# Patient Record
Sex: Female | Born: 1960 | Race: White | Hispanic: No | Marital: Single | Smoking: Never smoker
Health system: Southern US, Community
[De-identification: ages and names within clinical notes are randomized; demographics above are authoritative.]

## PROBLEM LIST (undated history)

## (undated) DIAGNOSIS — F32A Depression, unspecified: Secondary | ICD-10-CM

## (undated) DIAGNOSIS — E039 Hypothyroidism, unspecified: Secondary | ICD-10-CM

## (undated) DIAGNOSIS — K219 Gastro-esophageal reflux disease without esophagitis: Secondary | ICD-10-CM

## (undated) DIAGNOSIS — F329 Major depressive disorder, single episode, unspecified: Secondary | ICD-10-CM

## (undated) DIAGNOSIS — G43909 Migraine, unspecified, not intractable, without status migrainosus: Secondary | ICD-10-CM

## (undated) DIAGNOSIS — M1990 Unspecified osteoarthritis, unspecified site: Secondary | ICD-10-CM

## (undated) DIAGNOSIS — F419 Anxiety disorder, unspecified: Secondary | ICD-10-CM

## (undated) DIAGNOSIS — I10 Essential (primary) hypertension: Secondary | ICD-10-CM

## (undated) DIAGNOSIS — E785 Hyperlipidemia, unspecified: Secondary | ICD-10-CM

## (undated) DIAGNOSIS — E119 Type 2 diabetes mellitus without complications: Secondary | ICD-10-CM

## (undated) DIAGNOSIS — E079 Disorder of thyroid, unspecified: Secondary | ICD-10-CM

## (undated) HISTORY — DX: Depression, unspecified: F32.A

## (undated) HISTORY — DX: Anxiety disorder, unspecified: F41.9

## (undated) HISTORY — DX: Disorder of thyroid, unspecified: E07.9

## (undated) HISTORY — DX: Hyperlipidemia, unspecified: E78.5

## (undated) HISTORY — DX: Major depressive disorder, single episode, unspecified: F32.9

## (undated) HISTORY — DX: Unspecified osteoarthritis, unspecified site: M19.90

## (undated) HISTORY — PX: NO PAST SURGERIES: SHX2092

## (undated) HISTORY — DX: Gastro-esophageal reflux disease without esophagitis: K21.9

---
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
## 1983-10-22 HISTORY — PX: BREAST CYST EXCISION: SHX579

## 2005-12-19 ENCOUNTER — Inpatient Hospital Stay (HOSPITAL_COMMUNITY): Admission: AD | Admit: 2005-12-19 | Discharge: 2005-12-19 | Payer: Self-pay | Admitting: Obstetrics & Gynecology

## 2005-12-27 ENCOUNTER — Ambulatory Visit: Payer: Self-pay | Admitting: *Deleted

## 2006-04-04 ENCOUNTER — Ambulatory Visit: Payer: Self-pay | Admitting: Family Medicine

## 2007-08-31 ENCOUNTER — Encounter (INDEPENDENT_AMBULATORY_CARE_PROVIDER_SITE_OTHER): Payer: Self-pay | Admitting: Family Medicine

## 2007-08-31 ENCOUNTER — Ambulatory Visit: Payer: Self-pay | Admitting: Internal Medicine

## 2007-08-31 LAB — CONVERTED CEMR LAB
AST: 11 units/L (ref 0–37)
Albumin: 4.9 g/dL (ref 3.5–5.2)
Alkaline Phosphatase: 59 units/L (ref 39–117)
BUN: 15 mg/dL (ref 6–23)
Basophils Relative: 1 % (ref 0–1)
Eosinophils Absolute: 0.1 10*3/uL — ABNORMAL LOW (ref 0.2–0.7)
Eosinophils Relative: 1 % (ref 0–5)
Glucose, Bld: 102 mg/dL — ABNORMAL HIGH (ref 70–99)
HCT: 45 % (ref 36.0–46.0)
HDL: 35 mg/dL — ABNORMAL LOW (ref >39)
LDL Cholesterol: 179 mg/dL — ABNORMAL HIGH (ref 0–99)
MCHC: 33.6 g/dL (ref 30.0–36.0)
MCV: 95.1 fL (ref 78.0–100.0)
Neutrophils Relative %: 67 % (ref 43–77)
Platelets: 286 10*3/uL (ref 150–400)
Potassium: 4.4 meq/L (ref 3.5–5.3)
Sodium: 139 meq/L (ref 135–145)
TSH: 1.698 microintl units/mL (ref 0.350–5.50)
Total Bilirubin: 0.3 mg/dL (ref 0.3–1.2)
Total Protein: 8.6 g/dL — ABNORMAL HIGH (ref 6.0–8.3)
Triglycerides: 202 mg/dL — ABNORMAL HIGH (ref <150)
VLDL: 40 mg/dL (ref 0–40)

## 2007-09-03 ENCOUNTER — Ambulatory Visit (HOSPITAL_COMMUNITY): Admission: RE | Admit: 2007-09-03 | Discharge: 2007-09-03 | Payer: Self-pay | Admitting: Family Medicine

## 2007-09-08 ENCOUNTER — Ambulatory Visit (HOSPITAL_COMMUNITY): Admission: RE | Admit: 2007-09-08 | Discharge: 2007-09-08 | Payer: Self-pay | Admitting: Family Medicine

## 2007-09-25 ENCOUNTER — Ambulatory Visit: Payer: Self-pay | Admitting: *Deleted

## 2007-09-25 ENCOUNTER — Ambulatory Visit: Payer: Self-pay | Admitting: Family Medicine

## 2007-12-03 ENCOUNTER — Ambulatory Visit: Payer: Self-pay | Admitting: Family Medicine

## 2007-12-03 LAB — CONVERTED CEMR LAB
ALT: <8 units/L (ref 0–35)
AST: 9 units/L (ref 0–37)
Alkaline Phosphatase: 43 units/L (ref 39–117)
BUN: 11 mg/dL (ref 6–23)
Chloride: 103 meq/L (ref 96–112)
Creatinine, Ser: 0.68 mg/dL (ref 0.40–1.20)
HDL: 33 mg/dL — ABNORMAL LOW (ref >39)
LDL Cholesterol: 153 mg/dL — ABNORMAL HIGH (ref 0–99)
Total CHOL/HDL Ratio: 7.2
VLDL: 53 mg/dL — ABNORMAL HIGH (ref 0–40)

## 2008-06-23 ENCOUNTER — Emergency Department (HOSPITAL_COMMUNITY): Admission: EM | Admit: 2008-06-23 | Discharge: 2008-06-23 | Payer: Self-pay | Admitting: Emergency Medicine

## 2010-05-21 ENCOUNTER — Ambulatory Visit: Payer: Self-pay | Admitting: Internal Medicine

## 2010-09-03 ENCOUNTER — Ambulatory Visit: Payer: Self-pay | Admitting: Nurse Practitioner

## 2010-09-03 ENCOUNTER — Inpatient Hospital Stay (HOSPITAL_COMMUNITY): Admission: AD | Admit: 2010-09-03 | Discharge: 2010-09-03 | Payer: Self-pay | Admitting: Obstetrics & Gynecology

## 2010-09-27 ENCOUNTER — Ambulatory Visit: Payer: Self-pay | Admitting: Obstetrics and Gynecology

## 2010-09-27 ENCOUNTER — Other Ambulatory Visit
Admission: RE | Admit: 2010-09-27 | Discharge: 2010-09-27 | Payer: Self-pay | Source: Home / Self Care | Admitting: Obstetrics and Gynecology

## 2010-09-27 ENCOUNTER — Encounter (INDEPENDENT_AMBULATORY_CARE_PROVIDER_SITE_OTHER): Payer: Self-pay | Admitting: *Deleted

## 2010-09-27 LAB — CONVERTED CEMR LAB
FSH: 11.8 milliintl units/mL
Platelets: 294 10*3/uL (ref 150–400)
RBC: 4.05 M/uL (ref 3.87–5.11)
WBC: 4.8 10*3/uL (ref 4.0–10.5)

## 2010-10-18 ENCOUNTER — Ambulatory Visit: Payer: Self-pay | Admitting: Obstetrics & Gynecology

## 2011-01-01 LAB — CBC
HCT: 32.8 % — ABNORMAL LOW (ref 36.0–46.0)
MCH: 29.3 pg (ref 26.0–34.0)
MCV: 87.7 fL (ref 78.0–100.0)
RDW: 17.4 % — ABNORMAL HIGH (ref 11.5–15.5)
WBC: 4.3 10*3/uL (ref 4.0–10.5)

## 2011-01-01 LAB — URINALYSIS, ROUTINE W REFLEX MICROSCOPIC
Bilirubin Urine: NEGATIVE
Leukocytes, UA: NEGATIVE
Nitrite: NEGATIVE
Specific Gravity, Urine: >1.03 — ABNORMAL HIGH (ref 1.005–1.030)
Urobilinogen, UA: 1 mg/dL (ref 0.0–1.0)
pH: 6 (ref 5.0–8.0)

## 2011-01-01 LAB — WET PREP, GENITAL
Trich, Wet Prep: NONE SEEN
Yeast Wet Prep HPF POC: NONE SEEN

## 2011-01-01 LAB — URINE MICROSCOPIC-ADD ON

## 2011-02-06 ENCOUNTER — Emergency Department (HOSPITAL_COMMUNITY)
Admission: EM | Admit: 2011-02-06 | Discharge: 2011-02-06 | Disposition: A | Discharge status: Home / Self Care | Payer: Self-pay | Attending: Emergency Medicine | Admitting: Emergency Medicine

## 2011-02-06 DIAGNOSIS — T192XXA Foreign body in vulva and vagina, initial encounter: Secondary | ICD-10-CM | POA: Insufficient documentation

## 2011-02-06 DIAGNOSIS — Z79899 Other long term (current) drug therapy: Secondary | ICD-10-CM | POA: Insufficient documentation

## 2011-02-06 DIAGNOSIS — N898 Other specified noninflammatory disorders of vagina: Secondary | ICD-10-CM | POA: Insufficient documentation

## 2011-02-06 DIAGNOSIS — F3289 Other specified depressive episodes: Secondary | ICD-10-CM | POA: Insufficient documentation

## 2011-02-06 DIAGNOSIS — F329 Major depressive disorder, single episode, unspecified: Secondary | ICD-10-CM | POA: Insufficient documentation

## 2011-02-06 DIAGNOSIS — IMO0002 Reserved for concepts with insufficient information to code with codable children: Secondary | ICD-10-CM | POA: Insufficient documentation

## 2011-03-08 NOTE — Group Therapy Note (Signed)
[name], [name] NO.:  [id number]   MEDICAL RECORD NO.:  [id number]          PATIENT TYPE:  WOC   LOCATION:  [facility]                   FACILITY:  [facility]   PHYSICIAN:  Tinnie Gens, MD        DATE OF BIRTH:  1961-05-10   DATE OF SERVICE:  04/04/2006                                    CLINIC NOTE   CHIEF COMPLAINT:  Follow up vaginal bleeding.  Follow anemia.   SUBJECTIVE:  [name] is a 50 year old old patient seen in our clinic on  December 27, 2005, with a diagnosis of submucosal fibroid and vaginal bleeding  and severe iron-deficiency anemia.  At that point the patient was prescribed  Lo-Ovral 1 tablet p.o. daily for bleeding control.  The patient reports that  she has been having regular periods every month lasting 4 days with moderate  bleeding.  Her last menstrual period was March 24, 2006.  She did not report  any suprapubic or abdominal pain, no vaginal discharge.  Her level of energy  has improved.  No fatigue.  She has been also taking iron sulfate 1 p.o.  daily.   PHYSICAL EXAMINATION:  VITAL SIGNS:  Blood pressure 126/88, pulse 72,  temperature 97.4, respirations 20.  GENERAL:  Well-nourished female in no acute distress.  CARDIOVASCULAR:  S1, S2 normal, RRR, no murmurs, rubs or gallops.  CHEST:  Lungs clear to auscultation bilaterally.  ABDOMEN:  Soft, nontender, nondistended, positive bowel sounds.  No palpable  masses.  PELVIC:  Bimanual examination shows nontender cervical motion, no uterine  masses or adnexal masses palpable.   ASSESSMENT:  1.  Submucosal fibroid and menorrhagia, improved with birth control pills.      The patient will decide to continue same treatment.  2.  Iron-deficiency anemia.  The patient currently on iron supplement.  We      will check CBC today and adjust medication accordingly.     ______________________________  Tressia Danas, M.D.    ______________________________  Tinnie Gens, MD    /MEDQ  D:  04/04/2006   T:  04/04/2006  Job:  [id number]

## 2011-03-08 NOTE — Group Therapy Note (Signed)
[name], [name] NO.:  [id number]   MEDICAL RECORD NO.:  [id number]          PATIENT TYPE:  WOC   LOCATION:  [facility]                   FACILITY:  [facility]   PHYSICIAN:  Carolanne Grumbling, M.D.   DATE OF BIRTH:  10-Dec-1960   DATE OF SERVICE:                                    CLINIC NOTE   A 50 year old G2, P1-0-1-1 presents for MAU followup for heavy vaginal  bleeding.  The patient reports that she had Depo-Provera in November.  She  spotted in December and then on January 1 when she started having her period  it became very heavy, and she actually fainted per her report on January 11  and passed approximately about two 6-7 cm blood clots.  She presented then  to [facility] in March for her next Depo-Provera shot, and her  hemoglobin was 6.9 and she was sent to the MAU for transfusion.  While being  evaluated there, an ultrasound was done and it found a submucosal fibroid  measuring 2.1 x 1.7 x 2.  While she was there, the patient was also  diagnosed with bacterial vaginosis and treated with Flagyl.  As far as her  treatment for her bleeding, she was given a shot of Depo-Lupron and told  that her other option would be oral contraceptives.  Today, the patient  presents and actually thought that she was here for a surgical procedure to  remove the fibroid.   PAST MEDICAL HISTORY:  Negative for hypertension and heart disease.   PAST SURGICAL HISTORY:  She has had 2 cysts removed from her breasts, and a  dilation and curettage in 1996 for heavy vaginal bleeding.   MEDICATIONS:  Iron sulfate.   GYNECOLOGIC HISTORY:  She has been pregnant 2 times with 1 living child and  1 abortion.  Pap smear was in November and was normal.   SOCIAL HISTORY:  The patient lives with her boyfriend, her son and her  boyfriend's daughter.  She does not smoke, drink or use drugs.   PHYSICAL EXAMINATION:  VITAL SIGNS:  Weight 146.6, height 5 feet 9 inches,  blood pressure 130/80  and pulse 80.  GENERAL:  A well-developed, well-nourished female in no apparent distress.  CARDIOVASCULAR:  Regular rate and rhythm.  LUNGS:  Clear to auscultation bilaterally.  ABDOMEN:  Soft, nontender and positive bowel sounds.  EXTREMITIES:  No clubbing, cyanosis or edema.   IMPRESSION:  1.  Submucosal fibroid measuring 2.1 x 1.7 x 2 cm.  2.  Anemia.  Last hemoglobin was 6.5 with an MCV of 70.   PLAN:  The case was discussed with Dr. Mia [name], who recommended medical  treatment at this point.  She is being started on Lo/Ovral 1 tab p.o. daily.  If she has trouble with heavy bleeding, she is to return to the clinic.  In  the  meantime, she should stay on her ferrous sulfate.  Other options for  treatment include using an IUD or repeating her Depo-Provera and treating  her with oral estrogen.           ______________________________  Patricia Ana  Mayford [name], M.D.     TW/MEDQ  D:  12/27/2005  T:  12/28/2005  Job:  [id number]

## 2011-07-24 LAB — COMPREHENSIVE METABOLIC PANEL
AST: 15
Albumin: 3.8
Alkaline Phosphatase: 55
Chloride: 109
GFR calc Af Amer: >60
Potassium: 3.7
Total Bilirubin: 0.6

## 2011-07-24 LAB — URINALYSIS, ROUTINE W REFLEX MICROSCOPIC
Bilirubin Urine: NEGATIVE
Ketones, ur: NEGATIVE
Nitrite: NEGATIVE
Protein, ur: NEGATIVE
Urobilinogen, UA: 0.2

## 2011-07-24 LAB — DIFFERENTIAL
Basophils Absolute: 0
Basophils Relative: 1
Eosinophils Absolute: 0.1
Eosinophils Relative: 2
Monocytes Absolute: 0.4

## 2011-07-24 LAB — CBC
HCT: 35.5 — ABNORMAL LOW
Platelets: 288
RBC: 4.11
WBC: 5.1

## 2011-07-24 LAB — URINE MICROSCOPIC-ADD ON

## 2013-05-28 ENCOUNTER — Emergency Department (INDEPENDENT_AMBULATORY_CARE_PROVIDER_SITE_OTHER)
Admission: EM | Admit: 2013-05-28 | Discharge: 2013-05-28 | Disposition: A | Discharge status: Home / Self Care | Payer: Self-pay | Source: Home / Self Care | Attending: Family Medicine | Admitting: Family Medicine

## 2013-05-28 ENCOUNTER — Encounter (HOSPITAL_COMMUNITY): Payer: Self-pay

## 2013-05-28 DIAGNOSIS — R51 Headache: Secondary | ICD-10-CM

## 2013-05-28 MED ORDER — CYCLOBENZAPRINE HCL 5 MG PO TABS: 5.0000 mg | ORAL_TABLET | Freq: Three times a day (TID) | ORAL | Status: DC | PRN

## 2013-05-28 MED ORDER — KETOROLAC TROMETHAMINE 10 MG PO TABS: 10.0000 mg | ORAL_TABLET | Freq: Four times a day (QID) | ORAL | Status: DC | PRN

## 2013-05-28 NOTE — ED Provider Notes (Addendum)
  CSN: [id number]     Arrival date & time 05/28/13  4098 History     None    Chief Complaint  Patient presents with  . Headache   (Consider location/radiation/quality/duration/timing/severity/associated sxs/prior Treatment) Patient is a 52 y.o. female presenting with headaches. The history is provided by the patient.  Headache Pain location:  Occipital Quality:  Stabbing Radiates to:  Does not radiate Onset quality:  Gradual Duration:  2 days Progression:  Unchanged Chronicity:  Chronic Similar to prior headaches: yes   Relieved by:  Acetaminophen Ineffective treatments:  Acetaminophen Associated symptoms: no blurred vision, no dizziness, no pain, no fever, no nausea, no neck pain, no paresthesias, no tingling and no weakness     History reviewed. No pertinent past medical history. History reviewed. No pertinent past surgical history. History reviewed. No pertinent family history. History  Substance Use Topics  . Smoking status: Never Smoker   . Smokeless tobacco: Not on file  . Alcohol Use: No   OB History   Grav Para Term Preterm Abortions TAB SAB Ect Mult Living                 Review of Systems  Constitutional: Negative.  Negative for fever.  HENT: Negative for neck pain.   Eyes: Negative for blurred vision and pain.  Gastrointestinal: Negative for nausea.  Neurological: Positive for headaches. Negative for dizziness, syncope, speech difficulty, weakness and paresthesias.    Allergies  Review of patient's allergies indicates no known allergies.  Home Medications   Current Outpatient Rx  Name  Route  Sig  Dispense  Refill  . cyclobenzaprine (FLEXERIL) 5 MG tablet   Oral   Take 1 tablet (5 mg total) by mouth 3 (three) times daily as needed for muscle spasms.   30 tablet   0   . ketorolac (TORADOL) 10 MG tablet   Oral   Take 1 tablet (10 mg total) by mouth every 6 (six) hours as needed for pain.   20 tablet   0    BP 145/78  Pulse 96  Temp(Src) 98.3  F (36.8 C) (Oral)  Resp 16  SpO2 99%  LMP 04/23/2013 Physical Exam  Nursing note and vitals reviewed. Constitutional: She is oriented to person, place, and time. She appears well-developed and well-nourished.  HENT:  Head: Normocephalic.  Right Ear: External ear normal.  Left Ear: External ear normal.  Mouth/Throat: Oropharynx is clear and moist.  Eyes: Conjunctivae are normal. Pupils are equal, round, and reactive to light.  Neck: Normal range of motion. Neck supple.  Lymphadenopathy:    She has no cervical adenopathy.  Neurological: She is alert and oriented to person, place, and time. No cranial nerve deficit.  Skin: Skin is warm and dry.    ED Course   Procedures (including critical care time)  Labs Reviewed - No data to display No results found. 1. Headache disorder     MDM    Linna Hoff, MD 05/28/13 1191  Linna Hoff, MD 06/05/13 1003

## 2013-05-28 NOTE — ED Notes (Signed)
C/o she has had a migraine HA since 2012 , w/o relief from OTC medications; also wants testing for arthritis and menopause symptoms

## 2013-10-19 ENCOUNTER — Emergency Department (HOSPITAL_COMMUNITY)
Admission: EM | Admit: 2013-10-19 | Discharge: 2013-10-20 | Disposition: A | Discharge status: Home / Self Care | Payer: Self-pay | Attending: Emergency Medicine | Admitting: Emergency Medicine

## 2013-10-19 ENCOUNTER — Encounter (HOSPITAL_COMMUNITY): Payer: Self-pay | Admitting: Emergency Medicine

## 2013-10-19 ENCOUNTER — Emergency Department (HOSPITAL_COMMUNITY): Payer: Self-pay

## 2013-10-19 DIAGNOSIS — B9789 Other viral agents as the cause of diseases classified elsewhere: Secondary | ICD-10-CM

## 2013-10-19 DIAGNOSIS — Y939 Activity, unspecified: Secondary | ICD-10-CM | POA: Insufficient documentation

## 2013-10-19 DIAGNOSIS — J069 Acute upper respiratory infection, unspecified: Secondary | ICD-10-CM | POA: Insufficient documentation

## 2013-10-19 DIAGNOSIS — Z880 Allergy status to penicillin: Secondary | ICD-10-CM | POA: Insufficient documentation

## 2013-10-19 DIAGNOSIS — X503XXA Overexertion from repetitive movements, initial encounter: Secondary | ICD-10-CM | POA: Insufficient documentation

## 2013-10-19 DIAGNOSIS — I10 Essential (primary) hypertension: Secondary | ICD-10-CM | POA: Insufficient documentation

## 2013-10-19 DIAGNOSIS — Y929 Unspecified place or not applicable: Secondary | ICD-10-CM | POA: Insufficient documentation

## 2013-10-19 DIAGNOSIS — IMO0002 Reserved for concepts with insufficient information to code with codable children: Secondary | ICD-10-CM | POA: Insufficient documentation

## 2013-10-19 DIAGNOSIS — T148XXA Other injury of unspecified body region, initial encounter: Secondary | ICD-10-CM

## 2013-10-19 HISTORY — DX: Essential (primary) hypertension: I10

## 2013-10-19 NOTE — ED Notes (Signed)
Patient presents via EMS with a chief complaint of cough x 1 week with pain to her lungs when coughing. Patient denies fever, nausea/vomiting/diarrhea.

## 2013-10-20 ENCOUNTER — Emergency Department (HOSPITAL_COMMUNITY)
Admission: EM | Admit: 2013-10-20 | Discharge: 2013-10-20 | Disposition: A | Discharge status: Home / Self Care | Payer: Self-pay | Attending: Emergency Medicine | Admitting: Emergency Medicine

## 2013-10-20 ENCOUNTER — Encounter (HOSPITAL_COMMUNITY): Payer: Self-pay | Admitting: Emergency Medicine

## 2013-10-20 DIAGNOSIS — IMO0002 Reserved for concepts with insufficient information to code with codable children: Secondary | ICD-10-CM | POA: Insufficient documentation

## 2013-10-20 DIAGNOSIS — Z880 Allergy status to penicillin: Secondary | ICD-10-CM | POA: Insufficient documentation

## 2013-10-20 DIAGNOSIS — S298XXA Other specified injuries of thorax, initial encounter: Secondary | ICD-10-CM | POA: Insufficient documentation

## 2013-10-20 DIAGNOSIS — Y929 Unspecified place or not applicable: Secondary | ICD-10-CM | POA: Insufficient documentation

## 2013-10-20 DIAGNOSIS — R059 Cough, unspecified: Secondary | ICD-10-CM | POA: Insufficient documentation

## 2013-10-20 DIAGNOSIS — I10 Essential (primary) hypertension: Secondary | ICD-10-CM | POA: Insufficient documentation

## 2013-10-20 DIAGNOSIS — T148XXA Other injury of unspecified body region, initial encounter: Secondary | ICD-10-CM

## 2013-10-20 DIAGNOSIS — R05 Cough: Secondary | ICD-10-CM | POA: Insufficient documentation

## 2013-10-20 DIAGNOSIS — X58XXXA Exposure to other specified factors, initial encounter: Secondary | ICD-10-CM | POA: Insufficient documentation

## 2013-10-20 DIAGNOSIS — Y939 Activity, unspecified: Secondary | ICD-10-CM | POA: Insufficient documentation

## 2013-10-20 MED ORDER — BENZONATATE 100 MG PO CAPS
100.0000 mg | ORAL_CAPSULE | Freq: Once | ORAL | Status: AC
  Administered 2013-10-20: 100 mg via ORAL
  Filled 2013-10-20: qty 1

## 2013-10-20 MED ORDER — GUAIFENESIN ER 600 MG PO TB12: 1200.0000 mg | ORAL_TABLET | Freq: Two times a day (BID) | ORAL | Status: DC

## 2013-10-20 MED ORDER — HYDROCODONE-ACETAMINOPHEN 5-325 MG PO TABS
2.0000 | ORAL_TABLET | Freq: Once | ORAL | Status: AC
  Administered 2013-10-20: 2 via ORAL
  Filled 2013-10-20: qty 2

## 2013-10-20 MED ORDER — HYDROCODONE-ACETAMINOPHEN 5-325 MG PO TABS: 1.0000 | ORAL_TABLET | ORAL | Status: DC | PRN

## 2013-10-20 MED ORDER — CYCLOBENZAPRINE HCL 10 MG PO TABS
5.0000 mg | ORAL_TABLET | Freq: Once | ORAL | Status: AC
  Administered 2013-10-20: 5 mg via ORAL
  Filled 2013-10-20: qty 1

## 2013-10-20 MED ORDER — HYDROCOD POLST-CHLORPHEN POLST 10-8 MG/5ML PO LQCR
5.0000 mL | Freq: Once | ORAL | Status: AC
  Administered 2013-10-20: 5 mL via ORAL
  Filled 2013-10-20: qty 5

## 2013-10-20 NOTE — ED Provider Notes (Signed)
CSN: [id number]     Arrival date & time 10/19/13  2206 History   None    Chief Complaint  Patient presents with  . Cough   (Consider location/radiation/quality/duration/timing/severity/associated sxs/prior Treatment) HPI History provided by pt.   Pt has had cold symptoms, including rhinorrhea and cough, for the past 8 days.  Yesterday she developed severe pain in right mid-back when she coughed.  Pain has been constant since, but aggravated by movement and cough, and radiates around to right lower anterior chest.  Had subjective fever at onset of sx, but currently w/out fever as well as chills, headache, sore throat, SOB, body aches, generalized weakness.  Son w/ similar sx recently.  Has been taking flexeril for back pain w/out relief.   Past Medical History  Diagnosis Date  . Hypertension    History reviewed. No pertinent past surgical history. No family history on file. History  Substance Use Topics  . Smoking status: Never Smoker   . Smokeless tobacco: Not on file  . Alcohol Use: No   OB History   Grav Para Term Preterm Abortions TAB SAB Ect Mult Living                 Review of Systems  All other systems reviewed and are negative.    Allergies  Penicillins  Home Medications   Current Outpatient Rx  Name  Route  Sig  Dispense  Refill  . cyclobenzaprine (FLEXERIL) 5 MG tablet   Oral   Take 1 tablet (5 mg total) by mouth 3 (three) times daily as needed for muscle spasms.   30 tablet   0   . HYDROcodone-acetaminophen (NORCO/VICODIN) 5-325 MG per tablet   Oral   Take 1 tablet by mouth every 4 (four) hours as needed for moderate pain.   15 tablet   0    BP 171/92  Pulse 91  Temp(Src) 98.9 F (37.2 C) (Oral)  Resp 18  SpO2 97%  LMP 08/19/2013 Physical Exam  Nursing note and vitals reviewed. Constitutional: She is oriented to person, place, and time. She appears well-developed and well-nourished. No distress.  HENT:  Head: Normocephalic and atraumatic.   Posterior pharynx w/out erythema.  No tonsillar edema or exudate.  Uvula mid-line.  No trismus.    Eyes:  Normal appearance  Neck: Normal range of motion.  Cardiovascular: Normal rate and regular rhythm.   Pulmonary/Chest: Effort normal and breath sounds normal. No respiratory distress.  Pt does not appear uncomfortable w/ deep inspiration  Musculoskeletal: Normal range of motion.  Mild tenderness right thoracic soft tissues.  Pain in same location w/ passive ROM of RUE.    Lymphadenopathy:    She has no cervical adenopathy.  Neurological: She is alert and oriented to person, place, and time.  Skin: Skin is warm and dry. No rash noted.  Psychiatric: She has a normal mood and affect. Her behavior is normal.    ED Course  Procedures (including critical care time) Labs Review Labs Reviewed - No data to display Imaging Review Dg Chest 1 View  10/19/2013   CLINICAL DATA:  Persistent cough for 8 days.  EXAM: CHEST - 1 VIEW  COMPARISON:  None.  FINDINGS: The heart size and mediastinal contours are within normal limits. Both lungs are clear. The visualized skeletal structures are unremarkable.  IMPRESSION: No active disease.   Electronically Signed   By: Burman Nieves M.D.   On: 10/19/2013 23:01    EKG Interpretation   None  MDM   1. Muscle strain   2. Viral respiratory illness    52yo F presents w/ R thoracic back pain that started when she coughed yesterday.  Has had cough and rhinorrhea x 8 days.  CXR neg for pna.  Afebrile, no respiratory distress, nml breath sounds, back pain reproducible w/ palpation and ROM of RUE on exam.  Suspect muscle strain and viral URI.  Pt prescribed short course of vicodin for pain and cough suppression.  Recommended NSAID, heat/ice and rest as well.  Return precautions discussed. 12:42 AM     Otilio Miu, PA-C 10/20/13 [phone]

## 2013-10-20 NOTE — ED Provider Notes (Signed)
CSN: [id number]     Arrival date & time 10/20/13  2153 History   First MD Initiated Contact with Patient 10/20/13 2214     Chief Complaint  Patient presents with  . Back Pain  . Spasms   HPI  History provided by patient. Patient is a 52 year old female with history of hypertension who presents with worsened back and right chest wall pain. Patient has been having a cough for several days. She was seen yesterday in the emergency department with unremarkable chest x-ray. She was having some chest wall pains at that time related to coughing. Today she has had significant coughing fits and developed worsened pain and tightness along her right back and chest wall. She denies having any shortness of breath. Denies any fevers, chills or sweats. She was given prescriptions for pain medicine she has been unable to afford these at this time. She has not used any other treatment for symptoms. No other aggravating or alleviating factors. No other associated symptoms.    Past Medical History  Diagnosis Date  . Hypertension    History reviewed. No pertinent past surgical history. No family history on file. History  Substance Use Topics  . Smoking status: Never Smoker   . Smokeless tobacco: Not on file  . Alcohol Use: No   OB History   Grav Para Term Preterm Abortions TAB SAB Ect Mult Living                 Review of Systems  Constitutional: Negative for fever and chills.  Respiratory: Positive for cough. Negative for shortness of breath.   Cardiovascular: Positive for chest pain.  Musculoskeletal: Positive for back pain.  All other systems reviewed and are negative.    Allergies  Penicillins  Home Medications   Current Outpatient Rx  Name  Route  Sig  Dispense  Refill  . cyclobenzaprine (FLEXERIL) 5 MG tablet   Oral   Take 1 tablet (5 mg total) by mouth 3 (three) times daily as needed for muscle spasms.   30 tablet   0   . guaiFENesin (MUCINEX) 600 MG 12 hr tablet   Oral  Take 2 tablets (1,200 mg total) by mouth 2 (two) times daily.   40 tablet   0   . HYDROcodone-acetaminophen (NORCO/VICODIN) 5-325 MG per tablet   Oral   Take 1 tablet by mouth every 4 (four) hours as needed for moderate pain.   15 tablet   0    BP 158/86  Pulse 88  Temp(Src) 98.2 F (36.8 C) (Oral)  Resp 18  SpO2 99%  LMP 08/19/2013 Physical Exam  Nursing note and vitals reviewed. Constitutional: She is oriented to person, place, and time. She appears well-developed and well-nourished. No distress.  HENT:  Head: Normocephalic.  Cardiovascular: Normal rate and regular rhythm.   Pulmonary/Chest: Effort normal and breath sounds normal. No respiratory distress. She has no wheezes. She has no rales.      Tenderness over the right chest chest wall and back. No deformities. No swelling. Skin normal. No bruising. No crepitus.  Abdominal: Soft. There is no tenderness.  Neurological: She is alert and oriented to person, place, and time.  Skin: Skin is warm and dry. No rash noted.  Psychiatric: She has a normal mood and affect. Her behavior is normal.    ED Course  Procedures   Imaging Review Dg Chest 1 View  10/19/2013   CLINICAL DATA:  Persistent cough for 8 days.  EXAM: CHEST -  1 VIEW  COMPARISON:  None.  FINDINGS: The heart size and mediastinal contours are within normal limits. Both lungs are clear. The visualized skeletal structures are unremarkable.  IMPRESSION: No active disease.   Electronically Signed   By: Burman Nieves M.D.   On: 10/19/2013 23:01      MDM   1. Muscle strain   2. Cough        Angus Seller, PA-C 10/20/13 [id number]

## 2013-10-20 NOTE — ED Provider Notes (Signed)
Medical screening examination/treatment/procedure(s) were performed by non-physician practitioner and as supervising physician I was immediately available for consultation/collaboration.   Nilson Tabora, MD 10/20/13 0829 

## 2013-10-20 NOTE — ED Notes (Signed)
Pt was tx here last night for back pain and muscle spasms.  Pt has prescription that has not been filled b/c, per ems, they do not have money to buy the medicine.

## 2013-10-21 NOTE — ED Provider Notes (Signed)
Medical screening examination/treatment/procedure(s) were performed by non-physician practitioner and as supervising physician I was immediately available for consultation/collaboration.  EKG Interpretation   None         Shaquina Gillham M Annelisa Ryback, MD 10/21/13 0043 

## 2015-05-20 ENCOUNTER — Encounter (HOSPITAL_COMMUNITY): Payer: Self-pay | Admitting: Emergency Medicine

## 2015-05-20 ENCOUNTER — Emergency Department (HOSPITAL_COMMUNITY)
Admission: EM | Admit: 2015-05-20 | Discharge: 2015-05-20 | Disposition: A | Discharge status: Home / Self Care | Payer: Self-pay | Attending: Emergency Medicine | Admitting: Emergency Medicine

## 2015-05-20 ENCOUNTER — Emergency Department (HOSPITAL_COMMUNITY): Payer: Self-pay

## 2015-05-20 DIAGNOSIS — I10 Essential (primary) hypertension: Secondary | ICD-10-CM | POA: Insufficient documentation

## 2015-05-20 DIAGNOSIS — N939 Abnormal uterine and vaginal bleeding, unspecified: Secondary | ICD-10-CM | POA: Insufficient documentation

## 2015-05-20 DIAGNOSIS — Z880 Allergy status to penicillin: Secondary | ICD-10-CM | POA: Insufficient documentation

## 2015-05-20 LAB — URINALYSIS W MICROSCOPIC (NOT AT ARMC)
Bilirubin Urine: NEGATIVE
GLUCOSE, UA: NEGATIVE mg/dL
KETONES UR: NEGATIVE mg/dL
LEUKOCYTES UA: NEGATIVE
NITRITE: NEGATIVE
Protein, ur: NEGATIVE mg/dL
Specific Gravity, Urine: 1.011 (ref 1.005–1.030)
UROBILINOGEN UA: 0.2 mg/dL (ref 0.0–1.0)
pH: 7 (ref 5.0–8.0)

## 2015-05-20 LAB — WET PREP, GENITAL
TRICH WET PREP: NONE SEEN
WBC, Wet Prep HPF POC: NONE SEEN
Yeast Wet Prep HPF POC: NONE SEEN

## 2015-05-20 LAB — PREGNANCY, URINE: PREG TEST UR: NEGATIVE

## 2015-05-20 NOTE — ED Notes (Signed)
Pt. Stated I thought I was finished with my periods and then I started spotting and bleeding on July 14 and it gets worse and worse.

## 2015-05-20 NOTE — ED Provider Notes (Signed)
CSN: [id number]     Arrival date & time 05/20/15  1015 History   First MD Initiated Contact with Patient 05/20/15 1122     Chief Complaint  Patient presents with  . Vaginal Bleeding     (Consider location/radiation/quality/duration/timing/severity/associated sxs/prior Treatment) HPI [name] is a 54 y.o. female presenting to the emergency department today with complaint of vaginal bleeding.  States that her LMP was sometime in August 2015 and prior to that time had been having regular periods .  Beginning around July 14, pt states that she noticed some spotting.  Last weekend, patient reports that her bleeding became more heavy and states that she is going through approximately one pad per day.  Associated symptoms include some abdominal cramping that comes and goes that patient feels are similar to menstrual type cramping.  Patient denies passing any large clots with her bleeding.  Patient does not recall when her last pap smear was and does not currently follow with an Ob/Gyn.  She is not on any blood thinners, denies any overt bleeding from other sites.  She is afebrile, hemodynamically stable, not tachycardic.  Does report some fatigue lately but denies any chest pain, shortness of breath.  Past Medical History  Diagnosis Date  . Hypertension    History reviewed. No pertinent past surgical history. No family history on file. History  Substance Use Topics  . Smoking status: Never Smoker   . Smokeless tobacco: Not on file  . Alcohol Use: No   OB History    No data available     Review of Systems  Constitutional: Positive for fatigue. Negative for fever and chills.  Respiratory: Negative for cough and shortness of breath.   Cardiovascular: Negative for chest pain and leg swelling.  Gastrointestinal: Negative for nausea, vomiting and diarrhea.       Intermittment abdominal cramping  Genitourinary: Positive for vaginal bleeding. Negative for dysuria, flank pain and  difficulty urinating.  Neurological: Negative for dizziness, syncope and light-headedness.  All other systems reviewed and are negative.     Allergies  Penicillins  Home Medications   Prior to Admission medications   Medication Sig Start Date End Date Taking? Authorizing Provider  cyclobenzaprine (FLEXERIL) 5 MG tablet Take 1 tablet (5 mg total) by mouth 3 (three) times daily as needed for muscle spasms. 05/28/13   Billy Fischer, MD  guaiFENesin (MUCINEX) 600 MG 12 hr tablet Take 2 tablets (1,200 mg total) by mouth 2 (two) times daily. 10/20/13   Hazel Sams, PA-C  HYDROcodone-acetaminophen (NORCO/VICODIN) 5-325 MG per tablet Take 1 tablet by mouth every 4 (four) hours as needed for moderate pain. 10/20/13   Catherine Schinlever, PA-C   BP 143/78 mmHg  Pulse 84  Temp(Src) 98.7 F (37.1 C) (Oral)  Resp 18  Ht 5' 7.5" (1.715 m)  Wt 168 lb (76.204 kg)  BMI 25.91 kg/m2  SpO2 97% Physical Exam  Constitutional: She appears well-developed and well-nourished. No distress.  HENT:  Head: Normocephalic and atraumatic.  Eyes: EOM are normal.  Cardiovascular: Normal rate and regular rhythm.   Pulmonary/Chest: Effort normal and breath sounds normal.  Abdominal: Soft. There is no tenderness.  Genitourinary: Cervix exhibits no discharge. There is bleeding in the vagina. No vaginal discharge found.    ED Course  Procedures (including critical care time) Labs Review Labs Reviewed  WET PREP, GENITAL - Abnormal; Notable for the following:    Clue Cells Wet Prep HPF POC FEW (*)  All other components within normal limits  URINALYSIS W MICROSCOPIC - Abnormal; Notable for the following:    Hgb urine dipstick LARGE (*)    Squamous Epithelial / LPF FEW (*)    All other components within normal limits  PREGNANCY, URINE  RPR  HIV ANTIBODY (ROUTINE TESTING)  GC/CHLAMYDIA PROBE AMP ([place]) NOT AT [facility]    Imaging Review US Transvaginal Non-ob  05/20/2015   CLINICAL DATA:  Vaginal  bleeding for 2 weeks.  EXAM: TRANSABDOMINAL AND TRANSVAGINAL ULTRASOUND OF PELVIS  TECHNIQUE: Both transabdominal and transvaginal ultrasound examinations of the pelvis were performed. Transabdominal technique was performed for global imaging of the pelvis including uterus, ovaries, adnexal regions, and pelvic cul-de-sac. It was necessary to proceed with endovaginal exam following the transabdominal exam to visualize the uterus, endometrium, ovaries and adnexa .  COMPARISON:  09/03/2010  FINDINGS: Uterus  Measurements: 10.1 x 6.1 x 8.0 cm. Multiple uterine fibroids, the largest an exophytic fundal fibroid measuring up to 2.2 cm. There is an posterior submucosal 1.4 cm fibroid. Uterus is retroverted.  Endometrium  Thickness: 17 mm in thickness. Hypoechoic structure centrally within the endometrium measures 1.9 x 1.4 x 1.2 cm. This could reflect a pedunculated fibroid or polyp.  Right ovary  Measurements: 4.3 x 3.2 x 3.4 cm. Simple appearing 3.1 cm cyst. Complex cyst with internal echoes measures 2.3 cm, likely hemorrhagic follicle.  Left ovary  Measurements: 2.8 x 1.9 x 1.9 cm. Normal appearance/no adnexal mass.  Other findings  Trace free fluid in the pelvis  IMPRESSION: Multiple uterine fibroids. There is a 1.9 cm hypoechoic lesions centrally within the endometrium which could reflect a pedunculated fibroid or endometrial polyp.   Electronically Signed   By: Rolm Baptise M.D.   On: 05/20/2015 13:39   US Pelvis Complete  05/20/2015   CLINICAL DATA:  Vaginal bleeding for 2 weeks.  EXAM: TRANSABDOMINAL AND TRANSVAGINAL ULTRASOUND OF PELVIS  TECHNIQUE: Both transabdominal and transvaginal ultrasound examinations of the pelvis were performed. Transabdominal technique was performed for global imaging of the pelvis including uterus, ovaries, adnexal regions, and pelvic cul-de-sac. It was necessary to proceed with endovaginal exam following the transabdominal exam to visualize the uterus, endometrium, ovaries and adnexa  .  COMPARISON:  09/03/2010  FINDINGS: Uterus  Measurements: 10.1 x 6.1 x 8.0 cm. Multiple uterine fibroids, the largest an exophytic fundal fibroid measuring up to 2.2 cm. There is an posterior submucosal 1.4 cm fibroid. Uterus is retroverted.  Endometrium  Thickness: 17 mm in thickness. Hypoechoic structure centrally within the endometrium measures 1.9 x 1.4 x 1.2 cm. This could reflect a pedunculated fibroid or polyp.  Right ovary  Measurements: 4.3 x 3.2 x 3.4 cm. Simple appearing 3.1 cm cyst. Complex cyst with internal echoes measures 2.3 cm, likely hemorrhagic follicle.  Left ovary  Measurements: 2.8 x 1.9 x 1.9 cm. Normal appearance/no adnexal mass.  Other findings  Trace free fluid in the pelvis  IMPRESSION: Multiple uterine fibroids. There is a 1.9 cm hypoechoic lesions centrally within the endometrium which could reflect a pedunculated fibroid or endometrial polyp.   Electronically Signed   By: Rolm Baptise M.D.   On: 05/20/2015 13:39     EKG Interpretation None      MDM   Final diagnoses:  Vaginal bleeding   54 yo female with vaginal bleeding, LMP August 2015.  Pt reports spotting that started around July 14 with increased amount of bleeding up until her visit to the ED.  States going through  about one pad per day.  Denies any over blood loss from other sources.  Pt is hemodynamically stable and not tachycardic.  Pelvic exam performed with presence of blood in vaginal vault.  No other discharge noted.  Mild tenderness noted by patient on bimanual exam.  Will check urine pregnancy, RPR, GC/CT, wet prep, urinalysis, HIV, and pelvic ultrasound.  Urine pregnancy negative.  Urinalysis showing large amount blood, negative nitrites and leukocytes.  Pelvic ultrasound with multiple uterine fibroids.  Patient stable for discharge.  Will have patient follow up with Ob/gyn as an outpatient.     Jule Ser, DO 05/20/15 [place], MD 05/22/15 [id number]

## 2015-05-20 NOTE — ED Notes (Signed)
MD Linker at the bedside.  

## 2015-05-20 NOTE — Progress Notes (Signed)
CM spoke with pt who confirms self pay [facility] resident with no pcp.  CM discussed and provided written information for self pay pcps, discussed the importance of pcp vs EDP services for f/u care, www.needymeds.org, www.goodrx.com, discounted pharmacies and other [employer] such as [employer] [facility] [employer], affordable care act,  [place] med assist, financial assistance, self pay dental services, [place] med assist, DSS and  health department  Reviewed resources for [employer] self pay pcps like [facility], family medicine at [employer], [facility], [facility], local urgent care centers, [facility], [facility] family practice, general medical clinics, family services of the [place], [facility] urgent care plus others, medication resources, [facility] out patient pharmacies and housing Pt voiced understanding and appreciation of resources provided   Provided [facility] contact information Pt agreed to a referral Cm completed referral Pt to be contact by [facility] clinical liaison

## 2015-05-20 NOTE — Discharge Instructions (Signed)

## 2015-05-20 NOTE — ED Notes (Signed)
Patient getting dressed.

## 2015-05-20 NOTE — ED Notes (Signed)
Returned from U/S

## 2015-05-21 LAB — HIV ANTIBODY (ROUTINE TESTING W REFLEX): HIV Screen 4th Generation wRfx: NONREACTIVE

## 2015-05-21 LAB — RPR: RPR: NONREACTIVE

## 2015-05-22 LAB — GC/CHLAMYDIA PROBE AMP (~~LOC~~) NOT AT ARMC
CHLAMYDIA, DNA PROBE: NEGATIVE
NEISSERIA GONORRHEA: NEGATIVE

## 2015-08-18 ENCOUNTER — Ambulatory Visit: Payer: Self-pay | Attending: Internal Medicine

## 2015-09-06 ENCOUNTER — Ambulatory Visit: Payer: Self-pay

## 2015-09-18 ENCOUNTER — Ambulatory Visit: Payer: Self-pay | Admitting: Family Medicine

## 2015-09-28 ENCOUNTER — Ambulatory Visit (INDEPENDENT_AMBULATORY_CARE_PROVIDER_SITE_OTHER): Payer: Self-pay | Admitting: Family Medicine

## 2015-09-28 ENCOUNTER — Encounter: Payer: Self-pay | Admitting: Family Medicine

## 2015-09-28 VITALS — BP 158/91 | HR 99 | Temp 98.2°F | Ht 67.5 in | Wt 193.0 lb

## 2015-09-28 DIAGNOSIS — M5440 Lumbago with sciatica, unspecified side: Secondary | ICD-10-CM

## 2015-09-28 DIAGNOSIS — I10 Essential (primary) hypertension: Secondary | ICD-10-CM

## 2015-09-28 DIAGNOSIS — Z23 Encounter for immunization: Secondary | ICD-10-CM

## 2015-09-28 DIAGNOSIS — M545 Low back pain: Secondary | ICD-10-CM

## 2015-09-28 DIAGNOSIS — M5430 Sciatica, unspecified side: Secondary | ICD-10-CM

## 2015-09-28 LAB — CBC WITH DIFFERENTIAL/PLATELET
BASOS ABS: 0.1 10*3/uL (ref 0.0–0.1)
Basophils Relative: 1 % (ref 0–1)
EOS PCT: 1 % (ref 0–5)
Eosinophils Absolute: 0.1 10*3/uL (ref 0.0–0.7)
HCT: 43.5 % (ref 36.0–46.0)
Hemoglobin: 14.9 g/dL (ref 12.0–15.0)
LYMPHS ABS: 2.1 10*3/uL (ref 0.7–4.0)
Lymphocytes Relative: 32 % (ref 12–46)
MCH: 31.1 pg (ref 26.0–34.0)
MCHC: 34.3 g/dL (ref 30.0–36.0)
MCV: 90.8 fL (ref 78.0–100.0)
MPV: 9.6 fL (ref 8.6–12.4)
Monocytes Absolute: 0.4 10*3/uL (ref 0.1–1.0)
Monocytes Relative: 6 % (ref 3–12)
NEUTROS PCT: 60 % (ref 43–77)
Neutro Abs: 4 10*3/uL (ref 1.7–7.7)
PLATELETS: 275 10*3/uL (ref 150–400)
RBC: 4.79 MIL/uL (ref 3.87–5.11)
RDW: 14.2 % (ref 11.5–15.5)
WBC: 6.6 10*3/uL (ref 4.0–10.5)

## 2015-09-28 LAB — LIPID PANEL
CHOLESTEROL: 302 mg/dL — AB (ref 125–200)
HDL: 33 mg/dL — AB (ref >=46)
LDL CALC: 208 mg/dL — AB (ref <130)
TRIGLYCERIDES: 304 mg/dL — AB (ref <150)
Total CHOL/HDL Ratio: 9.2 Ratio — ABNORMAL HIGH (ref <=5.0)
VLDL: 61 mg/dL — AB (ref <30)

## 2015-09-28 LAB — COMPLETE METABOLIC PANEL WITH GFR
ALT: 26 U/L (ref 6–29)
AST: 19 U/L (ref 10–35)
Albumin: 4.1 g/dL (ref 3.6–5.1)
Alkaline Phosphatase: 75 U/L (ref 33–130)
BUN: 15 mg/dL (ref 7–25)
CALCIUM: 9.6 mg/dL (ref 8.6–10.4)
CO2: 26 mmol/L (ref 20–31)
CREATININE: 0.77 mg/dL (ref 0.50–1.05)
Chloride: 102 mmol/L (ref 98–110)
GFR, EST NON AFRICAN AMERICAN: 88 mL/min (ref >=60)
Glucose, Bld: 123 mg/dL — ABNORMAL HIGH (ref 65–99)
Potassium: 4.2 mmol/L (ref 3.5–5.3)
Sodium: 137 mmol/L (ref 135–146)
Total Bilirubin: 0.3 mg/dL (ref 0.2–1.2)
Total Protein: 8.4 g/dL — ABNORMAL HIGH (ref 6.1–8.1)

## 2015-09-28 LAB — TSH: TSH: 3.071 u[IU]/mL (ref 0.350–4.500)

## 2015-09-28 MED ORDER — HYDROCHLOROTHIAZIDE 25 MG PO TABS: 25.0000 mg | ORAL_TABLET | Freq: Every day | ORAL | Status: DC

## 2015-09-28 MED ORDER — MELOXICAM 15 MG PO TABS: 15.0000 mg | ORAL_TABLET | Freq: Every day | ORAL | Status: DC

## 2015-09-28 MED ORDER — NAPROXEN 500 MG PO TABS: 500.0000 mg | ORAL_TABLET | Freq: Two times a day (BID) | ORAL | Status: DC

## 2015-09-28 NOTE — Progress Notes (Signed)
Patient ID: [name], female   DOB: 1961-03-01, 54 y.o.   MRN: [id number]   [name], is a 54 y.o. female  [id number]  [id number]  DOB - [birth year]  CC:  Chief Complaint  Patient presents with  . New Evaluation  . Back Pain    wants disability forms filled out  . Headache    couple of months       HPI: [name] is a 55 y.o. female here to establish care. She has previously been a patient at the [facility]. She reports not being seen there for sometime. She has a history of hypertension but is not currently being treated. Her primary reason for being here today is to request a MRI of her spine to aid in her attempt to get disability. She also complains of almost daily headaches. She describes these as like a tight band across the forehead. She get little if any relief with Aleve. Her back pain is in the lumbar area with radiation into the legs. Walking or standing for prolonged periods are aggravating factors, lying down or sitting down provide some relief.  She complains of having some shortness of breath with exertion since gaining weight.   Allergies  Allergen Reactions  . Penicillins Nausea And Vomiting   Past Medical History  Diagnosis Date  . Hypertension    No current outpatient prescriptions on file prior to visit.   No current facility-administered medications on file prior to visit.   No family history on file. Social History   Social History  . Marital Status: Single    Spouse Name: N/A  . Number of Children: N/A  . Years of Education: N/A   Occupational History  . Not on file.   Social History Main Topics  . Smoking status: Never Smoker   . Smokeless tobacco: Never Used  . Alcohol Use: No  . Drug Use: No  . Sexual Activity:    Partners: Male   Other Topics Concern  . Not on file   Social History Narrative    Review of Systems: Constitutional: Negative for fever, chills, appetite change, weight loss. Positive  for Fatigue, hot flashes, sweating Skin: Negative for rashes or lesions of concern. HENT: Negative for ear pain, ear discharge.nose bleeds Eyes: Negative for pain, discharge, redness, itching and visual disturbance.Glasses for vision Neck: Negative for pain, stiffness Respiratory:Negative  for cough,. Positive for hortness of breath and wheezing with exertion. Cardiovascular: Negative for chest pain, palpitations and leg swelling. Gastrointestinal: Negative for abdominal pain, nausea, vomiting, diarrhea, constipations Genitourinary: Negative for dysuria, urgency, frequency, hematuria,  Musculoskeletal: Positive for low back pain with radicular pain into the legs. joint pain, joint  swelling, and gait problem.Negative for weakness. Neurological: Negative for dizziness, tremors, seizures, syncope,   light-headedness, numbness. Positive for  headaches.  Hematological: Negative for easy bruising or bleeding Psychiatric/Behavioral: Positive for depression, anxiety, related to physical symtpoms.   Objective:   Filed Vitals:   09/28/15 1355  BP: 158/91  Pulse: 99  Temp: 98.2 F (36.8 C)    Physical Exam: Constitutional: Patient appears well-developed and well-nourished. No distress. HENT: Normocephalic, atraumatic, External right and left ear normal. Oropharynx is clear and moist.  Eyes: Conjunctivae and EOM are normal. PERRLA, no scleral icterus. Neck: Normal ROM. Neck supple. No lymphadenopathy, No thyromegaly. CVS: RRR, S1/S2 +, no murmurs, no gallops, no rubs Pulmonary: Effort and breath sounds normal, no stridor, rhonchi, wheezes, rales.  Abdominal: Soft. Normoactive BS,, no distension, tenderness,  rebound or guarding.  Musculoskeletal: Normal range of motion. No edema and no tenderness.  Neuro: Alert.Normal muscle tone coordination. Non-focal Skin: Skin is warm and dry. No rash noted. Not diaphoretic. No erythema. No pallor. Psychiatric: Normal mood and affect. Behavior, judgment,  thought content normal.  Lab Results  Component Value Date   WBC 4.8 09/27/2010   HGB 11.1* 09/27/2010   HCT 34.4* 09/27/2010   MCV 84.9 09/27/2010   PLT 294 09/27/2010   Lab Results  Component Value Date   CREATININE 0.66 06/23/2008   BUN 8 06/23/2008   NA 138 06/23/2008   K 3.7 06/23/2008   CL 109 06/23/2008   CO2 26 06/23/2008    No results found for: HGBA1C Lipid Panel     Component Value Date/Time   CHOL 239* 12/03/2007 2048   TRIG 267* 12/03/2007 2048   HDL 33* 12/03/2007 2048   CHOLHDL 7.2 Ratio 12/03/2007 2048   VLDL 53* 12/03/2007 2048   LDLCALC 153* 12/03/2007 2048       Assessment and plan:   1. Essential hypertension  - COMPLETE METABOLIC PANEL WITH GFR - CBC w/Diff - TSH - Lipid panel - HIV antibody (with reflex) - Vitamin D 1,25 dihydroxy - 2. Need for vaccination   Flu Vaccine QUAD 36+ mos PF IM (Fluarix & Fluzone Quad PF)  3. Back Pain -naproxen 500 bid, #60, one po bid, with no RF  4. Concerns about disability -I have explained that she will need to be evaluated by a certified disability doctor. I have suggested that she talk with DSS about this. I have explained her orange card is not going to cover an MRI for this reason.  Return in about 1 month (around 10/29/2015).  The patient was given clear instructions to go to ER or return to medical center if symptoms don't improve, worsen or new problems develop. The patient verbalized understanding.    Micheline Chapman FNP  09/28/2015, 3:37 PM

## 2015-09-28 NOTE — Patient Instructions (Signed)
Take hydrochlorizide 25 mg once a day for hypertension Try excedrin for headaches.  Hopefully getting your BP down will help your headaches Check with DSS about disability doctor Take Meloxicam once a day for back pain.

## 2015-09-29 LAB — HIV ANTIBODY (ROUTINE TESTING W REFLEX): HIV 1&2 Ab, 4th Generation: NONREACTIVE

## 2015-10-01 LAB — VITAMIN D 1,25 DIHYDROXY
Vitamin D 1, 25 (OH)2 Total: 61 pg/mL (ref 18–72)
Vitamin D2 1, 25 (OH)2: <8 pg/mL
Vitamin D3 1, 25 (OH)2: 61 pg/mL

## 2015-10-02 ENCOUNTER — Ambulatory Visit: Payer: Self-pay

## 2015-10-02 ENCOUNTER — Other Ambulatory Visit (HOSPITAL_COMMUNITY): Payer: Self-pay | Admitting: Family Medicine

## 2015-10-02 MED ORDER — PRAVASTATIN SODIUM 40 MG PO TABS: 40.0000 mg | ORAL_TABLET | Freq: Every day | ORAL | Status: DC

## 2015-10-30 ENCOUNTER — Ambulatory Visit: Payer: Self-pay | Admitting: Family Medicine

## 2015-11-27 ENCOUNTER — Ambulatory Visit: Payer: Self-pay | Admitting: Family Medicine

## 2016-04-22 ENCOUNTER — Encounter (HOSPITAL_COMMUNITY): Payer: Self-pay

## 2016-04-22 ENCOUNTER — Emergency Department (HOSPITAL_COMMUNITY)
Admission: EM | Admit: 2016-04-22 | Discharge: 2016-04-22 | Disposition: A | Discharge status: Home / Self Care | Payer: Medicaid Other | Attending: Emergency Medicine | Admitting: Emergency Medicine

## 2016-04-22 ENCOUNTER — Emergency Department (HOSPITAL_COMMUNITY): Payer: Medicaid Other

## 2016-04-22 DIAGNOSIS — N950 Postmenopausal bleeding: Secondary | ICD-10-CM

## 2016-04-22 DIAGNOSIS — N858 Other specified noninflammatory disorders of uterus: Secondary | ICD-10-CM

## 2016-04-22 DIAGNOSIS — Z7982 Long term (current) use of aspirin: Secondary | ICD-10-CM | POA: Insufficient documentation

## 2016-04-22 DIAGNOSIS — N939 Abnormal uterine and vaginal bleeding, unspecified: Secondary | ICD-10-CM | POA: Insufficient documentation

## 2016-04-22 DIAGNOSIS — I10 Essential (primary) hypertension: Secondary | ICD-10-CM | POA: Insufficient documentation

## 2016-04-22 LAB — WET PREP, GENITAL
Clue Cells Wet Prep HPF POC: NONE SEEN
Sperm: NONE SEEN
Trich, Wet Prep: NONE SEEN
WBC, Wet Prep HPF POC: NONE SEEN
YEAST WET PREP: NONE SEEN

## 2016-04-22 LAB — CBC WITH DIFFERENTIAL/PLATELET
BASOS PCT: 0 %
Basophils Absolute: 0 10*3/uL (ref 0.0–0.1)
Eosinophils Absolute: 0.1 10*3/uL (ref 0.0–0.7)
Eosinophils Relative: 1 %
HEMATOCRIT: 44.2 % (ref 36.0–46.0)
Hemoglobin: 15.1 g/dL — ABNORMAL HIGH (ref 12.0–15.0)
Lymphocytes Relative: 25 %
Lymphs Abs: 2.1 10*3/uL (ref 0.7–4.0)
MCH: 32.1 pg (ref 26.0–34.0)
MCHC: 34.2 g/dL (ref 30.0–36.0)
MCV: 94 fL (ref 78.0–100.0)
MONO ABS: 0.5 10*3/uL (ref 0.1–1.0)
Monocytes Relative: 6 %
NEUTROS ABS: 5.6 10*3/uL (ref 1.7–7.7)
Neutrophils Relative %: 68 %
Platelets: 256 10*3/uL (ref 150–400)
RBC: 4.7 MIL/uL (ref 3.87–5.11)
RDW: 13.7 % (ref 11.5–15.5)
WBC: 8.3 10*3/uL (ref 4.0–10.5)

## 2016-04-22 LAB — BASIC METABOLIC PANEL
ANION GAP: 8 (ref 5–15)
BUN: 9 mg/dL (ref 6–20)
CALCIUM: 9.9 mg/dL (ref 8.9–10.3)
CO2: 24 mmol/L (ref 22–32)
Chloride: 107 mmol/L (ref 101–111)
Creatinine, Ser: 0.86 mg/dL (ref 0.44–1.00)
GFR calc non Af Amer: >60 mL/min (ref >60)
Glucose, Bld: 114 mg/dL — ABNORMAL HIGH (ref 65–99)
POTASSIUM: 4.2 mmol/L (ref 3.5–5.1)
SODIUM: 139 mmol/L (ref 135–145)

## 2016-04-22 LAB — I-STAT BETA HCG BLOOD, ED (MC, WL, AP ONLY): I-stat hCG, quantitative: <5 m[IU]/mL (ref <5)

## 2016-04-22 LAB — URINALYSIS, ROUTINE W REFLEX MICROSCOPIC
Bilirubin Urine: NEGATIVE
Glucose, UA: NEGATIVE mg/dL
Ketones, ur: NEGATIVE mg/dL
Nitrite: NEGATIVE
Protein, ur: NEGATIVE mg/dL
Specific Gravity, Urine: 1.012 (ref 1.005–1.030)
pH: 6 (ref 5.0–8.0)

## 2016-04-22 LAB — URINE MICROSCOPIC-ADD ON

## 2016-04-22 LAB — ABO/RH: ABO/RH(D): O POS

## 2016-04-22 LAB — TYPE AND SCREEN
ABO/RH(D): O POS
Antibody Screen: NEGATIVE

## 2016-04-22 MED ORDER — SODIUM CHLORIDE 0.9 % IV BOLUS (SEPSIS)
1000.0000 mL | Freq: Once | INTRAVENOUS | Status: AC
Start: 2016-04-22 — End: 2016-04-22
  Administered 2016-04-22: 1000 mL via INTRAVENOUS

## 2016-04-22 NOTE — Discharge Instructions (Signed)
Read the information below.  You may return to the Emergency Department at any time for worsening condition or any new symptoms that concern you.  If you develop high fevers, worsening abdominal pain, uncontrolled vomiting, or are unable to tolerate fluids by mouth, return to the ER for a recheck.   If you develop uncontrolled heavy bleeding or feel like you are going to pass out, go directly to [facility] for a recheck.   Postmenopausal Bleeding Postmenopausal bleeding is any bleeding a woman has after she has entered into menopause. Menopause is the end of a woman's fertile years. After menopause, a woman no longer ovulates or has menstrual periods.  Postmenopausal bleeding can be caused by various things. Any type of postmenopausal bleeding, even if it appears to be a typical menstrual period, is concerning. This should be evaluated by your health care provider. Any treatment will depend on the cause of the bleeding. HOME CARE INSTRUCTIONS Monitor your condition for any changes. The following actions may help to alleviate any discomfort you are experiencing:  Avoid the use of tampons and douches as directed by your health care provider.  Change your pads frequently.  Get regular pelvic exams and Pap tests.  Keep all follow-up appointments for diagnostic tests as directed by your health care provider. SEEK MEDICAL CARE IF:   Your bleeding lasts more than 1 week.  You have abdominal pain.  You have bleeding with sexual intercourse. SEEK IMMEDIATE MEDICAL CARE IF:   You have a fever, chills, headache, dizziness, muscle aches, and bleeding.  You have severe pain with bleeding.  You are passing blood clots.  You have bleeding and need more than 1 pad an hour.  You feel faint. MAKE SURE YOU:  Understand these instructions.  Will watch your condition.  Will get help right away if you are not doing well or get worse.   This information is not intended to replace advice  given to you by your health care provider. Make sure you discuss any questions you have with your health care provider.   Document Released: 01/15/2006 Document Revised: 07/28/2013 Document Reviewed: 05/06/2013 Elsevier Interactive Patient Education 2016 Elsevier Inc.   Abnormal Uterine Bleeding Abnormal uterine bleeding can affect women at various stages in life, including teenagers, women in their reproductive years, pregnant women, and women who have reached menopause. Several kinds of uterine bleeding are considered abnormal, including:  Bleeding or spotting between periods.   Bleeding after sexual intercourse.   Bleeding that is heavier or more than normal.   Periods that last longer than usual.  Bleeding after menopause.  Many cases of abnormal uterine bleeding are minor and simple to treat, while others are more serious. Any type of abnormal bleeding should be evaluated by your health care provider. Treatment will depend on the cause of the bleeding. HOME CARE INSTRUCTIONS Monitor your condition for any changes. The following actions may help to alleviate any discomfort you are experiencing:  Avoid the use of tampons and douches as directed by your health care provider.  Change your pads frequently. You should get regular pelvic exams and Pap tests. Keep all follow-up appointments for diagnostic tests as directed by your health care provider.  SEEK MEDICAL CARE IF:   Your bleeding lasts more than 1 week.   You feel dizzy at times.  SEEK IMMEDIATE MEDICAL CARE IF:   You pass out.   You are changing pads every 15 to 30 minutes.   You have abdominal pain.  You have a fever.   You become sweaty or weak.   You are passing large blood clots from the vagina.   You start to feel nauseous and vomit. MAKE SURE YOU:   Understand these instructions.  Will watch your condition.  Will get help right away if you are not doing well or get worse.   This  information is not intended to replace advice given to you by your health care provider. Make sure you discuss any questions you have with your health care provider.   Document Released: 10/07/2005 Document Revised: 10/12/2013 Document Reviewed: 05/06/2013 Elsevier Interactive Patient Education [employer].

## 2016-04-22 NOTE — ED Provider Notes (Signed)
CSN: [id number]     Arrival date & time 04/22/16  [id number] History   First MD Initiated Contact with Patient 04/22/16 1038     No chief complaint on file.    (Consider location/radiation/quality/duration/timing/severity/associated sxs/prior Treatment) The history is provided by the patient.    Pt with hx HTN, fibroids p/w abnormal vaginal bleeding x 6 days.  States she went through menopause two years ago.  The bleeding began 6 days ago as spotting and has gradually increased to heavier bleeding, this morning she had a gush of blood.  Is using one thick pad that she changes every 10 hours.  Is having lightheadedness and SOB with walking.  Has no PCP.  Has not seen a gynecologist or had a pap smear since 1989.  Was seen last year for similar symptoms and was found to have multiple fibroids.   Denies fevers, chest pain, weight loss (has gained weight, reports eating poorly), night sweats.  Denies urinary or bowel changes.     Past Medical History  Diagnosis Date  . Hypertension    History reviewed. No pertinent past surgical history. No family history on file. Social History  Substance Use Topics  . Smoking status: Never Smoker   . Smokeless tobacco: Never Used  . Alcohol Use: No   OB History    No data available     Review of Systems  All other systems reviewed and are negative.     Allergies  Penicillins  Home Medications   Prior to Admission medications   Medication Sig Start Date End Date Taking? Authorizing Provider  aspirin 325 MG tablet Take 325 mg by mouth daily.    Historical Provider, MD  hydrochlorothiazide (HYDRODIURIL) 25 MG tablet Take 1 tablet (25 mg total) by mouth daily. 09/28/15   Micheline Chapman, NP  meloxicam (MOBIC) 15 MG tablet Take 1 tablet (15 mg total) by mouth daily. 09/28/15   Micheline Chapman, NP  naproxen (NAPROSYN) 500 MG tablet Take 1 tablet (500 mg total) by mouth 2 (two) times daily with a meal. 09/28/15   Micheline Chapman, NP  pravastatin  (PRAVACHOL) 40 MG tablet Take 1 tablet (40 mg total) by mouth daily. 10/02/15   Micheline Chapman, NP   BP 163/108 mmHg  Pulse 106  Temp(Src) 99.1 F (37.3 C) (Oral)  Resp 18  Ht 5\' 7"  (1.702 m)  Wt 88.451 kg  BMI 30.53 kg/m2  SpO2 98% Physical Exam  Constitutional: She appears well-developed and well-nourished. No distress.  HENT:  Head: Normocephalic and atraumatic.  Neck: Neck supple.  Cardiovascular: Normal rate and regular rhythm.   Pulmonary/Chest: Effort normal and breath sounds normal. No respiratory distress. She has no wheezes. She has no rales.  Abdominal: Soft. She exhibits no distension. There is no rebound and no guarding.  Very mild discomfort with palpation of lower abdomen  Genitourinary: Uterus is not tender. Cervix exhibits no motion tenderness. Right adnexum displays no mass and no tenderness. Left adnexum displays no mass and no tenderness. There is bleeding in the vagina. No erythema in the vagina. No foreign body around the vagina.  Moderate amount of dark blood in vagina   Neurological: She is alert.  Skin: She is not diaphoretic.  Nursing note and vitals reviewed.   ED Course  Procedures (including critical care time) Labs Review Labs Reviewed  BASIC METABOLIC PANEL - Abnormal; Notable for the following:    Glucose, Bld 114 (*)    All other components within  normal limits  CBC WITH DIFFERENTIAL/PLATELET - Abnormal; Notable for the following:    Hemoglobin 15.1 (*)    All other components within normal limits  URINALYSIS, ROUTINE W REFLEX MICROSCOPIC (NOT AT [facility]) - Abnormal; Notable for the following:    Color, Urine RED (*)    APPearance CLOUDY (*)    Hgb urine dipstick LARGE (*)    Leukocytes, UA TRACE (*)    All other components within normal limits  URINE MICROSCOPIC-ADD ON - Abnormal; Notable for the following:    Squamous Epithelial / LPF 0-5 (*)    Bacteria, UA MANY (*)    All other components within normal limits  WET PREP, GENITAL  RPR   HIV ANTIBODY (ROUTINE TESTING)  I-STAT BETA HCG BLOOD, ED ([facility], [facility], [facility] ONLY)  TYPE AND SCREEN  ABO/RH  GC/CHLAMYDIA PROBE AMP ([place]) NOT AT [facility]    Imaging Review US Transvaginal Non-ob  04/22/2016  CLINICAL DATA:  Postmenopausal bleeding 6 days. EXAM: TRANSABDOMINAL AND TRANSVAGINAL ULTRASOUND OF PELVIS TECHNIQUE: Both transabdominal and transvaginal ultrasound examinations of the pelvis were performed. Transabdominal technique was performed for global imaging of the pelvis including uterus, ovaries, adnexal regions, and pelvic cul-de-sac. It was necessary to proceed with endovaginal exam following the transabdominal exam to visualize the endometrium and ovaries. COMPARISON:  05/20/2015 FINDINGS: Uterus Measurements: 6.2 x 7.2 x 9.1 cm. There are 3 uterine leiomyomas with the largest over the right side of the uterine fundus measuring 2.3 cm. Multiple nabothian cyst. Endometrium Thickness: 12.8 mm. Oval hypoechoic mass within the thickened endometrium measuring 1.1 x 1.5 x 2.2 cm without significant change. Right ovary Measurements: 2.2 x 1.8 x 2.4 cm. Normal appearance/no adnexal mass. Normal color flow. Left ovary Measurements: 1.4 x 1.9 x 2.9 cm. Normal appearance/no adnexal mass. Normal color flow. Other findings No abnormal free fluid. IMPRESSION: Thickened endometrium in this postmenopausal bleeding patient with oval hypoechoic mass centrally within the endometrium measuring 1.1 x 1.5 x 2.2 cm without significant change. This may represent a polyp, submucosal fibroid or neoplasm. Recommend GYN consultation and possible sonohysterography versus tissue sampling. Normal size uterus with several small leiomyomas with the largest over the right fundus measuring 2.3 cm. Normal ovaries. Electronically Signed   By: Marin Olp M.D.   On: 04/22/2016 13:04   US Pelvis Complete  04/22/2016  CLINICAL DATA:  Postmenopausal bleeding 6 days. EXAM: TRANSABDOMINAL AND TRANSVAGINAL ULTRASOUND OF PELVIS  TECHNIQUE: Both transabdominal and transvaginal ultrasound examinations of the pelvis were performed. Transabdominal technique was performed for global imaging of the pelvis including uterus, ovaries, adnexal regions, and pelvic cul-de-sac. It was necessary to proceed with endovaginal exam following the transabdominal exam to visualize the endometrium and ovaries. COMPARISON:  05/20/2015 FINDINGS: Uterus Measurements: 6.2 x 7.2 x 9.1 cm. There are 3 uterine leiomyomas with the largest over the right side of the uterine fundus measuring 2.3 cm. Multiple nabothian cyst. Endometrium Thickness: 12.8 mm. Oval hypoechoic mass within the thickened endometrium measuring 1.1 x 1.5 x 2.2 cm without significant change. Right ovary Measurements: 2.2 x 1.8 x 2.4 cm. Normal appearance/no adnexal mass. Normal color flow. Left ovary Measurements: 1.4 x 1.9 x 2.9 cm. Normal appearance/no adnexal mass. Normal color flow. Other findings No abnormal free fluid. IMPRESSION: Thickened endometrium in this postmenopausal bleeding patient with oval hypoechoic mass centrally within the endometrium measuring 1.1 x 1.5 x 2.2 cm without significant change. This may represent a polyp, submucosal fibroid or neoplasm. Recommend GYN consultation and possible sonohysterography versus  tissue sampling. Normal size uterus with several small leiomyomas with the largest over the right fundus measuring 2.3 cm. Normal ovaries. Electronically Signed   By: Marin Olp M.D.   On: 04/22/2016 13:04   I have personally reviewed and evaluated these images and lab results as part of my medical decision-making.   EKG Interpretation None       10:40 AM Pt not yet in room.    2:27 PM ambulates well without assistance.   MDM   Final diagnoses:  Abnormal vaginal bleeding  Uterine mass    Afebrile, nontoxic patient with abnormal postmenopausal vaginal bleeding.  Hgb is 15. VS normal and stable after arrival.  Moderate blood on exam. US demonstrates  mass that needs follow up, it was present on prior exam but pt did not follow up.  Needs GYN.   Discussed this and encouraged close follow up.  Pt aware of [facility] and clinic, health department as possibilities for follow up.  She is in process of getting orange card, will also follow up with [facility].  D/C home with close follow up, precautions for immediate follow up with [facility].  Discussed result, findings, treatment, and follow up  with patient.  Pt given return precautions.  Pt verbalizes understanding and agrees with plan.         Clayton Bibles, PA-C 04/22/16 [place], MD 04/23/16 [phone]

## 2016-04-22 NOTE — ED Notes (Signed)
Pt transported to US

## 2016-04-22 NOTE — ED Notes (Signed)
Pt ambulated to RR .  

## 2016-04-22 NOTE — ED Notes (Signed)
Family at bedside. 

## 2016-04-22 NOTE — ED Notes (Addendum)
patient here with heavy vaginal bleeding x 6 days, denies clots. Mild abdominal cramping. Complains of fatigue and headache

## 2016-04-23 LAB — HIV ANTIBODY (ROUTINE TESTING W REFLEX): HIV Screen 4th Generation wRfx: NONREACTIVE

## 2016-04-23 LAB — RPR: RPR: NONREACTIVE

## 2016-04-24 LAB — GC/CHLAMYDIA PROBE AMP (~~LOC~~) NOT AT ARMC
Chlamydia: NEGATIVE
Neisseria Gonorrhea: NEGATIVE

## 2016-04-26 ENCOUNTER — Telehealth (HOSPITAL_COMMUNITY): Payer: Self-pay | Admitting: *Deleted

## 2016-04-26 NOTE — Telephone Encounter (Signed)
Telephoned patient at home # and left message to return call to [facility] 

## 2016-04-29 ENCOUNTER — Telehealth (HOSPITAL_COMMUNITY): Payer: Self-pay | Admitting: *Deleted

## 2016-04-29 NOTE — Telephone Encounter (Signed)
Telephoned patient at home # and left message to return call to [facility] 

## 2016-05-06 ENCOUNTER — Telehealth (HOSPITAL_COMMUNITY): Payer: Self-pay | Admitting: *Deleted

## 2016-05-06 NOTE — Telephone Encounter (Signed)
Telephoned patient at home # and left message to return call to [facility] 

## 2016-05-24 ENCOUNTER — Other Ambulatory Visit (HOSPITAL_COMMUNITY)
Admission: RE | Admit: 2016-05-24 | Discharge: 2016-05-24 | Disposition: A | Discharge status: Home / Self Care | Payer: Medicaid Other | Source: Ambulatory Visit | Attending: Obstetrics & Gynecology | Admitting: Obstetrics & Gynecology

## 2016-05-24 ENCOUNTER — Encounter: Payer: Self-pay | Admitting: Obstetrics & Gynecology

## 2016-05-24 ENCOUNTER — Ambulatory Visit (INDEPENDENT_AMBULATORY_CARE_PROVIDER_SITE_OTHER): Payer: Self-pay | Admitting: Obstetrics & Gynecology

## 2016-05-24 DIAGNOSIS — N950 Postmenopausal bleeding: Secondary | ICD-10-CM

## 2016-05-24 DIAGNOSIS — E669 Obesity, unspecified: Secondary | ICD-10-CM

## 2016-05-24 NOTE — Progress Notes (Signed)
   Subjective:    Patient ID: SHAUN RUBEL, female    DOB: 15-Oct-1961, 55 y.o.   MRN: [id number]  HPI 55 yo SW P1 (22 yo son) here with new onset PMB. She was seen in the ER recently and an u/s showed a small uterus with some small fibroids and a 12 mm endometrium.   Review of Systems     Objective:   Physical Exam [id number] Breathing, conversing, and ambulating normally  UPT negative, consent signed, time out done Cervix prepped with betadine and grasped with a single tooth tenaculum Uterus sounded to 8 cm Pipelle used for 2 passes with a moderate amount of tissue obtained. A gritty sensation was appreciated. She tolerated the procedure well.         Assessment & Plan:  PMB- await pathology Preventative care- She will fill out a form for mammogram scholarship She was given the numbers for the health dept and the free pap clinic I will call her with her pathology results

## 2016-06-10 ENCOUNTER — Encounter: Payer: Self-pay | Admitting: Obstetrics & Gynecology

## 2016-06-11 ENCOUNTER — Telehealth: Payer: Self-pay | Admitting: General Practice

## 2016-06-11 ENCOUNTER — Encounter: Payer: Self-pay | Admitting: Obstetrics & Gynecology

## 2016-06-11 NOTE — Telephone Encounter (Signed)
Opened in error

## 2016-06-12 ENCOUNTER — Encounter: Payer: Self-pay | Admitting: Obstetrics & Gynecology

## 2016-06-13 ENCOUNTER — Other Ambulatory Visit: Payer: Self-pay | Admitting: General Practice

## 2016-06-13 ENCOUNTER — Encounter: Payer: Self-pay | Admitting: Obstetrics & Gynecology

## 2016-06-13 DIAGNOSIS — N950 Postmenopausal bleeding: Secondary | ICD-10-CM

## 2016-06-13 MED ORDER — MEGESTROL ACETATE 40 MG PO TABS: 40.0000 mg | ORAL_TABLET | Freq: Two times a day (BID) | ORAL | 0 refills | Status: DC

## 2016-06-14 ENCOUNTER — Encounter: Payer: Self-pay | Admitting: Obstetrics & Gynecology

## 2016-06-20 ENCOUNTER — Emergency Department (HOSPITAL_COMMUNITY)
Admission: EM | Admit: 2016-06-20 | Discharge: 2016-06-20 | Disposition: A | Discharge status: Home / Self Care | Payer: Medicaid Other | Attending: Emergency Medicine | Admitting: Emergency Medicine

## 2016-06-20 ENCOUNTER — Emergency Department (HOSPITAL_COMMUNITY): Payer: Medicaid Other

## 2016-06-20 ENCOUNTER — Encounter (HOSPITAL_COMMUNITY): Payer: Self-pay | Admitting: *Deleted

## 2016-06-20 DIAGNOSIS — Z79899 Other long term (current) drug therapy: Secondary | ICD-10-CM | POA: Diagnosis not present

## 2016-06-20 DIAGNOSIS — I10 Essential (primary) hypertension: Secondary | ICD-10-CM | POA: Diagnosis not present

## 2016-06-20 DIAGNOSIS — R51 Headache: Secondary | ICD-10-CM | POA: Diagnosis not present

## 2016-06-20 DIAGNOSIS — R519 Headache, unspecified: Secondary | ICD-10-CM

## 2016-06-20 MED ORDER — METOCLOPRAMIDE HCL 5 MG/ML IJ SOLN
10.0000 mg | Freq: Once | INTRAMUSCULAR | Status: AC
  Administered 2016-06-20: 10 mg via INTRAVENOUS
  Filled 2016-06-20: qty 2

## 2016-06-20 MED ORDER — IBUPROFEN 600 MG PO TABS: 600.0000 mg | ORAL_TABLET | Freq: Three times a day (TID) | ORAL | 0 refills | Status: DC | PRN

## 2016-06-20 MED ORDER — HYDROMORPHONE HCL 1 MG/ML IJ SOLN
1.0000 mg | Freq: Once | INTRAMUSCULAR | Status: AC
  Administered 2016-06-20: 1 mg via INTRAVENOUS
  Filled 2016-06-20: qty 1

## 2016-06-20 MED ORDER — MORPHINE SULFATE (PF) 4 MG/ML IV SOLN
4.0000 mg | Freq: Once | INTRAVENOUS | Status: AC
Start: 2016-06-20 — End: 2016-06-20
  Administered 2016-06-20: 4 mg via INTRAVENOUS
  Filled 2016-06-20: qty 1

## 2016-06-20 MED ORDER — KETOROLAC TROMETHAMINE 30 MG/ML IJ SOLN
30.0000 mg | Freq: Once | INTRAMUSCULAR | Status: AC
  Administered 2016-06-20: 30 mg via INTRAVENOUS
  Filled 2016-06-20: qty 1

## 2016-06-20 NOTE — ED Notes (Addendum)
Patient comes in with c/o severe h/a. Patient was told she had high BP last month. Patient states she has been taking her BP meds as prescribed. Patient took prescribed 25mg  indomethacin from PCP for h/a and then took 200mg  ibuprofen x3 today without relief. BP currently is 186/102. States the h/a has made her nauseous.

## 2016-06-20 NOTE — ED Triage Notes (Signed)
Pt arrives via [facility] c/o frontal headache that began this morning around 3 am. Pt has a hx of migraines and has recently been diagnosed with hypertension. Per EMS pt BP is elevated.

## 2016-06-20 NOTE — ED Provider Notes (Signed)
[facility] DEPT Provider Note   CSN: [id number] Arrival date & time: 06/20/16  1319     History   Chief Complaint Chief Complaint  Patient presents with  . Headache    HPI KATTALEYA LINER is a 55 y.o. female.  HPI Patient presents emergency department with headache over the past 24 hours.  She has no significant history of headaches.  She denies photophobia and phonophobia.  No history of migraines.  No history of cancer.  Denies weakness of her arms or legs.  No fevers or chills.  Denies neck pain or stiffness.  She tried medication prior to arrival without improvement.  She never had a headache like this.  It was not acute in onset    Past Medical History:  Diagnosis Date  . Hypertension     Patient Active Problem List   Diagnosis Date Noted  . Obesity 05/24/2016  . PMB (postmenopausal bleeding) 05/24/2016  . Back pain of lumbar region with sciatica 09/28/2015    History reviewed. No pertinent surgical history.  OB History    No data available       Home Medications    Prior to Admission medications   Medication Sig Start Date End Date Taking? Authorizing Provider  Cholecalciferol (VITAMIN D) 2000 units tablet Take 2,000 Units by mouth daily.   Yes Historical Provider, MD  escitalopram (LEXAPRO) 10 MG tablet Take 10 mg by mouth daily as needed (depression).   Yes Historical Provider, MD  ibuprofen (ADVIL,MOTRIN) 200 MG tablet Take 600 mg by mouth every 6 (six) hours as needed for fever or mild pain.    Yes Historical Provider, MD  indomethacin (INDOCIN) 25 MG capsule Take 25 mg by mouth 3 (three) times daily as needed (headache).   Yes Historical Provider, MD  megestrol (MEGACE) 40 MG tablet Take 1 tablet (40 mg total) by mouth 2 (two) times daily. 06/13/16  Yes Truett Mainland, DO  Omega-3 Fatty Acids (FISH OIL) 1000 MG CAPS Take 1,000 mg by mouth 2 (two) times daily.   Yes Historical Provider, MD  propranolol (INDERAL) 40 MG tablet Take 40 mg by mouth 2  (two) times daily.   Yes Historical Provider, MD  meloxicam (MOBIC) 15 MG tablet Take 1 tablet (15 mg total) by mouth daily. Patient not taking: Reported on 06/20/2016 09/28/15   Micheline Chapman, NP  naproxen (NAPROSYN) 500 MG tablet Take 1 tablet (500 mg total) by mouth 2 (two) times daily with a meal. Patient not taking: Reported on 06/20/2016 09/28/15   Micheline Chapman, NP  pravastatin (PRAVACHOL) 40 MG tablet Take 1 tablet (40 mg total) by mouth daily. Patient not taking: Reported on 06/20/2016 10/02/15   Micheline Chapman, NP    Family History No family history on file.  Social History Social History  Substance Use Topics  . Smoking status: Never Smoker  . Smokeless tobacco: Never Used  . Alcohol use No     Allergies   Iron and Penicillins   Review of Systems Review of Systems  All other systems reviewed and are negative.    Physical Exam Updated Vital Signs BP (!) 186/102   Pulse 73   Temp 98.2 F (36.8 C) (Oral)   Resp 16   Ht 5\' 7"  (1.702 m)   Wt 190 lb (86.2 kg)   SpO2 99%   BMI 29.76 kg/m   Physical Exam  Constitutional: She is oriented to person, place, and time. She appears well-developed and well-nourished. No  distress.  HENT:  Head: Normocephalic and atraumatic.  Eyes: EOM are normal.  Neck: Normal range of motion.  Cardiovascular: Normal rate, regular rhythm and normal heart sounds.   Pulmonary/Chest: Effort normal and breath sounds normal.  Abdominal: Soft. She exhibits no distension. There is no tenderness.  Musculoskeletal: Normal range of motion.  Neurological: She is alert and oriented to person, place, and time.  Skin: Skin is warm and dry.  Psychiatric: She has a normal mood and affect. Judgment normal.  Nursing note and vitals reviewed.    ED Treatments / Results  Labs (all labs ordered are listed, but only abnormal results are displayed) Labs Reviewed - No data to display  EKG  EKG Interpretation None       Radiology Ct  Head Wo Contrast  Result Date: 06/20/2016 CLINICAL DATA:  55 year old female with history of headaches since 9 a.m. Hypertension. EXAM: CT HEAD WITHOUT CONTRAST TECHNIQUE: Contiguous axial images were obtained from the base of the skull through the vertex without intravenous contrast. COMPARISON:  No priors. FINDINGS: Brain: No evidence of acute infarction, hemorrhage, hydrocephalus, extra-axial collection or mass lesion/mass effect. Cerebral atrophy, most severe in the frontal regions bilaterally, advanced for age. Patchy and confluent areas of decreased attenuation are noted throughout the deep and periventricular white matter of the cerebral hemispheres bilaterally, compatible with chronic microvascular ischemic disease. Vascular: No hyperdense vessel or unexpected calcification. Skull: No acute displaced skull fractures. Sinuses/Orbits: Visualized paranasal sinuses and mastoids are well pneumatized. Bilateral globes and retro-orbital soft tissues are grossly normal in appearance. IMPRESSION: 1. No acute intracranial abnormalities. 2. 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 3. 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 Electronically Signed   By: Vinnie Langton M.D.   On: 06/20/2016 17:16    Procedures Procedures (including critical care time)  Medications Ordered in ED Medications - No data to display   Initial Impression / Assessment and Plan / ED Course  I have reviewed the triage vital signs and the nursing notes.  Pertinent labs & imaging results that were available during my care of the patient were reviewed by me and considered in my medical decision making (see chart for details).  Clinical Course   Patient is overall well-appearing.  Head CT without acute pathology.  Pain will be treated here.  She will need to follow-up with neurology as an outpatient.  She understands to return to  the ER for new or worsening symptoms  Final Clinical Impressions(s) / ED Diagnoses   Final diagnoses:  None    New Prescriptions New Prescriptions   No medications on file     Jola Schmidt, MD 06/20/16 1728

## 2016-06-26 ENCOUNTER — Encounter: Payer: Self-pay | Admitting: Obstetrics & Gynecology

## 2016-06-27 ENCOUNTER — Encounter: Payer: Self-pay | Admitting: Obstetrics & Gynecology

## 2016-08-02 ENCOUNTER — Encounter (HOSPITAL_COMMUNITY): Payer: Self-pay | Admitting: Emergency Medicine

## 2016-08-02 ENCOUNTER — Emergency Department (HOSPITAL_COMMUNITY): Payer: Medicaid Other

## 2016-08-02 ENCOUNTER — Emergency Department (HOSPITAL_COMMUNITY)
Admission: EM | Admit: 2016-08-02 | Discharge: 2016-08-02 | Disposition: A | Discharge status: Home / Self Care | Payer: Medicaid Other | Attending: Emergency Medicine | Admitting: Emergency Medicine

## 2016-08-02 DIAGNOSIS — S39012A Strain of muscle, fascia and tendon of lower back, initial encounter: Secondary | ICD-10-CM | POA: Diagnosis not present

## 2016-08-02 DIAGNOSIS — Z791 Long term (current) use of non-steroidal anti-inflammatories (NSAID): Secondary | ICD-10-CM | POA: Insufficient documentation

## 2016-08-02 DIAGNOSIS — S3992XA Unspecified injury of lower back, initial encounter: Secondary | ICD-10-CM | POA: Diagnosis present

## 2016-08-02 DIAGNOSIS — T148XXA Other injury of unspecified body region, initial encounter: Secondary | ICD-10-CM

## 2016-08-02 DIAGNOSIS — I10 Essential (primary) hypertension: Secondary | ICD-10-CM | POA: Insufficient documentation

## 2016-08-02 DIAGNOSIS — Y929 Unspecified place or not applicable: Secondary | ICD-10-CM | POA: Diagnosis not present

## 2016-08-02 DIAGNOSIS — Y999 Unspecified external cause status: Secondary | ICD-10-CM | POA: Insufficient documentation

## 2016-08-02 DIAGNOSIS — Z79899 Other long term (current) drug therapy: Secondary | ICD-10-CM | POA: Diagnosis not present

## 2016-08-02 DIAGNOSIS — M62830 Muscle spasm of back: Secondary | ICD-10-CM

## 2016-08-02 DIAGNOSIS — Y939 Activity, unspecified: Secondary | ICD-10-CM | POA: Insufficient documentation

## 2016-08-02 DIAGNOSIS — W1839XA Other fall on same level, initial encounter: Secondary | ICD-10-CM | POA: Insufficient documentation

## 2016-08-02 HISTORY — DX: Migraine, unspecified, not intractable, without status migrainosus: G43.909

## 2016-08-02 MED ORDER — OXYCODONE-ACETAMINOPHEN 5-325 MG PO TABS
2.0000 | ORAL_TABLET | Freq: Once | ORAL | Status: AC
  Administered 2016-08-02: 2 via ORAL
  Filled 2016-08-02: qty 2

## 2016-08-02 MED ORDER — CYCLOBENZAPRINE HCL 10 MG PO TABS: 10.0000 mg | ORAL_TABLET | Freq: Every day | ORAL | 0 refills | Status: AC

## 2016-08-02 NOTE — ED Provider Notes (Signed)
[facility] DEPT Provider Note   CSN: [id number] Arrival date & time: 08/02/16  [id number]     History   Chief Complaint Chief Complaint  Patient presents with  . Back Pain    HPI [name] is a 55 y.o. female.  The history is provided by the patient.  Back Pain   This is a new problem. Episode onset: 1.5 weeks. The problem occurs constantly. The problem has been gradually worsening. Associated with: syncope during BM; fall from sitting down to the floor 1-2 days prior to pain onset. The pain is present in the sacro-iliac joint and gluteal region. The quality of the pain is described as shooting (throbbing). The pain radiates to the right thigh, right foot and right knee. The pain is moderate. The symptoms are aggravated by bending, twisting and certain positions. Pertinent negatives include no chest pain, no fever, no numbness, no weight loss, no headaches, no abdominal pain, no bowel incontinence, no perianal numbness, no bladder incontinence, no dysuria and no paresthesias.    Past Medical History:  Diagnosis Date  . Hypertension   . Migraines     Patient Active Problem List   Diagnosis Date Noted  . Obesity 05/24/2016  . PMB (postmenopausal bleeding) 05/24/2016  . Back pain of lumbar region with sciatica 09/28/2015    History reviewed. No pertinent surgical history.  OB History    No data available       Home Medications    Prior to Admission medications   Medication Sig Start Date End Date Taking? Authorizing Provider  Cholecalciferol (VITAMIN D) 2000 units tablet Take 2,000 Units by mouth daily.    Historical Provider, MD  cyclobenzaprine (FLEXERIL) 10 MG tablet Take 1 tablet (10 mg total) by mouth at bedtime. 08/02/16 08/09/16  Fatima Blank, MD  escitalopram (LEXAPRO) 10 MG tablet Take 10 mg by mouth daily as needed (depression).    Historical Provider, MD  ibuprofen (ADVIL,MOTRIN) 600 MG tablet Take 1 tablet (600 mg total) by mouth every 8  (eight) hours as needed. 06/20/16   Jola Schmidt, MD  indomethacin (INDOCIN) 25 MG capsule Take 25 mg by mouth 3 (three) times daily as needed (headache).    Historical Provider, MD  megestrol (MEGACE) 40 MG tablet Take 1 tablet (40 mg total) by mouth 2 (two) times daily. 06/13/16   Truett Mainland, DO  Omega-3 Fatty Acids (FISH OIL) 1000 MG CAPS Take 1,000 mg by mouth 2 (two) times daily.    Historical Provider, MD  pravastatin (PRAVACHOL) 40 MG tablet Take 1 tablet (40 mg total) by mouth daily. Patient not taking: Reported on 06/20/2016 10/02/15   Micheline Chapman, NP  propranolol (INDERAL) 40 MG tablet Take 40 mg by mouth 2 (two) times daily.    Historical Provider, MD    Family History No family history on file.  Social History Social History  Substance Use Topics  . Smoking status: Never Smoker  . Smokeless tobacco: Never Used  . Alcohol use No     Allergies   Iron and Penicillins   Review of Systems Review of Systems  Constitutional: Negative for chills, fever and weight loss.  HENT: Negative for ear pain and sore throat.   Eyes: Negative for pain and visual disturbance.  Respiratory: Negative for cough and shortness of breath.   Cardiovascular: Negative for chest pain and palpitations.  Gastrointestinal: Negative for abdominal pain, bowel incontinence and vomiting.  Genitourinary: Negative for bladder incontinence, dysuria and hematuria.  Musculoskeletal:  Positive for back pain. Negative for arthralgias.  Skin: Negative for color change and rash.  Neurological: Negative for seizures, syncope, numbness, headaches and paresthesias.  All other systems reviewed and are negative.    Physical Exam Updated Vital Signs BP 134/84   Pulse 77   Temp 98.2 F (36.8 C) (Oral)   Resp 16   LMP 06/06/2015   SpO2 97%   Physical Exam  Constitutional: She is oriented to person, place, and time. She appears well-developed and well-nourished. No distress.  HENT:  Head:  Normocephalic and atraumatic.  Right Ear: External ear normal.  Left Ear: External ear normal.  Nose: Nose normal.  Eyes: Conjunctivae and EOM are normal. No scleral icterus.  Neck: Normal range of motion and phonation normal.  Cardiovascular: Normal rate and regular rhythm.   Pulmonary/Chest: Effort normal. No stridor. No respiratory distress.  Abdominal: She exhibits no distension.  Musculoskeletal: Normal range of motion. She exhibits no edema.       Cervical back: She exhibits no bony tenderness.       Thoracic back: She exhibits no bony tenderness.       Lumbar back: She exhibits tenderness. She exhibits no bony tenderness.       Back:  Neurological: She is alert and oriented to person, place, and time.  Spine Exam: Inspection/Palpation: TTP along left lumbar paraspinal and sacroiliac muscles Strength: 5/5 throughout LE bilaterally (hip flexion/extension, adduction/abduction; knee flexion/extension; foot dorsiflexion/plantarflexion, inversion/eversion; great toe inversion) Sensation: Intact to light touch in proximal and distal LE bilaterally Reflexes: 2+ quadriceps and achilles reflexes    Skin: She is not diaphoretic.  Psychiatric: She has a normal mood and affect. Her behavior is normal.  Vitals reviewed.    ED Treatments / Results  Labs (all labs ordered are listed, but only abnormal results are displayed) Labs Reviewed - No data to display  EKG  EKG Interpretation None       Radiology Dg Lumbar Spine 2-3 Views  Result Date: 08/02/2016 CLINICAL DATA:  Increasing mid and low back pain over the past 10 days. No known injury. EXAM: LUMBAR SPINE - 2-3 VIEW COMPARISON:  None. FINDINGS: There is no evidence of lumbar spine fracture. Alignment is normal. Intervertebral disc spaces are maintained. A few aortic atherosclerotic calcifications are identified. IMPRESSION: Normal-appearing lumbar spine. Atherosclerosis. Electronically Signed   By: Inge Rise M.D.   On:  08/02/2016 10:54    Procedures Procedures (including critical care time)  Medications Ordered in ED Medications  oxyCODONE-acetaminophen (PERCOCET/ROXICET) 5-325 MG per tablet 2 tablet (2 tablets Oral Given 08/02/16 0805)     Initial Impression / Assessment and Plan / ED Course  I have reviewed the triage vital signs and the nursing notes.  Pertinent labs & imaging results that were available during my care of the patient were reviewed by me and considered in my medical decision making (see chart for details).  Clinical Course    55 y.o. female presents with back pain in lumbar area for 1.5 weeks with signs of radicular pain. Fall several days prior to onset.  No red flag symptoms of fever, weight loss, saddle anesthesia, weakness, fecal/urinary incontinence or urinary retention. Suspect MSK etiology. No indication for imaging emergently. Patient was recommended to take short course of scheduled NSAIDs and engage in early mobility as definitive treatment. Return precautions discussed for worsening or new concerning symptoms.   The patient is safe for discharge with strict return precautions.  Final Clinical Impressions(s) / ED Diagnoses  Final diagnoses:  Muscle strain  Muscle spasm of back  Strain of lumbar region, initial encounter   Disposition: Discharge  Condition: Good  I have discussed the results, Dx and Tx plan with the patient who expressed understanding and agree(s) with the plan. Discharge instructions discussed at great length. The patient was given strict return precautions who verbalized understanding of the instructions. No further questions at time of discharge.    New Prescriptions   CYCLOBENZAPRINE (FLEXERIL) 10 MG TABLET    Take 1 tablet (10 mg total) by mouth at bedtime.    Follow Up: [place] [facility] [place] [phone] Call  For help establishing care with a care  provider      Fatima Blank, MD 08/02/16 [phone]

## 2016-08-02 NOTE — ED Triage Notes (Signed)
Per EMS-right back pain radiating down right leg-has not been diagnosed with sciatica

## 2016-10-15 ENCOUNTER — Emergency Department (HOSPITAL_COMMUNITY): Payer: Medicaid Other

## 2016-10-15 ENCOUNTER — Emergency Department (HOSPITAL_COMMUNITY)
Admission: EM | Admit: 2016-10-15 | Discharge: 2016-10-15 | Disposition: A | Discharge status: Home / Self Care | Payer: Medicaid Other | Attending: Emergency Medicine | Admitting: Emergency Medicine

## 2016-10-15 ENCOUNTER — Encounter (HOSPITAL_COMMUNITY): Payer: Self-pay | Admitting: *Deleted

## 2016-10-15 DIAGNOSIS — S6991XA Unspecified injury of right wrist, hand and finger(s), initial encounter: Secondary | ICD-10-CM | POA: Diagnosis present

## 2016-10-15 DIAGNOSIS — Z79899 Other long term (current) drug therapy: Secondary | ICD-10-CM | POA: Diagnosis not present

## 2016-10-15 DIAGNOSIS — S60221A Contusion of right hand, initial encounter: Secondary | ICD-10-CM | POA: Insufficient documentation

## 2016-10-15 DIAGNOSIS — I10 Essential (primary) hypertension: Secondary | ICD-10-CM | POA: Diagnosis not present

## 2016-10-15 DIAGNOSIS — Y939 Activity, unspecified: Secondary | ICD-10-CM | POA: Diagnosis not present

## 2016-10-15 DIAGNOSIS — W230XXA Caught, crushed, jammed, or pinched between moving objects, initial encounter: Secondary | ICD-10-CM | POA: Insufficient documentation

## 2016-10-15 DIAGNOSIS — Y929 Unspecified place or not applicable: Secondary | ICD-10-CM | POA: Insufficient documentation

## 2016-10-15 DIAGNOSIS — Y999 Unspecified external cause status: Secondary | ICD-10-CM | POA: Diagnosis not present

## 2016-10-15 NOTE — Discharge Instructions (Signed)
X-ray shows no fractures. Continue to ice your hand, elevate, ibuprofen or tylenol for pain. ACE wrap as needed. Follow up with family doctor for recheck as needed.

## 2016-10-15 NOTE — ED Provider Notes (Signed)
[facility] DEPT Provider Note   CSN: [id number] Arrival date & time: 10/15/16  1409 By signing my name below, I, Dyke Brackett, attest that this documentation has been prepared under the direction and in the presence of non-physician practitioner, Jeannett Senior, PA-C. Electronically Signed: Dyke Brackett, Scribe. 10/15/2016. 2:35 PM.   History   Chief Complaint Chief Complaint  Patient presents with  . Hand Injury    HPI [name] is a 55 y.o. female with a hx of HTN who presents to the Emergency Department complaining of sudden onset, constant right hand pain s/p mechanical injury last night. Pt's right hand was shut in the car door, causing pain and associated swelling to her middle knuckle. She rates her pain 8/10 in severity. No medications taken PTA. She has iced the hand with some relief. She denies any other injury, weakness or numbness.   The history is provided by the patient. No language interpreter was used.   Past Medical History:  Diagnosis Date  . Hypertension   . Migraines     Patient Active Problem List   Diagnosis Date Noted  . Obesity 05/24/2016  . PMB (postmenopausal bleeding) 05/24/2016  . Back pain of lumbar region with sciatica 09/28/2015    No past surgical history on file.  OB History    No data available       Home Medications    Prior to Admission medications   Medication Sig Start Date End Date Taking? Authorizing Provider  Cholecalciferol (VITAMIN D) 2000 units tablet Take 2,000 Units by mouth daily.    Historical Provider, MD  escitalopram (LEXAPRO) 10 MG tablet Take 10 mg by mouth daily as needed (depression).    Historical Provider, MD  ibuprofen (ADVIL,MOTRIN) 600 MG tablet Take 1 tablet (600 mg total) by mouth every 8 (eight) hours as needed. 06/20/16   Jola Schmidt, MD  indomethacin (INDOCIN) 25 MG capsule Take 25 mg by mouth 3 (three) times daily as needed (headache).    Historical Provider, MD  megestrol (MEGACE) 40  MG tablet Take 1 tablet (40 mg total) by mouth 2 (two) times daily. 06/13/16   Truett Mainland, DO  Omega-3 Fatty Acids (FISH OIL) 1000 MG CAPS Take 1,000 mg by mouth 2 (two) times daily.    Historical Provider, MD  pravastatin (PRAVACHOL) 40 MG tablet Take 1 tablet (40 mg total) by mouth daily. Patient not taking: Reported on 06/20/2016 10/02/15   Micheline Chapman, NP  propranolol (INDERAL) 40 MG tablet Take 40 mg by mouth 2 (two) times daily.    Historical Provider, MD    Family History No family history on file.  Social History Social History  Substance Use Topics  . Smoking status: Never Smoker  . Smokeless tobacco: Never Used  . Alcohol use No     Allergies   Iron and Penicillins   Review of Systems Review of Systems  Musculoskeletal: Positive for arthralgias and myalgias.  Neurological: Negative for weakness and numbness.   Physical Exam Updated Vital Signs LMP 06/06/2015   Physical Exam  Constitutional: She is oriented to person, place, and time. She appears well-developed and well-nourished. No distress.  HENT:  Head: Normocephalic and atraumatic.  Eyes: Conjunctivae are normal.  Cardiovascular: Normal rate.   Pulmonary/Chest: Effort normal.  Abdominal: She exhibits no distension.  Musculoskeletal:  Mild bruising and swelling noted to the dorsal right hand, specifically over second and third MCP joints. Pain with range of motion of those 2 fingers and MCP  joints. Normal rest of the hand exam with no tenderness to palpation over carpals, metacarpals, wrist joint, fingers. Cap refill less than 2 seconds distally. Distal radial pulses intact.  Neurological: She is alert and oriented to person, place, and time.  Sensation intact to the distal right hand  Skin: Skin is warm and dry.  Psychiatric: She has a normal mood and affect.  Nursing note and vitals reviewed.  ED Treatments / Results  DIAGNOSTIC STUDIES:  Oxygen Saturation is 98% on RA, normal by my  interpretation.    COORDINATION OF CARE:  2:34 PM Discussed treatment plan with pt at bedside and pt agreed to plan.  Labs (all labs ordered are listed, but only abnormal results are displayed) Labs Reviewed - No data to display  EKG  EKG Interpretation None       Radiology Dg Hand Complete Right  Result Date: 10/15/2016 CLINICAL DATA:  Hand closed in door with pain, initial encounter EXAM: RIGHT HAND - COMPLETE 3+ VIEW COMPARISON:  None. FINDINGS: There is no evidence of fracture or dislocation. There is no evidence of arthropathy or other focal bone abnormality. Soft tissues are unremarkable. IMPRESSION: No acute abnormality noted. Electronically Signed   By: Inez Catalina M.D.   On: 10/15/2016 15:28    Procedures Procedures (including critical care time)  Medications Ordered in ED Medications - No data to display   Initial Impression / Assessment and Plan / ED Course  I have reviewed the triage vital signs and the nursing notes.  Pertinent labs & imaging results that were available during my care of the patient were reviewed by me and considered in my medical decision making (see chart for details).  Clinical Course    Patient emergency department with crush injury to the right hand. X-rays negative. Suspect contusion. Ice and elevate at home. Ibuprofen or Tylenol for pain. Ace wrap provided for support. Follow with primary care doctor as needed.  Final Clinical Impressions(s) / ED Diagnoses   Final diagnoses:  Contusion of right hand, initial encounter    New Prescriptions New Prescriptions   No medications on file    Vitals:   10/15/16 1430 10/15/16 1431  BP: 125/81   Resp: 16   Temp: 98.3 F (36.8 C)   TempSrc: Oral   SpO2: 98%   Weight:  86.2 kg  Height:  5\' 7"  (1.702 m)      Jeannett Senior, PA-C 10/15/16 [place], MD 10/15/16 1904

## 2016-10-15 NOTE — ED Notes (Addendum)
Triage completed by this RN. Triage mistakenly charted on coworkers doc. Marcelo Baldy RN completed full triage and assessment.

## 2016-10-15 NOTE — ED Triage Notes (Signed)
Patient comes in with c/o hand injury and pain. Patient's right hand was caught in car door yesterday. Patient's middle knuckle is swollen and painful. Patient has good cap refill, no numbess/tinglinging, full ROM.

## 2016-10-29 ENCOUNTER — Ambulatory Visit (INDEPENDENT_AMBULATORY_CARE_PROVIDER_SITE_OTHER): Payer: Medicaid Other | Admitting: Neurology

## 2016-10-29 ENCOUNTER — Encounter: Payer: Self-pay | Admitting: Neurology

## 2016-10-29 VITALS — BP 135/89 | HR 84 | Ht 67.5 in | Wt 186.4 lb

## 2016-10-29 DIAGNOSIS — Z79899 Other long term (current) drug therapy: Secondary | ICD-10-CM | POA: Diagnosis not present

## 2016-10-29 DIAGNOSIS — R519 Headache, unspecified: Secondary | ICD-10-CM

## 2016-10-29 DIAGNOSIS — R51 Headache: Secondary | ICD-10-CM

## 2016-10-29 DIAGNOSIS — G319 Degenerative disease of nervous system, unspecified: Secondary | ICD-10-CM | POA: Diagnosis not present

## 2016-10-29 DIAGNOSIS — G8929 Other chronic pain: Secondary | ICD-10-CM

## 2016-10-29 MED ORDER — PROPRANOLOL HCL 60 MG PO TABS: 60.0000 mg | ORAL_TABLET | Freq: Two times a day (BID) | ORAL | 6 refills | Status: DC

## 2016-10-29 MED ORDER — DIVALPROEX SODIUM 500 MG PO DR TAB: DELAYED_RELEASE_TABLET | ORAL | 6 refills | Status: DC

## 2016-10-29 NOTE — Patient Instructions (Addendum)
Remember to drink plenty of fluid, eat healthy meals and do not skip any meals. Try to eat protein with a every meal and eat a healthy snack such as fruit or nuts in between meals. Try to keep a regular sleep-wake schedule and try to exercise daily, particularly in the form of walking, 20-30 minutes a day, if you can.   As far as your medications are concerned, I would like to suggest:  Increase Depakote to one pill in the morning and 2 at night Increase propranolol to 60mg  twice daily  As far as diagnostic testing: MRI brain, sleep evaluation  I would like to see you back in 10 weeks, sooner if we need to. Please call us with any interim questions, concerns, problems, updates or refill requests.   Our phone number is [phone]. We also have an after hours call service for urgent matters and there is a physician on-call for urgent questions. For any emergencies you know to call 911 or go to the nearest emergency room  Propranolol tablets What is this medicine? PROPRANOLOL (proe PRAN oh lole) is a beta-blocker. Beta-blockers reduce the workload on the heart and help it to beat more regularly. This medicine is used to treat high blood pressure, to control irregular heart rhythms (arrhythmias) and to relieve chest pain caused by angina. It may also be helpful after a heart attack. This medicine is also used to prevent migraine headaches, relieve uncontrollable shaking (tremors), and help certain problems related to the thyroid gland and adrenal gland. This medicine may be used for other purposes; ask your health care provider or pharmacist if you have questions. COMMON BRAND NAME(S): Inderal What should I tell my health care provider before I take this medicine? They need to know if you have any of these conditions: -circulation problems or blood vessel disease -diabetes -history of heart attack or heart disease, vasospastic angina -kidney disease -liver disease -lung or breathing disease,  like asthma or emphysema -pheochromocytoma -slow heart rate -thyroid disease -an unusual or allergic reaction to propranolol, other beta-blockers, medicines, foods, dyes, or preservatives -pregnant or trying to get pregnant -breast-feeding How should I use this medicine? Take this medicine by mouth with a glass of water. Follow the directions on the prescription label. Take your doses at regular intervals. Do not take your medicine more often than directed. Do not stop taking except on your the advice of your doctor or health care professional. Talk to your pediatrician regarding the use of this medicine in children. Special care may be needed. Overdosage: If you think you have taken too much of this medicine contact a poison control center or emergency room at once. NOTE: This medicine is only for you. Do not share this medicine with others. What if I miss a dose? If you miss a dose, take it as soon as you can. If it is almost time for your next dose, take only that dose. Do not take double or extra doses. What may interact with this medicine? Do not take this medicine with any of the following medications: -feverfew -phenothiazines like chlorpromazine, mesoridazine, prochlorperazine, thioridazine This medicine may also interact with the following medications: -aluminum hydroxide gel -antipyrine -antiviral medicines for HIV or AIDS -barbiturates like phenobarbital -certain medicines for blood pressure, heart disease, irregular heart beat -cimetidine -ciprofloxacin -diazepam -fluconazole -haloperidol -isoniazid -medicines for cholesterol like cholestyramine or colestipol -medicines for mental depression -medicines for migraine headache like almotriptan, eletriptan, frovatriptan, naratriptan, rizatriptan, sumatriptan, zolmitriptan -NSAIDs, medicines for pain and inflammation,  like ibuprofen or naproxen -phenytoin -rifampin -teniposide -theophylline -thyroid  medicines -tolbutamide -warfarin -zileuton This list may not describe all possible interactions. Give your health care provider a list of all the medicines, herbs, non-prescription drugs, or dietary supplements you use. Also tell them if you smoke, drink alcohol, or use illegal drugs. Some items may interact with your medicine. What should I watch for while using this medicine? Visit your doctor or health care professional for regular check ups. Check your blood pressure and pulse rate regularly. Ask your health care professional what your blood pressure and pulse rate should be, and when you should contact them. You may get drowsy or dizzy. Do not drive, use machinery, or do anything that needs mental alertness until you know how this drug affects you. Do not stand or sit up quickly, especially if you are an older patient. This reduces the risk of dizzy or fainting spells. Alcohol can make you more drowsy and dizzy. Avoid alcoholic drinks. This medicine can affect blood sugar levels. If you have diabetes, check with your doctor or health care professional before you change your diet or the dose of your diabetic medicine. Do not treat yourself for coughs, colds, or pain while you are taking this medicine without asking your doctor or health care professional for advice. Some ingredients may increase your blood pressure. What side effects may I notice from receiving this medicine? Side effects that you should report to your doctor or health care professional as soon as possible: -allergic reactions like skin rash, itching or hives, swelling of the face, lips, or tongue -breathing problems -changes in blood sugar -cold hands or feet -difficulty sleeping, nightmares -dry peeling skin -hallucinations -muscle cramps or weakness -slow heart rate -swelling of the legs and ankles -vomiting Side effects that usually do not require medical attention (report to your doctor or health care professional if  they continue or are bothersome): -change in sex drive or performance -diarrhea -dry sore eyes -hair loss -nausea -weak or tired This list may not describe all possible side effects. Call your doctor for medical advice about side effects. You may report side effects to FDA at 1-800-FDA-1088. Where should I keep my medicine? Keep out of the reach of children. Store at room temperature between 15 and 30 degrees C (59 and 86 degrees F). Protect from light. Throw away any unused medicine after the expiration date. NOTE: This sheet is a summary. It may not cover all possible information. If you have questions about this medicine, talk to your doctor, pharmacist, or health care provider.  2017 Elsevier/Gold Standard (2013-06-11 14:51:53)   Valproic Acid, Divalproex Sodium capsules What is this medicine? VALPROIC ACID (val PROE ik AS id) is used to treat certain types of seizures in patients with epilepsy. This medicine may be used for other purposes; ask your health care provider or pharmacist if you have questions. COMMON BRAND NAME(S): Depakene What should I tell my health care provider before I take this medicine? They need to know if you have any of these conditions: -if you often drink alcohol -kidney disease -liver disease -low platelet counts -mitochondrial disease -suicidal thoughts, plans, or attempt; a previous suicide attempt by you or a family member -urea cycle disorder (UCD) -an unusual or allergic reaction to divalproex sodium, sodium valproate, valproic acid, other medicines, foods, dyes, or preservatives -pregnant or trying to get pregnant -breast-feeding How should I use this medicine? Take this medicine by mouth with a glass of water. Follow the directions  on the prescription label. Do not cut, crush or chew this medicine. You can take it with or without food. If it upsets your stomach, take it with food. Take your doses at regular intervals. Do not take your medicine  more often than directed. Do not stop taking except on your doctor's advice. A special MedGuide will be given to you by the pharmacist with each prescription and refill. Be sure to read this information carefully each time. Talk to your pediatrician regarding the use of this medicine in children. While this drug may be prescribed for children as young as 10 years for selected conditions, precautions do apply. Overdosage: If you think you have taken too much of this medicine contact a poison control center or emergency room at once. NOTE: This medicine is only for you. Do not share this medicine with others. What if I miss a dose? If you miss a dose, take it as soon as you can. If it is almost time for your next dose, take only that dose. Do not take double or extra doses. What may interact with this medicine? Do not take this medicine with any of the following medications: -sodium phenylbutyrate This medicine may also interact with the following medications: -aspirin -certain antibiotics like ertapenem, imipenem, meropenem -certain medicines for depression, anxiety, or psychotic disturbances -certain medicines for seizures like carbamazepine, clonazepam, diazepam, ethosuximide, felbamate, lamotrigine, phenobarbital, phenytoin, primidone, rufinamide, topiramate -certain medicines that treat or prevent blood clots like warfarin -cholestyramine -female hormones, like estrogens and birth control pills, patches, or rings -propofol -rifampin -ritonavir -tolbutamide -zidovudine This list may not describe all possible interactions. Give your health care provider a list of all the medicines, herbs, non-prescription drugs, or dietary supplements you use. Also tell them if you smoke, drink alcohol, or use illegal drugs. Some items may interact with your medicine. What should I watch for while using this medicine? Tell your doctor or healthcare professional if your symptoms do not get better or they  start to get worse. Wear a medical ID bracelet or chain, and carry a card that describes your disease and details of your medicine and dosage times. You may get drowsy, dizzy, or have blurred vision. Do not drive, use machinery, or do anything that needs mental alertness until you know how this medicine affects you. To reduce dizzy or fainting spells, do not sit or stand up quickly, especially if you are an older patient. Alcohol can increase drowsiness and dizziness. Avoid alcoholic drinks. This medicine can make you more sensitive to the sun. Keep out of the sun. If you cannot avoid being in the sun, wear protective clothing and use sunscreen. Do not use sun lamps or tanning beds/booths. Patients and their families should watch out for new or worsening depression or thoughts of suicide. Also watch out for sudden changes in feelings such as feeling anxious, agitated, panicky, irritable, hostile, aggressive, impulsive, severely restless, overly excited and hyperactive, or not being able to sleep. If this happens, especially at the beginning of treatment or after a change in dose, call your health care professional. Women should inform their doctor if they wish to become pregnant or think they might be pregnant. There is a potential for serious side effects to an unborn child. Talk to your health care professional or pharmacist for more information. Women who become pregnant while using this medicine may enroll in the [place] Pregnancy Registry by calling [phone]. This registry collects information about the safety of antiepileptic drug  use during pregnancy. What side effects may I notice from receiving this medicine? Side effects that you should report to your doctor or health care professional as soon as possible: -allergic reactions like skin rash, itching or hives, swelling of the face, lips, or tongue -changes in vision -redness, blistering, peeling or loosening of the  skin, including inside the mouth -signs and symptoms of liver injury like dark yellow or brown urine; general ill feeling or flu-like symptoms; light-colored stools; loss of appetite; nausea; right upper belly pain; unusually weak or tired; yellowing of the eyes or skin -suicidal thoughts or other mood changes -unusual bleeding or bruising Side effects that usually do not require medical attention (report to your doctor or health care professional if they continue or are bothersome): -constipation -diarrhea -dizziness -hair loss -headache -loss of appetite -weight gain This list may not describe all possible side effects. Call your doctor for medical advice about side effects. You may report side effects to FDA at 1-800-FDA-1088. Where should I keep my medicine? Keep out of reach of children. Store at room temperature between 15 and 25 degrees C (59 and 77 degrees F). Keep container tightly closed. Throw away any unused medicine after the expiration date. NOTE: This sheet is a summary. It may not cover all possible information. If you have questions about this medicine, talk to your doctor, pharmacist, or health care provider.  2017 Elsevier/Gold Standard (2016-01-11 08:27:44)

## 2016-10-29 NOTE — Progress Notes (Signed)
[facility]    Provider:  Dr Jaynee Eagles Referring Provider: Elbert Ewings, FNP Primary Care Physician:  No PCP Per Patient  CC:  Headache  HPI:  [name] is a 56 y.o. female here as a referral from Dr. Rowe Robert for headaches. No significant PMHx of headaches or migraines prior to the start of these 5 months ago. Patient is on Depakote and propranolol. Past medical history  depression, anxiety, hypertension. Headaches start around the eyes or can be unilateral, they are pounding and throbbing, started at least 5 months ago. She was seen at the emergency room this past August which was one of the first headaches. She was walking to the pharmacy and her headache was severe. No light or sound sensitivity, no smell triggers, no nausea or vomiting, can be so severe it makes her cry. They went to the emergency room. She was given multiple medications. She has headaches every day. She wakes up with headaches and especially when sleeping. She has woken herself up snoring. She has nocturnal or morning headache 4/7 days but also happen during the day. No other focal neurologic deficits, associated symptoms, inciting events, modifiable factors. No vision changes, speech changes, weakness, neck pain or stiffness, fevers, chills. She has gained weight.   Reviewed notes, labs and imaging from outside physicians, which showed:  Patient presented to the emergency department with a headache on 06/20/2016. At the time she denied any significant headaches history. She denied photophobia and phonophobia. No history of migraines. No history of cancer. Denied weakness of her arms or legs. No fevers or chills, neck pain or stiffness, she tried medication prior to arrival. 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 Patient was complaining of severe headache. This was in the setting of elevated blood pressure. She took ibuprofen and indomethacin which did not help.  She was described as a frontal headache that began about 3 AM. She was given dye lauded and Reglan and Toradol and morphine.   CT of the head 06/20/2016 showed (Personally reviewed images and agree with the following):  IMPRESSION: 1. No acute intracranial abnormalities. 2. 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 3. 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   BMP July 2017 was unremarkable, as was CBC  Review of Systems: Patient complains of symptoms per HPI as well as the following symptoms: Weight gain, fatigue, blurred vision, shortness of breath, cough, feeling cold, feeling hot, ears, flushing, ringing in ears, incontinence, runny nose, memory loss, confusion, headache, slurred speech, insomnia, depression, anxiety, too much sleep, not enough sleep, decreased energy. Pertinent negatives per HPI. All others negative.   Social History   Social History  . Marital status: Single    Spouse name: N/A  . Number of children: 1  . Years of education: 64   Occupational History  . Unemployed    Social History Main Topics  . Smoking status: Never Smoker  . Smokeless tobacco: Never Used  . Alcohol use No  . Drug use: No  . Sexual activity: Not Currently    Partners: Male   Other Topics Concern  . Not on file   Social History Narrative   Lives with son, Shanon Brow.   Caffeine use: Drinks coffee sometimes   Soda daily   Left handed    Family History  Problem Relation Age of Onset  . Headache Neg Hx     Past  Medical History:  Diagnosis Date  . Anxiety   . Depression   . Hypertension   . Migraines     Past Surgical History:  Procedure Laterality Date  . NO PAST SURGERIES      Current Outpatient Prescriptions  Medication Sig Dispense Refill  . Cholecalciferol (VITAMIN D) 2000 units tablet Take 2,000 Units by mouth daily.    . divalproex (DEPAKOTE) 500 MG DR tablet  Take one tablet in the morning and 2 at night 90 tablet 6  . Omega-3 Fatty Acids (FISH OIL) 1000 MG CAPS Take 1,000 mg by mouth 2 (two) times daily.    . pravastatin (PRAVACHOL) 40 MG tablet Take 1 tablet (40 mg total) by mouth daily. 90 tablet 3  . propranolol (INDERAL) 60 MG tablet Take 1 tablet (60 mg total) by mouth 2 (two) times daily. 60 tablet 6  . indomethacin (INDOCIN) 25 MG capsule Take 25 mg by mouth 3 (three) times daily as needed (headache).     No current facility-administered medications for this visit.     Allergies as of 10/29/2016 - Review Complete 10/15/2016  Allergen Reaction Noted  . Iron Diarrhea 04/22/2016  . Penicillins Nausea And Vomiting 10/20/2013    Vitals: BP 135/89 (BP Location: Right Arm, Patient Position: Sitting, Cuff Size: Normal)   Pulse 84   Ht 5' 7.5" (1.715 m)   Wt 186 lb 6.4 oz (84.6 kg)   LMP 06/06/2015   BMI 28.76 kg/m  Last Weight:  Wt Readings from Last 1 Encounters:  10/29/16 186 lb 6.4 oz (84.6 kg)   Last Height:   Ht Readings from Last 1 Encounters:  10/29/16 5' 7.5" (1.715 m)   Physical exam: Exam: Gen: NAD, conversant, well nourised, obese, well groomed                     CV: RRR, no MRG. No Carotid Bruits. No peripheral edema, warm, nontender Eyes: Conjunctivae clear without exudates or hemorrhage  Neuro: Detailed Neurologic Exam  Speech:    Speech is normal; fluent and spontaneous with normal comprehension.  Cognition:    The patient is oriented to person, place, and time;     recent and remote memory intact;     language fluent;     normal attention, concentration,     fund of knowledge Cranial Nerves:    The pupils are equal, round, and reactive to light. The fundi are normal and spontaneous venous pulsations are present. Visual fields are full to finger confrontation. Extraocular movements are intact. Trigeminal sensation is intact and the muscles of mastication are normal. The face is symmetric. The palate  elevates in the midline. Hearing intact. Voice is normal. Shoulder shrug is normal. The tongue has normal motion without fasciculations.   Coordination:    Normal finger to nose and heel to shin. Normal rapid alternating movements.   Gait:    Heel-toe and tandem gait are normal.   Motor Observation:    No asymmetry, no atrophy, and no involuntary movements noted. Tone:    Normal muscle tone.    Posture:    Posture is normal. normal erect    Strength:    Strength is V/V in the upper and lower limbs.      Sensation: intact to LT     Reflex Exam:  DTR's:    Deep tendon reflexes in the upper and lower extremities are normal bilaterally.   Toes:    The toes are downgoing bilaterally.  Clonus:    Clonus is absent.       Assessment/Plan:  56 year old with nocturnal and morning headaches, obesity. She also reports snoring and waking herself up snoring.   Remember to drink plenty of fluid, eat healthy meals and do not skip any meals. Try to eat protein with a every meal and eat a healthy snack such as fruit or nuts in between meals. Try to keep a regular sleep-wake schedule and try to exercise daily, particularly in the form of walking, 20-30 minutes a day, if you can.   Sleep evaluation for OSA. ESS 10  MRI of the brain due to new onset headache after the age of 61 to evaluate for tumor or lesion or other intracranial etiology  As far as your medications are concerned, I would like to suggest:  Increase Depakote to one pill in the morning and 2 at night Increase propranolol to 60mg  twice daily Discussed side effects as per patient instructions  As far as diagnostic testing: MRI brain, sleep evaluation  I would like to see you back in 10 weeks, sooner if we need to. Please call us with any interim questions, concerns, problems, updates or refill requests.    Discussed the following: To prevent or relieve headaches, try the following: Cool Compress. Lie down and place a  cool compress on your head.  Avoid headache triggers. If certain foods or odors seem to have triggered your migraines in the past, avoid them. A headache diary might help you identify triggers.  Include physical activity in your daily routine. Try a daily walk or other moderate aerobic exercise.  Manage stress. Find healthy ways to cope with the stressors, such as delegating tasks on your to-do list.  Practice relaxation techniques. Try deep breathing, yoga, massage and visualization.  Eat regularly. Eating regularly scheduled meals and maintaining a healthy diet might help prevent headaches. Also, drink plenty of fluids.  Follow a regular sleep schedule. Sleep deprivation might contribute to headaches Consider biofeedback. With this mind-body technique, you learn to control certain bodily functions - such as muscle tension, heart rate and blood pressure - to prevent headaches or reduce headache pain.    Proceed to emergency room if you experience new or worsening symptoms or symptoms do not resolve, if you have new neurologic symptoms or if headache is severe, or for any concerning symptom.    Sarina Ill, MD  [facility] [address] [place], [place] [address]  Phone [phone] Fax [phone]

## 2016-10-30 ENCOUNTER — Encounter: Payer: Self-pay | Admitting: Neurology

## 2016-10-30 DIAGNOSIS — R519 Headache, unspecified: Secondary | ICD-10-CM | POA: Insufficient documentation

## 2016-10-30 DIAGNOSIS — R51 Headache: Secondary | ICD-10-CM

## 2016-10-31 ENCOUNTER — Encounter: Payer: Self-pay | Admitting: Neurology

## 2016-11-01 ENCOUNTER — Other Ambulatory Visit: Payer: Self-pay | Admitting: Neurology

## 2016-11-01 ENCOUNTER — Encounter: Payer: Self-pay | Admitting: Neurology

## 2016-11-07 ENCOUNTER — Encounter: Payer: Self-pay | Admitting: Neurology

## 2016-11-08 ENCOUNTER — Other Ambulatory Visit: Payer: Medicaid Other

## 2016-11-08 ENCOUNTER — Encounter: Payer: Self-pay | Admitting: Neurology

## 2016-11-08 ENCOUNTER — Telehealth: Payer: Self-pay | Admitting: Neurology

## 2016-11-08 NOTE — Telephone Encounter (Signed)
I called and left a voicemail for the patient. I informed her that she can call [facility] at any time to reschedule. Right now she is still schedule to have her MRI today 11/08/16.

## 2016-11-15 ENCOUNTER — Ambulatory Visit
Admission: RE | Admit: 2016-11-15 | Discharge: 2016-11-15 | Disposition: A | Discharge status: Home / Self Care | Payer: Medicaid Other | Source: Ambulatory Visit | Attending: Neurology | Admitting: Neurology

## 2016-11-15 DIAGNOSIS — R51 Headache: Principal | ICD-10-CM

## 2016-11-15 DIAGNOSIS — R519 Headache, unspecified: Secondary | ICD-10-CM

## 2016-11-15 DIAGNOSIS — G319 Degenerative disease of nervous system, unspecified: Secondary | ICD-10-CM

## 2016-11-15 MED ORDER — GADOBENATE DIMEGLUMINE 529 MG/ML IV SOLN
20.0000 mL | Freq: Once | INTRAVENOUS | Status: AC | PRN
  Administered 2016-11-15: 17 mL via INTRAVENOUS

## 2016-11-17 ENCOUNTER — Telehealth: Payer: Self-pay | Admitting: Neurology

## 2016-11-17 NOTE — Telephone Encounter (Signed)
Please call with MRI brain results.

## 2016-11-18 ENCOUNTER — Telehealth: Payer: Self-pay | Admitting: *Deleted

## 2016-11-18 NOTE — Telephone Encounter (Signed)
Patient returning nurse's call.  Please call

## 2016-11-18 NOTE — Telephone Encounter (Signed)
See result note - thanks

## 2016-11-18 NOTE — Telephone Encounter (Signed)
Pt returned RN's call. Msg relayed, she understood and did not have any questions.  Thank you

## 2016-11-18 NOTE — Telephone Encounter (Signed)
Noted, thank you

## 2016-11-18 NOTE — Telephone Encounter (Signed)
LVM trying to return pt call. Ok to inform pt: "MRI of the brain is unremarkable and unchanged from CT scan 05/2016. No reason in the brain seen for her headaches which is good news" per Dr Jaynee Eagles, thank you

## 2016-11-18 NOTE — Telephone Encounter (Signed)
LVM for pt to call about results. Gave [facility] phone number.  

## 2016-11-18 NOTE — Telephone Encounter (Signed)
-----   Message from Melvenia Beam, MD sent at 11/18/2016 12:10 PM EST ----- MRI of the brain is unremarkable and unchanged from CT scan 05/2016. No reason in the brain seen for her headaches which is good news thanks

## 2016-12-29 ENCOUNTER — Encounter: Payer: Self-pay | Admitting: Neurology

## 2016-12-31 ENCOUNTER — Encounter: Payer: Self-pay | Admitting: Neurology

## 2016-12-31 ENCOUNTER — Telehealth: Payer: Self-pay | Admitting: Neurology

## 2016-12-31 NOTE — Telephone Encounter (Signed)
I placed a referral for sleep eval on 1/9 of this year. Patient has a follow up with me on March 20th which we should cancel as she needs to follow up with a sleep eval first. I don;t see any sleep evaluation scheduled. Would you please look into this and get her scheduled and cancel the appt on the 20th with me? thanks

## 2016-12-31 NOTE — Telephone Encounter (Signed)
Follow-up appt on 3/20 w/ Dr. Jaynee Eagles cancelled until after sleep eval. Pt notified via [place].

## 2017-01-07 ENCOUNTER — Ambulatory Visit: Payer: Medicaid Other | Admitting: Neurology

## 2017-01-07 ENCOUNTER — Encounter: Payer: Self-pay | Admitting: Neurology

## 2017-01-07 ENCOUNTER — Ambulatory Visit (INDEPENDENT_AMBULATORY_CARE_PROVIDER_SITE_OTHER): Payer: Medicaid Other | Admitting: Neurology

## 2017-01-07 VITALS — BP 142/98 | HR 115 | Ht 67.5 in | Wt 195.0 lb

## 2017-01-07 DIAGNOSIS — R51 Headache: Secondary | ICD-10-CM | POA: Diagnosis not present

## 2017-01-07 DIAGNOSIS — R519 Headache, unspecified: Secondary | ICD-10-CM

## 2017-01-07 MED ORDER — AMITRIPTYLINE HCL 10 MG PO TABS: 10.0000 mg | ORAL_TABLET | Freq: Every day | ORAL | 12 refills | Status: DC

## 2017-01-07 NOTE — Patient Instructions (Signed)
Amitriptyline tablets What is this medicine? AMITRIPTYLINE (a mee TRIP ti leen) is used to treat depression. This medicine may be used for other purposes; ask your health care provider or pharmacist if you have questions. COMMON BRAND NAME(S): Elavil, Vanatrip What should I tell my health care provider before I take this medicine? They need to know if you have any of these conditions: -an alcohol problem -asthma, difficulty breathing -bipolar disorder or schizophrenia -difficulty passing urine, prostate trouble -glaucoma -heart disease or previous heart attack -liver disease -over active thyroid -seizures -thoughts or plans of suicide, a previous suicide attempt, or family history of suicide attempt -an unusual or allergic reaction to amitriptyline, other medicines, foods, dyes, or preservatives -pregnant or trying to get pregnant -breast-feeding How should I use this medicine? Take this medicine by mouth with a drink of water. Follow the directions on the prescription label. You can take the tablets with or without food. Take your medicine at regular intervals. Do not take it more often than directed. Do not stop taking this medicine suddenly except upon the advice of your doctor. Stopping this medicine too quickly may cause serious side effects or your condition may worsen. A special MedGuide will be given to you by the pharmacist with each prescription and refill. Be sure to read this information carefully each time. Talk to your pediatrician regarding the use of this medicine in children. Special care may be needed. Overdosage: If you think you have taken too much of this medicine contact a poison control center or emergency room at once. NOTE: This medicine is only for you. Do not share this medicine with others. What if I miss a dose? If you miss a dose, take it as soon as you can. If it is almost time for your next dose, take only that dose. Do not take double or extra doses. What  may interact with this medicine? Do not take this medicine with any of the following medications: -arsenic trioxide -certain medicines used to regulate abnormal heartbeat or to treat other heart conditions -cisapride -droperidol -halofantrine -linezolid -MAOIs like Carbex, Eldepryl, Marplan, Nardil, and Parnate -methylene blue -other medicines for mental depression -phenothiazines like perphenazine, thioridazine and chlorpromazine -pimozide -probucol -procarbazine -sparfloxacin -St. John's Wort -ziprasidone This medicine may also interact with the following medications: -atropine and related drugs like hyoscyamine, scopolamine, tolterodine and others -barbiturate medicines for inducing sleep or treating seizures, like phenobarbital -cimetidine -disulfiram -ethchlorvynol -thyroid hormones such as levothyroxine This list may not describe all possible interactions. Give your health care provider a list of all the medicines, herbs, non-prescription drugs, or dietary supplements you use. Also tell them if you smoke, drink alcohol, or use illegal drugs. Some items may interact with your medicine. What should I watch for while using this medicine? Tell your doctor if your symptoms do not get better or if they get worse. Visit your doctor or health care professional for regular checks on your progress. Because it may take several weeks to see the full effects of this medicine, it is important to continue your treatment as prescribed by your doctor. Patients and their families should watch out for new or worsening thoughts of suicide or depression. Also watch out for sudden changes in feelings such as feeling anxious, agitated, panicky, irritable, hostile, aggressive, impulsive, severely restless, overly excited and hyperactive, or not being able to sleep. If this happens, especially at the beginning of treatment or after a change in dose, call your health care professional. You may get   drowsy or  dizzy. Do not drive, use machinery, or do anything that needs mental alertness until you know how this medicine affects you. Do not stand or sit up quickly, especially if you are an older patient. This reduces the risk of dizzy or fainting spells. Alcohol may interfere with the effect of this medicine. Avoid alcoholic drinks. Do not treat yourself for coughs, colds, or allergies without asking your doctor or health care professional for advice. Some ingredients can increase possible side effects. Your mouth may get dry. Chewing sugarless gum or sucking hard candy, and drinking plenty of water will help. Contact your doctor if the problem does not go away or is severe. This medicine may cause dry eyes and blurred vision. If you wear contact lenses you may feel some discomfort. Lubricating drops may help. See your eye doctor if the problem does not go away or is severe. This medicine can cause constipation. Try to have a bowel movement at least every 2 to 3 days. If you do not have a bowel movement for 3 days, call your doctor or health care professional. This medicine can make you more sensitive to the sun. Keep out of the sun. If you cannot avoid being in the sun, wear protective clothing and use sunscreen. Do not use sun lamps or tanning beds/booths. What side effects may I notice from receiving this medicine? Side effects that you should report to your doctor or health care professional as soon as possible: -allergic reactions like skin rash, itching or hives, swelling of the face, lips, or tongue -anxious -breathing problems -changes in vision -confusion -elevated mood, decreased need for sleep, racing thoughts, impulsive behavior -eye pain -fast, irregular heartbeat -feeling faint or lightheaded, falls -feeling agitated, angry, or irritable -fever with increased sweating -hallucination, loss of contact with reality -seizures -stiff muscles -suicidal thoughts or other mood  changes -tingling, pain, or numbness in the feet or hands -trouble passing urine or change in the amount of urine -trouble sleeping -unusually weak or tired -vomiting -yellowing of the eyes or skin Side effects that usually do not require medical attention (report to your doctor or health care professional if they continue or are bothersome): -change in sex drive or performance -change in appetite or weight -constipation -dizziness -dry mouth -nausea -tired -tremors -upset stomach This list may not describe all possible side effects. Call your doctor for medical advice about side effects. You may report side effects to FDA at 1-800-FDA-1088. Where should I keep my medicine? Keep out of the reach of children. Store at room temperature between 20 and 25 degrees C (68 and 77 degrees F). Throw away any unused medicine after the expiration date. NOTE: This sheet is a summary. It may not cover all possible information. If you have questions about this medicine, talk to your doctor, pharmacist, or health care provider.  2018 Elsevier/Gold Standard (2016-03-08 12:14:15)  

## 2017-01-07 NOTE — Progress Notes (Signed)
[facility]    Provider:  Dr Jaynee Eagles Referring Provider: Elbert Ewings, FNP Primary Care Physician:  No PCP Per Patient  CC:  Headache  Interval update 01/07/2017: still waking with headaches. Snoring. Daily headaches. Excessive fatigue. I had ordered a sleep eval unclear why it was not completed will follow up. She could not afford the depakote or the propranolol. Discussed other options,  Will try amitriptyline at night.    HPI:  [name] is a 56 y.o. female here as a referral from Dr. Rowe Robert for headaches. No significant PMHx of headaches or migraines prior to the start of these 5 months ago. Patient is on Depakote and propranolol. Past medical history  depression, anxiety, hypertension. Headaches start around the eyes or can be unilateral, they are pounding and throbbing, started at least 5 months ago. She was seen at the emergency room this past August which was one of the first headaches. She was walking to the pharmacy and her headache was severe. No light or sound sensitivity, no smell triggers, no nausea or vomiting, can be so severe it makes her cry. They went to the emergency room. She was given multiple medications. She has headaches every day. She wakes up with headaches and especially when sleeping. She has woken herself up snoring. She has nocturnal or morning headache 4/7 days but also happen during the day. No other focal neurologic deficits, associated symptoms, inciting events, modifiable factors. No vision changes, speech changes, weakness, neck pain or stiffness, fevers, chills. She has gained weight.   Reviewed notes, labs and imaging from outside physicians, which showed:  Patient presented to the emergency department with a headache on 06/20/2016. At the time she denied any significant headaches history. She denied photophobia and phonophobia. No history of migraines. No history of cancer. Denied weakness of her arms or legs. No fevers or chills,  neck pain or stiffness, she tried medication prior to arrival. 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 Patient was complaining of severe headache. This was in the setting of elevated blood pressure. She took ibuprofen and indomethacin which did not help. She was described as a frontal headache that began about 3 AM. She was given dye lauded and Reglan and Toradol and morphine.   CT of the head 06/20/2016 showed (Personally reviewed images and agree with the following):  IMPRESSION: 1. No acute intracranial abnormalities. 2. 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 3. 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   BMP July 2017 was unremarkable, as was CBC  Review of Systems: Patient complains of symptoms per HPI as well as the following symptoms: Weight gain, fatigue, blurred vision, shortness of breath, cough, feeling cold, feeling hot, ears, flushing, ringing in ears, incontinence, runny nose, memory loss, confusion, headache, slurred speech, insomnia, depression, anxiety, too much sleep, not enough sleep, decreased energy. Pertinent negatives per HPI. All others negative.   Social History   Social History  . Marital status: Single    Spouse name: N/A  . Number of children: 1  . Years of education: 85   Occupational History  . Unemployed    Social History Main Topics  . Smoking status: Never Smoker  . Smokeless tobacco: Never Used  . Alcohol use No  . Drug use: No  . Sexual activity: Not Currently    Partners: Male   Other Topics Concern  . Not on  file   Social History Narrative   Lives with son, Shanon Brow.   Caffeine use: Drinks coffee sometimes   Soda daily   Left handed    Family History  Problem Relation Age of Onset  . Headache Neg Hx     Past Medical History:  Diagnosis Date  . Anxiety   . Depression   .  Hypertension   . Migraines     Past Surgical History:  Procedure Laterality Date  . NO PAST SURGERIES      Current Outpatient Prescriptions  Medication Sig Dispense Refill  . Cholecalciferol (VITAMIN D) 2000 units tablet Take 2,000 Units by mouth daily.    . divalproex (DEPAKOTE) 500 MG DR tablet Take one tablet in the morning and 2 at night 90 tablet 6  . indomethacin (INDOCIN) 25 MG capsule Take 25 mg by mouth 3 (three) times daily as needed (headache).    . Omega-3 Fatty Acids (FISH OIL) 1000 MG CAPS Take 1,000 mg by mouth 2 (two) times daily.    . pravastatin (PRAVACHOL) 40 MG tablet Take 1 tablet (40 mg total) by mouth daily. 90 tablet 3  . amitriptyline (ELAVIL) 10 MG tablet Take 1 tablet (10 mg total) by mouth at bedtime. 30 tablet 12  . propranolol (INDERAL) 60 MG tablet Take 1 tablet (60 mg total) by mouth 2 (two) times daily. (Patient not taking: Reported on 01/07/2017) 60 tablet 6   No current facility-administered medications for this visit.     Allergies as of 01/07/2017 - Review Complete 01/07/2017  Allergen Reaction Noted  . Iron Diarrhea 04/22/2016  . Penicillins Nausea And Vomiting 10/20/2013    Vitals: BP (!) 142/98   Pulse (!) 115   Ht 5' 7.5" (1.715 m)   Wt 195 lb (88.5 kg)   LMP 06/06/2015   BMI 30.09 kg/m  Last Weight:  Wt Readings from Last 1 Encounters:  01/07/17 195 lb (88.5 kg)   Last Height:   Ht Readings from Last 1 Encounters:  01/07/17 5' 7.5" (1.715 m)   Physical exam: Exam: Gen: NAD, conversant, well nourised, obese, well groomed                     Eyes: Conjunctivae clear without exudates or hemorrhage  Neuro: Detailed Neurologic Exam  Speech:    Speech is normal; fluent and spontaneous with normal comprehension.  Cognition:    The patient is oriented to person, place, and time;     recent and remote memory intact;     language fluent;     normal attention, concentration,     fund of knowledge Cranial Nerves:    The pupils  are equal, round, and reactive to light. Visual fields are full to finger confrontation. Extraocular movements are intact. Trigeminal sensation is intact and the muscles of mastication are normal. The face is symmetric. The palate elevates in the midline. Hearing intact. Voice is normal. Shoulder shrug is normal. The tongue has normal motion without fasciculations.   Coordination:    Normal finger to nose and heel to shin. Normal rapid alternating movements.   Gait:    Heel-toe and tandem gait are normal.   Motor Observation:    No asymmetry, no atrophy, and no involuntary movements noted. Tone:    Normal muscle tone.    Posture:    Posture is normal. normal erect    Strength:    Strength is V/V in the upper and lower limbs.  Assessment/Plan:  56 year old with nocturnal and morning headaches, obesity. She also reports snoring and waking herself up snoring. MRI brain did not show any etiology for headache.   Remember to drink plenty of fluid, eat healthy meals and do not skip any meals. Try to eat protein with a every meal and eat a healthy snack such as fruit or nuts in between meals. Try to keep a regular sleep-wake schedule and try to exercise daily, particularly in the form of walking, 20-30 minutes a day, if you can.   Sleep evaluation for OSA. ESS 10 Start Amitriptyline (could not afford depakote, propranolol)  Discussed: To prevent or relieve headaches, try the following: Cool Compress. Lie down and place a cool compress on your head.  Avoid headache triggers. If certain foods or odors seem to have triggered your migraines in the past, avoid them. A headache diary might help you identify triggers.  Include physical activity in your daily routine. Try a daily walk or other moderate aerobic exercise.  Manage stress. Find healthy ways to cope with the stressors, such as delegating tasks on your to-do list.  Practice relaxation techniques. Try deep breathing, yoga,  massage and visualization.  Eat regularly. Eating regularly scheduled meals and maintaining a healthy diet might help prevent headaches. Also, drink plenty of fluids.  Follow a regular sleep schedule. Sleep deprivation might contribute to headaches Consider biofeedback. With this mind-body technique, you learn to control certain bodily functions - such as muscle tension, heart rate and blood pressure - to prevent headaches or reduce headache pain.    Proceed to emergency room if you experience new or worsening symptoms or symptoms do not resolve, if you have new neurologic symptoms or if headache is severe, or for any concerning symptom.    Sarina Ill, MD  [facility] [address] [place], [place] [address]  Phone [phone] Fax [phone]  A total of 25 minutes was spent face-to-face with this patient. Over half this time was spent on counseling patient on the migraine diagnosis and different diagnostic and therapeutic options available.

## 2017-01-08 ENCOUNTER — Ambulatory Visit: Payer: Medicaid Other | Admitting: Family Medicine

## 2017-01-08 ENCOUNTER — Telehealth: Payer: Self-pay | Admitting: Neurology

## 2017-01-08 MED ORDER — AMITRIPTYLINE HCL 10 MG PO TABS: 10.0000 mg | ORAL_TABLET | Freq: Every day | ORAL | 12 refills | Status: DC

## 2017-01-08 NOTE — Telephone Encounter (Signed)
Rx re-sent to corrected pharmacy as requested.

## 2017-01-08 NOTE — Telephone Encounter (Signed)
Patient called office in reference to amitriptyline (ELAVIL) 10 MG tablet.  Patient would like this called into [place] per patient they have not received the refill.  Please call

## 2017-01-08 NOTE — Addendum Note (Signed)
Addended by: Monte Fantasia on: 01/08/2017 11:06 AM   Modules accepted: Orders

## 2017-01-08 NOTE — Telephone Encounter (Signed)
Pt called back said her alternate number to reach her is [phone]

## 2017-01-09 ENCOUNTER — Encounter: Payer: Self-pay | Admitting: Neurology

## 2017-01-10 ENCOUNTER — Encounter: Payer: Self-pay | Admitting: Neurology

## 2017-01-21 ENCOUNTER — Ambulatory Visit: Payer: Medicaid Other | Admitting: Family Medicine

## 2017-01-22 ENCOUNTER — Encounter: Payer: Self-pay | Admitting: Neurology

## 2017-01-23 ENCOUNTER — Ambulatory Visit (INDEPENDENT_AMBULATORY_CARE_PROVIDER_SITE_OTHER): Payer: Medicaid Other | Admitting: Neurology

## 2017-01-23 ENCOUNTER — Encounter: Payer: Self-pay | Admitting: Neurology

## 2017-01-23 VITALS — BP 130/82 | HR 98 | Resp 16 | Ht 67.5 in | Wt 197.0 lb

## 2017-01-23 DIAGNOSIS — F5105 Insomnia due to other mental disorder: Secondary | ICD-10-CM | POA: Insufficient documentation

## 2017-01-23 DIAGNOSIS — R51 Headache: Secondary | ICD-10-CM

## 2017-01-23 DIAGNOSIS — R0683 Snoring: Secondary | ICD-10-CM | POA: Diagnosis not present

## 2017-01-23 DIAGNOSIS — F519 Sleep disorder not due to a substance or known physiological condition, unspecified: Secondary | ICD-10-CM

## 2017-01-23 DIAGNOSIS — F5109 Other insomnia not due to a substance or known physiological condition: Secondary | ICD-10-CM | POA: Diagnosis not present

## 2017-01-23 DIAGNOSIS — R519 Headache, unspecified: Secondary | ICD-10-CM

## 2017-01-23 NOTE — Patient Instructions (Signed)

## 2017-01-23 NOTE — Progress Notes (Signed)
SLEEP MEDICINE CLINIC   Provider:  Larey [name], M D  Primary Care Physician:  No PCP Per Patient   Referring Provider: Melvenia Beam, MD    Chief Complaint  Patient presents with  . Sleep Consult    Rm 10. Patient has trouble falling and staying asleep, snores, wakes up feeling tired, morning headaches, daytime fatigue, takes naps.     HPI:  [name] is a 56 y.o. female , seen here as in a referral  from [name] for evaluation of possibly sleep related headaches.   Chief complaint according to patient : The patient describes that she has trouble falling asleep and staying asleep but she also wakes frequently up with headaches.  Sleep habits are as follows: [name] describes her bedtime is usually around 9 PM and she goes to bed resting in supine position it is later during the night when she is and that usually on her right side. Her bedroom is described as cool, quiet and dark. She sleeps alone does not share the bedroom. She sleeps on multiple pillows. It will often take her an hour to go to sleep, and once asleep she will only sleep for about 2-3 hours before waking for the first time. She is usually woken by the urge to urinate and goes to the bathroom. She then sometimes struggles to go back to sleep and will take another hour. Her total nocturnal sleep time was estimated at 5 hours. She rises in the morning at 7 AM, spontaneously waking up this time. It is a true struggle for her to get out of bed and she describes herself as neither restored normal refreshed by sleep. From early morning on she feels that she needs more sleep and increased time for a nap. After lunchtime she usually takes a nap. The sleep will be as long as 2 hours and she does recall dreaming during those. Her sleep troubles begun in her early fourties, she became homeless in 2016 , sleeping rough, her son  ( now 24 ) was with her. She never got over the fear and hypervigilance, and she never had  another job.   Sleep medical history and family sleep history: parents are both deceased as is older brother. No sleep history.   Social history: lost her job at [employer] in 2010, became homeless in 2016. Lives with her adult son, who is disabled.  She does not use tobacco products of any kind, she does not drink alcohol, she does use low-dose, ice tea and coffee products. She usually drinks 2-3 sodas a day and one or 2 coffees in AM.   [name] note from 01/07/2017: still waking with headaches. Snoring. Daily headaches. Excessive fatigue. I had ordered a sleep eval unclear why it was not completed will follow up. She could not afford the depakote or the propranolol. Discussed other options,  Will try amitriptyline at night.  HPI:  [name] is a 56 y.o. female here as a referral from [name] for headaches. No significant PMHx of headaches or migraines prior to the start of these 5 months ago. Patient is on Depakote and propranolol. Past medical history  depression, anxiety, hypertension. Headaches start around the eyes or can be unilateral, they are pounding and throbbing, started at least 5 months ago. She was seen at the emergency room this past August which was one of the first headaches. She was walking to the pharmacy and her headache was severe. No  light or sound sensitivity, no smell triggers, no nausea or vomiting, can be so severe it makes her cry. They went to the emergency room. She was given multiple medications. She has headaches every day. She wakes up with headaches and especially when sleeping. She has woken herself up snoring. She has nocturnal or morning headache 4/7 days but also happen during the day. No other focal neurologic deficits, associated symptoms, inciting events, modifiable factors. No vision changes, speech changes, weakness, neck pain or stiffness, fevers, chills. She has gained weight.   Reviewed notes, labs and imaging from outside physicians, which  showed:  Patient presented to the emergency department with a headache on 06/20/2016. At the time she denied any significant headaches history. She denied photophobia and phonophobia. No history of migraines. No history of cancer. Denied weakness of her arms or legs. No fevers or chills, neck pain or stiffness, she tried medication prior to arrival. 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 Patient was complaining of severe headache. This was in the setting of elevated blood pressure. She took ibuprofen and indomethacin which did not help. She was described as a frontal headache that began about 3 AM. She was given dye lauded and Reglan and Toradol and morphine.    Review of Systems: Out of a complete 14 system review, the patient complains of only the following symptoms, and all other reviewed systems are negative.  High caffeine use, palpitations, nightly diaphoresis/ clammy. Headaches in AM , menopausal hot flushes. Snoring,  Epworth score 12  , Fatigue severity score 35  , depression score ; patient feels depressed.    Social History   Social History  . Marital status: Single    Spouse name: N/A  . Number of children: 1  . Years of education: 109   Occupational History  . Unemployed    Social History Main Topics  . Smoking status: Never Smoker  . Smokeless tobacco: Never Used  . Alcohol use No  . Drug use: No  . Sexual activity: Not Currently    Partners: Male   Other Topics Concern  . Not on file   Social History Narrative   Lives with son, [name].   Caffeine use: Daily       Left handed    Family History  Problem Relation Age of Onset  . Headache Neg Hx     Past Medical History:  Diagnosis Date  . Anxiety   . Depression   . Hypertension   . Migraines     Past Surgical History:  Procedure Laterality Date  . NO PAST SURGERIES      Current Outpatient Prescriptions  Medication Sig Dispense Refill  . amitriptyline  (ELAVIL) 10 MG tablet Take 1 tablet (10 mg total) by mouth at bedtime. 30 tablet 12  . Cholecalciferol (VITAMIN D) 2000 units tablet Take 2,000 Units by mouth daily.    . divalproex (DEPAKOTE) 500 MG Patricia tablet Take one tablet in the morning and 2 at night 90 tablet 6  . escitalopram (LEXAPRO) 10 MG tablet Take 10 mg by mouth at bedtime.  2  . indomethacin (INDOCIN) 25 MG capsule Take 25 mg by mouth 3 (three) times daily as needed (headache).    . Omega-3 Fatty Acids (FISH OIL) 1000 MG CAPS Take 1,000 mg by mouth 2 (two) times daily.    . pravastatin (PRAVACHOL) 40 MG tablet Take 1 tablet (40 mg total) by mouth daily. 90 tablet 3  .  propranolol (INDERAL) 60 MG tablet Take 1 tablet (60 mg total) by mouth 2 (two) times daily. 60 tablet 6   No current facility-administered medications for this visit.     Allergies as of 01/23/2017 - Review Complete 01/23/2017  Allergen Reaction Noted  . Iron Diarrhea 04/22/2016  . Penicillins Nausea And Vomiting 10/20/2013    Vitals: BP 130/82   Pulse 98   Resp 16   Ht 5' 7.5" (1.715 m)   Wt 197 lb (89.4 kg)   LMP 06/06/2015   BMI 30.40 kg/m  Last Weight:  Wt Readings from Last 1 Encounters:  01/23/17 197 lb (89.4 kg)   OZD:GUYQ mass index is 30.4 kg/m.     Last Height:   Ht Readings from Last 1 Encounters:  01/23/17 5' 7.5" (1.715 m)    Physical exam:  General: The patient is awake, alert and appears not in acute distress. The patient is well groomed. Head: Normocephalic, atraumatic. Neck is supple. Mallampati 4  neck circumference:16. Nasal airflow patent , TMJ : No click evident . Retrognathia is not seen.  Cardiovascular:  Regular rate and rhythm , without  murmurs or carotid bruit, and without distended neck veins. Respiratory: Lungs are clear to auscultation. Skin:  Without evidence of edema, or rash Trunk: BMI is 30 The patient's posture is stooped.   Neurologic exam : The patient is awake and alert, oriented to place and time.      Attention span & concentration ability appears normal.  Speech is slow-fluent,  without  dysarthria, dysphonia or aphasia.  Mood and affect are depressed.  Cranial nerves: Pupils are equal and briskly reactive to light.  Extraocular movements  in vertical and horizontal planes intact and without nystagmus. Visual fields by finger perimetry are intact. Hearing to finger rub intact.   Facial sensation intact to fine touch.  Facial motor strength is symmetric and tongue and uvula move midline. The patient has little facial expression. Shoulder shrug was symmetrical.   Motor exam: Normal tone, muscle bulk and symmetric strength in all extremities. Sensory:  Fine touch, pinprick and vibration . Proprioception tested in the upper extremities was normal. Coordination: Rapid alternating movements and Finger-to-nose maneuver intact without evidence of ataxia, dysmetria or tremor.  Gait and station: Patient walks without assistive device and is able unassisted to climb up to the exam table. Strength within normal limits.  Stance is stable and normal.   Deep tendon reflexes: in the  upper and lower extremities are symmetric and intact. Babinski maneuver response is downgoing.  Dear Berta Minor, Thank you for entrusting this patient to my care. As you know she had difficulties affording Depakote and propranolol but you had tried amitriptyline. Amitriptyline and actually has helped the patient quite significantly her CT of the head from 06/20/2016 showed no acute intracranial abnormality she had failed before ibuprofen, indomethacin, Reglan and Toradol and morphine, but one time she was given Dilaudid in the emergency room. Given her overall symptoms I think she has a high risk of having obstructive sleep apnea and that could be a correlation to her headaches especially Zosyn the morning dose that wake her up at night. Sleep is also described is fragmented by sleep gasping the feeling of choking for air. I will  order a split night polysomnography for this patient endorsed the Epworth Sleepiness Scale at 12 points. She is aware that she snores but her son has not told her if she has apnea or not. Reported sleep choking usually it is  a symptom of apnea.  Assessment:  After physical and neurologic examination, review of laboratory studies,  Personal review of imaging studies, reports of other /same  Imaging studies, results of polysomnography and / or neurophysiology testing and pre-existing records as far as provided in visit., my assessment is   1) high risk for OSA, snoring, choking and nocturia. Patient is overweight, too.   2) morning headaches.   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 .   The patient was advised of the nature of the diagnosed disorder , the treatment options and the  risks for general health and wellness arising from not treating the condition.   I spent more than 45 minutes of face to face time with the patient.  Greater than 50% of time was spent in counseling and coordination of care. We have discussed the diagnosis and differential and I answered the patient's questions.    Plan:  Treatment plan and additional workup :  SPLIT night with capnography during the diagnostic part.    RV with either Np or me after study is interpreted.   Patricia Seat, MD 02/23/3747, 2:70 PM  Certified in Neurology by ABPN Certified in [place] by [facility] [address] [place], [place] [address]

## 2017-01-27 ENCOUNTER — Ambulatory Visit (INDEPENDENT_AMBULATORY_CARE_PROVIDER_SITE_OTHER): Payer: Medicaid Other | Admitting: Neurology

## 2017-01-27 DIAGNOSIS — R519 Headache, unspecified: Secondary | ICD-10-CM

## 2017-01-27 DIAGNOSIS — F5105 Insomnia due to other mental disorder: Secondary | ICD-10-CM

## 2017-01-27 DIAGNOSIS — R0683 Snoring: Secondary | ICD-10-CM

## 2017-01-27 DIAGNOSIS — F519 Sleep disorder not due to a substance or known physiological condition, unspecified: Secondary | ICD-10-CM

## 2017-01-27 DIAGNOSIS — R51 Headache: Secondary | ICD-10-CM

## 2017-01-27 DIAGNOSIS — G4733 Obstructive sleep apnea (adult) (pediatric): Secondary | ICD-10-CM | POA: Diagnosis not present

## 2017-01-27 DIAGNOSIS — F5109 Other insomnia not due to a substance or known physiological condition: Secondary | ICD-10-CM

## 2017-01-29 ENCOUNTER — Telehealth: Payer: Self-pay | Admitting: General Practice

## 2017-01-29 NOTE — Telephone Encounter (Signed)
Left patient a message informing her that provider is not in the office on 4/20, when we scheduled her appt. Asked her to call us to reschedule.

## 2017-02-02 ENCOUNTER — Encounter: Payer: Self-pay | Admitting: Neurology

## 2017-02-02 NOTE — Procedures (Signed)
PATIENT'S NAME:  [name], [name] DOB:      [birth year]      MR#:    [id number]     DATE OF RECORDING: 01/27/2017 REFERRING M.D.:  Sarina Ill MD Study Performed:   Baseline Polysomnogram HISTORY:  Mrs. Emerick sleeps on multiple pillows. It will often take her an hour to go to sleep, and once asleep she will only sleep for about 2-3 hours before waking for the first time. She is usually woken by the urge to urinate and goes to the bathroom. She struggles to go back to sleep and will take another hour. Her total nocturnal sleep time was estimated at 5 hours. She rises in the morning at 7 AM, spontaneously. It is a true struggle for her to get out of bed and she describes herself as neither restored normal refreshed by sleep. After lunchtime she usually takes a nap. The sleep will be as long as 2 hours and she does recall dreaming during those. Her sleep troubles begun in her early forties, she became homeless in 2016, sleeping rough, her  disabled son (now 39) was with her. She never got over the hypervigilance at night.  Anxiety, bipolar depression, hypertension and migraines The patient endorsed the Epworth Sleepiness Scale at 12/24 points.   The patient's weight 197 pounds with a height of 68 (inches), resulting in a BMI of 30.1 kg/m2. The patient's neck circumference measured 16 inches.  CURRENT MEDICATIONS: Amitriptyline, Cholecalciferol, Divolproex, Escitalopram, Ondomethacin, Omega-3, Pravastatin and Propranolol   PROCEDURE:  This is a multichannel digital polysomnogram utilizing the Somnostar 11.2 system.  Electrodes and sensors were applied and monitored per AASM Specifications.   EEG, EOG, Chin and Limb EMG, were sampled at 200 Hz.  ECG, Snore and Nasal Pressure, Thermal Airflow, Respiratory Effort, CPAP Flow and Pressure, Oximetry was sampled at 50 Hz. Digital video and audio were recorded.      BASELINE STUDY  Lights Out was at 21:47 and Lights On at 04:59.  Total recording time (TRT) was  432.5 minutes, with a total sleep time (TST) of  344 minutes.   The patient's sleep latency was 43 minutes.    The sleep efficiency was 79.5 %.     SLEEP ARCHITECTURE: WASO (Wake after sleep onset) was 45.5 minutes.  There were 23.5 minutes in Stage N1, 320.5 minutes Stage N2, 0 minutes Stage N3 and 0 minutes in Stage REM.  The percentage of Stage N1 was 6.8%, Stage N2 was 93.2%, Stage N3 was 0% and Stage R (REM sleep) was 0%.   RESPIRATORY ANALYSIS:  There were a total of 47 respiratory events:  0 obstructive apneas, 0 central apneas and 0 mixed apneas with a total of 0 apneas and an apnea index (AI) of 0 /hour. There were 47 hypopneas with a hypopnea index of 8.2 /hour. The patient also had 0 respiratory event related arousals (RERAs).  The total APNEA/HYPOPNEA INDEX (AHI) was 8.2/hour and the total RESPIRATORY DISTURBANCE INDEX was 8.2 /hour.  0 events occurred in REM sleep and 94 events in NREM. The REM AHI was 0 /hour, versus a non-REM AHI of 8.2. The patient spent 105.5 minutes of total sleep time in the supine position and 239 minutes in non-supine.. The supine AHI was 26.7 versus a non-supine AHI of 0.0.  OXYGEN SATURATION & C02:  The Wake baseline 02 saturation was 97%, with the lowest being 89%. Time spent below 89% saturation equaled 0 minutes. Average End Tidal CO2 during sleep was 33.4  torr.  During NREM, the average End Tidal CO2 during sleep was 33.4 torr.     PERIODIC LIMB MOVEMENTS:   The patient had a total of 7 Periodic Limb Movements.  The Periodic Limb Movement (PLM) index was 1.2 and the PLM Arousal index was 0.2/hour. The arousals were noted as: 30 were spontaneous, 1 associated with PLMs, and 47 were associated with respiratory events.  Audio and video analysis did not show any abnormal or unusual movements, behaviors, phonations or vocalizations.   The patient took no bathroom breaks. Snoring was noted, accentuated in supine position.. EKG was in keeping with normal sinus  rhythm (NSR). Post-study, the patient indicated that sleep was the same as usual.    IMPRESSION: Mild Obstructive Sleep Apnea(OSA) at AHI of 8.2 , with supine accentuation in AHI and snoring. Supine AHIwas 26.7/hr. REM sleep was not captured.  1. No hypoxemia or  hypercapnia of clinical significance noted.  2. Reported anxiety/ hypervigilance at night needs to be addressed with a psyhciological counselor.   RECOMMENDATIONS:  1. Given this patients  degree of daytime sleepiness , I will advise avoiding the supine sleep position . The Tennis ball method should be introduced.   2. Advise to lose weight, diet and exercise if not contraindicated (BMI 30.1 ). 3. ENT examination as clinically indicated if primary snoring is of clinical concern. 4. Further information regarding OSA may be obtained from [employer] (www.sleepfoundation.org) or American Sleep Apnea Association (www.sleepapnea.org). 5. Consider dedicated sleep psychology referral if insomnia is of clinical concern.   6. A follow up appointment will be scheduled in the Sleep Clinic at [facility]. The referring provider will be notified of the results.     I certify that I have reviewed the entire raw data recording prior to the issuance of this report in accordance with the Standards of Accreditation of the American Academy of Sleep Medicine (AASM)   Larey Seat, MD     02-02-2017  Diplomat, American Board of Psychiatry and Neurology  Diplomat, American Board of [place] Director, [employer] [employer]

## 2017-02-03 ENCOUNTER — Institutional Professional Consult (permissible substitution): Payer: Medicaid Other | Admitting: Neurology

## 2017-02-05 ENCOUNTER — Telehealth: Payer: Self-pay

## 2017-02-05 NOTE — Telephone Encounter (Signed)
-----   Message from Larey Seat, MD sent at 02/02/2017 12:30 PM EDT ----- Patient can likely archive control of snoring and mild apnea by avoiding to sleep supine. She should try to lose weight.  If this does not work for her, ENT or dentist referral shall be considered.

## 2017-02-05 NOTE — Telephone Encounter (Signed)
I spoke to patient and she is aware of results and recommendations. I sent results to referring physician.

## 2017-02-07 ENCOUNTER — Ambulatory Visit: Payer: Medicaid Other | Admitting: Family Medicine

## 2017-02-10 ENCOUNTER — Other Ambulatory Visit: Payer: Self-pay

## 2017-02-10 ENCOUNTER — Encounter: Payer: Self-pay | Admitting: Family Medicine

## 2017-02-10 ENCOUNTER — Ambulatory Visit: Payer: Medicaid Other | Attending: Family Medicine | Admitting: Family Medicine

## 2017-02-10 VITALS — BP 138/89 | HR 117 | Temp 98.1°F | Resp 18 | Ht 67.0 in | Wt 198.2 lb

## 2017-02-10 DIAGNOSIS — Z8249 Family history of ischemic heart disease and other diseases of the circulatory system: Secondary | ICD-10-CM | POA: Diagnosis not present

## 2017-02-10 DIAGNOSIS — R002 Palpitations: Secondary | ICD-10-CM | POA: Diagnosis not present

## 2017-02-10 DIAGNOSIS — G44209 Tension-type headache, unspecified, not intractable: Secondary | ICD-10-CM | POA: Insufficient documentation

## 2017-02-10 DIAGNOSIS — F5105 Insomnia due to other mental disorder: Secondary | ICD-10-CM | POA: Insufficient documentation

## 2017-02-10 DIAGNOSIS — I10 Essential (primary) hypertension: Secondary | ICD-10-CM | POA: Diagnosis not present

## 2017-02-10 DIAGNOSIS — Z79899 Other long term (current) drug therapy: Secondary | ICD-10-CM | POA: Diagnosis not present

## 2017-02-10 MED ORDER — AMITRIPTYLINE HCL 25 MG PO TABS: 25.0000 mg | ORAL_TABLET | Freq: Every day | ORAL | 1 refills | Status: DC

## 2017-02-10 MED ORDER — IBUPROFEN 600 MG PO TABS: 600.0000 mg | ORAL_TABLET | Freq: Three times a day (TID) | ORAL | 0 refills | Status: DC | PRN

## 2017-02-10 MED ORDER — PROPRANOLOL HCL 80 MG PO TABS: 80.0000 mg | ORAL_TABLET | Freq: Two times a day (BID) | ORAL | 2 refills | Status: DC

## 2017-02-10 NOTE — Patient Instructions (Addendum)
You will be called with your labs results.   Hypertension Hypertension is another name for high blood pressure. High blood pressure forces your heart to work harder to pump blood. This can cause problems over time. There are two numbers in a blood pressure reading. There is a top number (systolic) over a bottom number (diastolic). It is best to have a blood pressure below 120/80. Healthy choices can help lower your blood pressure. You may need medicine to help lower your blood pressure if:  Your blood pressure cannot be lowered with healthy choices.  Your blood pressure is higher than 130/80. Follow these instructions at home: Eating and drinking   If directed, follow the DASH eating plan. This diet includes:  Filling half of your plate at each meal with fruits and vegetables.  Filling one quarter of your plate at each meal with whole grains. Whole grains include whole wheat pasta, brown rice, and whole grain bread.  Eating or drinking low-fat dairy products, such as skim milk or low-fat yogurt.  Filling one quarter of your plate at each meal with low-fat (lean) proteins. Low-fat proteins include fish, skinless chicken, eggs, beans, and tofu.  Avoiding fatty meat, cured and processed meat, or chicken with skin.  Avoiding premade or processed food.  Eat less than 1,500 mg of salt (sodium) a day.  Limit alcohol use to no more than 1 drink a day for nonpregnant women and 2 drinks a day for men. One drink equals 12 oz of beer, 5 oz of wine, or 1 oz of hard liquor. Lifestyle   Work with your doctor to stay at a healthy weight or to lose weight. Ask your doctor what the best weight is for you.  Get at least 30 minutes of exercise that causes your heart to beat faster (aerobic exercise) most days of the week. This may include walking, swimming, or biking.  Get at least 30 minutes of exercise that strengthens your muscles (resistance exercise) at least 3 days a week. This may include  lifting weights or pilates.  Do not use any products that contain nicotine or tobacco. This includes cigarettes and e-cigarettes. If you need help quitting, ask your doctor.  Check your blood pressure at home as told by your doctor.  Keep all follow-up visits as told by your doctor. This is important. Medicines   Take over-the-counter and prescription medicines only as told by your doctor. Follow directions carefully.  Do not skip doses of blood pressure medicine. The medicine does not work as well if you skip doses. Skipping doses also puts you at risk for problems.  Ask your doctor about side effects or reactions to medicines that you should watch for. Contact a doctor if:  You think you are having a reaction to the medicine you are taking.  You have headaches that keep coming back (recurring).  You feel dizzy.  You have swelling in your ankles.  You have trouble with your vision. Get help right away if:  You get a very bad headache.  You start to feel confused.  You feel weak or numb.  You feel faint.  You get very bad pain in your:  Chest.  Belly (abdomen).  You throw up (vomit) more than once.  You have trouble breathing. Summary  Hypertension is another name for high blood pressure.  Making healthy choices can help lower blood pressure. If your blood pressure cannot be controlled with healthy choices, you may need to take medicine. This information is  not intended to replace advice given to you by your health care provider. Make sure you discuss any questions you have with your health care provider. Document Released: 03/25/2008 Document Revised: 09/04/2016 Document Reviewed: 09/04/2016 Elsevier Interactive Patient Education  2017 [employer].

## 2017-02-10 NOTE — Progress Notes (Signed)
Subjective:  Patient ID: [name], female    DOB: [birth year]  Age: 56 y.o. MRN: [id number]  CC: Establish Care   HPI CHE BELOW presents for   Headaches: Worsening headaches for 2 months. Denies any vision changes, difficulty keeping her balance, nausea or vomiting, or photophobia. Does report stress. Reports financial constraints and family as stressors. Reports taking over-the-counter ibuprofen with minimal relief of symptoms.  Depression: History of depression she reports being managed by psychiatric services outpatient. Denies any SI/HI. Reports symptoms of insomnia for 2 months denies taking anything for symptoms.  Palpitations: History of palpitations for close to 1 month. Positive cardiac family history. Reports Brother died of aortic dissection. Reports palpitations occur at rest and with activity. Denies any chest pain,shortness of breath, near syncope, or swelling of the bilateral lower extremities.    Outpatient Medications Prior to Visit  Medication Sig Dispense Refill  . Cholecalciferol (VITAMIN D) 2000 units tablet Take 2,000 Units by mouth daily.    . divalproex (DEPAKOTE) 500 MG DR tablet Take one tablet in the morning and 2 at night 90 tablet 6  . escitalopram (LEXAPRO) 10 MG tablet Take 10 mg by mouth at bedtime.  2  . indomethacin (INDOCIN) 25 MG capsule Take 25 mg by mouth 3 (three) times daily as needed (headache).    . Omega-3 Fatty Acids (FISH OIL) 1000 MG CAPS Take 1,000 mg by mouth 2 (two) times daily.    . pravastatin (PRAVACHOL) 40 MG tablet Take 1 tablet (40 mg total) by mouth daily. 90 tablet 3  . amitriptyline (ELAVIL) 10 MG tablet Take 1 tablet (10 mg total) by mouth at bedtime. 30 tablet 12  . propranolol (INDERAL) 60 MG tablet Take 1 tablet (60 mg total) by mouth 2 (two) times daily. 60 tablet 6   No facility-administered medications prior to visit.     ROS Review of Systems  Constitutional: Negative.   Eyes: Negative.   Respiratory:  Negative.   Cardiovascular: Positive for palpitations.  Gastrointestinal: Negative.   Endocrine: Negative.   Skin: Negative.   Neurological: Positive for headaches.  Psychiatric/Behavioral: Positive for sleep disturbance.   Objective:  BP 138/89 (BP Location: Left Arm, Patient Position: Sitting, Cuff Size: Normal)   Pulse (!) 117   Temp 98.1 F (36.7 C) (Oral)   Resp 18   Ht 5' 7"  (1.702 m)   Wt 198 lb 3.2 oz (89.9 kg)   LMP 06/06/2015   SpO2 96%   BMI 31.04 kg/m   BP/Weight 02/10/2017 01/23/2017 6/81/1572  Systolic BP 620 355 974  Diastolic BP 89 82 98  Wt. (Lbs) 198.2 197 195  BMI 31.04 30.4 30.09   Physical Exam  HENT:  Head: Normocephalic.  Right Ear: External ear normal.  Left Ear: External ear normal.  Nose: Nose normal.  Mouth/Throat: Oropharynx is clear and moist.  Eyes: Conjunctivae and EOM are normal. Pupils are equal, round, and reactive to light.  Neck: Normal range of motion. No JVD present.  Cardiovascular: Normal rate, regular rhythm, normal heart sounds and intact distal pulses.   Pulmonary/Chest: Effort normal and breath sounds normal.  Abdominal: Soft. Bowel sounds are normal.  Skin: Skin is warm and dry.  Nursing note and vitals reviewed.  Assessment & Plan:   Problem List Items Addressed This Visit      Other   Insomnia due to mental condition   Relevant Medications   amitriptyline (ELAVIL) 25 MG tablet    Other Visit Diagnoses  Essential hypertension    -  Primary   Schedule BP recheck in 2 weeks with nurse.   If BP is greater than 90/60 (MAP 65 or greater) but not less than 130/80 may add    Lisinopril 10 mg QD and recheck in another 2 weeks.    Relevant Medications   propranolol (INDERAL) 80 MG tablet   Other Relevant Orders   CMP14+EGFR (Completed)   Lipid Panel (Completed)   Microalbumin / creatinine urine ratio   Palpitations       -EKG performed.   Relevant Orders   CBC with Differential   TSH   Tension headache        Relevant Medications   amitriptyline (ELAVIL) 25 MG tablet   ibuprofen (ADVIL,MOTRIN) 600 MG tablet   propranolol (INDERAL) 80 MG tablet      Meds ordered this encounter  Medications  . DISCONTD: amitriptyline (ELAVIL) 25 MG tablet    Sig: Take 1 tablet (25 mg total) by mouth at bedtime.    Dispense:  60 tablet    Refill:  1    Order Specific Question:   Supervising Provider    Answer:   Tresa Garter [id number]  . DISCONTD: ibuprofen (ADVIL,MOTRIN) 600 MG tablet    Sig: Take 1 tablet (600 mg total) by mouth every 8 (eight) hours as needed (Take with food).    Dispense:  40 tablet    Refill:  0    Order Specific Question:   Supervising Provider    Answer:   Tresa Garter [id number]  . DISCONTD: propranolol (INDERAL) 80 MG tablet    Sig: Take 1 tablet (80 mg total) by mouth 2 (two) times daily.    Dispense:  60 tablet    Refill:  2    Order Specific Question:   Supervising Provider    Answer:   Tresa Garter [id number]  . amitriptyline (ELAVIL) 25 MG tablet    Sig: Take 1 tablet (25 mg total) by mouth at bedtime.    Dispense:  60 tablet    Refill:  1  . ibuprofen (ADVIL,MOTRIN) 600 MG tablet    Sig: Take 1 tablet (600 mg total) by mouth every 8 (eight) hours as needed (Take with food).    Dispense:  40 tablet    Refill:  0  . propranolol (INDERAL) 80 MG tablet    Sig: Take 1 tablet (80 mg total) by mouth 2 (two) times daily.    Dispense:  60 tablet    Refill:  2    Follow-up: Return if symptoms worsen or fail to improve. Return in about 2 weeks (around 02/24/2017), for BP check with clinic RN.  Alfonse Spruce FNP

## 2017-02-10 NOTE — Progress Notes (Signed)
Patient is here for establish care  Patient is here for HTN/migraines/ dep  Patient denies pain for today  Patient has taking her medication for today  Patient has eaten for today

## 2017-02-11 LAB — LIPID PANEL
CHOL/HDL RATIO: 8 ratio — AB (ref 0.0–4.4)
CHOLESTEROL TOTAL: 313 mg/dL — AB (ref 100–199)
HDL: 39 mg/dL — ABNORMAL LOW (ref >39)
LDL CALC: 229 mg/dL — AB (ref 0–99)
Triglycerides: 226 mg/dL — ABNORMAL HIGH (ref 0–149)
VLDL CHOLESTEROL CAL: 45 mg/dL — AB (ref 5–40)

## 2017-02-11 LAB — CMP14+EGFR
ALBUMIN: 4.6 g/dL (ref 3.5–5.5)
ALK PHOS: 78 IU/L (ref 39–117)
ALT: 20 IU/L (ref 0–32)
AST: 19 IU/L (ref 0–40)
Albumin/Globulin Ratio: 1.1 — ABNORMAL LOW (ref 1.2–2.2)
BUN / CREAT RATIO: 15 (ref 9–23)
BUN: 14 mg/dL (ref 6–24)
Bilirubin Total: 0.3 mg/dL (ref 0.0–1.2)
CALCIUM: 9.5 mg/dL (ref 8.7–10.2)
CO2: 22 mmol/L (ref 18–29)
CREATININE: 0.91 mg/dL (ref 0.57–1.00)
Chloride: 98 mmol/L (ref 96–106)
GFR calc non Af Amer: 71 mL/min/{1.73_m2} (ref >59)
GFR, EST AFRICAN AMERICAN: 82 mL/min/{1.73_m2} (ref >59)
GLUCOSE: 98 mg/dL (ref 65–99)
Globulin, Total: 4.1 g/dL (ref 1.5–4.5)
Potassium: 4 mmol/L (ref 3.5–5.2)
Sodium: 139 mmol/L (ref 134–144)
TOTAL PROTEIN: 8.7 g/dL — AB (ref 6.0–8.5)

## 2017-02-12 ENCOUNTER — Encounter: Payer: Self-pay | Admitting: Neurology

## 2017-02-14 ENCOUNTER — Other Ambulatory Visit: Payer: Self-pay | Admitting: Family Medicine

## 2017-02-14 DIAGNOSIS — E782 Mixed hyperlipidemia: Secondary | ICD-10-CM

## 2017-02-14 DIAGNOSIS — Z0000 Encounter for general adult medical examination without abnormal findings: Secondary | ICD-10-CM

## 2017-02-14 MED ORDER — ASPIRIN EC 81 MG PO TBEC: 81.0000 mg | DELAYED_RELEASE_TABLET | Freq: Every day | ORAL | 3 refills | Status: DC

## 2017-02-14 MED ORDER — ROSUVASTATIN CALCIUM 20 MG PO TABS: 20.0000 mg | ORAL_TABLET | Freq: Every day | ORAL | 0 refills | Status: DC

## 2017-02-17 ENCOUNTER — Ambulatory Visit: Payer: Medicaid Other | Attending: Family Medicine

## 2017-02-17 ENCOUNTER — Telehealth: Payer: Self-pay

## 2017-02-17 DIAGNOSIS — I10 Essential (primary) hypertension: Secondary | ICD-10-CM

## 2017-02-17 DIAGNOSIS — R002 Palpitations: Secondary | ICD-10-CM

## 2017-02-17 DIAGNOSIS — Z0000 Encounter for general adult medical examination without abnormal findings: Secondary | ICD-10-CM

## 2017-02-17 DIAGNOSIS — E782 Mixed hyperlipidemia: Secondary | ICD-10-CM

## 2017-02-17 NOTE — Telephone Encounter (Signed)
-----   Message from Alfonse Spruce, [place] sent at 02/14/2017  7:35 AM EDT ----- -Lipid levels were elevated. This can increase your risk of heart disease. You will be prescribed rosuvastatin and aspirin to help lower risk. Recommend lab recheck in 3 months. Start eating a diet low in saturated fat. Limit your intake of fried foods, red meats, and whole milk. Liver function normal Kidney function normal Schedule lab appointment for CBC, TSH, and micro albumin labs.

## 2017-02-17 NOTE — Telephone Encounter (Signed)
CMA call regarding lab results   Patient Verify DOB   Patient was aware and understood  

## 2017-02-18 LAB — CBC WITH DIFFERENTIAL/PLATELET
Basophils Absolute: 0.1 10*3/uL (ref 0.0–0.2)
Basos: 1 %
EOS (ABSOLUTE): 0.1 10*3/uL (ref 0.0–0.4)
EOS: 2 %
HEMATOCRIT: 44.3 % (ref 34.0–46.6)
Hemoglobin: 15.2 g/dL (ref 11.1–15.9)
IMMATURE GRANULOCYTES: 0 %
Immature Grans (Abs): 0 10*3/uL (ref 0.0–0.1)
LYMPHS ABS: 2.4 10*3/uL (ref 0.7–3.1)
Lymphs: 38 %
MCH: 31.4 pg (ref 26.6–33.0)
MCHC: 34.3 g/dL (ref 31.5–35.7)
MCV: 92 fL (ref 79–97)
MONOS ABS: 0.6 10*3/uL (ref 0.1–0.9)
Monocytes: 9 %
NEUTROS PCT: 50 %
Neutrophils Absolute: 3.2 10*3/uL (ref 1.4–7.0)
Platelets: 267 10*3/uL (ref 150–379)
RBC: 4.84 x10E6/uL (ref 3.77–5.28)
RDW: 14.9 % (ref 12.3–15.4)
WBC: 6.3 10*3/uL (ref 3.4–10.8)

## 2017-02-18 LAB — HEPATITIS C ANTIBODY: Hep C Virus Ab: <0.1 s/co ratio (ref 0.0–0.9)

## 2017-02-18 LAB — HEPATIC FUNCTION PANEL
ALBUMIN: 4.6 g/dL (ref 3.5–5.5)
ALK PHOS: 80 IU/L (ref 39–117)
ALT: 18 IU/L (ref 0–32)
AST: 21 IU/L (ref 0–40)
BILIRUBIN TOTAL: 0.2 mg/dL (ref 0.0–1.2)
BILIRUBIN, DIRECT: 0.07 mg/dL (ref 0.00–0.40)
TOTAL PROTEIN: 8.9 g/dL — AB (ref 6.0–8.5)

## 2017-02-18 LAB — LIPID PANEL
CHOLESTEROL TOTAL: 330 mg/dL — AB (ref 100–199)
Chol/HDL Ratio: 9.7 ratio — ABNORMAL HIGH (ref 0.0–4.4)
HDL: 34 mg/dL — ABNORMAL LOW (ref >39)
Triglycerides: 444 mg/dL — ABNORMAL HIGH (ref 0–149)

## 2017-02-18 LAB — TSH: TSH: 7.83 u[IU]/mL — AB (ref 0.450–4.500)

## 2017-02-18 LAB — MICROALBUMIN / CREATININE URINE RATIO
Creatinine, Urine: 77.6 mg/dL
MICROALB/CREAT RATIO: 9.4 mg/g{creat} (ref 0.0–30.0)
Microalbumin, Urine: 7.3 ug/mL

## 2017-02-24 ENCOUNTER — Other Ambulatory Visit: Payer: Self-pay | Admitting: Family Medicine

## 2017-02-24 ENCOUNTER — Telehealth: Payer: Self-pay

## 2017-02-24 DIAGNOSIS — E782 Mixed hyperlipidemia: Secondary | ICD-10-CM

## 2017-02-24 DIAGNOSIS — E039 Hypothyroidism, unspecified: Secondary | ICD-10-CM

## 2017-02-24 MED ORDER — ATORVASTATIN CALCIUM 20 MG PO TABS: 20.0000 mg | ORAL_TABLET | Freq: Every day | ORAL | 0 refills | Status: DC

## 2017-02-24 MED ORDER — LEVOTHYROXINE SODIUM 50 MCG PO TABS: 50.0000 ug | ORAL_TABLET | Freq: Every day | ORAL | 0 refills | Status: DC

## 2017-02-24 NOTE — Telephone Encounter (Signed)
-----   Message from Alfonse Spruce, [place] sent at 02/24/2017  8:33 AM EDT ----- -Hepatitis C is negative.  -TSH indicates you have hypothyroidism. You have been prescribed levothyroxine. Recommend scheduling follow up appointment in 6 to 8 weeks. -Lipid levels were elevated. This can increase your risk of heart disease. You will be prescribed atorvastatin to help lower this risk.Recommend scheduling follow up in 3 months.  -CBC shows no indication of anemia or infection present. Liver function is normal. -Microalbumin/creatinine ratio level was normal. This tests for protein in your urine that can indicate early signs of kidney damage.

## 2017-02-24 NOTE — Telephone Encounter (Signed)
CMA call patient regarding lab results  Patient did not answer but left a VM stating the reason of the call & to call back

## 2017-02-27 ENCOUNTER — Ambulatory Visit: Payer: Medicaid Other | Attending: Family Medicine | Admitting: *Deleted

## 2017-02-27 ENCOUNTER — Telehealth: Payer: Self-pay

## 2017-02-27 VITALS — BP 120/70 | HR 92 | Resp 16

## 2017-02-27 DIAGNOSIS — I10 Essential (primary) hypertension: Secondary | ICD-10-CM

## 2017-02-27 NOTE — Telephone Encounter (Signed)
Patient call CMA regarding medication management   CMA advice her to discontinue Crestor & start atorvastatin   Patient was aware and understood

## 2017-02-27 NOTE — Progress Notes (Signed)
Pt arrived to [facility] for nurse visit: BP check.  Pt alert and oriented and arrives in good spirits. Last OV 02/10/2017 with Gean Quint, FNP.  Pt denies chest pain, SOB, HA, dizziness, or blurred vision.  Verified medication. Pt states medication  for BP was taken this morning. She was not aware of thyroid medication. Pt informed she needed to Levothyroxine. Instructions how to take were provided.   Manual blood pressure reading: 120/70 Pt aware to make f/u appointment 6 weeks.

## 2017-04-02 ENCOUNTER — Encounter: Payer: Self-pay | Admitting: Family Medicine

## 2017-04-03 ENCOUNTER — Encounter: Payer: Self-pay | Admitting: Family Medicine

## 2017-04-04 ENCOUNTER — Ambulatory Visit: Payer: Medicaid Other | Attending: Family Medicine | Admitting: Family Medicine

## 2017-04-04 VITALS — BP 135/85 | HR 69 | Temp 97.9°F | Resp 18 | Ht 67.0 in | Wt 202.0 lb

## 2017-04-04 DIAGNOSIS — Z7982 Long term (current) use of aspirin: Secondary | ICD-10-CM | POA: Diagnosis not present

## 2017-04-04 DIAGNOSIS — Z79899 Other long term (current) drug therapy: Secondary | ICD-10-CM | POA: Diagnosis not present

## 2017-04-04 DIAGNOSIS — G8929 Other chronic pain: Secondary | ICD-10-CM | POA: Diagnosis present

## 2017-04-04 DIAGNOSIS — M545 Low back pain: Secondary | ICD-10-CM | POA: Diagnosis not present

## 2017-04-04 DIAGNOSIS — E039 Hypothyroidism, unspecified: Secondary | ICD-10-CM | POA: Diagnosis not present

## 2017-04-04 DIAGNOSIS — M5440 Lumbago with sciatica, unspecified side: Secondary | ICD-10-CM | POA: Diagnosis not present

## 2017-04-04 MED ORDER — IBUPROFEN 600 MG PO TABS: 600.0000 mg | ORAL_TABLET | Freq: Three times a day (TID) | ORAL | 1 refills | Status: DC | PRN

## 2017-04-04 MED ORDER — ACETAMINOPHEN-CODEINE #3 300-30 MG PO TABS: 1.0000 | ORAL_TABLET | Freq: Three times a day (TID) | ORAL | 0 refills | Status: DC | PRN

## 2017-04-04 MED ORDER — METHOCARBAMOL 500 MG PO TABS: 500.0000 mg | ORAL_TABLET | Freq: Three times a day (TID) | ORAL | 0 refills | Status: DC | PRN

## 2017-04-04 NOTE — Progress Notes (Signed)
Patient is here for back pain.

## 2017-04-04 NOTE — Progress Notes (Signed)
Subjective:  Patient ID: [name], female    DOB: 04/06/1961  Age: 56 y.o. MRN: [id number]  CC: Back Pain   HPI MINOLA GUIN presents for chronic low back pain. Symptoms 3 years ago. The pain is rated 7 /10, and is located at the across the lower back. The pain is described as aching and occurs intermittently. The symptoms has been progressive and has worsened within the last several weeks. Symptoms are exacerbated by standing and walking. She denies any bowel or bladder incontinence.Factors which relieve the pain include nothing. Other associated symptoms include parethesias of the lower legs and back spasm.  Se reports taking NSAIDs with minimal relief of symptoms.  History of hypothyroidism. Symptoms consist of denies fatigue, weight changes, heat/cold intolerance, bowel/skin changes or CVS symptoms.  Previous thyroid studies include TSH.     Outpatient Medications Prior to Visit  Medication Sig Dispense Refill  . amitriptyline (ELAVIL) 25 MG tablet Take 1 tablet (25 mg total) by mouth at bedtime. 60 tablet 1  . aspirin EC 81 MG tablet Take 1 tablet (81 mg total) by mouth daily. 90 tablet 3  . atorvastatin (LIPITOR) 20 MG tablet Take 1 tablet (20 mg total) by mouth daily. 90 tablet 0  . Cholecalciferol (VITAMIN D) 2000 units tablet Take 2,000 Units by mouth daily.    . divalproex (DEPAKOTE) 500 MG DR tablet Take one tablet in the morning and 2 at night 90 tablet 6  . escitalopram (LEXAPRO) 10 MG tablet Take 10 mg by mouth at bedtime.  2  . indomethacin (INDOCIN) 25 MG capsule Take 25 mg by mouth 3 (three) times daily as needed (headache).    . Omega-3 Fatty Acids (FISH OIL) 1000 MG CAPS Take 1,000 mg by mouth 2 (two) times daily.    . propranolol (INDERAL) 80 MG tablet Take 1 tablet (80 mg total) by mouth 2 (two) times daily. 60 tablet 2  . ibuprofen (ADVIL,MOTRIN) 600 MG tablet Take 1 tablet (600 mg total) by mouth every 8 (eight) hours as needed (Take with food). 40 tablet 0    . levothyroxine (SYNTHROID, LEVOTHROID) 50 MCG tablet Take 1 tablet (50 mcg total) by mouth daily. (Patient not taking: Reported on 02/27/2017) 60 tablet 0  . pravastatin (PRAVACHOL) 40 MG tablet Take 1 tablet (40 mg total) by mouth daily. 90 tablet 3  . rosuvastatin (CRESTOR) 20 MG tablet Take 1 tablet (20 mg total) by mouth daily. (Patient not taking: Reported on 04/04/2017) 90 tablet 0   No facility-administered medications prior to visit.     ROS Review of Systems  Constitutional: Negative.   HENT: Negative.   Eyes: Negative.   Respiratory: Negative.   Cardiovascular: Negative.   Musculoskeletal: Positive for back pain.  Skin: Negative.   Neurological:       Leg parathesias    Objective:  BP 135/85 (BP Location: Left Arm, Patient Position: Sitting, Cuff Size: Normal)   Pulse 69   Temp 97.9 F (36.6 C) (Oral)   Resp 18   Ht 5\' 7"  (1.702 m)   Wt 202 lb (91.6 kg)   LMP 06/06/2015   SpO2 97%   BMI 31.64 kg/m   BP/Weight 04/04/2017 02/27/2017 04/22/5008  Systolic BP 381 829 937  Diastolic BP 85 70 89  Wt. (Lbs) 202 - 198.2  BMI 31.64 - 31.04     Physical Exam  Constitutional: She is oriented to person, place, and time. She appears well-developed and well-nourished.  HENT:  Head: Normocephalic and atraumatic.  Right Ear: External ear normal.  Left Ear: External ear normal.  Nose: Nose normal.  Mouth/Throat: Oropharynx is clear and moist.  Eyes: Conjunctivae are normal. Pupils are equal, round, and reactive to light.  Neck: Normal range of motion. Neck supple. No thyromegaly present.  Cardiovascular: Normal rate, regular rhythm, normal heart sounds and intact distal pulses.   Pulmonary/Chest: Effort normal and breath sounds normal.  Abdominal: Soft. Bowel sounds are normal.  Musculoskeletal:       Lumbar back: She exhibits pain and spasm.  Neurological: She is alert and oriented to person, place, and time. She has normal reflexes.  Skin: Skin is warm and dry.   Nursing note and vitals reviewed.    Assessment & Plan:   Problem List Items Addressed This Visit      Endocrine   Hypothyroidism   Relevant Orders   TSH (Completed)     Nervous and Auditory   Back pain of lumbar region with sciatica - Primary   Relevant Medications   ibuprofen (ADVIL,MOTRIN) 600 MG tablet   acetaminophen-codeine (TYLENOL #3) 300-30 MG tablet   methocarbamol (ROBAXIN) 500 MG tablet   Other Relevant Orders   DG Lumbar Spine Complete (Completed)      Meds ordered this encounter  Medications  . ibuprofen (ADVIL,MOTRIN) 600 MG tablet    Sig: Take 1 tablet (600 mg total) by mouth every 8 (eight) hours as needed (Take with food).    Dispense:  30 tablet    Refill:  1    Order Specific Question:   Supervising Provider    Answer:   Tresa Garter [id number]  . acetaminophen-codeine (TYLENOL #3) 300-30 MG tablet    Sig: Take 1 tablet by mouth every 8 (eight) hours as needed for severe pain.    Dispense:  40 tablet    Refill:  0    Order Specific Question:   Supervising Provider    Answer:   Tresa Garter [id number]  . methocarbamol (ROBAXIN) 500 MG tablet    Sig: Take 1 tablet (500 mg total) by mouth every 8 (eight) hours as needed for muscle spasms.    Dispense:  30 tablet    Refill:  0    Order Specific Question:   Supervising Provider    Answer:   Tresa Garter [id number]    Follow-up: Return in about 8 weeks (around 05/30/2017), or if symptoms worsen or fail to improve, for HTN/HLD/Hypothyroidism.   Alfonse Spruce FNP

## 2017-04-04 NOTE — Patient Instructions (Signed)

## 2017-04-05 LAB — TSH: TSH: 1.28 u[IU]/mL (ref 0.450–4.500)

## 2017-04-07 ENCOUNTER — Ambulatory Visit (HOSPITAL_COMMUNITY)
Admission: RE | Admit: 2017-04-07 | Discharge: 2017-04-07 | Disposition: A | Discharge status: Home / Self Care | Payer: Medicaid Other | Source: Ambulatory Visit | Attending: Family Medicine | Admitting: Family Medicine

## 2017-04-07 DIAGNOSIS — M5440 Lumbago with sciatica, unspecified side: Secondary | ICD-10-CM | POA: Insufficient documentation

## 2017-04-09 ENCOUNTER — Other Ambulatory Visit: Payer: Self-pay | Admitting: Family Medicine

## 2017-04-09 DIAGNOSIS — E039 Hypothyroidism, unspecified: Secondary | ICD-10-CM

## 2017-04-09 MED ORDER — LEVOTHYROXINE SODIUM 50 MCG PO TABS: 50.0000 ug | ORAL_TABLET | Freq: Every day | ORAL | 0 refills | Status: DC

## 2017-04-10 ENCOUNTER — Telehealth: Payer: Self-pay

## 2017-04-10 NOTE — Telephone Encounter (Signed)
-----   Message from Alfonse Spruce, [place] sent at 04/09/2017  6:08 PM EDT ----- Thyroid levels within normal range on current dosage of levothyroxine.  Recommend follow up in 3 months.

## 2017-04-10 NOTE — Telephone Encounter (Signed)
CMA call regarding lab results   Patient Verify DOB   Patient was aware and understood  

## 2017-04-11 ENCOUNTER — Encounter: Payer: Self-pay | Admitting: Family Medicine

## 2017-04-13 ENCOUNTER — Encounter: Payer: Self-pay | Admitting: Family Medicine

## 2017-04-14 ENCOUNTER — Encounter: Payer: Self-pay | Admitting: Family Medicine

## 2017-04-14 ENCOUNTER — Other Ambulatory Visit: Payer: Self-pay | Admitting: Family Medicine

## 2017-04-14 DIAGNOSIS — M5440 Lumbago with sciatica, unspecified side: Secondary | ICD-10-CM

## 2017-04-14 NOTE — Telephone Encounter (Signed)
Patient concern

## 2017-04-16 ENCOUNTER — Telehealth: Payer: Self-pay

## 2017-04-16 NOTE — Telephone Encounter (Signed)
CMA call regarding x ray results   Patient Verify DOB   Patient was aware and understood  

## 2017-04-16 NOTE — Telephone Encounter (Signed)
-----   Message from Alfonse Spruce, [place] sent at 04/14/2017  6:16 PM EDT ----- X-ray of back showed no narrowing of the spinal column, no abnormality of the joints or spine.  You will be referred to orthopedics for further evaluation.

## 2017-04-18 ENCOUNTER — Encounter: Payer: Self-pay | Admitting: Family Medicine

## 2017-05-01 ENCOUNTER — Ambulatory Visit (INDEPENDENT_AMBULATORY_CARE_PROVIDER_SITE_OTHER): Payer: Medicaid Other | Admitting: Physical Medicine and Rehabilitation

## 2017-05-01 ENCOUNTER — Encounter: Payer: Self-pay | Admitting: Family Medicine

## 2017-05-01 ENCOUNTER — Encounter (INDEPENDENT_AMBULATORY_CARE_PROVIDER_SITE_OTHER): Payer: Self-pay | Admitting: Physical Medicine and Rehabilitation

## 2017-05-01 VITALS — BP 149/98 | HR 76

## 2017-05-01 DIAGNOSIS — F419 Anxiety disorder, unspecified: Secondary | ICD-10-CM

## 2017-05-01 DIAGNOSIS — M5416 Radiculopathy, lumbar region: Secondary | ICD-10-CM

## 2017-05-01 DIAGNOSIS — F329 Major depressive disorder, single episode, unspecified: Secondary | ICD-10-CM

## 2017-05-01 DIAGNOSIS — F32A Depression, unspecified: Secondary | ICD-10-CM

## 2017-05-01 DIAGNOSIS — M5442 Lumbago with sciatica, left side: Secondary | ICD-10-CM | POA: Diagnosis not present

## 2017-05-01 DIAGNOSIS — G8929 Other chronic pain: Secondary | ICD-10-CM

## 2017-05-01 MED ORDER — BACLOFEN 10 MG PO TABS: 10.0000 mg | ORAL_TABLET | Freq: Three times a day (TID) | ORAL | 0 refills | Status: DC | PRN

## 2017-05-01 MED ORDER — MELOXICAM 15 MG PO TABS: 15.0000 mg | ORAL_TABLET | Freq: Every day | ORAL | 0 refills | Status: DC

## 2017-05-01 NOTE — Progress Notes (Signed)
[name] - 56 y.o. female MRN [id number]  Date of birth: 1961-02-13  Office Visit Note: Visit Date: 05/01/2017 PCP: Alfonse Spruce, FNP Referred by: Orlando Penner*  Subjective: Chief Complaint  Patient presents with  . Lower Back - Pain  . Left Lower Leg - Pain   HPI: [name] is a 56 year old female who comes in today at the request of Fredia Beets, FNP for evaluation and management of chronic worsening severe at times low back pain with left radicular pain. The patient reports to me today that she's had 4-5 months of worsening back pain really on the left lower back with referral into the leg and foot in a mostly L5 distribution which is posterior laterally and into the anterior lateral calf and sometimes top of the foot. She gets some pain in the back of the calf as well. She denies any numbness tingling or paresthesias. She doesn't really have any symptoms on the right leg but does have symptoms across the back. She does not report any specific injury 4-5 months ago. She's had a chronic issue off and on back pain. She does endorse a recent fall where she did bruise the left side of her lower back and reports some bruising superficially. She does not report any really increasing pain after the fall. She has had some therapy in the past but not recent physical therapy. She has had time and medication management through her nurse practitioner. Her case is complicated by concomitant depression and anxiety. The patient is on Lexapro as well as Depakote. She takes amitriptyline at night and was given a muscle relaxer of methocarbamol. She does take ibuprofen at times. She was recently given a prescription for Tylenol 3 but has not started it yet as it seems to really wipe her out when she takes it. She's had no prior spine surgeries. She has had x-rays of the lumbar spine completed fairly recently and those are reviewed below. The x-rays were really unrevealing in terms of  acute findings. I did review the images with her. She has not had an MRI or CAT scan of her lumbar spine. She rates her pain as severe and really does limit what she can do. Is worse with standing and trying to walk for any length of time. She denies any fevers or chills or night sweats. She's had no specific night pain. She's had no unintended weight loss.    Review of Systems  Constitutional: Positive for malaise/fatigue. Negative for chills, fever and weight loss.  HENT: Negative for hearing loss and sinus pain.   Eyes: Negative for blurred vision, double vision and photophobia.  Respiratory: Negative for cough and shortness of breath.   Cardiovascular: Negative for chest pain, palpitations and leg swelling.  Gastrointestinal: Negative for abdominal pain, nausea and vomiting.  Genitourinary: Negative for flank pain.  Musculoskeletal: Positive for back pain. Negative for myalgias.       Left hip and leg pain  Skin: Negative for itching and rash.  Neurological: Negative for tingling, tremors, focal weakness and weakness.  Endo/Heme/Allergies: Negative.   Psychiatric/Behavioral: Negative for depression.  All other systems reviewed and are negative.  Otherwise per HPI. ty #3   Assessment & Plan: Visit Diagnoses:  1. Lumbar radiculopathy   2. Chronic bilateral low back pain with left-sided sciatica   3. Depression, unspecified depression type   4. Anxiety     Plan: Findings:  Chronic intermittent history of low back pain but now  with 4-5 months of worsening left more than right low back pain with referral on the left hip and leg consistent intensely with a radicular pain. She does have a positive slump test on the left but good strength and no neurologic deficits. No red flag symptoms. I think at this point however given the fact that just been a chronic ongoing problem with off and on symptoms and now severe pain with standing and walking, and with x-ray imaging showing some facet  arthropathy at L4-5 an MRI of the lumbar spine would be warranted to look at anything causing a stenosis at L4-5 particularly on lateral recess. This could be a focal disc protrusion although her symptoms appear to be more like stenosis with worsening with walking and standing and neurogenic claudication. I do want to give her meloxicam to be taken once a day just for the short term every day and be for the next couple weeks or so. I do want to switch her muscle relaxer to baclofen to see if that gives her any relief at all and we discussed the potential side effects of this. She continues to Tylenol 3 judiciously since it does seem to wipe her out with that probably would provide a little bit more pain relief. Obviously chronic opioid medications are not really warranted in this case. Depending on what the MRI looks like we could potentially do epidural steroid injection or regrouping with specific physical therapy. I spent more than 30 minutes speaking face-to-face with the patient with 50% of the time in counseling.    Meds & Orders:  Meds ordered this encounter  Medications  . meloxicam (MOBIC) 15 MG tablet    Sig: Take 1 tablet (15 mg total) by mouth daily. Take with food    Dispense:  30 tablet    Refill:  0  . baclofen (LIORESAL) 10 MG tablet    Sig: Take 1 tablet (10 mg total) by mouth every 8 (eight) hours as needed for muscle spasms (Pain).    Dispense:  60 tablet    Refill:  0    Orders Placed This Encounter  Procedures  . MR LUMBAR SPINE WO CONTRAST    Follow-up: No Follow-up on file.   Procedures: No procedures performed  No notes on file   Clinical History: LUMBAR SPINE - COMPLETE 4+ VIEW  COMPARISON:  08/02/2016  FINDINGS: There are 5 nonrib bearing lumbar-type vertebral bodies.  The vertebral body heights are maintained.  The alignment is anatomic. There is no static listhesis. There is no spondylolysis.  There is no acute fracture.  The disc spaces are  maintained.  The SI joints are unremarkable.  IMPRESSION: No focal abnormality of the lumbar spine.   Electronically Signed   By: Kathreen Devoid   On: 04/07/2017 09:41  She reports that she has never smoked. She has never used smokeless tobacco. No results for input(s): HGBA1C, LABURIC in the last 8760 hours.  Objective:  VS:  HT:    WT:   BMI:     BP:(!) 149/98  HR:76bpm  TEMP: ( )  RESP:  Physical Exam  Constitutional: She is oriented to person, place, and time. She appears well-developed and well-nourished. No distress.  HENT:  Head: Normocephalic and atraumatic.  Nose: Nose normal.  Mouth/Throat: Oropharynx is clear and moist.  Eyes: Pupils are equal, round, and reactive to light. Conjunctivae are normal.  Neck: Normal range of motion. Neck supple.  Cardiovascular: Regular rhythm and intact distal pulses.  Pulmonary/Chest: Effort normal. No respiratory distress.  Abdominal: She exhibits no distension. There is no guarding.  Musculoskeletal:  The patient ambulates without aid with a normal gait.  They have no pain with hip internal or external rotation.  There is pain with extension rotation of the lumbar spine. They have mild pain over the greater trochanters and  PSIS as well as rocking over the vertebral bodies of the lumbosacral junction.  On manual muscle testing there is 5/5 strength in all muscle groups of the lower extremities bilaterally without deficits.  There are 2+ muscle stretch reflexes at the quadriceps and gastrocnemius.  There is no clonus bilaterally.  Slump test is positive on the left.   Neurological: She is alert and oriented to person, place, and time. She exhibits normal muscle tone. Coordination normal.  Skin: Skin is warm. No rash noted. No erythema.  Psychiatric: She has a normal mood and affect. Her behavior is normal.  Nursing note and vitals reviewed.   Ortho Exam Imaging: No results found.  Past Medical/Family/Surgical/Social  History: Medications & Allergies reviewed per EMR Patient Active Problem List   Diagnosis Date Noted  . Hypothyroidism 02/24/2017  . Mixed hyperlipidemia 02/24/2017  . 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 Sleep related headaches 01/23/2017  . Snoring 01/23/2017  . 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 Chronic headaches 10/30/2016  . Obesity 05/24/2016  . PMB (postmenopausal bleeding) 05/24/2016  . Back pain of lumbar region with sciatica 09/28/2015   Past Medical History:  Diagnosis Date  . Anxiety   . Depression   . Hypertension   . Migraines    Family History  Problem Relation Age of Onset  . Headache Neg Hx    Past Surgical History:  Procedure Laterality Date  . NO PAST SURGERIES     Social History   Occupational History  . Unemployed    Social History Main Topics  . Smoking status: Never Smoker  . Smokeless tobacco: Never Used  . Alcohol use No  . Drug use: No  . Sexual activity: Not Currently    Partners: Male

## 2017-05-01 NOTE — Progress Notes (Deleted)
Left sided lower back pain. Pain radiates down leg when walking or standing for long periods of time. Pain goes down back of calf. No numbness or tingling. Pain has been getting worse lately, has had pain for about 4-5 months. Also had a recent fall and bruised left side of lower back.

## 2017-05-03 ENCOUNTER — Encounter (INDEPENDENT_AMBULATORY_CARE_PROVIDER_SITE_OTHER): Payer: Self-pay | Admitting: Physical Medicine and Rehabilitation

## 2017-05-05 ENCOUNTER — Observation Stay (HOSPITAL_COMMUNITY)
Admission: EM | Admit: 2017-05-05 | Discharge: 2017-05-06 | Disposition: A | Discharge status: Home / Self Care | Payer: Medicaid Other | Attending: Internal Medicine | Admitting: Internal Medicine

## 2017-05-05 ENCOUNTER — Encounter (HOSPITAL_COMMUNITY): Payer: Self-pay | Admitting: *Deleted

## 2017-05-05 ENCOUNTER — Observation Stay (HOSPITAL_COMMUNITY): Payer: Medicaid Other

## 2017-05-05 ENCOUNTER — Emergency Department (HOSPITAL_COMMUNITY): Payer: Medicaid Other

## 2017-05-05 DIAGNOSIS — R7989 Other specified abnormal findings of blood chemistry: Secondary | ICD-10-CM | POA: Diagnosis not present

## 2017-05-05 DIAGNOSIS — Z79899 Other long term (current) drug therapy: Secondary | ICD-10-CM | POA: Insufficient documentation

## 2017-05-05 DIAGNOSIS — R51 Headache: Secondary | ICD-10-CM | POA: Insufficient documentation

## 2017-05-05 DIAGNOSIS — R740 Nonspecific elevation of levels of transaminase and lactic acid dehydrogenase [LDH]: Secondary | ICD-10-CM | POA: Diagnosis not present

## 2017-05-05 DIAGNOSIS — Z9181 History of falling: Secondary | ICD-10-CM | POA: Diagnosis not present

## 2017-05-05 DIAGNOSIS — I700 Atherosclerosis of aorta: Secondary | ICD-10-CM | POA: Diagnosis not present

## 2017-05-05 DIAGNOSIS — Z7982 Long term (current) use of aspirin: Secondary | ICD-10-CM | POA: Insufficient documentation

## 2017-05-05 DIAGNOSIS — R109 Unspecified abdominal pain: Secondary | ICD-10-CM | POA: Diagnosis not present

## 2017-05-05 DIAGNOSIS — M545 Low back pain: Secondary | ICD-10-CM | POA: Diagnosis present

## 2017-05-05 DIAGNOSIS — E039 Hypothyroidism, unspecified: Secondary | ICD-10-CM | POA: Insufficient documentation

## 2017-05-05 DIAGNOSIS — R531 Weakness: Secondary | ICD-10-CM | POA: Insufficient documentation

## 2017-05-05 DIAGNOSIS — I10 Essential (primary) hypertension: Secondary | ICD-10-CM | POA: Diagnosis present

## 2017-05-05 DIAGNOSIS — E782 Mixed hyperlipidemia: Secondary | ICD-10-CM | POA: Diagnosis not present

## 2017-05-05 DIAGNOSIS — K760 Fatty (change of) liver, not elsewhere classified: Secondary | ICD-10-CM | POA: Diagnosis not present

## 2017-05-05 DIAGNOSIS — M5440 Lumbago with sciatica, unspecified side: Secondary | ICD-10-CM | POA: Diagnosis not present

## 2017-05-05 DIAGNOSIS — D259 Leiomyoma of uterus, unspecified: Secondary | ICD-10-CM | POA: Insufficient documentation

## 2017-05-05 DIAGNOSIS — G8929 Other chronic pain: Secondary | ICD-10-CM

## 2017-05-05 DIAGNOSIS — Z880 Allergy status to penicillin: Secondary | ICD-10-CM | POA: Insufficient documentation

## 2017-05-05 DIAGNOSIS — I160 Hypertensive urgency: Secondary | ICD-10-CM

## 2017-05-05 LAB — BASIC METABOLIC PANEL
ANION GAP: 8 (ref 5–15)
BUN: 11 mg/dL (ref 6–20)
CHLORIDE: 106 mmol/L (ref 101–111)
CO2: 23 mmol/L (ref 22–32)
Calcium: 9.4 mg/dL (ref 8.9–10.3)
Creatinine, Ser: 0.94 mg/dL (ref 0.44–1.00)
GFR calc non Af Amer: >60 mL/min (ref >60)
Glucose, Bld: 126 mg/dL — ABNORMAL HIGH (ref 65–99)
Potassium: 3.8 mmol/L (ref 3.5–5.1)
SODIUM: 137 mmol/L (ref 135–145)

## 2017-05-05 LAB — CBC WITH DIFFERENTIAL/PLATELET
BASOS ABS: 0 10*3/uL (ref 0.0–0.1)
BASOS PCT: 0 %
EOS ABS: 0.1 10*3/uL (ref 0.0–0.7)
Eosinophils Relative: 1 %
HEMATOCRIT: 42 % (ref 36.0–46.0)
HEMOGLOBIN: 14.6 g/dL (ref 12.0–15.0)
Lymphocytes Relative: 32 %
Lymphs Abs: 1.8 10*3/uL (ref 0.7–4.0)
MCH: 31.5 pg (ref 26.0–34.0)
MCHC: 34.8 g/dL (ref 30.0–36.0)
MCV: 90.5 fL (ref 78.0–100.0)
Monocytes Absolute: 0.7 10*3/uL (ref 0.1–1.0)
Monocytes Relative: 13 %
NEUTROS ABS: 3.1 10*3/uL (ref 1.7–7.7)
NEUTROS PCT: 54 %
Platelets: 229 10*3/uL (ref 150–400)
RBC: 4.64 MIL/uL (ref 3.87–5.11)
RDW: 13.4 % (ref 11.5–15.5)
WBC: 5.7 10*3/uL (ref 4.0–10.5)

## 2017-05-05 LAB — URINALYSIS, ROUTINE W REFLEX MICROSCOPIC
BILIRUBIN URINE: NEGATIVE
GLUCOSE, UA: NEGATIVE mg/dL
Ketones, ur: 5 mg/dL — AB
NITRITE: NEGATIVE
PROTEIN: 30 mg/dL — AB
SPECIFIC GRAVITY, URINE: 1.027 (ref 1.005–1.030)
pH: 5 (ref 5.0–8.0)

## 2017-05-05 LAB — PROCALCITONIN

## 2017-05-05 LAB — I-STAT CG4 LACTIC ACID, ED
LACTIC ACID, VENOUS: 2.1 mmol/L — AB (ref 0.5–1.9)
LACTIC ACID, VENOUS: 2.5 mmol/L — AB (ref 0.5–1.9)
Lactic Acid, Venous: 2.83 mmol/L (ref 0.5–1.9)

## 2017-05-05 LAB — I-STAT TROPONIN, ED
Troponin i, poc: 0 ng/mL (ref 0.00–0.08)
Troponin i, poc: 0 ng/mL (ref 0.00–0.08)

## 2017-05-05 LAB — VALPROIC ACID LEVEL: VALPROIC ACID LVL: 17 ug/mL — AB (ref 50.0–100.0)

## 2017-05-05 LAB — I-STAT BETA HCG BLOOD, ED (MC, WL, AP ONLY)

## 2017-05-05 LAB — BRAIN NATRIURETIC PEPTIDE: B NATRIURETIC PEPTIDE 5: 24.6 pg/mL (ref 0.0–100.0)

## 2017-05-05 LAB — T4, FREE: Free T4: 0.83 ng/dL (ref 0.61–1.12)

## 2017-05-05 LAB — TSH: TSH: 1.844 u[IU]/mL (ref 0.350–4.500)

## 2017-05-05 MED ORDER — DEXTROSE 5 % IV SOLN: 1.0000 g | Freq: Once | INTRAVENOUS | Status: DC

## 2017-05-05 MED ORDER — LABETALOL HCL 5 MG/ML IV SOLN: 10.0000 mg | Freq: Once | INTRAVENOUS | Status: DC

## 2017-05-05 MED ORDER — MORPHINE SULFATE (PF) 4 MG/ML IV SOLN
4.0000 mg | Freq: Once | INTRAVENOUS | Status: AC
Start: 2017-05-05 — End: 2017-05-05
  Administered 2017-05-05: 4 mg via INTRAVENOUS
  Filled 2017-05-05: qty 1

## 2017-05-05 MED ORDER — ACETAMINOPHEN 650 MG RE SUPP: 650.0000 mg | Freq: Four times a day (QID) | RECTAL | Status: DC | PRN

## 2017-05-05 MED ORDER — LEVOTHYROXINE SODIUM 50 MCG PO TABS
50.0000 ug | ORAL_TABLET | Freq: Every day | ORAL | Status: DC
  Administered 2017-05-06: 50 ug via ORAL
  Filled 2017-05-05 (×2): qty 1

## 2017-05-05 MED ORDER — DEXTROSE 5 % IV SOLN
2.0000 g | Freq: Once | INTRAVENOUS | Status: AC
  Administered 2017-05-05: 2 g via INTRAVENOUS
  Filled 2017-05-05: qty 2

## 2017-05-05 MED ORDER — IOPAMIDOL (ISOVUE-300) INJECTION 61%
INTRAVENOUS | Status: AC
  Administered 2017-05-05: 100 mL
  Filled 2017-05-05: qty 100

## 2017-05-05 MED ORDER — TRAMADOL HCL 50 MG PO TABS: 100.0000 mg | ORAL_TABLET | Freq: Once | ORAL | Status: DC

## 2017-05-05 MED ORDER — PROPRANOLOL HCL 40 MG PO TABS
40.0000 mg | ORAL_TABLET | Freq: Once | ORAL | Status: AC
  Administered 2017-05-05: 40 mg via ORAL
  Filled 2017-05-05: qty 1

## 2017-05-05 MED ORDER — SODIUM CHLORIDE 0.9 % IV BOLUS (SEPSIS)
1000.0000 mL | Freq: Once | INTRAVENOUS | Status: AC
  Administered 2017-05-05: 1000 mL via INTRAVENOUS

## 2017-05-05 MED ORDER — ONDANSETRON HCL 4 MG/2ML IJ SOLN
4.0000 mg | Freq: Once | INTRAMUSCULAR | Status: AC
  Administered 2017-05-05: 4 mg via INTRAVENOUS
  Filled 2017-05-05: qty 2

## 2017-05-05 MED ORDER — DIVALPROEX SODIUM 250 MG PO DR TAB
500.0000 mg | DELAYED_RELEASE_TABLET | Freq: Every day | ORAL | Status: DC
  Administered 2017-05-06: 500 mg via ORAL
  Filled 2017-05-05: qty 2

## 2017-05-05 MED ORDER — MORPHINE SULFATE (PF) 4 MG/ML IV SOLN
4.0000 mg | Freq: Once | INTRAVENOUS | Status: AC
  Administered 2017-05-05: 4 mg via INTRAVENOUS
  Filled 2017-05-05: qty 1

## 2017-05-05 MED ORDER — AMLODIPINE BESYLATE 5 MG PO TABS
5.0000 mg | ORAL_TABLET | Freq: Every day | ORAL | Status: DC
  Administered 2017-05-05 – 2017-05-06 (×2): 5 mg via ORAL
  Filled 2017-05-05 (×2): qty 1

## 2017-05-05 MED ORDER — BACLOFEN 10 MG PO TABS
10.0000 mg | ORAL_TABLET | Freq: Three times a day (TID) | ORAL | Status: DC | PRN
  Filled 2017-05-05: qty 1

## 2017-05-05 MED ORDER — FISH OIL 1000 MG PO CAPS: 1000.0000 mg | ORAL_CAPSULE | Freq: Two times a day (BID) | ORAL | Status: DC

## 2017-05-05 MED ORDER — PROPRANOLOL HCL 80 MG PO TABS
80.0000 mg | ORAL_TABLET | Freq: Two times a day (BID) | ORAL | Status: DC
  Administered 2017-05-05 – 2017-05-06 (×2): 80 mg via ORAL
  Filled 2017-05-05 (×2): qty 1

## 2017-05-05 MED ORDER — LABETALOL HCL 5 MG/ML IV SOLN
10.0000 mg | Freq: Once | INTRAVENOUS | Status: AC
  Administered 2017-05-05: 10 mg via INTRAVENOUS
  Filled 2017-05-05: qty 4

## 2017-05-05 MED ORDER — ACETAMINOPHEN-CODEINE #3 300-30 MG PO TABS
1.0000 | ORAL_TABLET | Freq: Three times a day (TID) | ORAL | Status: DC | PRN
  Administered 2017-05-06 (×2): 1 via ORAL
  Filled 2017-05-05 (×2): qty 1

## 2017-05-05 MED ORDER — VITAMIN D 1000 UNITS PO TABS
2000.0000 [IU] | ORAL_TABLET | Freq: Every day | ORAL | Status: DC
  Administered 2017-05-06: 2000 [IU] via ORAL
  Filled 2017-05-05: qty 2

## 2017-05-05 MED ORDER — AMITRIPTYLINE HCL 25 MG PO TABS
25.0000 mg | ORAL_TABLET | Freq: Every day | ORAL | Status: DC
  Administered 2017-05-05: 25 mg via ORAL
  Filled 2017-05-05: qty 1

## 2017-05-05 MED ORDER — IBUPROFEN 600 MG PO TABS: 600.0000 mg | ORAL_TABLET | Freq: Three times a day (TID) | ORAL | Status: DC | PRN

## 2017-05-05 MED ORDER — LABETALOL HCL 5 MG/ML IV SOLN
20.0000 mg | Freq: Once | INTRAVENOUS | Status: AC
  Administered 2017-05-05: 20 mg via INTRAVENOUS
  Filled 2017-05-05: qty 4

## 2017-05-05 MED ORDER — DIVALPROEX SODIUM 250 MG PO DR TAB
1000.0000 mg | DELAYED_RELEASE_TABLET | Freq: Every day | ORAL | Status: DC
  Administered 2017-05-05: 1000 mg via ORAL
  Filled 2017-05-05: qty 4

## 2017-05-05 MED ORDER — METHOCARBAMOL 500 MG PO TABS: 500.0000 mg | ORAL_TABLET | Freq: Four times a day (QID) | ORAL | Status: DC | PRN

## 2017-05-05 MED ORDER — SODIUM CHLORIDE 0.9% FLUSH
3.0000 mL | Freq: Two times a day (BID) | INTRAVENOUS | Status: DC
  Administered 2017-05-05 – 2017-05-06 (×2): 3 mL via INTRAVENOUS

## 2017-05-05 MED ORDER — ESCITALOPRAM OXALATE 10 MG PO TABS
10.0000 mg | ORAL_TABLET | Freq: Every day | ORAL | Status: DC
  Administered 2017-05-05: 10 mg via ORAL
  Filled 2017-05-05: qty 1

## 2017-05-05 MED ORDER — ATORVASTATIN CALCIUM 20 MG PO TABS
20.0000 mg | ORAL_TABLET | Freq: Every day | ORAL | Status: DC
  Administered 2017-05-05: 20 mg via ORAL
  Filled 2017-05-05: qty 1

## 2017-05-05 MED ORDER — MELOXICAM 15 MG PO TABS
15.0000 mg | ORAL_TABLET | Freq: Every day | ORAL | Status: DC
  Administered 2017-05-06: 15 mg via ORAL
  Filled 2017-05-05: qty 1

## 2017-05-05 MED ORDER — OXYCODONE-ACETAMINOPHEN 5-325 MG PO TABS
2.0000 | ORAL_TABLET | Freq: Once | ORAL | Status: AC
  Administered 2017-05-05: 2 via ORAL
  Filled 2017-05-05: qty 2

## 2017-05-05 MED ORDER — OMEGA-3-ACID ETHYL ESTERS 1 G PO CAPS
1.0000 g | ORAL_CAPSULE | Freq: Two times a day (BID) | ORAL | Status: DC
  Administered 2017-05-05 – 2017-05-06 (×2): 1 g via ORAL
  Filled 2017-05-05 (×2): qty 1

## 2017-05-05 MED ORDER — ACETAMINOPHEN 325 MG PO TABS: 650.0000 mg | ORAL_TABLET | Freq: Four times a day (QID) | ORAL | Status: DC | PRN

## 2017-05-05 NOTE — ED Provider Notes (Signed)
[place] DEPT Provider Note   CSN: [id number] Arrival date & time: 05/05/17  4970     History   Chief Complaint Chief Complaint  Patient presents with  . Back Pain    HPI [name] is a 56 y.o. female.  HPI 56 yo F with PMHx chronic back pain here with ongoing, worsening lower back pain. One week ago, pt had ground level mechanical fall where she hit buttocks Since then, she has had continued acute on chronic LBP She was seen by Orthopedist on Thursday last week, 2 d after fall Has not had any imaging but ortho did not feel bony abnormality was likely Scheduled for MRI for chornic LBP in a few weeks Orthopedist switched her to meloxicam/baclofen for muscle spasms Since then, pt c/o chronic, worsening pain in left buttocks area Pain is worse with movement, palpation     Past Medical History:  Diagnosis Date  . Anxiety   . Depression   . Hypertension   . Migraines     Patient Active Problem List   Diagnosis Date Noted  . HTN (hypertension) 05/05/2017  . Elevated lactic acid level 05/05/2017  . Hypothyroidism 02/24/2017  . Mixed hyperlipidemia 02/24/2017  . 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 Sleep related headaches 01/23/2017  . Snoring 01/23/2017  . 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 Chronic headaches 10/30/2016  . Obesity 05/24/2016  . PMB (postmenopausal bleeding) 05/24/2016  . Back pain of lumbar region with sciatica 09/28/2015    Past Surgical History:  Procedure Laterality Date  . NO PAST SURGERIES      OB History    No data available       Home Medications    Prior to Admission medications   Medication Sig Start Date End Date Taking? Authorizing Provider  acetaminophen-codeine (TYLENOL #3) 300-30 MG tablet Take 1 tablet by mouth every 8 (eight) hours as needed for severe pain. 04/04/17  Yes Hairston, Maylon Peppers, FNP  amitriptyline (ELAVIL) 25 MG tablet Take 1 tablet (25 mg total) by mouth at bedtime. 02/10/17  Yes  Hairston, Maylon Peppers, FNP  atorvastatin (LIPITOR) 20 MG tablet Take 1 tablet (20 mg total) by mouth daily. 02/24/17  Yes Hairston, Maylon Peppers, FNP  baclofen (LIORESAL) 10 MG tablet Take 1 tablet (10 mg total) by mouth every 8 (eight) hours as needed for muscle spasms (Pain). 05/01/17  Yes Magnus Sinning, MD  Cholecalciferol (VITAMIN D) 2000 units tablet Take 2,000 Units by mouth daily.   Yes [provider]  divalproex (DEPAKOTE) 500 MG DR tablet Take one tablet in the morning and 2 at night Patient taking differently: Take 500-1,000 mg by mouth See admin instructions. Take 500mg  in the morning and 1000mg  at night 10/29/16  Yes Melvenia Beam, MD  escitalopram (LEXAPRO) 10 MG tablet Take 10 mg by mouth at bedtime. 01/13/17  Yes [provider]  ibuprofen (ADVIL,MOTRIN) 600 MG tablet Take 1 tablet (600 mg total) by mouth every 8 (eight) hours as needed (Take with food). 04/04/17  Yes Hairston, Maylon Peppers, FNP  levothyroxine (SYNTHROID, LEVOTHROID) 50 MCG tablet Take 1 tablet (50 mcg total) by mouth daily. 04/09/17  Yes Hairston, Maylon Peppers, FNP  meloxicam (MOBIC) 15 MG tablet Take 1 tablet (15 mg total) by mouth daily. Take with food 05/01/17  Yes Magnus Sinning, MD  Omega-3 Fatty Acids (FISH OIL) 1000 MG CAPS Take 1,000 mg by mouth 2 (two) times daily.   Yes [provider]  propranolol (INDERAL)  80 MG tablet Take 1 tablet (80 mg total) by mouth 2 (two) times daily. 02/10/17  Yes Alfonse Spruce, FNP  aspirin EC 81 MG tablet Take 1 tablet (81 mg total) by mouth daily. Patient not taking: Reported on 05/01/2017 02/14/17   Alfonse Spruce, FNP    Family History Family History  Problem Relation Age of Onset  . Headache Neg Hx     Social History Social History  Substance Use Topics  . Smoking status: Never Smoker  . Smokeless tobacco: Never Used  . Alcohol use No     Allergies   Iron and Penicillins   Review of Systems Review of Systems  Constitutional:  Positive for fatigue.  Musculoskeletal: Positive for arthralgias and back pain.  Neurological: Positive for headaches.  All other systems reviewed and are negative.    Physical Exam Updated Vital Signs BP (!) 153/95   Pulse 90   Temp 98.6 F (37 C) (Oral)   Resp 16   LMP 06/06/2015   SpO2 94%   Physical Exam  Constitutional: She is oriented to person, place, and time. She appears well-developed and well-nourished. No distress.  HENT:  Head: Normocephalic and atraumatic.  Eyes: Conjunctivae are normal.  Neck: Neck supple.  Cardiovascular: Normal rate, regular rhythm and normal heart sounds.  Exam reveals no friction rub.   No murmur heard. Pulmonary/Chest: Effort normal and breath sounds normal. No respiratory distress. She has no wheezes. She has no rales.  Abdominal: She exhibits no distension.  Musculoskeletal: She exhibits tenderness (significant diffuse TTP over left flank, left paraspinal lower lumbar spine without deformity). She exhibits no edema.  Neurological: She is alert and oriented to person, place, and time. She exhibits normal muscle tone.  Strength 5/5 b/l LE, normal sensation to light touch b/l LE  Skin: Skin is warm. Capillary refill takes less than 2 seconds.  Psychiatric: She has a normal mood and affect.  Nursing note and vitals reviewed.    ED Treatments / Results  Labs (all labs ordered are listed, but only abnormal results are displayed) Labs Reviewed  BASIC METABOLIC PANEL - Abnormal; Notable for the following:       Result Value   Glucose, Bld 126 (*)    All other components within normal limits  URINALYSIS, ROUTINE W REFLEX MICROSCOPIC - Abnormal; Notable for the following:    Color, Urine AMBER (*)    APPearance CLOUDY (*)    Hgb urine dipstick SMALL (*)    Ketones, ur 5 (*)    Protein, ur 30 (*)    Leukocytes, UA TRACE (*)    Bacteria, UA RARE (*)    Squamous Epithelial / LPF 6-30 (*)    All other components within normal limits    I-STAT CG4 LACTIC ACID, ED - Abnormal; Notable for the following:    Lactic Acid, Venous 2.10 (*)    All other components within normal limits  I-STAT CG4 LACTIC ACID, ED - Abnormal; Notable for the following:    Lactic Acid, Venous 2.50 (*)    All other components within normal limits  I-STAT CG4 LACTIC ACID, ED - Abnormal; Notable for the following:    Lactic Acid, Venous 2.83 (*)    All other components within normal limits  URINE CULTURE  CULTURE, BLOOD (ROUTINE X 2)  CULTURE, BLOOD (ROUTINE X 2)  CBC WITH DIFFERENTIAL/PLATELET  TSH  T4, FREE  PROCALCITONIN  BRAIN NATRIURETIC PEPTIDE  VALPROIC ACID LEVEL  I-STAT TROPOININ, ED  I-STAT  BETA HCG BLOOD, ED ([facility], [facility], [facility] ONLY)  I-STAT TROPOININ, ED    EKG  EKG Interpretation None       Radiology Ct Abdomen Pelvis W Contrast  Result Date: 05/05/2017 CLINICAL DATA:  Right-sided abdominal pain. The patient fell 1 week ago. EXAM: CT ABDOMEN AND PELVIS WITH CONTRAST TECHNIQUE: Multidetector CT imaging of the abdomen and pelvis was performed using the standard protocol following bolus administration of intravenous contrast. CONTRAST:  17mL ISOVUE-300 IOPAMIDOL (ISOVUE-300) INJECTION 61% COMPARISON:  None. FINDINGS: Lower chest: Negative. Hepatobiliary: Hepatic steatosis.  Otherwise negative. Pancreas: Unremarkable. No pancreatic ductal dilatation or surrounding inflammatory changes. Spleen: Normal in size without focal abnormality. Adrenals/Urinary Tract: Normal adrenal glands. 4 mm cyst on the lower pole the left kidney. Kidneys are otherwise normal. No hydronephrosis. Ureters and bladder appear normal. Stomach/Bowel: Stomach is within normal limits. Appendix appears normal. No evidence of bowel wall thickening, distention, or inflammatory changes. Vascular/Lymphatic: Aortic atherosclerosis. No enlarged abdominal or pelvic lymph nodes. Reproductive: Small uterine fibroids.  Normal appearing ovaries. Other: No abdominal wall hernia or  abnormality. No abdominopelvic ascites. Musculoskeletal: No acute or significant osseous findings. IMPRESSION: 1. No acute abnormality. 2. Hepatic steatosis. 3. Small uterine fibroids. 4. Aortic atherosclerosis. Electronically Signed   By: Lorriane Shire M.D.   On: 05/05/2017 13:50    Procedures Procedures (including critical care time)  Medications Ordered in ED Medications  cefTRIAXone (ROCEPHIN) 2 g in dextrose 5 % 50 mL IVPB (not administered)  labetalol (NORMODYNE,TRANDATE) injection 10 mg (not administered)  methocarbamol (ROBAXIN) tablet 500 mg (not administered)  sodium chloride 0.9 % bolus 1,000 mL (0 mLs Intravenous Stopped 05/05/17 1259)  morphine 4 MG/ML injection 4 mg (4 mg Intravenous Given 05/05/17 1151)  ondansetron (ZOFRAN) injection 4 mg (4 mg Intravenous Given 05/05/17 1151)  sodium chloride 0.9 % bolus 1,000 mL (0 mLs Intravenous Stopped 05/05/17 1435)  morphine 4 MG/ML injection 4 mg (4 mg Intravenous Given 05/05/17 1303)  iopamidol (ISOVUE-300) 61 % injection (100 mLs  Contrast Given 05/05/17 1325)  labetalol (NORMODYNE,TRANDATE) injection 20 mg (20 mg Intravenous Given 05/05/17 1449)  propranolol (INDERAL) tablet 40 mg (40 mg Oral Given 05/05/17 1606)  oxyCODONE-acetaminophen (PERCOCET/ROXICET) 5-325 MG per tablet 2 tablet (2 tablets Oral Given 05/05/17 1529)     Initial Impression / Assessment and Plan / ED Course  I have reviewed the triage vital signs and the nursing notes.  Pertinent labs & imaging results that were available during my care of the patient were reviewed by me and considered in my medical decision making (see chart for details).    56 yo M with PMHx as above here with lower back pain, mild headache. On arrival, pt markedly hypertensive, tachycardic, but neuro exam is non-focal. Imaging negative for acute traumatic abnormality. Lab work c/f increasing lactic acidosis, possible UTI thouguh normal WBC.  Regarding her back pain, suspect chronic LBP 2/2  known radiculopathy. No signs of cauda equina. No evidence to suggest epidural abscess or osteo. Tx supportively.  Regarding her HTN, pt did miss her dose of meds but given IV labetalol here, half PO dose, and pt persistently markedly hypertensive. Lab work shows elevated LA which is c/f possible worsening end-organ dysfunction 2/2 hypertension. Admit for antiHTN, trending of trops.  Final Clinical Impressions(s) / ED Diagnoses   Final diagnoses:  Chronic bilateral low back pain without sciatica  Hypertensive urgency    New Prescriptions New Prescriptions   No medications on file     Duffy Bruce, MD  05/05/17 1813  

## 2017-05-05 NOTE — ED Notes (Signed)
Patient to CT.

## 2017-05-05 NOTE — H&P (Signed)
History and Physical    [name] JGO:[id number] DOB: 12-28-1960 DOA: 05/05/2017  Referring MD/NP/PA: er PCP: Alfonse Spruce, FNP Outpatient Specialists:  Patient coming from: home  Chief Complaint: back pain  HPI: [name] is a 56 y.o. female with medical history significant of chronic back pain, anxiety, HTN.  She is a poor historian.  Patient has chronic back pain and had a fall recently that exacerbated her pain.  Heating bottle worked at home but did not stay hot.  In the Er, patient was found to have an elevated BP despite pain treatment and BP medications.  She occasionally has a headache but not sure if it is related to her BP.  Her lactic acid was also elevated.  The ER physician requested observation for further management    Review of Systems: all systems reviewed, negative unless stated above in HPI   Past Medical History:  Diagnosis Date  . Anxiety   . Depression   . Hypertension   . Migraines     Past Surgical History:  Procedure Laterality Date  . NO PAST SURGERIES       reports that she has never smoked. She has never used smokeless tobacco. She reports that she does not drink alcohol or use drugs.  Allergies  Allergen Reactions  . Iron Diarrhea  . Penicillins Nausea And Vomiting    Has patient had a PCN reaction causing immediate rash, facial/tongue/throat swelling, SOB or lightheadedness with hypotension: No Has patient had a PCN reaction causing severe rash involving mucus membranes or skin necrosis: No Has patient had a PCN reaction that required hospitalization No Has patient had a PCN reaction occurring within the last 10 years: No If all of the above answers are "NO", then may proceed with Cephalosporin use.    Family History  Problem Relation Age of Onset  . Headache Neg Hx     Prior to Admission medications   Medication Sig Start Date End Date Taking? Authorizing Provider  acetaminophen-codeine (TYLENOL #3) 300-30 MG tablet  Take 1 tablet by mouth every 8 (eight) hours as needed for severe pain. 04/04/17  Yes Hairston, Maylon Peppers, FNP  amitriptyline (ELAVIL) 25 MG tablet Take 1 tablet (25 mg total) by mouth at bedtime. 02/10/17  Yes Hairston, Maylon Peppers, FNP  atorvastatin (LIPITOR) 20 MG tablet Take 1 tablet (20 mg total) by mouth daily. 02/24/17  Yes Hairston, Maylon Peppers, FNP  baclofen (LIORESAL) 10 MG tablet Take 1 tablet (10 mg total) by mouth every 8 (eight) hours as needed for muscle spasms (Pain). 05/01/17  Yes Magnus Sinning, MD  Cholecalciferol (VITAMIN D) 2000 units tablet Take 2,000 Units by mouth daily.   Yes [provider]  divalproex (DEPAKOTE) 500 MG DR tablet Take one tablet in the morning and 2 at night Patient taking differently: Take 500-1,000 mg by mouth See admin instructions. Take 500mg  in the morning and 1000mg  at night 10/29/16  Yes Melvenia Beam, MD  escitalopram (LEXAPRO) 10 MG tablet Take 10 mg by mouth at bedtime. 01/13/17  Yes [provider]  ibuprofen (ADVIL,MOTRIN) 600 MG tablet Take 1 tablet (600 mg total) by mouth every 8 (eight) hours as needed (Take with food). 04/04/17  Yes Hairston, Maylon Peppers, FNP  levothyroxine (SYNTHROID, LEVOTHROID) 50 MCG tablet Take 1 tablet (50 mcg total) by mouth daily. 04/09/17  Yes Hairston, Maylon Peppers, FNP  meloxicam (MOBIC) 15 MG tablet Take 1 tablet (15 mg total) by mouth daily. Take with food  05/01/17  Yes Magnus Sinning, MD  Omega-3 Fatty Acids (FISH OIL) 1000 MG CAPS Take 1,000 mg by mouth 2 (two) times daily.   Yes [provider]  propranolol (INDERAL) 80 MG tablet Take 1 tablet (80 mg total) by mouth 2 (two) times daily. 02/10/17  Yes Alfonse Spruce, FNP  aspirin EC 81 MG tablet Take 1 tablet (81 mg total) by mouth daily. Patient not taking: Reported on 05/01/2017 02/14/17   Alfonse Spruce, FNP    Physical Exam: Vitals:   05/05/17 1353 05/05/17 1500 05/05/17 1600 05/05/17 1709  BP: (!) 198/105 140/88 124/89 (!)  160/110  Pulse: (!) 120 91 95   Resp: 16 15 20    Temp:      TempSrc:      SpO2: 98% 95% 99%       Constitutional: NAD, calm, comfortable- slow to answer questions Vitals:   05/05/17 1353 05/05/17 1500 05/05/17 1600 05/05/17 1709  BP: (!) 198/105 140/88 124/89 (!) 160/110  Pulse: (!) 120 91 95   Resp: 16 15 20    Temp:      TempSrc:      SpO2: 98% 95% 99%    Eyes: PERRL, lids and conjunctivae normal ENMT: Mucous membranes are moist. Posterior pharynx clear of any exudate or lesions.Normal dentition.  Neck: normal, supple, no masses, no thyromegaly Respiratory: clear to auscultation bilaterally, no wheezing, no crackles. Normal respiratory effort. No accessory muscle use.  Cardiovascular: Regular rate and rhythm, no murmurs / rubs / gallops. No extremity edema. 2+ pedal pulses. No carotid bruits.  Abdomen: no tenderness, no masses palpated. No hepatosplenomegaly. Bowel sounds positive.  Musculoskeletal: no clubbing / cyanosis. No joint deformity upper and lower extremities. Good ROM, no contractures. Normal muscle tone.  Skin: no rashes, lesions, ulcers. No induration Neurologic: CN 2-12 grossly intact. Sensation intact, DTR normal. Strength 5/5 in all 4.  Psychiatric: cognitively challanged    Labs on Admission: I have personally reviewed following labs and imaging studies  CBC:  Recent Labs Lab 05/05/17 1108  WBC 5.7  NEUTROABS 3.1  HGB 14.6  HCT 42.0  MCV 90.5  PLT 370   Basic Metabolic Panel:  Recent Labs Lab 05/05/17 1108  NA 137  K 3.8  CL 106  CO2 23  GLUCOSE 126*  BUN 11  CREATININE 0.94  CALCIUM 9.4   GFR: CrCl cannot be calculated (Unknown ideal weight.). Liver Function Tests: No results for input(s): AST, ALT, ALKPHOS, BILITOT, PROT, ALBUMIN in the last 168 hours. No results for input(s): LIPASE, AMYLASE in the last 168 hours. No results for input(s): AMMONIA in the last 168 hours. Coagulation Profile: No results for input(s): INR, PROTIME  in the last 168 hours. Cardiac Enzymes: No results for input(s): CKTOTAL, CKMB, CKMBINDEX, TROPONINI in the last 168 hours. BNP (last 3 results) No results for input(s): PROBNP in the last 8760 hours. HbA1C: No results for input(s): HGBA1C in the last 72 hours. CBG: No results for input(s): GLUCAP in the last 168 hours. Lipid Profile: No results for input(s): CHOL, HDL, LDLCALC, TRIG, CHOLHDL, LDLDIRECT in the last 72 hours. Thyroid Function Tests:  Recent Labs  05/05/17 1201  TSH 1.844  FREET4 0.83   Anemia Panel: No results for input(s): VITAMINB12, FOLATE, FERRITIN, TIBC, IRON, RETICCTPCT in the last 72 hours. Urine analysis:    Component Value Date/Time   COLORURINE AMBER (A) 05/05/2017 1201   APPEARANCEUR CLOUDY (A) 05/05/2017 1201   LABSPEC 1.027 05/05/2017 1201   PHURINE 5.0  05/05/2017 [place] 05/05/2017 1201   HGBUR SMALL (A) 05/05/2017 1201   BILIRUBINUR NEGATIVE 05/05/2017 1201   KETONESUR 5 (A) 05/05/2017 1201   PROTEINUR 30 (A) 05/05/2017 1201   UROBILINOGEN 0.2 05/20/2015 1201   NITRITE NEGATIVE 05/05/2017 1201   LEUKOCYTESUR TRACE (A) 05/05/2017 1201   Sepsis Labs: Invalid input(s): PROCALCITONIN, LACTICIDVEN No results found for this or any previous visit (from the past 240 hour(s)).   Radiological Exams on Admission: Ct Abdomen Pelvis W Contrast  Result Date: 05/05/2017 CLINICAL DATA:  Right-sided abdominal pain. The patient fell 1 week ago. EXAM: CT ABDOMEN AND PELVIS WITH CONTRAST TECHNIQUE: Multidetector CT imaging of the abdomen and pelvis was performed using the standard protocol following bolus administration of intravenous contrast. CONTRAST:  168mL ISOVUE-300 IOPAMIDOL (ISOVUE-300) INJECTION 61% COMPARISON:  None. FINDINGS: Lower chest: Negative. Hepatobiliary: Hepatic steatosis.  Otherwise negative. Pancreas: Unremarkable. No pancreatic ductal dilatation or surrounding inflammatory changes. Spleen: Normal in size without focal  abnormality. Adrenals/Urinary Tract: Normal adrenal glands. 4 mm cyst on the lower pole the left kidney. Kidneys are otherwise normal. No hydronephrosis. Ureters and bladder appear normal. Stomach/Bowel: Stomach is within normal limits. Appendix appears normal. No evidence of bowel wall thickening, distention, or inflammatory changes. Vascular/Lymphatic: Aortic atherosclerosis. No enlarged abdominal or pelvic lymph nodes. Reproductive: Small uterine fibroids.  Normal appearing ovaries. Other: No abdominal wall hernia or abnormality. No abdominopelvic ascites. Musculoskeletal: No acute or significant osseous findings. IMPRESSION: 1. No acute abnormality. 2. Hepatic steatosis. 3. Small uterine fibroids. 4. Aortic atherosclerosis. Electronically Signed   By: Lorriane Shire M.D.   On: 05/05/2017 13:50     Assessment/Plan Active Problems:   Back pain of lumbar region with sciatica   HTN (hypertension)   Elevated lactic acid level   Elevated BP -resume home meds -add norvasc -? Due to pain  Back pain -chronic -resume home meds -add heat pad as this helped at home -robaxin -CT scan negative for RPB  Elevated lactic acid -uncertain meaning but concerned the ER physician No sign of infection -check in AM    DVT prophylaxis: ambulation Code Status: full Family Communication:  Disposition Plan:  Consults called:  Admission status: obs   [place] DO [facility] Pager [phone]  If 7PM-7AM, please contact night-coverage [email or web address] Password [email or web address]  05/05/2017, 5:59 PM

## 2017-05-05 NOTE — ED Notes (Signed)
Pt ambulated to room from waiting room, tolerated well. 

## 2017-05-05 NOTE — ED Triage Notes (Signed)
To ED for eval of right flank pain since falling from a chair last Tuesday. Pt with noted swelling to area and slight change in color to skin. Painful to touch. No difficulty with urination. No vomiting. Noted to be tachy in triage

## 2017-05-05 NOTE — ED Notes (Signed)
Pt ambulated to restroom, no issues. 

## 2017-05-05 NOTE — Progress Notes (Signed)
   05/05/17 2042  Vitals  Temp 97.9 F (36.6 C)  Temp Source Oral  BP (!) 167/104  BP Location Right Arm  BP Method Automatic  Patient Position (if appropriate) Lying  Pulse Rate 90  Pulse Rate Source Dinamap  Resp 17  Oxygen Therapy  SpO2 98 %  O2 Device Room Air  Height and Weight  Height 5\' 7"  (1.702 m)  Weight 92.7 kg (204 lb 6.4 oz) (Scale C)  Type of Scale Used Standing  Type of Weight Actual  BSA (Calculated - sq m) 2.09 sq meters  BMI (Calculated) 32.1  Weight in (lb) to have BMI = 25 159.3  Admitted patient to rm 3E11 from ED, pt alert and oriented, son at bedside, oriented to room, call bell placed within reach, cardiac monitor placed, CCMD made aware, educated on patient's safety plan. Will continue to monitor.

## 2017-05-06 DIAGNOSIS — M5440 Lumbago with sciatica, unspecified side: Secondary | ICD-10-CM | POA: Diagnosis not present

## 2017-05-06 DIAGNOSIS — R7989 Other specified abnormal findings of blood chemistry: Secondary | ICD-10-CM | POA: Diagnosis not present

## 2017-05-06 DIAGNOSIS — I10 Essential (primary) hypertension: Secondary | ICD-10-CM | POA: Diagnosis not present

## 2017-05-06 LAB — CBC
HCT: 40 % (ref 36.0–46.0)
Hemoglobin: 13.4 g/dL (ref 12.0–15.0)
MCH: 30.7 pg (ref 26.0–34.0)
MCHC: 33.5 g/dL (ref 30.0–36.0)
MCV: 91.5 fL (ref 78.0–100.0)
PLATELETS: 248 10*3/uL (ref 150–400)
RBC: 4.37 MIL/uL (ref 3.87–5.11)
RDW: 13.7 % (ref 11.5–15.5)
WBC: 5.7 10*3/uL (ref 4.0–10.5)

## 2017-05-06 LAB — BASIC METABOLIC PANEL
Anion gap: 7 (ref 5–15)
BUN: 9 mg/dL (ref 6–20)
CO2: 24 mmol/L (ref 22–32)
CREATININE: 0.79 mg/dL (ref 0.44–1.00)
Calcium: 8.9 mg/dL (ref 8.9–10.3)
Chloride: 104 mmol/L (ref 101–111)
Glucose, Bld: 147 mg/dL — ABNORMAL HIGH (ref 65–99)
POTASSIUM: 4.1 mmol/L (ref 3.5–5.1)
SODIUM: 135 mmol/L (ref 135–145)

## 2017-05-06 LAB — GLUCOSE, CAPILLARY
Glucose-Capillary: 117 mg/dL — ABNORMAL HIGH (ref 65–99)
Glucose-Capillary: 115 mg/dL — ABNORMAL HIGH (ref 65–99)

## 2017-05-06 LAB — LACTIC ACID, PLASMA: Lactic Acid, Venous: 2.3 mmol/L (ref 0.5–1.9)

## 2017-05-06 MED ORDER — AMLODIPINE BESYLATE 5 MG PO TABS: 5.0000 mg | ORAL_TABLET | Freq: Every day | ORAL | 0 refills | Status: DC

## 2017-05-06 NOTE — Discharge Summary (Signed)
Physician Discharge Summary  [name]:[id number] DOB: 20-Aug-1961 DOA: 05/05/2017  PCP: Alfonse Spruce, FNP  Admit date: 05/05/2017 Discharge date: 05/06/2017   Recommendations for Outpatient Follow-Up:   1. Close BP management 2. Ortho management of back pain   Discharge Diagnosis:   Active Problems:   Back pain of lumbar region with sciatica   HTN (hypertension)   Elevated lactic acid level   Discharge disposition:  Home.  Discharge Condition: Improved.  Diet recommendation: Low sodium, heart healthy.  Carbohydrate-modified.  Regular.  Wound care: None.   History of Present Illness:   [name] is a 56 y.o. female with medical history significant of chronic back pain, anxiety, HTN.  She is a poor historian.  Patient has chronic back pain and had a fall recently that exacerbated her pain.  Heating bottle worked at home but did not stay hot.  In the Er, patient was found to have an elevated BP despite pain treatment and BP medications.  She occasionally has a headache but not sure if it is related to her BP.  Her lactic acid was also elevated.  The ER physician requested observation for further management   Hospital Course by Problem:   Elevated BP -resume home meds -add norvasc -outpatient follow up  Back pain -chronic -resume home meds -add heat pad as this helped at home -CT scan negative for RPB  Elevated lactic acid No sign of infection -down with no intervention but BP management    Medical Consultants:    None.   Discharge Exam:   Vitals:   05/06/17 0911 05/06/17 1114  BP: 137/74 135/84  Pulse: 80 80  Resp:  18  Temp:  98.7 F (37.1 C)   Vitals:   05/06/17 0519 05/06/17 0735 05/06/17 0911 05/06/17 1114  BP: (!) 147/85 134/78 137/74 135/84  Pulse: 89 87 80 80  Resp: 17 18  18   Temp: 98.1 F (36.7 C) 98.1 F (36.7 C)  98.7 F (37.1 C)  TempSrc: Oral Oral  Oral  SpO2: 98% 94%  95%  Weight: 92.1 kg (203 lb)      Height:        Gen:  NAD    The results of significant diagnostics from this hospitalization (including imaging, microbiology, ancillary and laboratory) are listed below for reference.     Procedures and Diagnostic Studies:   Dg Chest 2 View  Result Date: 05/05/2017 CLINICAL DATA:  [place] on stool and hurt lower back. Hypertensive urgency. EXAM: CHEST  2 VIEW COMPARISON:  10/19/2013 chest radiograph FINDINGS: Stable heart size and mediastinal contours are within normal limits. Both lungs are clear. The visualized skeletal structures are unremarkable. IMPRESSION: No active cardiopulmonary disease. Electronically Signed   By: Kristine Garbe M.D.   On: 05/05/2017 19:08   Ct Abdomen Pelvis W Contrast  Result Date: 05/05/2017 CLINICAL DATA:  Right-sided abdominal pain. The patient fell 1 week ago. EXAM: CT ABDOMEN AND PELVIS WITH CONTRAST TECHNIQUE: Multidetector CT imaging of the abdomen and pelvis was performed using the standard protocol following bolus administration of intravenous contrast. CONTRAST:  136mL ISOVUE-300 IOPAMIDOL (ISOVUE-300) INJECTION 61% COMPARISON:  None. FINDINGS: Lower chest: Negative. Hepatobiliary: Hepatic steatosis.  Otherwise negative. Pancreas: Unremarkable. No pancreatic ductal dilatation or surrounding inflammatory changes. Spleen: Normal in size without focal abnormality. Adrenals/Urinary Tract: Normal adrenal glands. 4 mm cyst on the lower pole the left kidney. Kidneys are otherwise normal. No hydronephrosis. Ureters and bladder appear normal. Stomach/Bowel: Stomach is within  normal limits. Appendix appears normal. No evidence of bowel wall thickening, distention, or inflammatory changes. Vascular/Lymphatic: Aortic atherosclerosis. No enlarged abdominal or pelvic lymph nodes. Reproductive: Small uterine fibroids.  Normal appearing ovaries. Other: No abdominal wall hernia or abnormality. No abdominopelvic ascites. Musculoskeletal: No acute or significant  osseous findings. IMPRESSION: 1. No acute abnormality. 2. Hepatic steatosis. 3. Small uterine fibroids. 4. Aortic atherosclerosis. Electronically Signed   By: Lorriane Shire M.D.   On: 05/05/2017 13:50     Labs:   Basic Metabolic Panel:  Recent Labs Lab 05/05/17 1108 05/06/17 0144  NA 137 135  K 3.8 4.1  CL 106 104  CO2 23 24  GLUCOSE 126* 147*  BUN 11 9  CREATININE 0.94 0.79  CALCIUM 9.4 8.9   GFR Estimated Creatinine Clearance: 92.6 mL/min (by C-G formula based on SCr of 0.79 mg/dL). Liver Function Tests: No results for input(s): AST, ALT, ALKPHOS, BILITOT, PROT, ALBUMIN in the last 168 hours. No results for input(s): LIPASE, AMYLASE in the last 168 hours. No results for input(s): AMMONIA in the last 168 hours. Coagulation profile No results for input(s): INR, PROTIME in the last 168 hours.  CBC:  Recent Labs Lab 05/05/17 1108 05/06/17 0144  WBC 5.7 5.7  NEUTROABS 3.1  --   HGB 14.6 13.4  HCT 42.0 40.0  MCV 90.5 91.5  PLT 229 248   Cardiac Enzymes: No results for input(s): CKTOTAL, CKMB, CKMBINDEX, TROPONINI in the last 168 hours. BNP: Invalid input(s): POCBNP CBG:  Recent Labs Lab 05/06/17 0734 05/06/17 1131  GLUCAP 117* 115*   D-Dimer No results for input(s): DDIMER in the last 72 hours. Hgb A1c No results for input(s): HGBA1C in the last 72 hours. Lipid Profile No results for input(s): CHOL, HDL, LDLCALC, TRIG, CHOLHDL, LDLDIRECT in the last 72 hours. Thyroid function studies  Recent Labs  05/05/17 1201  TSH 1.844   Anemia work up No results for input(s): VITAMINB12, FOLATE, FERRITIN, TIBC, IRON, RETICCTPCT in the last 72 hours. Microbiology No results found for this or any previous visit (from the past 240 hour(s)).   Discharge Instructions:   Discharge Instructions    Diet - low sodium heart healthy    Complete by:  As directed    Discharge instructions    Complete by:  As directed    Use heating pad on back for pain control    Increase activity slowly    Complete by:  As directed      Allergies as of 05/06/2017      Reactions   Iron Diarrhea   Penicillins Nausea And Vomiting   Has patient had a PCN reaction causing immediate rash, facial/tongue/throat swelling, SOB or lightheadedness with hypotension: No Has patient had a PCN reaction causing severe rash involving mucus membranes or skin necrosis: No Has patient had a PCN reaction that required hospitalization No Has patient had a PCN reaction occurring within the last 10 years: No If all of the above answers are "NO", then may proceed with Cephalosporin use.      Medication List    STOP taking these medications   aspirin EC 81 MG tablet   ibuprofen 600 MG tablet Commonly known as:  ADVIL,MOTRIN     TAKE these medications   acetaminophen-codeine 300-30 MG tablet Commonly known as:  TYLENOL #3 Take 1 tablet by mouth every 8 (eight) hours as needed for severe pain.   amitriptyline 25 MG tablet Commonly known as:  ELAVIL Take 1 tablet (25 mg total)  by mouth at bedtime.   amLODipine 5 MG tablet Commonly known as:  NORVASC Take 1 tablet (5 mg total) by mouth daily.   atorvastatin 20 MG tablet Commonly known as:  LIPITOR Take 1 tablet (20 mg total) by mouth daily.   baclofen 10 MG tablet Commonly known as:  LIORESAL Take 1 tablet (10 mg total) by mouth every 8 (eight) hours as needed for muscle spasms (Pain).   divalproex 500 MG DR tablet Commonly known as:  DEPAKOTE Take one tablet in the morning and 2 at night What changed:  how much to take  how to take this  when to take this  additional instructions   escitalopram 10 MG tablet Commonly known as:  LEXAPRO Take 10 mg by mouth at bedtime.   Fish Oil 1000 MG Caps Take 1,000 mg by mouth 2 (two) times daily.   levothyroxine 50 MCG tablet Commonly known as:  SYNTHROID, LEVOTHROID Take 1 tablet (50 mcg total) by mouth daily.   meloxicam 15 MG tablet Commonly known as:   MOBIC Take 1 tablet (15 mg total) by mouth daily. Take with food   propranolol 80 MG tablet Commonly known as:  INDERAL Take 1 tablet (80 mg total) by mouth 2 (two) times daily.   Vitamin D 2000 units tablet Take 2,000 Units by mouth daily.      Follow-up Information    Alfonse Spruce, FNP. Go on 05/13/2017.   Specialty:  Family Medicine Why:  BP check 1 week @9 :30am with Ms.Stacey Contact information: [address] [place] [address] [phone]            Time coordinating discharge: 35 min  Signed:  Kasidi Shanker U Oswin Johal   [facility] 05/06/2017, 3:22 PM

## 2017-05-06 NOTE — Progress Notes (Signed)
Bath offered and the pt stated that she doesn't want it

## 2017-05-06 NOTE — Progress Notes (Signed)
Educated patient on taking BP and HR prior to taking propranolol.  Also educated patient on changing positions slowly.    Virl Diamond, Student-RN

## 2017-05-06 NOTE — Progress Notes (Signed)
Assisted patient to bedside commode and back to bed.  Bed alarm turned on.

## 2017-05-06 NOTE — Progress Notes (Signed)
Pt's lactic acid is 2.3. K. Schorr NP notified, no new orders made, will continue to monitor.

## 2017-05-07 LAB — HIV ANTIBODY (ROUTINE TESTING W REFLEX): HIV Screen 4th Generation wRfx: NONREACTIVE

## 2017-05-08 LAB — URINE CULTURE: SPECIAL REQUESTS: NORMAL

## 2017-05-10 LAB — BLOOD CULTURE ID PANEL (REFLEXED)
Acinetobacter baumannii: NOT DETECTED
CANDIDA PARAPSILOSIS: NOT DETECTED
Candida albicans: NOT DETECTED
Candida glabrata: NOT DETECTED
Candida krusei: NOT DETECTED
Candida tropicalis: NOT DETECTED
ENTEROCOCCUS SPECIES: NOT DETECTED
ESCHERICHIA COLI: NOT DETECTED
Enterobacter cloacae complex: NOT DETECTED
Enterobacteriaceae species: NOT DETECTED
HAEMOPHILUS INFLUENZAE: NOT DETECTED
Klebsiella oxytoca: NOT DETECTED
Klebsiella pneumoniae: NOT DETECTED
Listeria monocytogenes: NOT DETECTED
Neisseria meningitidis: NOT DETECTED
PROTEUS SPECIES: NOT DETECTED
PSEUDOMONAS AERUGINOSA: NOT DETECTED
SERRATIA MARCESCENS: NOT DETECTED
STAPHYLOCOCCUS AUREUS BCID: NOT DETECTED
STAPHYLOCOCCUS SPECIES: NOT DETECTED
STREPTOCOCCUS PNEUMONIAE: NOT DETECTED
STREPTOCOCCUS SPECIES: NOT DETECTED
Streptococcus agalactiae: NOT DETECTED
Streptococcus pyogenes: NOT DETECTED

## 2017-05-10 LAB — CULTURE, BLOOD (ROUTINE X 2)
CULTURE: NO GROWTH
SPECIAL REQUESTS: ADEQUATE

## 2017-05-12 ENCOUNTER — Encounter (INDEPENDENT_AMBULATORY_CARE_PROVIDER_SITE_OTHER): Payer: Self-pay | Admitting: Physical Medicine and Rehabilitation

## 2017-05-13 ENCOUNTER — Other Ambulatory Visit (INDEPENDENT_AMBULATORY_CARE_PROVIDER_SITE_OTHER): Payer: Self-pay | Admitting: Physical Medicine and Rehabilitation

## 2017-05-13 ENCOUNTER — Ambulatory Visit
Admission: RE | Admit: 2017-05-13 | Discharge: 2017-05-13 | Disposition: A | Discharge status: Home / Self Care | Payer: Medicaid Other | Source: Ambulatory Visit | Attending: Family Medicine | Admitting: Family Medicine

## 2017-05-13 ENCOUNTER — Ambulatory Visit: Payer: Medicaid Other | Attending: Family Medicine | Admitting: Pharmacist

## 2017-05-13 VITALS — BP 136/89 | HR 120

## 2017-05-13 DIAGNOSIS — Z79899 Other long term (current) drug therapy: Secondary | ICD-10-CM | POA: Diagnosis not present

## 2017-05-13 DIAGNOSIS — Z0000 Encounter for general adult medical examination without abnormal findings: Secondary | ICD-10-CM

## 2017-05-13 DIAGNOSIS — I10 Essential (primary) hypertension: Secondary | ICD-10-CM

## 2017-05-13 DIAGNOSIS — R21 Rash and other nonspecific skin eruption: Secondary | ICD-10-CM | POA: Insufficient documentation

## 2017-05-13 LAB — CULTURE, BLOOD (ROUTINE X 2): SPECIAL REQUESTS: ADEQUATE

## 2017-05-13 NOTE — Progress Notes (Signed)
   S:    Patient arrives in good spirits.    Presents to the clinic for hypertension evaluation. Patient was referred on 04/04/17.  Patient was last seen by Primary Care Provider on 04/04/17.   Patient reports that she has been feeling better since leaving the hospital. She denies any adverse effects or worsening pain. However, she has a small rash on her stomach that appeared a few days ago. It doesn't really hurt.  Patient reports adherence with medications.  Current BP Medications include:  Amlodipine 5 mg daily, propranolol 80 mg BID but she stopped the propranolol at discharge because she thought she was supposed to.   Patient was recently discharged from hospital and all medications have been reviewed.   O:   Last 3 Office BP readings: BP Readings from Last 3 Encounters:  05/13/17 136/89  05/06/17 135/84  05/01/17 (!) 149/98    BMET    Component Value Date/Time   NA 135 05/06/2017 0144   NA 139 02/10/2017 1708   K 4.1 05/06/2017 0144   CL 104 05/06/2017 0144   CO2 24 05/06/2017 0144   GLUCOSE 147 (H) 05/06/2017 0144   BUN 9 05/06/2017 0144   BUN 14 02/10/2017 1708   CREATININE 0.79 05/06/2017 0144   CREATININE 0.77 09/28/2015 1420   CALCIUM 8.9 05/06/2017 0144   GFRNONAA >60 05/06/2017 0144   GFRNONAA 88 09/28/2015 1420   GFRAA >60 05/06/2017 0144   GFRAA >89 09/28/2015 1420    A/P: Hypertension longstanding/newly diagnosed currently slightly above goal of <130/80 but this is likely to missing her propranolol doses for the last few days. This is also why her heart rate is elevated. Patient to restart propranolol this morning and continue amlodipine.   Small rash on lower left quadrant of abdomen about the size of a dime. Patient's PCP did not have an opening this morning so patient will continue to monitor and let us know if the rash worsens or increases in size.   Results reviewed and written information provided.   Total time in face-to-face counseling 15  minutes.   F/U Clinic Visit with [facility] in 2 weeks as scheduled.

## 2017-05-13 NOTE — Patient Instructions (Addendum)
Thanks for coming to see me  Take propranolol 80 mg twice a day. This will help lower your blood pressure a little bit but will really help with your heart rate, which is too high today.  Follow up with [facility] in 2 weeks  Keep an eye on your rash, if it gets bigger, or worsens, come back to see [facility] sooner.

## 2017-05-14 ENCOUNTER — Other Ambulatory Visit (INDEPENDENT_AMBULATORY_CARE_PROVIDER_SITE_OTHER): Payer: Self-pay | Admitting: Physical Medicine and Rehabilitation

## 2017-05-15 ENCOUNTER — Other Ambulatory Visit: Payer: Self-pay | Admitting: Family Medicine

## 2017-05-15 ENCOUNTER — Other Ambulatory Visit (INDEPENDENT_AMBULATORY_CARE_PROVIDER_SITE_OTHER): Payer: Self-pay | Admitting: Physical Medicine and Rehabilitation

## 2017-05-15 DIAGNOSIS — R928 Other abnormal and inconclusive findings on diagnostic imaging of breast: Secondary | ICD-10-CM

## 2017-05-16 ENCOUNTER — Ambulatory Visit
Admission: RE | Admit: 2017-05-16 | Discharge: 2017-05-16 | Disposition: A | Discharge status: Home / Self Care | Payer: Medicaid Other | Source: Ambulatory Visit | Attending: Physical Medicine and Rehabilitation | Admitting: Physical Medicine and Rehabilitation

## 2017-05-16 DIAGNOSIS — M5442 Lumbago with sciatica, left side: Secondary | ICD-10-CM

## 2017-05-16 DIAGNOSIS — G8929 Other chronic pain: Secondary | ICD-10-CM

## 2017-05-16 DIAGNOSIS — M5416 Radiculopathy, lumbar region: Secondary | ICD-10-CM

## 2017-05-20 ENCOUNTER — Ambulatory Visit (INDEPENDENT_AMBULATORY_CARE_PROVIDER_SITE_OTHER): Payer: Medicaid Other | Admitting: Physical Medicine and Rehabilitation

## 2017-05-20 DIAGNOSIS — F329 Major depressive disorder, single episode, unspecified: Secondary | ICD-10-CM | POA: Diagnosis not present

## 2017-05-20 DIAGNOSIS — M5442 Lumbago with sciatica, left side: Secondary | ICD-10-CM

## 2017-05-20 DIAGNOSIS — F419 Anxiety disorder, unspecified: Secondary | ICD-10-CM | POA: Diagnosis not present

## 2017-05-20 DIAGNOSIS — M5416 Radiculopathy, lumbar region: Secondary | ICD-10-CM | POA: Diagnosis not present

## 2017-05-20 DIAGNOSIS — G8929 Other chronic pain: Secondary | ICD-10-CM

## 2017-05-20 DIAGNOSIS — F32A Depression, unspecified: Secondary | ICD-10-CM

## 2017-05-20 NOTE — Progress Notes (Signed)
No changes in her symptoms that she knows of.  States that the bruise has gotten better.

## 2017-05-20 NOTE — Progress Notes (Signed)
[name] - 56 y.o. female MRN [id number]  Date of birth: [birth year]  Office Visit Note: Visit Date: 05/20/2017 PCP: Alfonse Spruce, FNP Referred by: Orlando Penner*  Subjective: Chief Complaint  Patient presents with  . Lower Back - Follow-up    MRI Review   HPI: [name] is a 56 year old female that we recently saw for evaluation of her chronic low back pain and left radicular type referral pain in the hip and thigh. She continues to have similar symptoms in the last saw her. She feels like she is a little bit better overall after taking the meloxicam regularly and using the baclofen as needed as a muscle relaxer. Since I have seen her she has had an MRI of the lumbar spine because of the radicular nature of the pain. This MRI is reviewed with the patient today and is reviewed below and the notes. Fairly normal appearing spine for her age except for maybe small left foraminal protrusion at L2-S3 and L3-4. A little bit more prominent at L2-S3 and L3-4. No focal compression. Noted canal stenosis no other disc herniations or other worrisome problems. She reports no new symptoms or strength loss or fevers chills or night sweats. She has failed other conservative care including medications and time. She's had no specific trauma.    Review of Systems  Constitutional: Negative for chills, fever, malaise/fatigue and weight loss.  HENT: Negative for hearing loss and sinus pain.   Eyes: Negative for blurred vision and double vision.  Respiratory: Negative for cough and shortness of breath.   Cardiovascular: Negative for chest pain, palpitations and leg swelling.  Gastrointestinal: Negative for abdominal pain, nausea and vomiting.  Genitourinary: Negative for flank pain.  Musculoskeletal: Positive for back pain. Negative for myalgias.  Skin: Negative for itching and rash.  Neurological: Positive for tingling. Negative for tremors, focal weakness and weakness.    Endo/Heme/Allergies: Negative.   Psychiatric/Behavioral: Negative for depression.  All other systems reviewed and are negative.  Otherwise per HPI.  Assessment & Plan: Visit Diagnoses:  1. Lumbar radiculopathy   2. Chronic bilateral low back pain with left-sided sciatica   3. Depression, unspecified depression type   4. Anxiety     Plan: Findings:  Chronic history of off and on back pain but now with somewhat left referral pain in the hip and leg. MRI is fairly benign except for very small left foraminal protrusions at L2-S3 and L3-4. Her symptoms pattern is not very specific for L2 or L3 nerve root patterns but we know these are somewhat up scooter at times and people can have referral pain is different. Her pain likely is from the small disc protrusions as well as some generalized facet arthropathy which is mild. She does have an underlying anxiety disorder and there may be some level of myofascial pain as well. This could indeed be something like piriformis syndrome as well. I want her to complete physical therapy with specific attention to [facility] type evaluation and exercises and myofascial release. We'll put referral for that. She's going to continue the meloxicam for another month and then will I would have her issues that intermittently if needed. I think she should do well otherwise is no surgical issues in her lumbar spine is really nothing at this point warranting spine interventions.    Meds & Orders: No orders of the defined types were placed in this encounter.   Orders Placed This Encounter  Procedures  . Ambulatory referral to  Physical Therapy    Follow-up: Return if symptoms worsen or fail to improve.   Procedures: No procedures performed  No notes on file   Clinical History: MRI LUMBAR SPINE WITHOUT CONTRAST  TECHNIQUE: Multiplanar, multisequence MR imaging of the lumbar spine was performed. No intravenous contrast was administered.  COMPARISON:  CT abdomen  pelvis dated May 05, 2017; lumbar spine x-rays dated April 07, 2017.  FINDINGS: Segmentation:  Standard.  Alignment:  Physiologic.  Vertebrae:  No fracture, evidence of discitis, or bone lesion.  Conus medullaris: Extends to the L1 level and appears normal.  Paraspinal and other soft tissues: Negative.  Disc levels:  T12-L1:  Normal.  L1-L2:  Normal.  L2-L3:  Small left foraminal protrusion.  No neural impingement.  L3-L4: Small left foraminal disc protrusion. No neural impingement.  L4-L5:  Normal.  L5-S1:  Tiny disc bulge.  No neural impingement.  IMPRESSION: 1. 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 No high-grade spinal canal or neuroforaminal stenosis. No neural impingement.   Electronically Signed   By: Titus Dubin M.D.   On: 05/16/2017 11:07  LUMBAR SPINE - COMPLETE 4+ VIEW  COMPARISON: 08/02/2016  FINDINGS: There are 5 nonrib bearing lumbar-type vertebral bodies.  The vertebral body heights are maintained.  The alignment is anatomic. There is no static listhesis. There is no spondylolysis.  There is no acute fracture.  The disc spaces are maintained.  The SI joints are unremarkable.  IMPRESSION: No focal abnormality of the lumbar spine.   Electronically Signed By: Kathreen Devoid On: 04/07/2017 09:41  She reports that she has never smoked. She has never used smokeless tobacco. No results for input(s): HGBA1C, LABURIC in the last 8760 hours.  Objective:  VS:  HT:    WT:   BMI:     BP:   HR: bpm  TEMP: ( )  RESP:  Physical Exam  Constitutional: She is oriented to person, place, and time. She appears well-developed and well-nourished.  Eyes: Pupils are equal, round, and reactive to light. Conjunctivae and EOM are normal.  Cardiovascular: Normal rate and intact distal pulses.   Pulmonary/Chest: Effort normal.  Musculoskeletal:  Patient ambulates without aid with a fairly  normal gait. She does have some pain with extension of the lumbar spine. She has trigger points in the quadratus lumborum and paraspinal musculature which do reproduce some of her back pain. She has some pain over the greater trochanter. She has no pain with hip rotation she has good distal strength. She has an equivocally positive slump test to the left.  Neurological: She is alert and oriented to person, place, and time. She exhibits normal muscle tone.  Skin: Skin is warm and dry. No rash noted. No erythema.  Psychiatric: She has a normal mood and affect. Her behavior is normal.  Nursing note and vitals reviewed.   Ortho Exam Imaging: No results found.  Past Medical/Family/Surgical/Social History: Medications & Allergies reviewed per EMR Patient Active Problem List   Diagnosis Date Noted  . HTN (hypertension) 05/05/2017  . Elevated lactic acid level 05/05/2017  . Hypothyroidism 02/24/2017  . Mixed hyperlipidemia 02/24/2017  . 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 Sleep related headaches 01/23/2017  . Snoring 01/23/2017  . 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 Chronic headaches 10/30/2016  . Obesity 05/24/2016  . PMB (postmenopausal bleeding) 05/24/2016  . Back pain of lumbar region with sciatica 09/28/2015   Past Medical History:  Diagnosis Date  . Anxiety   .  Depression   . Hypertension   . Migraines    Family History  Problem Relation Age of Onset  . Headache Neg Hx    Past Surgical History:  Procedure Laterality Date  . BREAST CYST EXCISION Right 1980  . BREAST CYST EXCISION Left 1985  . NO PAST SURGERIES     Social History   Occupational History  . Unemployed    Social History Main Topics  . Smoking status: Never Smoker  . Smokeless tobacco: Never Used  . Alcohol use No  . Drug use: No  . Sexual activity: Not Currently    Partners: Male

## 2017-05-21 ENCOUNTER — Encounter (INDEPENDENT_AMBULATORY_CARE_PROVIDER_SITE_OTHER): Payer: Self-pay | Admitting: Physical Medicine and Rehabilitation

## 2017-05-22 ENCOUNTER — Ambulatory Visit
Admission: RE | Admit: 2017-05-22 | Discharge: 2017-05-22 | Disposition: A | Discharge status: Home / Self Care | Payer: Medicaid Other | Source: Ambulatory Visit | Attending: Family Medicine | Admitting: Family Medicine

## 2017-05-22 ENCOUNTER — Other Ambulatory Visit: Payer: Self-pay | Admitting: Family Medicine

## 2017-05-22 DIAGNOSIS — R928 Other abnormal and inconclusive findings on diagnostic imaging of breast: Secondary | ICD-10-CM

## 2017-05-22 DIAGNOSIS — N63 Unspecified lump in unspecified breast: Secondary | ICD-10-CM

## 2017-05-23 ENCOUNTER — Encounter (INDEPENDENT_AMBULATORY_CARE_PROVIDER_SITE_OTHER): Payer: Self-pay | Admitting: Physical Medicine and Rehabilitation

## 2017-05-30 ENCOUNTER — Encounter: Payer: Self-pay | Admitting: Family Medicine

## 2017-05-30 ENCOUNTER — Ambulatory Visit: Payer: Medicaid Other | Attending: Family Medicine | Admitting: Family Medicine

## 2017-05-30 ENCOUNTER — Encounter (INDEPENDENT_AMBULATORY_CARE_PROVIDER_SITE_OTHER): Payer: Self-pay | Admitting: Physical Medicine and Rehabilitation

## 2017-05-30 VITALS — BP 125/86 | HR 68 | Temp 98.7°F | Resp 18 | Ht 67.0 in | Wt 203.0 lb

## 2017-05-30 DIAGNOSIS — Z09 Encounter for follow-up examination after completed treatment for conditions other than malignant neoplasm: Secondary | ICD-10-CM | POA: Diagnosis not present

## 2017-05-30 DIAGNOSIS — Z760 Encounter for issue of repeat prescription: Secondary | ICD-10-CM | POA: Diagnosis not present

## 2017-05-30 DIAGNOSIS — I10 Essential (primary) hypertension: Secondary | ICD-10-CM

## 2017-05-30 MED ORDER — PROPRANOLOL HCL 80 MG PO TABS: 80.0000 mg | ORAL_TABLET | Freq: Two times a day (BID) | ORAL | 2 refills | Status: DC

## 2017-05-30 MED ORDER — AMITRIPTYLINE HCL 25 MG PO TABS: 25.0000 mg | ORAL_TABLET | Freq: Every day | ORAL | 2 refills | Status: DC

## 2017-05-30 MED ORDER — LEVOTHYROXINE SODIUM 50 MCG PO TABS: 50.0000 ug | ORAL_TABLET | Freq: Every day | ORAL | 0 refills | Status: DC

## 2017-05-30 MED ORDER — ATORVASTATIN CALCIUM 20 MG PO TABS: 20.0000 mg | ORAL_TABLET | Freq: Every day | ORAL | 0 refills | Status: DC

## 2017-05-30 MED ORDER — AMLODIPINE BESYLATE 5 MG PO TABS: 5.0000 mg | ORAL_TABLET | Freq: Every day | ORAL | 2 refills | Status: DC

## 2017-05-30 NOTE — Progress Notes (Signed)
Subjective:  Patient ID: [name], female    DOB: 21-May-1961  Age: 56 y.o. MRN: [id number]  CC: Hypertension   HPI [name] presents for hypertension. She is not exercising and is not adherent to low salt diet.  She does not check BP at home. She reports adherence with antihypertensive use.Cardiac symptoms none. Patient denies chest pain, chest pressure/discomfort, claudication, dyspnea, lower extremity edema, near-syncope, palpitations and syncope.  Cardiovascular risk factors: dyslipidemia, hypertension, obesity (BMI >= 30 kg/m2) and sedentary lifestyle. Use of agents associated with hypertension: none. History of target organ damage: none. History of chronic low back pain, which symptoms began 3 years ago. Recent history of ED visit related to fall. The pain is described as aching and occurs intermittently. She reports symptoms improvement since ED visit.  Symptoms are exacerbated by standing and walking. Factors which relieve the pain include NSAIDs, muscle relaxants, and nacortics. Other associated symptoms include parethesias of the lower legs and back spasm.  She reports having upcoming appointment with PT.  Outpatient Medications Prior to Visit  Medication Sig Dispense Refill  . acetaminophen-codeine (TYLENOL #3) 300-30 MG tablet Take 1 tablet by mouth every 8 (eight) hours as needed for severe pain. 40 tablet 0  . baclofen (LIORESAL) 10 MG tablet Take 1 tablet (10 mg total) by mouth every 8 (eight) hours as needed for muscle spasms (Pain). 60 tablet 0  . Cholecalciferol (VITAMIN D) 2000 units tablet Take 2,000 Units by mouth daily.    . divalproex (DEPAKOTE) 500 MG DR tablet Take one tablet in the morning and 2 at night (Patient taking differently: Take 500-1,000 mg by mouth See admin instructions. Take 500mg  in the morning and 1000mg  at night) 90 tablet 6  . escitalopram (LEXAPRO) 10 MG tablet Take 10 mg by mouth at bedtime.  2  . meloxicam (MOBIC) 15 MG tablet Take 1 tablet  (15 mg total) by mouth daily. Take with food 30 tablet 0  . Omega-3 Fatty Acids (FISH OIL) 1000 MG CAPS Take 1,000 mg by mouth 2 (two) times daily.    Marland Kitchen amitriptyline (ELAVIL) 25 MG tablet Take 1 tablet (25 mg total) by mouth at bedtime. 60 tablet 1  . amLODipine (NORVASC) 5 MG tablet Take 1 tablet (5 mg total) by mouth daily. 30 tablet 0  . atorvastatin (LIPITOR) 20 MG tablet Take 1 tablet (20 mg total) by mouth daily. 90 tablet 0  . levothyroxine (SYNTHROID, LEVOTHROID) 50 MCG tablet Take 1 tablet (50 mcg total) by mouth daily. 90 tablet 0  . propranolol (INDERAL) 80 MG tablet Take 1 tablet (80 mg total) by mouth 2 (two) times daily. 60 tablet 2   No facility-administered medications prior to visit.     ROS Review of Systems   Review of Systems  Constitutional: Negative.   HENT: Negative.   Eyes: Negative.   Respiratory: Negative.   Cardiovascular: Negative.   Musculoskeletal: Positive for back pain (chronic).  Skin: Negative.   Neurological: Negative.  Objective:  BP 125/86 (BP Location: Left Arm, Patient Position: Sitting, Cuff Size: Normal)   Pulse 68   Temp 98.7 F (37.1 C) (Oral)   Resp 18   Ht 5\' 7"  (1.702 m)   Wt 203 lb (92.1 kg)   LMP 06/06/2015   SpO2 95%   BMI 31.79 kg/m   BP/Weight 05/30/2017 05/13/2017 7/94/8016  Systolic BP 553 748 270  Diastolic BP 86 89 84  Wt. (Lbs) 203 - 203  BMI 31.79 - 31.79  Physical Exam  Constitutional: She is oriented to person, place, and time. She appears well-developed and well-nourished.  HENT:  Head: Normocephalic and atraumatic.  Right Ear: External ear normal.  Left Ear: External ear normal.  Nose: Nose normal.  Mouth/Throat: Oropharynx is clear and moist.  Eyes: Conjunctivae are normal. Pupils are equal, round, and reactive to light.  Neck: Normal range of motion. Neck supple. No thyromegaly present.  Cardiovascular: Normal rate, regular rhythm, normal heart sounds and intact distal pulses.   Pulmonary/Chest:  Effort normal and breath sounds normal.  Abdominal: Soft. Bowel sounds are normal.  Musculoskeletal:       Lumbar back: She exhibits pain.  Neurological: She is alert and oriented to person, place, and time. She has normal reflexes.  Skin: Skin is warm and dry.  Nursing note and vitals reviewed.   Assessment & Plan:   Problem List Items Addressed This Visit      Cardiovascular and Mediastinum   HTN (hypertension) - Primary   Relevant Medications   amLODipine (NORVASC) 5 MG tablet   propranolol (INDERAL) 80 MG tablet   atorvastatin (LIPITOR) 20 MG tablet    Other Visit Diagnoses    Medication refill       Relevant Medications   levothyroxine (SYNTHROID, LEVOTHROID) 50 MCG tablet   propranolol (INDERAL) 80 MG tablet   amitriptyline (ELAVIL) 25 MG tablet   atorvastatin (LIPITOR) 20 MG tablet   Follow up           Follow up with PT appointment       Reports having medications available to take for back pain    Meds ordered this encounter  Medications  . amLODipine (NORVASC) 5 MG tablet    Sig: Take 1 tablet (5 mg total) by mouth daily.    Dispense:  30 tablet    Refill:  2    Order Specific Question:   Supervising Provider    Answer:   Tresa Garter [id number]  . levothyroxine (SYNTHROID, LEVOTHROID) 50 MCG tablet    Sig: Take 1 tablet (50 mcg total) by mouth daily.    Dispense:  90 tablet    Refill:  0    Order Specific Question:   Supervising Provider    Answer:   Tresa Garter [id number]  . propranolol (INDERAL) 80 MG tablet    Sig: Take 1 tablet (80 mg total) by mouth 2 (two) times daily.    Dispense:  60 tablet    Refill:  2    Order Specific Question:   Supervising Provider    Answer:   Tresa Garter [id number]  . amitriptyline (ELAVIL) 25 MG tablet    Sig: Take 1 tablet (25 mg total) by mouth at bedtime.    Dispense:  30 tablet    Refill:  2    Order Specific Question:   Supervising Provider    Answer:   Tresa Garter [id number]  .  atorvastatin (LIPITOR) 20 MG tablet    Sig: Take 1 tablet (20 mg total) by mouth daily.    Dispense:  90 tablet    Refill:  0    Order Specific Question:   Supervising Provider    Answer:   Tresa Garter [id number]    Follow-up: Return in about 3 months (around 08/30/2017) for HTN.   Alfonse Spruce FNP

## 2017-05-30 NOTE — Patient Instructions (Signed)
DASH Eating Plan DASH stands for "Dietary Approaches to Stop Hypertension." The DASH eating plan is a healthy eating plan that has been shown to reduce high blood pressure (hypertension). It may also reduce your risk for type 2 diabetes, heart disease, and stroke. The DASH eating plan may also help with weight loss. What are tips for following this plan? General guidelines  Avoid eating more than 2,300 mg (milligrams) of salt (sodium) a day. If you have hypertension, you may need to reduce your sodium intake to 1,500 mg a day.  Limit alcohol intake to no more than 1 drink a day for nonpregnant women and 2 drinks a day for men. One drink equals 12 oz of beer, 5 oz of wine, or 1 oz of hard liquor.  Work with your health care provider to maintain a healthy body weight or to lose weight. Ask what an ideal weight is for you.  Get at least 30 minutes of exercise that causes your heart to beat faster (aerobic exercise) most days of the week. Activities may include walking, swimming, or biking.  Work with your health care provider or diet and nutrition specialist (dietitian) to adjust your eating plan to your individual calorie needs. Reading food labels  Check food labels for the amount of sodium per serving. Choose foods with less than 5 percent of the Daily Value of sodium. Generally, foods with less than 300 mg of sodium per serving fit into this eating plan.  To find whole grains, look for the word "whole" as the first word in the ingredient list. Shopping  Buy products labeled as "low-sodium" or "no salt added."  Buy fresh foods. Avoid canned foods and premade or frozen meals. Cooking  Avoid adding salt when cooking. Use salt-free seasonings or herbs instead of table salt or sea salt. Check with your health care provider or pharmacist before using salt substitutes.  Do not fry foods. Cook foods using healthy methods such as baking, boiling, grilling, and broiling instead.  Cook with  heart-healthy oils, such as olive, canola, soybean, or sunflower oil. Meal planning   Eat a balanced diet that includes: ? 5 or more servings of fruits and vegetables each day. At each meal, try to fill half of your plate with fruits and vegetables. ? Up to 6-8 servings of whole grains each day. ? Less than 6 oz of lean meat, poultry, or fish each day. A 3-oz serving of meat is about the same size as a deck of cards. One egg equals 1 oz. ? 2 servings of low-fat dairy each day. ? A serving of nuts, seeds, or beans 5 times each week. ? Heart-healthy fats. Healthy fats called Omega-3 fatty acids are found in foods such as flaxseeds and coldwater fish, like sardines, salmon, and mackerel.  Limit how much you eat of the following: ? Canned or prepackaged foods. ? Food that is high in trans fat, such as fried foods. ? Food that is high in saturated fat, such as fatty meat. ? Sweets, desserts, sugary drinks, and other foods with added sugar. ? Full-fat dairy products.  Do not salt foods before eating.  Try to eat at least 2 vegetarian meals each week.  Eat more home-cooked food and less restaurant, buffet, and fast food.  When eating at a restaurant, ask that your food be prepared with less salt or no salt, if possible. What foods are recommended? The items listed may not be a complete list. Talk with your dietitian about what   dietary choices are best for you. Grains Whole-grain or whole-wheat bread. Whole-grain or whole-wheat pasta. Brown rice. Oatmeal. Quinoa. Bulgur. Whole-grain and low-sodium cereals. Pita bread. Low-fat, low-sodium crackers. Whole-wheat flour tortillas. Vegetables Fresh or frozen vegetables (raw, steamed, roasted, or grilled). Low-sodium or reduced-sodium tomato and vegetable juice. Low-sodium or reduced-sodium tomato sauce and tomato paste. Low-sodium or reduced-sodium canned vegetables. Fruits All fresh, dried, or frozen fruit. Canned fruit in natural juice (without  added sugar). Meat and other protein foods Skinless chicken or turkey. Ground chicken or turkey. Pork with fat trimmed off. Fish and seafood. Egg whites. Dried beans, peas, or lentils. Unsalted nuts, nut butters, and seeds. Unsalted canned beans. Lean cuts of beef with fat trimmed off. Low-sodium, lean deli meat. Dairy Low-fat (1%) or fat-free (skim) milk. Fat-free, low-fat, or reduced-fat cheeses. Nonfat, low-sodium ricotta or cottage cheese. Low-fat or nonfat yogurt. Low-fat, low-sodium cheese. Fats and oils Soft margarine without trans fats. Vegetable oil. Low-fat, reduced-fat, or light mayonnaise and salad dressings (reduced-sodium). Canola, safflower, olive, soybean, and sunflower oils. Avocado. Seasoning and other foods Herbs. Spices. Seasoning mixes without salt. Unsalted popcorn and pretzels. Fat-free sweets. What foods are not recommended? The items listed may not be a complete list. Talk with your dietitian about what dietary choices are best for you. Grains Baked goods made with fat, such as croissants, muffins, or some breads. Dry pasta or rice meal packs. Vegetables Creamed or fried vegetables. Vegetables in a cheese sauce. Regular canned vegetables (not low-sodium or reduced-sodium). Regular canned tomato sauce and paste (not low-sodium or reduced-sodium). Regular tomato and vegetable juice (not low-sodium or reduced-sodium). Pickles. Olives. Fruits Canned fruit in a light or heavy syrup. Fried fruit. Fruit in cream or butter sauce. Meat and other protein foods Fatty cuts of meat. Ribs. Fried meat. Bacon. Sausage. Bologna and other processed lunch meats. Salami. Fatback. Hotdogs. Bratwurst. Salted nuts and seeds. Canned beans with added salt. Canned or smoked fish. Whole eggs or egg yolks. Chicken or turkey with skin. Dairy Whole or 2% milk, cream, and half-and-half. Whole or full-fat cream cheese. Whole-fat or sweetened yogurt. Full-fat cheese. Nondairy creamers. Whipped toppings.  Processed cheese and cheese spreads. Fats and oils Butter. Stick margarine. Lard. Shortening. Ghee. Bacon fat. Tropical oils, such as coconut, palm kernel, or palm oil. Seasoning and other foods Salted popcorn and pretzels. Onion salt, garlic salt, seasoned salt, table salt, and sea salt. Worcestershire sauce. Tartar sauce. Barbecue sauce. Teriyaki sauce. Soy sauce, including reduced-sodium. Steak sauce. Canned and packaged gravies. Fish sauce. Oyster sauce. Cocktail sauce. Horseradish that you find on the shelf. Ketchup. Mustard. Meat flavorings and tenderizers. Bouillon cubes. Hot sauce and Tabasco sauce. Premade or packaged marinades. Premade or packaged taco seasonings. Relishes. Regular salad dressings. Where to find more information:  National Heart, Lung, and Blood Institute: www.nhlbi.nih.gov  American Heart Association: www.heart.org Summary  The DASH eating plan is a healthy eating plan that has been shown to reduce high blood pressure (hypertension). It may also reduce your risk for type 2 diabetes, heart disease, and stroke.  With the DASH eating plan, you should limit salt (sodium) intake to 2,300 mg a day. If you have hypertension, you may need to reduce your sodium intake to 1,500 mg a day.  When on the DASH eating plan, aim to eat more fresh fruits and vegetables, whole grains, lean proteins, low-fat dairy, and heart-healthy fats.  Work with your health care provider or diet and nutrition specialist (dietitian) to adjust your eating plan to your individual   calorie needs. This information is not intended to replace advice given to you by your health care provider. Make sure you discuss any questions you have with your health care provider. Document Released: 09/26/2011 Document Revised: 09/30/2016 Document Reviewed: 09/30/2016 Elsevier Interactive Patient Education  2017 Elsevier Inc.  

## 2017-06-03 ENCOUNTER — Ambulatory Visit: Payer: Medicaid Other | Attending: Physical Medicine and Rehabilitation

## 2017-06-03 DIAGNOSIS — R293 Abnormal posture: Secondary | ICD-10-CM | POA: Insufficient documentation

## 2017-06-03 DIAGNOSIS — M545 Low back pain: Secondary | ICD-10-CM | POA: Diagnosis not present

## 2017-06-03 DIAGNOSIS — G8929 Other chronic pain: Secondary | ICD-10-CM | POA: Diagnosis present

## 2017-06-03 NOTE — Therapy (Signed)
[facility] [place], [place], [address] Phone: [phone]   Fax:  [phone]  Physical Therapy Evaluation/Discharge  Patient Details  Name: [name] MRN: [id number] Date of Birth: December 08, 1960 Referring Provider: Laurence Spates MD  Encounter Date: 06/03/2017      PT End of Session - 06/03/17 1722    Visit Number 1   Number of Visits 1   PT Start Time 0300   PT Stop Time 0345   PT Time Calculation (min) 45 min   Activity Tolerance Patient tolerated treatment well;No increased pain   Behavior During Therapy WFL for tasks assessed/performed      Past Medical History:  Diagnosis Date  . Anxiety   . Depression   . Hypertension   . Migraines     Past Surgical History:  Procedure Laterality Date  . BREAST CYST EXCISION Right 1980  . BREAST CYST EXCISION Left 1985  . NO PAST SURGERIES      There were no vitals filed for this visit.       Subjective Assessment - 06/03/17 1514    Subjective She report onset of back pain without injury. she was having pain with standing and walking.      After fall with flank/rib injury no fracture. Sitting will eae pain   Pertinent History No back pain prior to onset   Limitations Walking;Standing;House hold activities;Sitting  shopping   How long can you sit comfortably? 2 hours   How long can you stand comfortably? 10 min   How long can you walk comfortably? 1/2 mile   Diagnostic tests Xray to back: OA   Currently in Pain? Yes   Pain Score 5    Pain Location Back   Pain Orientation Left;Right;Lower   Pain Descriptors / Indicators --  knife down back and leg   Pain Type Chronic pain   Pain Radiating Towards Lt posterior leg to ankle   Pain Onset More than a month ago   Pain Frequency Intermittent   Aggravating Factors  Activity on feet    Pain Relieving Factors sitting , medications   Multiple Pain Sites No            [facility] PT Assessment - 06/03/17  0001      Assessment   Medical Diagnosis Chronic LBP with sciatica   Referring Provider Laurence Spates MD   Onset Date/Surgical Date --  worse 3-4 months ago    Next MD Visit As needed   Prior Therapy No     Precautions   Precautions None     Restrictions   Weight Bearing Restrictions No     Balance Screen   Has the patient fallen in the past 6 months Yes   How many times? 1  From stool   Has the patient had a decrease in activity level because of a fear of falling?  No   Is the patient reluctant to leave their home because of a fear of falling?  No     Prior Function   Level of Independence Needs assistance with ADLs  assist with shopping   Vocation Unemployed     Cognition   Overall Cognitive Status Within Functional Limits for tasks assessed     Posture/Postural Control   Posture Comments Flexed trunk decr lordosis mild flex at hips rounde shoulders     ROM / Strength   AROM / PROM / Strength AROM;Strength     AROM   AROM Assessment  Site Lumbar   Lumbar Flexion 65   pulled into posterior LT thigh   Lumbar Extension 20  pain in ant quads   Lumbar - Right Side Bend 15   Lumbar - Left Side Bend 12  pain Lt buttock     Strength   Overall Strength Comments 5/5/bilateral LE, poor abdominal control     Flexibility   Soft Tissue Assessment /Muscle Length yes     Palpation   Palpation comment Tender across lower back     Ambulation/Gait   Gait Comments No device shorter steps flexed mild in trunk            Objective measurements completed on examination: See above findings.                  PT Education - 06/03/17 1722    Education provided Yes   Education Details Medicaid limitation  of treatment.     HEP   Person(s) Educated Patient   Methods Explanation;Demonstration;Tactile cues;Verbal cues;Handout   Comprehension Returned demonstration;Verbalized understanding                     Plan - 06/03/17 1723    Clinical  Impression Statement [name] reports onset of pain without injury, She also had a fall injuring Lt ribs in fall about a month ago.   She has LT leg pain with activity on her feet.   Generally flexed or sitting postions ease pain.  She opted to not come to PT as   self pay so exercises were given today   Clinical Presentation Stable   Clinical Decision Making Low   PT Frequency One time visit   PT Treatment/Interventions Patient/family education;Therapeutic exercise   PT Next Visit Plan No follow up  Issued HEP   Consulted and Agree with Plan of Care Patient      Patient will benefit from skilled therapeutic intervention in order to improve the following deficits and impairments:  Pain, Decreased activity tolerance, Postural dysfunction, Decreased strength, Decreased range of motion, Difficulty walking, Increased muscle spasms  Visit Diagnosis: Chronic bilateral low back pain, with sciatica presence unspecified - Plan: PT plan of care cert/re-cert  Abnormal posture - Plan: PT plan of care cert/re-cert     Problem List Patient Active Problem List   Diagnosis Date Noted  . HTN (hypertension) 05/05/2017  . Elevated lactic acid level 05/05/2017  . Hypothyroidism 02/24/2017  . Mixed hyperlipidemia 02/24/2017  . 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 Sleep related headaches 01/23/2017  . Snoring 01/23/2017  . 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 Chronic headaches 10/30/2016  . Obesity 05/24/2016  . PMB (postmenopausal bleeding) 05/24/2016  . Back pain of lumbar region with sciatica 09/28/2015    Darrel Hoover  PT 06/03/2017, 5:38 PM  [facility] [address] [place], [place], [address] Phone: [phone]   Fax:  [phone]  Name: [name] MRN: [id number] Date of Birth: Apr 27, 1961 PHYSICAL THERAPY DISCHARGE SUMMARY  Visits from Start of Care: Eval only  Current functional level related to goals /  functional outcomes: See above.  No follow up as pt. opted to not be self pay    Remaining deficits: See above   Education / Equipment: HEP stretching and core stability  Flexion and  Extension choosing which exercise do not cause leg pain or incr back pain Plan: Patient agrees to discharge.  Patient goals were met. Patient is being discharged  due to financial reasons.  ?????

## 2017-06-03 NOTE — Patient Instructions (Signed)
Issued from cabinet  Extension exercise  But cautioned to stop if leg pain occurs.        Also issued flexion exercises and core strength,, suggested 2x/day 10-20 reps

## 2017-06-30 ENCOUNTER — Encounter: Payer: Self-pay | Admitting: Family Medicine

## 2017-07-02 ENCOUNTER — Other Ambulatory Visit: Payer: Self-pay | Admitting: Family Medicine

## 2017-07-02 NOTE — Telephone Encounter (Signed)
Patient request

## 2017-07-03 ENCOUNTER — Other Ambulatory Visit: Payer: Self-pay | Admitting: Family Medicine

## 2017-07-03 ENCOUNTER — Telehealth: Payer: Self-pay

## 2017-07-03 ENCOUNTER — Encounter: Payer: Self-pay | Admitting: Family Medicine

## 2017-07-03 DIAGNOSIS — E039 Hypothyroidism, unspecified: Secondary | ICD-10-CM

## 2017-07-03 DIAGNOSIS — Z760 Encounter for issue of repeat prescription: Secondary | ICD-10-CM

## 2017-07-03 MED ORDER — LEVOTHYROXINE SODIUM 50 MCG PO TABS: 50.0000 ug | ORAL_TABLET | Freq: Every day | ORAL | 0 refills | Status: DC

## 2017-07-03 NOTE — Telephone Encounter (Signed)
CMA call regarding medication refill being sent to pharmacy   Patient verify DOB   Patient was aware and understood

## 2017-07-03 NOTE — Telephone Encounter (Signed)
Patient concern

## 2017-07-10 ENCOUNTER — Encounter: Payer: Self-pay | Admitting: Family Medicine

## 2017-07-10 ENCOUNTER — Encounter (INDEPENDENT_AMBULATORY_CARE_PROVIDER_SITE_OTHER): Payer: Self-pay | Admitting: Physical Medicine and Rehabilitation

## 2017-07-10 NOTE — Telephone Encounter (Signed)
Patient response

## 2017-07-23 NOTE — Telephone Encounter (Signed)
Patient is replying through mychart. Please resend message as a Pharmacist, community message so patient is able to read and respond.

## 2017-07-24 ENCOUNTER — Telehealth (INDEPENDENT_AMBULATORY_CARE_PROVIDER_SITE_OTHER): Payer: Self-pay | Admitting: Radiology

## 2017-07-24 NOTE — Telephone Encounter (Signed)
VM from son, asking if patient will need to pay copay when she comes to see Dr Ernestina Patches, IC and s/w patient and advised yes.

## 2017-07-30 ENCOUNTER — Encounter (INDEPENDENT_AMBULATORY_CARE_PROVIDER_SITE_OTHER): Payer: Self-pay | Admitting: Physical Medicine and Rehabilitation

## 2017-07-30 ENCOUNTER — Ambulatory Visit (INDEPENDENT_AMBULATORY_CARE_PROVIDER_SITE_OTHER): Payer: Medicaid Other | Admitting: Physical Medicine and Rehabilitation

## 2017-07-30 VITALS — BP 130/84 | HR 76

## 2017-07-30 DIAGNOSIS — F419 Anxiety disorder, unspecified: Secondary | ICD-10-CM

## 2017-07-30 DIAGNOSIS — F32A Depression, unspecified: Secondary | ICD-10-CM

## 2017-07-30 DIAGNOSIS — M5416 Radiculopathy, lumbar region: Secondary | ICD-10-CM | POA: Diagnosis not present

## 2017-07-30 DIAGNOSIS — F329 Major depressive disorder, single episode, unspecified: Secondary | ICD-10-CM | POA: Diagnosis not present

## 2017-07-30 DIAGNOSIS — G8929 Other chronic pain: Secondary | ICD-10-CM

## 2017-07-30 DIAGNOSIS — M5442 Lumbago with sciatica, left side: Secondary | ICD-10-CM | POA: Diagnosis not present

## 2017-07-30 NOTE — Progress Notes (Deleted)
Lower back pain; left and right are equal. Pain radiates to front of left thigh after walking for an extended time. Tried exercise, and the pain seemed to increase.

## 2017-08-04 ENCOUNTER — Encounter (INDEPENDENT_AMBULATORY_CARE_PROVIDER_SITE_OTHER): Payer: Self-pay | Admitting: Physical Medicine and Rehabilitation

## 2017-08-04 NOTE — Progress Notes (Signed)
[name] - 56 y.o. female MRN [id number]  Date of birth: 02-15-1961  Office Visit Note: Visit Date: 07/30/2017 PCP: Alfonse Spruce, FNP Referred by: Orlando Penner*  Subjective: Chief Complaint  Patient presents with  . Lower Back - Pain  . Left Hip - Pain   HPI: [name] is a 56 year old female with chronic history of back pain. We've seen on a couple of occasions and completed MRI of the lumbar spine which is again reviewed below. She's tried anti-inflammatories as well as exercises. She did go to a few sessions of physical therapy although she really is unable to go to a complete course due to being on Medicaid. She reports still pain radiating to the front of the thigh more of an L4-L5 3 distribution. This is on the left side after walking for an extended time. The low back pain is equal left and right. She feels like exercises of the lower back seems to increase her symptoms. The exercises that she shows me her more forward flexion stretching exercises. We did go over exercises with her today and told her not to complete the same exercises. She has not had any new symptoms or focal weakness. No bowel or bladder problems. No trauma. Her history is complicated by significant anxiety and depression.    Review of Systems  Constitutional: Negative for chills, fever, malaise/fatigue and weight loss.  HENT: Negative for hearing loss and sinus pain.   Eyes: Negative for blurred vision, double vision and photophobia.  Respiratory: Negative for cough and shortness of breath.   Cardiovascular: Negative for chest pain, palpitations and leg swelling.  Gastrointestinal: Negative for abdominal pain, nausea and vomiting.  Genitourinary: Negative for flank pain.  Musculoskeletal: Positive for back pain. Negative for myalgias.       Left thigh pain  Skin: Negative for itching and rash.  Neurological: Negative for tremors, focal weakness and weakness.  Endo/Heme/Allergies:  Negative.   Psychiatric/Behavioral: Negative for depression.  All other systems reviewed and are negative.  Otherwise per HPI.  Assessment & Plan: Visit Diagnoses:  1. Lumbar radiculopathy   2. Chronic bilateral low back pain with left-sided sciatica   3. Depression, unspecified depression type   4. Anxiety     Plan: Findings:  Chronic history of mechanical back pain which is likely a combination of myofascial pain and exacerbated by history of anxiety. She does have 2 small foraminal disc protrusions at L3 and L4 and these would give her symptom that she describes in the left thigh even though they're quite small on MRI. She may have some level of myofascial/fibromyalgia board biggest of the same sensation even with a small problem pathologically. I do think at this point she's failed all other conservative care and would be beneficial from a diagnostic standpoint to try left L3 and L4 transforaminal epidural steroid injection. She agrees with this plan. We've talked to her about medications as well as exercises. I did give her examples of exercises today for core strengthening that should not hurt her back. She is going to stop doing the stretches that she was given by physical therapy.    Meds & Orders: No orders of the defined types were placed in this encounter.  No orders of the defined types were placed in this encounter.   Follow-up: Return for Left L3 and L4 transforaminal epidural steroid injection.   Procedures: No procedures performed  No notes on file   Clinical History: MRI LUMBAR SPINE  WITHOUT CONTRAST  TECHNIQUE: Multiplanar, multisequence MR imaging of the lumbar spine was performed. No intravenous contrast was administered.  COMPARISON:  CT abdomen pelvis dated May 05, 2017; lumbar spine x-rays dated April 07, 2017.  FINDINGS: Segmentation:  Standard.  Alignment:  Physiologic.  Vertebrae:  No fracture, evidence of discitis, or bone lesion.  Conus  medullaris: Extends to the L1 level and appears normal.  Paraspinal and other soft tissues: Negative.  Disc levels:  T12-L1:  Normal.  L1-L2:  Normal.  L2-L3:  Small left foraminal protrusion.  No neural impingement.  L3-L4: Small left foraminal disc protrusion. No neural impingement.  L4-L5:  Normal.  L5-S1:  Tiny disc bulge.  No neural impingement.  IMPRESSION: 1. 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 No high-grade spinal canal or neuroforaminal stenosis. No neural impingement.   Electronically Signed   By: Titus Dubin M.D.   On: 05/16/2017 11:07  LUMBAR SPINE - COMPLETE 4+ VIEW  COMPARISON: 08/02/2016  FINDINGS: There are 5 nonrib bearing lumbar-type vertebral bodies.  The vertebral body heights are maintained.  The alignment is anatomic. There is no static listhesis. There is no spondylolysis.  There is no acute fracture.  The disc spaces are maintained.  The SI joints are unremarkable.  IMPRESSION: No focal abnormality of the lumbar spine.   Electronically Signed By: Kathreen Devoid On: 04/07/2017 09:41  She reports that she has never smoked. She has never used smokeless tobacco. No results for input(s): HGBA1C, LABURIC in the last 8760 hours.  Objective:  VS:  HT:    WT:   BMI:     BP:130/84  HR:76bpm  TEMP: ( )  RESP:  Physical Exam  Constitutional: She is oriented to person, place, and time. She appears well-developed and well-nourished. No distress.  HENT:  Head: Normocephalic and atraumatic.  Nose: Nose normal.  Mouth/Throat: Oropharynx is clear and moist.  Eyes: Pupils are equal, round, and reactive to light. Conjunctivae are normal.  Neck: Normal range of motion. Neck supple.  Cardiovascular: Regular rhythm and intact distal pulses.   Pulmonary/Chest: Effort normal. No respiratory distress.  Abdominal: She exhibits no distension. There is no guarding.  Musculoskeletal:    Patient arises from a seated position without too much difficulty. She does have pain with extension of the lumbar spine. She does have tenderness along the paraspinal musculature and PSIS and greater trochanters but no specific pain. She has no pain with hip rotation. She has good distal strength without clonus. She has a negative slump test bilaterally.  Neurological: She is alert and oriented to person, place, and time. She exhibits normal muscle tone. Coordination normal.  Skin: Skin is warm. No rash noted. No erythema.  Psychiatric: She has a normal mood and affect. Her behavior is normal.  Nursing note and vitals reviewed.   Ortho Exam Imaging: No results found.  Past Medical/Family/Surgical/Social History: Medications & Allergies reviewed per EMR Patient Active Problem List   Diagnosis Date Noted  . HTN (hypertension) 05/05/2017  . Elevated lactic acid level 05/05/2017  . Hypothyroidism 02/24/2017  . Mixed hyperlipidemia 02/24/2017  . 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 Sleep related headaches 01/23/2017  . Snoring 01/23/2017  . 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 Chronic headaches 10/30/2016  . Obesity 05/24/2016  . PMB (postmenopausal bleeding) 05/24/2016  . Back pain of lumbar region with sciatica 09/28/2015   Past Medical History:  Diagnosis Date  . Anxiety   . Depression   .  Hypertension   . Migraines    Family History  Problem Relation Age of Onset  . Headache Neg Hx    Past Surgical History:  Procedure Laterality Date  . BREAST CYST EXCISION Right 1980  . BREAST CYST EXCISION Left 1985  . NO PAST SURGERIES     Social History   Occupational History  . Unemployed    Social History Main Topics  . Smoking status: Never Smoker  . Smokeless tobacco: Never Used  . Alcohol use No  . Drug use: No  . Sexual activity: Not Currently    Partners: Male

## 2017-08-09 ENCOUNTER — Encounter: Payer: Self-pay | Admitting: Family Medicine

## 2017-08-10 ENCOUNTER — Encounter: Payer: Self-pay | Admitting: Family Medicine

## 2017-08-11 ENCOUNTER — Ambulatory Visit (INDEPENDENT_AMBULATORY_CARE_PROVIDER_SITE_OTHER): Payer: Medicaid Other

## 2017-08-11 ENCOUNTER — Ambulatory Visit (INDEPENDENT_AMBULATORY_CARE_PROVIDER_SITE_OTHER): Payer: Medicaid Other | Admitting: Physical Medicine and Rehabilitation

## 2017-08-11 ENCOUNTER — Encounter: Payer: Self-pay | Admitting: Family Medicine

## 2017-08-11 VITALS — BP 155/94 | HR 78

## 2017-08-11 DIAGNOSIS — M5442 Lumbago with sciatica, left side: Secondary | ICD-10-CM | POA: Diagnosis not present

## 2017-08-11 DIAGNOSIS — G8929 Other chronic pain: Secondary | ICD-10-CM | POA: Diagnosis not present

## 2017-08-11 DIAGNOSIS — M5116 Intervertebral disc disorders with radiculopathy, lumbar region: Secondary | ICD-10-CM

## 2017-08-11 MED ORDER — LIDOCAINE HCL (PF) 1 % IJ SOLN
2.0000 mL | Freq: Once | INTRAMUSCULAR | Status: AC
  Administered 2017-08-11: 2 mL

## 2017-08-11 MED ORDER — BETAMETHASONE SOD PHOS & ACET 6 (3-3) MG/ML IJ SUSP
12.0000 mg | Freq: Once | INTRAMUSCULAR | Status: AC
  Administered 2017-08-11: 12 mg

## 2017-08-11 NOTE — Progress Notes (Deleted)
Patient was having pain in Left side lower back down to buttock area.

## 2017-08-11 NOTE — Telephone Encounter (Signed)
Refill on thyroid medication being sent to covering provider

## 2017-08-11 NOTE — Patient Instructions (Signed)

## 2017-08-12 NOTE — Procedures (Signed)
[name] is here today for planned left L2 and L3 transforaminal epidural steroid injection for her chronic worsening low back and left hip and leg pain. Please see our prior evaluation and management note for further details and justification.  Lumbosacral Transforaminal Epidural Steroid Injection - Sub-Pedicular Approach with Fluoroscopic Guidance  Patient: [name]      Date of Birth: 08-May-1961 MRN: [id number] PCP: Alfonse Spruce, FNP      Visit Date: 08/11/2017   Universal Protocol:    Date/Time: 08/11/2017  Consent Given By: the patient  Position: PRONE  Additional Comments: Vital signs were monitored before and after the procedure. Patient was prepped and draped in the usual sterile fashion. The correct patient, procedure, and site was verified.   Injection Procedure Details:  Procedure Site One Meds Administered:  Meds ordered this encounter  Medications  . lidocaine (PF) (XYLOCAINE) 1 % injection 2 mL  . betamethasone acetate-betamethasone sodium phosphate (CELESTONE) injection 12 mg    Laterality: Left  Location/Site:  L2-L3 L3-L4  Needle size: 22 G  Needle type: Spinal  Needle Placement: Transforaminal  Findings:  -Contrast Used: 1 mL iohexol 180 mg iodine/mL   -Comments: Excellent flow of contrast along the nerve and into the epidural space.  Procedure Details: After squaring off the end-plates to get a true AP view, the C-arm was positioned so that an oblique view of the foramen as noted above was visualized. The target area is just inferior to the "nose of the scotty dog" or sub pedicular. The soft tissues overlying this structure were infiltrated with 2-3 ml. of 1% Lidocaine without Epinephrine.  The spinal needle was inserted toward the target using a "trajectory" view along the fluoroscope beam.  Under AP and lateral visualization, the needle was advanced so it did not puncture dura and was located close the 6 O'Clock position of the  pedical in AP tracterory. Biplanar projections were used to confirm position. Aspiration was confirmed to be negative for CSF and/or blood. A 1-2 ml. volume of Isovue-250 was injected and flow of contrast was noted at each level. Radiographs were obtained for documentation purposes.   After attaining the desired flow of contrast documented above, a 0.5 to 1.0 ml test dose of 0.25% Marcaine was injected into each respective transforaminal space.  The patient was observed for 90 seconds post injection.  After no sensory deficits were reported, and normal lower extremity motor function was noted,   the above injectate was administered so that equal amounts of the injectate were placed at each foramen (level) into the transforaminal epidural space.   Additional Comments:  The patient tolerated the procedure well Dressing: Band-Aid    Post-procedure details: Patient was observed during the procedure. Post-procedure instructions were reviewed.  Patient left the clinic in stable condition.

## 2017-08-13 ENCOUNTER — Encounter (INDEPENDENT_AMBULATORY_CARE_PROVIDER_SITE_OTHER): Payer: Self-pay | Admitting: Physical Medicine and Rehabilitation

## 2017-08-28 ENCOUNTER — Encounter: Payer: Self-pay | Admitting: Family Medicine

## 2017-08-29 ENCOUNTER — Encounter: Payer: Self-pay | Admitting: Family Medicine

## 2017-09-01 ENCOUNTER — Encounter (INDEPENDENT_AMBULATORY_CARE_PROVIDER_SITE_OTHER): Payer: Self-pay | Admitting: Physical Medicine and Rehabilitation

## 2017-09-02 ENCOUNTER — Ambulatory Visit (INDEPENDENT_AMBULATORY_CARE_PROVIDER_SITE_OTHER): Payer: Medicaid Other | Admitting: Physical Medicine and Rehabilitation

## 2017-09-05 ENCOUNTER — Encounter: Payer: Self-pay | Admitting: Family Medicine

## 2017-09-05 ENCOUNTER — Ambulatory Visit: Payer: Medicaid Other | Attending: Family Medicine | Admitting: Family Medicine

## 2017-09-05 VITALS — BP 129/84 | HR 75 | Temp 98.7°F | Resp 18 | Ht 67.0 in | Wt 212.8 lb

## 2017-09-05 DIAGNOSIS — Z760 Encounter for issue of repeat prescription: Secondary | ICD-10-CM | POA: Diagnosis not present

## 2017-09-05 DIAGNOSIS — Z23 Encounter for immunization: Secondary | ICD-10-CM

## 2017-09-05 DIAGNOSIS — I10 Essential (primary) hypertension: Secondary | ICD-10-CM

## 2017-09-05 DIAGNOSIS — E039 Hypothyroidism, unspecified: Secondary | ICD-10-CM

## 2017-09-05 DIAGNOSIS — Z791 Long term (current) use of non-steroidal anti-inflammatories (NSAID): Secondary | ICD-10-CM | POA: Diagnosis not present

## 2017-09-05 DIAGNOSIS — G8929 Other chronic pain: Secondary | ICD-10-CM

## 2017-09-05 DIAGNOSIS — Z1211 Encounter for screening for malignant neoplasm of colon: Secondary | ICD-10-CM

## 2017-09-05 DIAGNOSIS — M5442 Lumbago with sciatica, left side: Secondary | ICD-10-CM

## 2017-09-05 DIAGNOSIS — E782 Mixed hyperlipidemia: Secondary | ICD-10-CM

## 2017-09-05 DIAGNOSIS — Z79899 Other long term (current) drug therapy: Secondary | ICD-10-CM | POA: Insufficient documentation

## 2017-09-05 MED ORDER — ATORVASTATIN CALCIUM 20 MG PO TABS: 20.0000 mg | ORAL_TABLET | Freq: Every day | ORAL | 0 refills | Status: DC

## 2017-09-05 MED ORDER — BACLOFEN 10 MG PO TABS: 10.0000 mg | ORAL_TABLET | Freq: Three times a day (TID) | ORAL | 0 refills | Status: DC | PRN

## 2017-09-05 MED ORDER — NAPROXEN 500 MG PO TABS: ORAL_TABLET | ORAL | 1 refills | Status: DC

## 2017-09-05 MED ORDER — AMITRIPTYLINE HCL 25 MG PO TABS: 25.0000 mg | ORAL_TABLET | Freq: Every day | ORAL | 5 refills | Status: DC

## 2017-09-05 MED ORDER — PROPRANOLOL HCL 80 MG PO TABS: 80.0000 mg | ORAL_TABLET | Freq: Two times a day (BID) | ORAL | 5 refills | Status: DC

## 2017-09-05 MED ORDER — LEVOTHYROXINE SODIUM 50 MCG PO TABS: 50.0000 ug | ORAL_TABLET | Freq: Every day | ORAL | 0 refills | Status: DC

## 2017-09-05 MED ORDER — AMLODIPINE BESYLATE 5 MG PO TABS: 5.0000 mg | ORAL_TABLET | Freq: Every day | ORAL | 2 refills | Status: DC

## 2017-09-05 NOTE — Patient Instructions (Signed)
Follow up with your orthopedic specialist.   Chronic Back Pain When back pain lasts longer than 3 months, it is called chronic back pain.The cause of your back pain may not be known. Some common causes include:  Wear and tear (degenerative disease) of the bones, ligaments, or disks in your back.  Inflammation and stiffness in your back (arthritis).  People who have chronic back pain often go through certain periods in which the pain is more intense (flare-ups). Many people can learn to manage the pain with home care. Follow these instructions at home: Pay attention to any changes in your symptoms. Take these actions to help with your pain: Activity  Avoid bending and activities that make the problem worse.  Do not sit or stand in one place for long periods of time.  Take brief periods of rest throughout the day. This will reduce your pain. Resting in a lying or standing position is usually better than sitting to rest.  When you are resting for longer periods, mix in some mild activity or stretching between periods of rest. This will help to prevent stiffness and pain.  Get regular exercise. Ask your health care provider what activities are safe for you.  Do not lift anything that is heavier than 10 lb (4.5 kg). Always use proper lifting technique, which includes: ? Bending your knees. ? Keeping the load close to your body. ? Avoiding twisting. Managing pain  If directed, apply ice to the painful area. Your health care provider may recommend applying ice during the first 24-48 hours after a flare-up begins. ? Put ice in a plastic bag. ? Place a towel between your skin and the bag. ? Leave the ice on for 20 minutes, 2-3 times per day.  After icing, apply heat to the affected area as often as told by your health care provider. Use the heat source that your health care provider recommends, such as a moist heat pack or a heating pad. ? Place a towel between your skin and the heat  source. ? Leave the heat on for 20-30 minutes. ? Remove the heat if your skin turns bright red. This is especially important if you are unable to feel pain, heat, or cold. You may have a greater risk of getting burned.  Try soaking in a warm tub.  Take over-the-counter and prescription medicines only as told by your health care provider.  Keep all follow-up visits as told by your health care provider. This is important. Contact a health care provider if:  You have pain that is not relieved with rest or medicine. Get help right away if:  You have weakness or numbness in one or both of your legs or feet.  You have trouble controlling your bladder or your bowels.  You have nausea or vomiting.  You have pain in your abdomen.  You have shortness of breath or you faint. This information is not intended to replace advice given to you by your health care provider. Make sure you discuss any questions you have with your health care provider. Document Released: 11/14/2004 Document Revised: 02/15/2016 Document Reviewed: 03/27/2015 Elsevier Interactive Patient Education  2018 [employer].

## 2017-09-05 NOTE — Progress Notes (Signed)
Subjective:  Patient ID: [name], female    DOB: 1961-02-12  Age: 56 y.o. MRN: [id number]  CC: Follow-up   HPI MALEIA WEEMS presents for hypertension. She is not exercising and is not adherent to low salt diet.  She does not check BP at home. She reports adherence with antihypertensive use.Cardiac symptoms none. Patient denies chest pain, chest pressure/discomfort, claudication, dyspnea, lower extremity edema, near-syncope, palpitations and syncope.  Cardiovascular risk factors: dyslipidemia, hypertension, obesity (BMI >= 30 kg/m2) and sedentary lifestyle. Use of agents associated with hypertension: none. History of target organ damage: none. History of chronic low back pain, which symptoms began 3 years ago. Recent history of ED visit related to fall. The pain is described as aching and occurs intermittently. She reports symptoms improvement since ED visit.  Symptoms are exacerbated by standing and walking. Factors which relieve the pain include NSAIDs, muscle relaxants, and nacortics. Other associated symptoms include parethesias of the lower legs and back spasm. She report seeing an orthopedic specialist for management and received an injection. She reports symptoms improved after injection but has since returned.  Outpatient Medications Prior to Visit  Medication Sig Dispense Refill  . acetaminophen-codeine (TYLENOL #3) 300-30 MG tablet Take 1 tablet by mouth every 8 (eight) hours as needed for severe pain. 40 tablet 0  . Cholecalciferol (VITAMIN D) 2000 units tablet Take 2,000 Units by mouth daily.    . divalproex (DEPAKOTE) 500 MG DR tablet Take one tablet in the morning and 2 at night (Patient taking differently: Take 500-1,000 mg by mouth See admin instructions. Take 500mg  in the morning and 1000mg  at night) 90 tablet 6  . escitalopram (LEXAPRO) 10 MG tablet Take 10 mg by mouth at bedtime.  2  . meloxicam (MOBIC) 15 MG tablet Take 1 tablet (15 mg total) by mouth daily. Take with  food 30 tablet 0  . Omega-3 Fatty Acids (FISH OIL) 1000 MG CAPS Take 1,000 mg by mouth 2 (two) times daily.    Marland Kitchen amitriptyline (ELAVIL) 25 MG tablet Take 1 tablet (25 mg total) by mouth at bedtime. 30 tablet 2  . amLODipine (NORVASC) 5 MG tablet Take 1 tablet (5 mg total) by mouth daily. 30 tablet 2  . atorvastatin (LIPITOR) 20 MG tablet Take 1 tablet (20 mg total) by mouth daily. 90 tablet 0  . baclofen (LIORESAL) 10 MG tablet Take 1 tablet (10 mg total) by mouth every 8 (eight) hours as needed for muscle spasms (Pain). 60 tablet 0  . levothyroxine (SYNTHROID, LEVOTHROID) 50 MCG tablet Take 1 tablet (50 mcg total) by mouth daily. 90 tablet 0  . propranolol (INDERAL) 80 MG tablet Take 1 tablet (80 mg total) by mouth 2 (two) times daily. 60 tablet 2   No facility-administered medications prior to visit.     ROS Review of Systems   Review of Systems  Constitutional: Negative.   HENT: Negative.   Eyes: Negative.   Respiratory: Negative.   Cardiovascular: Negative.   Musculoskeletal: Positive for back pain (chronic).  Skin: Negative.   Neurological: Negative.  Objective:  BP 129/84 (BP Location: Left Arm, Patient Position: Sitting, Cuff Size: Normal)   Pulse 75   Temp 98.7 F (37.1 C) (Oral)   Resp 18   Ht 5\' 7"  (1.702 m)   Wt 212 lb 12.8 oz (96.5 kg)   LMP 06/06/2015   SpO2 98%   BMI 33.33 kg/m   BP/Weight 09/05/2017 08/11/2017 29/93/7169  Systolic BP 678 938 101  Diastolic BP 84 94 84  Wt. (Lbs) 212.8 - -  BMI 33.33 - -     Physical Exam  Constitutional: She is oriented to person, place, and time. She appears well-developed and well-nourished.  HENT:  Head: Normocephalic and atraumatic.   Eyes: Conjunctivae are normal. Pupils are equal, round, and reactive to light.  Neck: Normal range of motion. Neck supple. No thyromegaly present.  Cardiovascular: Normal rate, regular rhythm, normal heart sounds and intact distal pulses.   Pulmonary/Chest: Effort normal and  breath sounds normal.  Abdominal: Soft. Bowel sounds are normal.  Musculoskeletal:       Lumbar back: She exhibits pain.  Neurological: She is alert and oriented to person, place, and time. She has normal reflexes.  Skin: Skin is warm and dry.  Nursing note and vitals reviewed.   Assessment & Plan:   1. Chronic bilateral low back pain with left-sided sciatica Recommend she follow-up with orthopedic specialist as needed. - naproxen (NAPROSYN) 500 MG tablet; TAKE ONE TABLET TWICE A DAY AS NEEDED WITH MEALS.  Dispense: 60 tablet; Refill: 1 - baclofen (LIORESAL) 10 MG tablet; Take 1 tablet (10 mg total) every 8 (eight) hours as needed by mouth for muscle spasms (Pain).  Dispense: 30 tablet; Refill: 0  2. Hypothyroidism, unspecified type  - levothyroxine (SYNTHROID, LEVOTHROID) 50 MCG tablet; Take 1 tablet (50 mcg total) daily by mouth.  Dispense: 90 tablet; Refill: 0  3. Essential hypertension  - propranolol (INDERAL) 80 MG tablet; Take 1 tablet (80 mg total) 2 (two) times daily by mouth.  Dispense: 60 tablet; Refill: 5 - amLODipine (NORVASC) 5 MG tablet; Take 1 tablet (5 mg total) daily by mouth.  Dispense: 30 tablet; Refill: 2  4. Medication refill  - amitriptyline (ELAVIL) 25 MG tablet; Take 1 tablet (25 mg total) at bedtime by mouth.  Dispense: 30 tablet; Refill: 5  5. Mixed hyperlipidemia  - atorvastatin (LIPITOR) 20 MG tablet; Take 1 tablet (20 mg total) daily by mouth.  Dispense: 90 tablet; Refill: 0  6. Screen for colon cancer  - Ambulatory referral to Gastroenterology  7. Needs flu shot  - Flu Vaccine QUAD 6+ mos PF IM (Fluarix Quad PF)   Follow-up: Return in about 1 month (around 10/05/2017) for PAP.   Alfonse Spruce FNP

## 2017-09-05 NOTE — Progress Notes (Signed)
Patient is here for f/up   Patient complains back pain   Patient needs refill on BP med

## 2017-09-07 ENCOUNTER — Encounter (INDEPENDENT_AMBULATORY_CARE_PROVIDER_SITE_OTHER): Payer: Self-pay | Admitting: Physical Medicine and Rehabilitation

## 2017-09-16 ENCOUNTER — Ambulatory Visit (INDEPENDENT_AMBULATORY_CARE_PROVIDER_SITE_OTHER): Payer: Medicaid Other | Admitting: Physical Medicine and Rehabilitation

## 2017-09-16 ENCOUNTER — Encounter (INDEPENDENT_AMBULATORY_CARE_PROVIDER_SITE_OTHER): Payer: Self-pay | Admitting: Physical Medicine and Rehabilitation

## 2017-09-16 VITALS — BP 158/84 | HR 75

## 2017-09-16 DIAGNOSIS — F32A Depression, unspecified: Secondary | ICD-10-CM

## 2017-09-16 DIAGNOSIS — F329 Major depressive disorder, single episode, unspecified: Secondary | ICD-10-CM

## 2017-09-16 DIAGNOSIS — F419 Anxiety disorder, unspecified: Secondary | ICD-10-CM | POA: Diagnosis not present

## 2017-09-16 DIAGNOSIS — M47816 Spondylosis without myelopathy or radiculopathy, lumbar region: Secondary | ICD-10-CM

## 2017-09-16 DIAGNOSIS — G8929 Other chronic pain: Secondary | ICD-10-CM

## 2017-09-16 DIAGNOSIS — M545 Low back pain: Secondary | ICD-10-CM

## 2017-09-16 NOTE — Progress Notes (Deleted)
Left L2 and L3 TF 08/11/17. Injections helped the leg pain, but did not help back pain at all.

## 2017-09-17 ENCOUNTER — Encounter (INDEPENDENT_AMBULATORY_CARE_PROVIDER_SITE_OTHER): Payer: Self-pay | Admitting: Physical Medicine and Rehabilitation

## 2017-09-17 DIAGNOSIS — M47816 Spondylosis without myelopathy or radiculopathy, lumbar region: Secondary | ICD-10-CM | POA: Insufficient documentation

## 2017-09-17 NOTE — Progress Notes (Signed)
[name] - 56 y.o. female MRN [id number]  Date of birth: [birth year]  Office Visit Note: Visit Date: 09/16/2017 PCP: Alfonse Spruce, FNP Referred by: Orlando Penner*  Subjective: Chief Complaint  Patient presents with  . Lower Back - Pain   HPI: [name] is a 56 year old female with chronic back pain and chronic low back pain.  She comes in today with continued complaints of low back pain worse with standing and better at sitting.  She can walk and exercise but then she will seemingly have some symptoms that are worsened after this.  It is still painful to a degree that is limiting what she would like to do.  Her case is complicated by significant anxiety depression and chronic headaches.  She is taking Lexapro as well as amitriptyline.  The amitriptyline is for the headaches.  She did well with epidural injection for 2 small left foraminal disc protrusions in her left thigh pain is now resolved almost completely.  She denies any radicular complaints.  She had no new trauma.  She has had a course of physical therapy which with one session due to limits with her Medicaid.  She continues to try to do exercises and we have talked to her about different core strengthening exercises as well.  She continues with Naprosyn 500 mg twice a day provided by her primary physician.  She is not taking a muscle relaxer.  She has had no focal weakness or leg pain.  She has had no fevers chills or night sweats.    Review of Systems  Constitutional: Negative for chills, fever, malaise/fatigue and weight loss.  HENT: Negative for hearing loss and sinus pain.   Eyes: Negative for blurred vision, double vision and photophobia.  Respiratory: Negative for cough and shortness of breath.   Cardiovascular: Negative for chest pain, palpitations and leg swelling.  Gastrointestinal: Negative for abdominal pain, nausea and vomiting.  Genitourinary: Negative for flank pain.  Musculoskeletal: Positive  for back pain. Negative for myalgias.  Skin: Negative for itching and rash.  Neurological: Positive for headaches. Negative for tingling, tremors, focal weakness and weakness.  Endo/Heme/Allergies: Negative.   Psychiatric/Behavioral: Negative for depression.  All other systems reviewed and are negative.  Otherwise per HPI.  Assessment & Plan: Visit Diagnoses:  1. Spondylosis without myelopathy or radiculopathy, lumbar region   2. Chronic bilateral low back pain without sciatica   3. Depression, unspecified depression type   4. Anxiety     Plan: Findings:  Chronic history of low back pain and worsening low back pain over the last 6 months.  She responds to a degree with anti-inflammatory as well as exercises and is done better than when I first saw her but still with nagging axial low back pain worse with standing.  Feel like this is probably facet mediated pain at the L4-5 level.  We went over the MRI images together today and there is some arthritic change at L4-5 even though the report states that it is normal at that level.  When you compare with the other levels are is actually even some mild level of canal narrowing due to the facet hypertrophy.  And not a very strong L finding on the images but it is there and it does fit with her symptoms.  Her other symptoms are probably myofascial in nature.  I think the best approach would be facet joint block bilaterally at least one time diagnostically.  If this seems to help  quite a bit that we can go down the road of further treatment and appropriate therapy etc.  She should do well otherwise.  Her case again is complicated by depression and anxiety levels which seems to get people focused on their pain levels.    Meds & Orders: No orders of the defined types were placed in this encounter.  No orders of the defined types were placed in this encounter.   Follow-up: Return for Bilateral facet joint block at L4-5.Marland Kitchen   Procedures: No procedures  performed  No notes on file   Clinical History: MRI LUMBAR SPINE WITHOUT CONTRAST  TECHNIQUE: Multiplanar, multisequence MR imaging of the lumbar spine was performed. No intravenous contrast was administered.  COMPARISON:  CT abdomen pelvis dated May 05, 2017; lumbar spine x-rays dated April 07, 2017.  FINDINGS: Segmentation:  Standard.  Alignment:  Physiologic.  Vertebrae:  No fracture, evidence of discitis, or bone lesion.  Conus medullaris: Extends to the L1 level and appears normal.  Paraspinal and other soft tissues: Negative.  Disc levels:  T12-L1:  Normal.  L1-L2:  Normal.  L2-L3:  Small left foraminal protrusion.  No neural impingement.  L3-L4: Small left foraminal disc protrusion. No neural impingement.  L4-L5:  Normal.  L5-S1:  Tiny disc bulge.  No neural impingement.  IMPRESSION: 1. 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 No high-grade spinal canal or neuroforaminal stenosis. No neural impingement.   Electronically Signed   By: Titus Dubin M.D.   On: 05/16/2017 11:07  LUMBAR SPINE - COMPLETE 4+ VIEW  COMPARISON: 08/02/2016  FINDINGS: There are 5 nonrib bearing lumbar-type vertebral bodies.  The vertebral body heights are maintained.  The alignment is anatomic. There is no static listhesis. There is no spondylolysis.  There is no acute fracture.  The disc spaces are maintained.  The SI joints are unremarkable.  IMPRESSION: No focal abnormality of the lumbar spine.   Electronically Signed By: Kathreen Devoid On: 04/07/2017 09:41  She reports that  has never smoked. she has never used smokeless tobacco. No results for input(s): HGBA1C, LABURIC in the last 8760 hours.  Objective:  VS:  HT:    WT:   BMI:     BP:(!) 158/84  HR:75bpm  TEMP: ( )  RESP:  Physical Exam  Constitutional: She is oriented to person, place, and time. She appears well-developed and  well-nourished.  Eyes: Conjunctivae and EOM are normal. Pupils are equal, round, and reactive to light.  Cardiovascular: Normal rate and intact distal pulses.  Pulmonary/Chest: Effort normal.  Musculoskeletal:  Patient ambulates without aid she has good distal strength.  She does have pain with extension of the lumbar spine.  Has no active trigger points but does have taut bands and tenderness in the paraspinal region especially at the quadratus lumborum.  No pain over the greater trochanters.    Neurological: She is alert and oriented to person, place, and time. She exhibits normal muscle tone.  Skin: Skin is warm and dry. No rash noted. No erythema.  Psychiatric: She has a normal mood and affect. Her behavior is normal.  Nursing note and vitals reviewed.   Ortho Exam Imaging: No results found.  Past Medical/Family/Surgical/Social History: Medications & Allergies reviewed per EMR Patient Active Problem List   Diagnosis Date Noted  . Spondylosis without myelopathy or radiculopathy, lumbar region 09/17/2017  . Essential hypertension 09/05/2017  . Chronic bilateral low back pain with left-sided sciatica 09/05/2017  . HTN (hypertension) 05/05/2017  .  Elevated lactic acid level 05/05/2017  . Hypothyroidism 02/24/2017  . Mixed hyperlipidemia 02/24/2017  . 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 Sleep related headaches 01/23/2017  . Snoring 01/23/2017  . 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 Chronic headaches 10/30/2016  . Obesity 05/24/2016  . PMB (postmenopausal bleeding) 05/24/2016  . Back pain of lumbar region with sciatica 09/28/2015   Past Medical History:  Diagnosis Date  . Anxiety   . Depression   . Hypertension   . Migraines    Family History  Problem Relation Age of Onset  . Headache Neg Hx    Past Surgical History:  Procedure Laterality Date  . BREAST CYST EXCISION Right 1980  . BREAST CYST EXCISION Left 1985  . NO PAST SURGERIES     Social History    Occupational History  . Occupation: Unemployed  Tobacco Use  . Smoking status: Never Smoker  . Smokeless tobacco: Never Used  Substance and Sexual Activity  . Alcohol use: No    Alcohol/week: 0.0 oz  . Drug use: No  . Sexual activity: Not Currently    Partners: Male

## 2017-09-22 ENCOUNTER — Ambulatory Visit (INDEPENDENT_AMBULATORY_CARE_PROVIDER_SITE_OTHER): Payer: Medicaid Other

## 2017-09-22 ENCOUNTER — Encounter (INDEPENDENT_AMBULATORY_CARE_PROVIDER_SITE_OTHER): Payer: Self-pay | Admitting: Physical Medicine and Rehabilitation

## 2017-09-22 ENCOUNTER — Ambulatory Visit (INDEPENDENT_AMBULATORY_CARE_PROVIDER_SITE_OTHER): Payer: Medicaid Other | Admitting: Physical Medicine and Rehabilitation

## 2017-09-22 VITALS — BP 151/87 | HR 75 | Temp 98.6°F

## 2017-09-22 DIAGNOSIS — M545 Low back pain: Secondary | ICD-10-CM

## 2017-09-22 DIAGNOSIS — M47816 Spondylosis without myelopathy or radiculopathy, lumbar region: Secondary | ICD-10-CM

## 2017-09-22 DIAGNOSIS — G8929 Other chronic pain: Secondary | ICD-10-CM

## 2017-09-22 MED ORDER — LIDOCAINE HCL (PF) 1 % IJ SOLN
2.0000 mL | Freq: Once | INTRAMUSCULAR | Status: AC
  Administered 2017-09-22: 2 mL

## 2017-09-22 MED ORDER — METHYLPREDNISOLONE ACETATE 80 MG/ML IJ SUSP
80.0000 mg | Freq: Once | INTRAMUSCULAR | Status: AC
  Administered 2017-09-22: 80 mg

## 2017-09-22 NOTE — Patient Instructions (Signed)

## 2017-09-22 NOTE — Progress Notes (Deleted)
She states the her las injection helped her leg but that was all that it did.  + Driver (Cab), - BT's, - Dye Allergy

## 2017-09-22 NOTE — Procedures (Signed)
[name] is a 56 year old female with chronic worsening axial low back pain.  She is here for planned bilateral facet joint blocks at L4-5.  Please see our prior evaluation and management note for further details and justification.  Lumbar Facet Joint Intra-Articular Injection(s) with Fluoroscopic Guidance  Patient: [name]      Date of Birth: 1961-02-17 MRN: [id number] PCP: Alfonse Spruce, FNP      Visit Date: 09/22/2017   Universal Protocol:    Date/Time: 09/22/2017  Consent Given By: the patient  Position: PRONE   Additional Comments: Vital signs were monitored before and after the procedure. Patient was prepped and draped in the usual sterile fashion. The correct patient, procedure, and site was verified.   Injection Procedure Details:  Procedure Site One Meds Administered:  Meds ordered this encounter  Medications  . lidocaine (PF) (XYLOCAINE) 1 % injection 2 mL  . methylPREDNISolone acetate (DEPO-MEDROL) injection 80 mg     Laterality: Bilateral  Location/Site:  L4-L5  Needle size: 22 guage  Needle type: Spinal  Needle Placement: Articular  Findings:  -Contrast Used: 0.5 mL iohexol 180 mg iodine/mL   -Comments: Excellent flow of contrast producing a partial arthrogram.  Procedure Details: The fluoroscope beam is vertically oriented in AP, and the inferior recess is visualized beneath the lower pole of the inferior apophyseal process, which represents the target point for needle insertion. When direct visualization is difficult the target point is located at the medial projection of the vertebral pedicle. The region overlying each aforementioned target is locally anesthetized with a 1 to 2 ml. volume of 1% Lidocaine without Epinephrine.   The spinal needle was inserted into each of the above mentioned facet joints using biplanar fluoroscopic guidance. A 0.25 to 0.5 ml. volume of Isovue-250 was injected and a partial facet joint arthrogram was obtained.  A single spot film was obtained of the resulting arthrogram.    One to 1.25 ml of the steroid/anesthetic solution was then injected into each of the facet joints noted above.   Additional Comments:  The patient tolerated the procedure well Dressing: Band-Aid    Post-procedure details: Patient was observed during the procedure. Post-procedure instructions were reviewed.  Patient left the clinic in stable condition.

## 2017-09-25 ENCOUNTER — Encounter (INDEPENDENT_AMBULATORY_CARE_PROVIDER_SITE_OTHER): Payer: Self-pay | Admitting: Physical Medicine and Rehabilitation

## 2017-09-28 ENCOUNTER — Encounter: Payer: Self-pay | Admitting: Family Medicine

## 2017-09-30 ENCOUNTER — Encounter: Payer: Self-pay | Admitting: Family Medicine

## 2017-10-03 ENCOUNTER — Other Ambulatory Visit: Payer: Medicaid Other | Admitting: Family Medicine

## 2017-10-10 ENCOUNTER — Ambulatory Visit: Payer: Medicaid Other | Attending: Family Medicine | Admitting: Family Medicine

## 2017-10-10 ENCOUNTER — Other Ambulatory Visit (HOSPITAL_COMMUNITY)
Admission: RE | Admit: 2017-10-10 | Discharge: 2017-10-10 | Disposition: A | Discharge status: Home / Self Care | Payer: Medicaid Other | Source: Ambulatory Visit | Attending: Family Medicine | Admitting: Family Medicine

## 2017-10-10 ENCOUNTER — Encounter: Payer: Self-pay | Admitting: Family Medicine

## 2017-10-10 VITALS — BP 125/84 | HR 70 | Temp 98.2°F | Resp 18 | Ht 67.0 in | Wt 212.0 lb

## 2017-10-10 DIAGNOSIS — N898 Other specified noninflammatory disorders of vagina: Secondary | ICD-10-CM | POA: Diagnosis not present

## 2017-10-10 DIAGNOSIS — Z01411 Encounter for gynecological examination (general) (routine) with abnormal findings: Secondary | ICD-10-CM | POA: Diagnosis not present

## 2017-10-10 DIAGNOSIS — R102 Pelvic and perineal pain: Secondary | ICD-10-CM | POA: Diagnosis not present

## 2017-10-10 DIAGNOSIS — Z79899 Other long term (current) drug therapy: Secondary | ICD-10-CM | POA: Diagnosis not present

## 2017-10-10 DIAGNOSIS — Z113 Encounter for screening for infections with a predominantly sexual mode of transmission: Secondary | ICD-10-CM | POA: Insufficient documentation

## 2017-10-10 DIAGNOSIS — Z01419 Encounter for gynecological examination (general) (routine) without abnormal findings: Secondary | ICD-10-CM

## 2017-10-10 DIAGNOSIS — Z760 Encounter for issue of repeat prescription: Secondary | ICD-10-CM | POA: Diagnosis not present

## 2017-10-10 DIAGNOSIS — B372 Candidiasis of skin and nail: Secondary | ICD-10-CM | POA: Diagnosis not present

## 2017-10-10 MED ORDER — FLUCONAZOLE 150 MG PO TABS: 150.0000 mg | ORAL_TABLET | Freq: Once | ORAL | 0 refills | Status: AC

## 2017-10-10 MED ORDER — MICONAZOLE NITRATE 2 % VA CREA: TOPICAL_CREAM | VAGINAL | 0 refills | Status: DC

## 2017-10-10 MED ORDER — ACETAMINOPHEN 325 MG PO TABS: 650.0000 mg | ORAL_TABLET | Freq: Four times a day (QID) | ORAL | Status: DC | PRN

## 2017-10-10 MED ORDER — AMLODIPINE BESYLATE 5 MG PO TABS: 5.0000 mg | ORAL_TABLET | Freq: Every day | ORAL | 2 refills | Status: DC

## 2017-10-10 NOTE — Patient Instructions (Signed)
Breast Self-Awareness Breast self-awareness means:  Knowing how your breasts look.  Knowing how your breasts feel.  Checking your breasts every month for changes.  Telling your doctor if you notice a change in your breasts.  Breast self-awareness allows you to notice a breast problem early while it is still small. How to do a breast self-exam One way to learn what is normal for your breasts and to check for changes is to do a breast self-exam. To do a breast self-exam: Look for Changes  1. Take off all the clothes above your waist. 2. Stand in front of a mirror in a room with good lighting. 3. Put your hands on your hips. 4. Push your hands down. 5. Look at your breasts and nipples in the mirror to see if one breast or nipple looks different than the other. Check to see if: ? The shape of one breast is different. ? The size of one breast is different. ? There are wrinkles, dips, and bumps in one breast and not the other. 6. Look at each breast for changes in your skin, such as: ? Redness. ? Scaly areas. 7. Look for changes in your nipples, such as: ? Liquid around the nipples. ? Bleeding. ? Dimpling. ? Redness. ? A change in where the nipples are. Feel for Changes 1. Lie on your back on the floor. 2. Feel each breast. To do this, follow these steps: ? Pick a breast to feel. ? Put the arm closest to that breast above your head. ? Use your other arm to feel the nipple area of your breast. Feel the area with the pads of your three middle fingers by making small circles with your fingers. For the first circle, press lightly. For the second circle, press harder. For the third circle, press even harder. ? Keep making circles with your fingers at the light, harder, and even harder pressures as you move down your breast. Stop when you feel your ribs. ? Move your fingers a little toward the center of your body. ? Start making circles with your fingers again, this time going up until  you reach your collarbone. ? Keep making up and down circles until you reach your armpit. Remember to keep using the three pressures. ? Feel the other breast in the same way. 3. Sit or stand in the shower or tub. 4. With soapy water on your skin, feel each breast the same way you did in step 2, when you were lying on the floor. Write Down What You Find  After doing the self-exam, write down:  What is normal for each breast.  Any changes you find in each breast.  When you last had your period.  How often should I check my breasts? Check your breasts every month. If you are breastfeeding, the best time to check them is after you feed your baby or after you use a breast pump. If you get periods, the best time to check your breasts is 5-7 days after your period is over. When should I see my doctor? See your doctor if you notice:  A change in shape or size of your breasts or nipples.  A change in the skin of your breast or nipples, such as red or scaly skin.  Unusual fluid coming from your nipples.  A lump or thick area that was not there before.  Pain in your breasts.  Anything that concerns you.  This information is not intended to replace advice given to   you by your health care provider. Make sure you discuss any questions you have with your health care provider. Document Released: 03/25/2008 Document Revised: 03/14/2016 Document Reviewed: 08/27/2015 Elsevier Interactive Patient Education  2018 [employer]. Pap Test Why am I having this test? A pap test is sometimes called a pap smear. It is a screening test that is used to check for signs of cancer of the vagina, cervix, and uterus. The test can also identify the presence of infection or precancerous changes. Your health care provider will likely recommend you have this test done on a regular basis. This test may be done:  Every 3 years, starting at age 89.  Every 5 years, in combination with testing for the presence of  human papillomavirus (HPV).  More or less often depending on other medical conditions.  What kind of sample is taken? Using a small cotton swab, plastic spatula, or brush, your health care provider will collect a sample of cells from the surface of your cervix. Your cervix is the opening to your uterus, also called a womb. Secretions from the cervix and vagina may also be collected. How do I prepare for this test?  Be aware of where you are in your menstrual cycle. You may be asked to reschedule the test if you are menstruating on the day of the test.  You may need to reschedule if you have a known vaginal infection on the day of the test.  You may be asked to avoid douching or taking a bath the day before or the day of the test.  Some medicines can cause abnormal test results, such as digitalis and tetracycline. Talk with your health care provider before your test if you take one of these medicines. What do the results mean? Abnormal test results may indicate a number of health conditions. These may include:  Cancer. Although pap test results cannot be used to diagnose cancer of the cervix, vagina, or uterus, they may suggest the possibility of cancer. Further tests would be required to determine if cancer is present.  Sexually transmitted disease.  Fungal infection.  Parasite infection.  Herpes infection.  A condition causing or contributing to infertility.  It is your responsibility to obtain your test results. Ask the lab or department performing the test when and how you will get your results. Contact your health care provider to discuss any questions you have about your results. Talk with your health care provider to discuss your results, treatment options, and if necessary, the need for more tests. Talk with your health care provider if you have any questions about your results. This information is not intended to replace advice given to you by your health care provider. Make  sure you discuss any questions you have with your health care provider. Document Released: 12/28/2002 Document Revised: 06/12/2016 Document Reviewed: 02/28/2014 Elsevier Interactive Patient Education  [employer].

## 2017-10-10 NOTE — Progress Notes (Signed)
Subjective:  Patient ID: [name], female    DOB: 06/06/1961  Age: 56 y.o. MRN: [id number]  CC: Gynecologic Exam   HPI [name] presents for well woman visit with pap. She reports  family history of breast cancer- maternal aunt. She reports family history of  gynecological cancers. She is not a current smoker.  Recent history of mammogram with upcoming follow-up biopsy February.  She denies any denting or dimpling of the breast, or nipple discharge. She does not perform monthly SBE.  She does report vaginal rash with irritation.  Associated symptoms include pruritus.  She denies discharge, or dysuria. She is agreeable to STI testing with pap.   Outpatient Medications Prior to Visit  Medication Sig Dispense Refill  . amitriptyline (ELAVIL) 25 MG tablet Take 1 tablet (25 mg total) at bedtime by mouth. 30 tablet 5  . atorvastatin (LIPITOR) 20 MG tablet Take 1 tablet (20 mg total) daily by mouth. 90 tablet 0  . Cholecalciferol (VITAMIN D) 2000 units tablet Take 2,000 Units by mouth daily.    . divalproex (DEPAKOTE) 500 MG DR tablet Take one tablet in the morning and 2 at night (Patient taking differently: Take 500-1,000 mg by mouth See admin instructions. Take 500mg  in the morning and 1000mg  at night) 90 tablet 6  . escitalopram (LEXAPRO) 10 MG tablet Take 10 mg by mouth at bedtime.  2  . naproxen (NAPROSYN) 500 MG tablet TAKE ONE TABLET TWICE A DAY AS NEEDED WITH MEALS. 60 tablet 1  . Omega-3 Fatty Acids (FISH OIL) 1000 MG CAPS Take 1,000 mg by mouth 2 (two) times daily.    . propranolol (INDERAL) 80 MG tablet Take 1 tablet (80 mg total) 2 (two) times daily by mouth. 60 tablet 5  . amLODipine (NORVASC) 5 MG tablet Take 1 tablet (5 mg total) daily by mouth. 30 tablet 2  . baclofen (LIORESAL) 10 MG tablet Take 1 tablet (10 mg total) every 8 (eight) hours as needed by mouth for muscle spasms (Pain). 30 tablet 0  . levothyroxine (SYNTHROID, LEVOTHROID) 50 MCG tablet Take 1 tablet (50 mcg  total) daily by mouth. 90 tablet 0   No facility-administered medications prior to visit.     ROS Review of Systems  Constitutional: Negative.   Respiratory: Negative.   Cardiovascular: Negative.   Gastrointestinal: Negative.   Genitourinary: Negative.   Skin: Positive for rash.  Psychiatric/Behavioral: Negative.    Objective:  BP 125/84   Pulse 70   Temp 98.2 F (36.8 C) (Oral)   Resp 18   Ht 5\' 7"  (1.702 m)   Wt 212 lb (96.2 kg)   LMP 06/06/2015   SpO2 97%   BMI 33.20 kg/m   BP/Weight 10/10/2017 09/22/2017 42/68/3419  Systolic BP 622 297 989  Diastolic BP 84 87 84  Wt. (Lbs) 212 - -  BMI 33.2 - -   Physical Exam  Constitutional: She appears well-developed and well-nourished.  Cardiovascular: Normal rate, regular rhythm, normal heart sounds and intact distal pulses.  Pulmonary/Chest: Effort normal and breath sounds normal. Right breast exhibits no mass, no nipple discharge, no skin change and no tenderness. Left breast exhibits no mass, no nipple discharge, no skin change and no tenderness.  Abdominal: Soft. Bowel sounds are normal. There is tenderness (pelvic).  Genitourinary: There is rash (macular w/ erythema to groin, ) on the right labia. There is no lesion on the right labia. There is rash (macular w/ erythema to groin, ) on the  left labia. There is no lesion on the left labia. Cervix exhibits discharge (white).  Psychiatric: Her mood appears anxious.  Nursing note and vitals reviewed.    Depression screen [facility] 2/9 10/10/2017 09/05/2017 05/30/2017  Decreased Interest 1 1 2   Down, Depressed, Hopeless 0 1 0  PHQ - 2 Score 1 2 2   Altered sleeping 2 1 3   Tired, decreased energy 1 1 3   Change in appetite 0 1 0  Feeling bad or failure about yourself  1 1 0  Trouble concentrating 0 1 0  Moving slowly or fidgety/restless 0 1 0  Suicidal thoughts 0 0 0  PHQ-9 Score 5 8 8     GAD 7 : Generalized Anxiety Score 10/10/2017 09/05/2017 05/30/2017 02/10/2017  Nervous,  Anxious, on Edge 0 0 0 1  Control/stop worrying 0 0 1 1  Worry too much - different things 0 0 1 1  Trouble relaxing 0 0 0 0  Restless 0 0 0 0  Easily annoyed or irritable 1 1 1 1   Afraid - awful might happen 0 1 0 1  Total GAD 7 Score 1 2 3 5        Assessment & Plan:   1. Well woman exam with routine gynecological exam  - Cytology - PAP [place] - Cervicovaginal ancillary only  2. Screening examination for STD (sexually transmitted disease)  - HEP, RPR, HIV Panel - HSV(herpes simplex vrs) 1+2 ab-IgG - Cervicovaginal ancillary only  3. Intertriginous candidiasis  - miconazole (MONISTAT 7 SIMPLY CURE) 2 % vaginal cream; Apply to affected external areas twice a day for 7 days.  Dispense: 45 g; Refill: 0  4. Vaginal discharge  - fluconazole (DIFLUCAN) 150 MG tablet; Take 1 tablet (150 mg total) by mouth once for 1 dose.  Dispense: 1 tablet; Refill: 0  5. Pelvic pain  - Cytology - PAP [place] - acetaminophen (TYLENOL) 325 MG tablet; Take 2 tablets (650 mg total) by mouth every 6 (six) hours as needed for moderate pain.  Dispense: 30 tablet - Cervicovaginal ancillary only  6. Medication refill  - amLODipine (NORVASC) 5 MG tablet; Take 1 tablet (5 mg total) by mouth daily.  Dispense: 30 tablet; Refill: 2    Follow-up: Return if symptoms worsen or fail to improve.   Alfonse Spruce FNP

## 2017-10-11 LAB — HEP, RPR, HIV PANEL
HIV Screen 4th Generation wRfx: NONREACTIVE
Hepatitis B Surface Ag: NEGATIVE
RPR Ser Ql: NONREACTIVE

## 2017-10-11 LAB — HSV(HERPES SIMPLEX VRS) I + II AB-IGG
HSV 1 GLYCOPROTEIN G AB, IGG: 48 {index} — AB (ref 0.00–0.90)
HSV 2 IgG, Type Spec: <0.91 index (ref 0.00–0.90)

## 2017-10-14 ENCOUNTER — Encounter: Payer: Self-pay | Admitting: Family Medicine

## 2017-10-14 LAB — CERVICOVAGINAL ANCILLARY ONLY
Bacterial vaginitis: POSITIVE — AB
Candida vaginitis: NEGATIVE
Chlamydia: NEGATIVE
NEISSERIA GONORRHEA: NEGATIVE
TRICH (WINDOWPATH): NEGATIVE

## 2017-10-15 LAB — CYTOLOGY - PAP
ADEQUACY: ABSENT
DIAGNOSIS: NEGATIVE
HPV (WINDOPATH): NOT DETECTED

## 2017-10-16 ENCOUNTER — Other Ambulatory Visit: Payer: Self-pay | Admitting: Family Medicine

## 2017-10-16 ENCOUNTER — Other Ambulatory Visit: Payer: Self-pay | Admitting: *Deleted

## 2017-10-16 DIAGNOSIS — E039 Hypothyroidism, unspecified: Secondary | ICD-10-CM

## 2017-10-16 DIAGNOSIS — N760 Acute vaginitis: Principal | ICD-10-CM

## 2017-10-16 DIAGNOSIS — B9689 Other specified bacterial agents as the cause of diseases classified elsewhere: Secondary | ICD-10-CM

## 2017-10-16 LAB — CERVICOVAGINAL ANCILLARY ONLY: HERPES (WINDOWPATH): NEGATIVE

## 2017-10-16 MED ORDER — LEVOTHYROXINE SODIUM 50 MCG PO TABS: 50.0000 ug | ORAL_TABLET | Freq: Every day | ORAL | 0 refills | Status: DC

## 2017-10-16 MED ORDER — METRONIDAZOLE 500 MG PO TABS: 500.0000 mg | ORAL_TABLET | Freq: Two times a day (BID) | ORAL | 0 refills | Status: DC

## 2017-10-16 NOTE — Telephone Encounter (Signed)
Patient verified DOB Patient is aware of RESULTS through [place].

## 2017-10-16 NOTE — Telephone Encounter (Signed)
-----   Message from Alfonse Spruce, [employer] sent at 10/16/2017  1:18 PM EST ----- Pap smear showed no lesions or malignancy. HIV, Hepatitis B, and syphilis are all negative. Bacterial vaginosis was positive. BV is caused by an overgrowth of germs in the vagina. You will be prescribed metronidazole. To reduce your risk of developing BV don't douche, don't use scented soap or sprays, and use protection during sexual intercourse. Negative cultures for active herpes lesions.  Herpes type 1 which is primarily responsible for cold sores is positive. When you have cold sores do not kiss anyone, share utensils, or have oral sex. Gonorrhea, Chlamydia, Yeast, and Trichomonas were all negative.

## 2017-10-18 ENCOUNTER — Encounter: Payer: Self-pay | Admitting: Family Medicine

## 2017-11-16 ENCOUNTER — Encounter (INDEPENDENT_AMBULATORY_CARE_PROVIDER_SITE_OTHER): Payer: Self-pay | Admitting: Physical Medicine and Rehabilitation

## 2017-11-17 ENCOUNTER — Encounter (INDEPENDENT_AMBULATORY_CARE_PROVIDER_SITE_OTHER): Payer: Self-pay | Admitting: Physical Medicine and Rehabilitation

## 2017-11-25 ENCOUNTER — Other Ambulatory Visit: Payer: Self-pay | Admitting: Family Medicine

## 2017-11-25 ENCOUNTER — Other Ambulatory Visit: Payer: Medicaid Other

## 2017-11-25 ENCOUNTER — Ambulatory Visit
Admission: RE | Admit: 2017-11-25 | Discharge: 2017-11-25 | Disposition: A | Discharge status: Home / Self Care | Payer: Medicaid Other | Source: Ambulatory Visit | Attending: Family Medicine | Admitting: Family Medicine

## 2017-11-25 DIAGNOSIS — N63 Unspecified lump in unspecified breast: Secondary | ICD-10-CM

## 2017-11-25 DIAGNOSIS — N6310 Unspecified lump in the right breast, unspecified quadrant: Secondary | ICD-10-CM

## 2017-11-26 ENCOUNTER — Encounter (INDEPENDENT_AMBULATORY_CARE_PROVIDER_SITE_OTHER): Payer: Self-pay | Admitting: Physical Medicine and Rehabilitation

## 2017-12-02 ENCOUNTER — Ambulatory Visit (INDEPENDENT_AMBULATORY_CARE_PROVIDER_SITE_OTHER): Payer: Medicaid Other | Admitting: Physical Medicine and Rehabilitation

## 2017-12-02 ENCOUNTER — Encounter (INDEPENDENT_AMBULATORY_CARE_PROVIDER_SITE_OTHER): Payer: Self-pay | Admitting: Physical Medicine and Rehabilitation

## 2017-12-02 VITALS — BP 142/89 | HR 72 | Temp 98.3°F

## 2017-12-02 DIAGNOSIS — F32A Depression, unspecified: Secondary | ICD-10-CM

## 2017-12-02 DIAGNOSIS — G894 Chronic pain syndrome: Secondary | ICD-10-CM | POA: Diagnosis not present

## 2017-12-02 DIAGNOSIS — M47816 Spondylosis without myelopathy or radiculopathy, lumbar region: Secondary | ICD-10-CM | POA: Diagnosis not present

## 2017-12-02 DIAGNOSIS — F329 Major depressive disorder, single episode, unspecified: Secondary | ICD-10-CM

## 2017-12-02 DIAGNOSIS — M545 Low back pain: Secondary | ICD-10-CM | POA: Diagnosis not present

## 2017-12-02 DIAGNOSIS — F419 Anxiety disorder, unspecified: Secondary | ICD-10-CM

## 2017-12-02 DIAGNOSIS — M25512 Pain in left shoulder: Secondary | ICD-10-CM | POA: Diagnosis not present

## 2017-12-02 DIAGNOSIS — G8929 Other chronic pain: Secondary | ICD-10-CM

## 2017-12-02 MED ORDER — MELOXICAM 15 MG PO TABS: 15.0000 mg | ORAL_TABLET | Freq: Every day | ORAL | 0 refills | Status: DC

## 2017-12-02 MED ORDER — METHOCARBAMOL 500 MG PO TABS: 500.0000 mg | ORAL_TABLET | Freq: Three times a day (TID) | ORAL | 0 refills | Status: DC | PRN

## 2017-12-02 NOTE — Progress Notes (Signed)
[name] - 57 y.o. female MRN [id number]  Date of birth: 1961-08-28  Office Visit Note: Visit Date: 12/02/2017 PCP: Alfonse Spruce, FNP Referred by: Orlando Penner*  Subjective: Chief Complaint  Patient presents with  . Lower Back - Pain  . Right Leg - Pain  . Left Leg - Pain  . Left Shoulder - Pain   HPI: [name] is a 57 year old female that we have seen on a few occasions now for chronic history of back pain and some radicular type thigh pain on the right.  We have completed epidural injection that seemed to relieve the right thigh symptoms pretty significantly and she is really not having much in the way of that now.  She has had off and on mechanical back pain.  She was not able to really complete physical therapy due to [facility] insurance.  MRI findings are fairly sparse but she does have some degenerative changes of the lower facet joints and a small bulge at L5-S1 again with these left foraminal protrusions at L2 and L3.  She was doing fairly well up until recently.  She reports at the beginning of January of this year she began having increasing low back pain that she describes as a throbbing pain.  It does limit her ability to stand and walk for long distances at this point.  She felt like it was much better after the facet joint injection that we performed last year.  He feels like the medication wore off.  He denies any radicular pain down the legs.  She does get the legs at times but does not necessarily relate those to her back.  She does report decreased symptoms while sitting.  She does use the baclofen muscle relaxer but it seems to make her sleepy so she can only use it at certain times.  She has been using Naprosyn as needed.  She denies any bowel or bladder changes or focal weakness or night pain.  She also asked me today about rotator cuff problems and left shoulder pain.  She reports chronic worsening of her left shoulder she reports a few weeks of  significant worsening of the left shoulder with pain with rotation and movement.  She denies any radicular pain in the arm or paresthesias.  No specific neck pain.  No right-sided shoulder pain.  She is right-hand dominant.    Review of Systems  Constitutional: Negative for chills, fever, malaise/fatigue and weight loss.  HENT: Negative for hearing loss and sinus pain.   Eyes: Negative for blurred vision, double vision and photophobia.  Respiratory: Negative for cough and shortness of breath.   Cardiovascular: Negative for chest pain, palpitations and leg swelling.  Gastrointestinal: Negative for abdominal pain, nausea and vomiting.  Genitourinary: Negative for flank pain.  Musculoskeletal: Positive for back pain and joint pain. Negative for myalgias.  Skin: Negative for itching and rash.  Neurological: Negative for tremors, focal weakness and weakness.  Endo/Heme/Allergies: Negative.   Psychiatric/Behavioral: Negative for depression.  All other systems reviewed and are negative.  Otherwise per HPI.  Assessment & Plan: Visit Diagnoses:  1. Spondylosis without myelopathy or radiculopathy, lumbar region   2. Chronic bilateral low back pain without sciatica   3. Depression, unspecified depression type   4. Anxiety   5. Chronic pain syndrome   6. Acute pain of left shoulder     Plan: Findings:  1.  Chronic history of mainly mechanical back pain but some findings of minimal  degenerative early changes of the lower lumbar spine.  She has had prior injection for small foraminal protrusions on the right with consistent thigh pain.  Those did seem to help.  Facet joint block was also very beneficial for her back pain.  Since January she has had significant pain not throbbing pain of the lower back.  Exam does not show any paraspinal spasm.  She is tight across there was some tender points.  She has some tender points over the greater trochanters.  In terms of her back pain at this point I would  like to stay more conservative and get her on a course of anti-inflammatory at least for the next few weeks.  Prescribed meloxicam.  She is not to take the Naprosyn or ibuprofen during this time.  I also want her to take Tylenol extra strength 1 capsule 3 times a day with breakfast lunch and dinner.  I also gave her a set of exercises to look at for core strengthening.  Depending on relief with that we could look at medial branch blocks and radiofrequency ablation.  2.  In terms of her left shoulder she does have restricted range of motion with external rotation more than internal rotation.  She does have some impingement signs.  She has a negative drop arm test.  We did provide a subacromial injection today which during the anesthetic phase did seem to help her pain she still had limited range of motion with external rotation.  Depending on where this heads I would probably have her see 1 of my orthopedic partners for further evaluation of her shoulder.  I will see her back in 4 weeks to see how she is doing.    Meds & Orders:  Meds ordered this encounter  Medications  . meloxicam (MOBIC) 15 MG tablet    Sig: Take 1 tablet (15 mg total) by mouth daily. Take with food    Dispense:  30 tablet    Refill:  0  . methocarbamol (ROBAXIN) 500 MG tablet    Sig: Take 1 tablet (500 mg total) by mouth every 8 (eight) hours as needed for muscle spasms.    Dispense:  60 tablet    Refill:  0    Orders Placed This Encounter  Procedures  . Large Joint Inj: L subacromial bursa    Follow-up: Return in about 4 weeks (around 12/30/2017).   Procedures: Large Joint Inj: L subacromial bursa on 12/02/2017 11:57 AM Indications: pain and diagnostic evaluation Details: 25 G 1.5 in needle, posterior approach  Arthrogram: No  Medications: 40 mg triamcinolone acetonide 40 MG/ML; 4 mL bupivacaine 0.25 % Outcome: tolerated well, no immediate complications  Injectate delivered freely without restriction through the  normal position. Patient seemed to have relief during the anesthetic phase.  Procedure, treatment alternatives, risks and benefits explained, specific risks discussed. Consent was given by the patient. Immediately prior to procedure a time out was called to verify the correct patient, procedure, equipment, support staff and site/side marked as required. Patient was prepped and draped in the usual sterile fashion.      No notes on file   Clinical History: MRI LUMBAR SPINE WITHOUT CONTRAST  TECHNIQUE: Multiplanar, multisequence MR imaging of the lumbar spine was performed. No intravenous contrast was administered.  COMPARISON:  CT abdomen pelvis dated May 05, 2017; lumbar spine x-rays dated April 07, 2017.  FINDINGS: Segmentation:  Standard.  Alignment:  Physiologic.  Vertebrae:  No fracture, evidence of discitis, or bone lesion.  Conus medullaris: Extends to the L1 level and appears normal.  Paraspinal and other soft tissues: Negative.  Disc levels:  T12-L1:  Normal.  L1-L2:  Normal.  L2-L3:  Small left foraminal protrusion.  No neural impingement.  L3-L4: Small left foraminal disc protrusion. No neural impingement.  L4-L5:  Normal.  L5-S1:  Tiny disc bulge.  No neural impingement.  IMPRESSION: 1. 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 No high-grade spinal canal or neuroforaminal stenosis. No neural impingement.   Electronically Signed   By: Titus Dubin M.D.   On: 05/16/2017 11:07  LUMBAR SPINE - COMPLETE 4+ VIEW  COMPARISON: 08/02/2016  FINDINGS: There are 5 nonrib bearing lumbar-type vertebral bodies.  The vertebral body heights are maintained.  The alignment is anatomic. There is no static listhesis. There is no spondylolysis.  There is no acute fracture.  The disc spaces are maintained.  The SI joints are unremarkable.  IMPRESSION: No focal abnormality of the lumbar  spine.   Electronically Signed By: Kathreen Devoid On: 04/07/2017 09:41  She reports that  has never smoked. she has never used smokeless tobacco. No results for input(s): HGBA1C, LABURIC in the last 8760 hours.  Objective:  VS:  HT:    WT:   BMI:     BP:(!) 142/89  HR:72bpm  TEMP:98.3 F (36.8 C)(Oral)  RESP:98 % Physical Exam  Constitutional: She is oriented to person, place, and time. She appears well-developed and well-nourished. No distress.  HENT:  Head: Normocephalic and atraumatic.  Nose: Nose normal.  Mouth/Throat: Oropharynx is clear and moist.  Eyes: Conjunctivae are normal. Pupils are equal, round, and reactive to light.  Neck: Normal range of motion. Neck supple.  Cardiovascular: Regular rhythm and intact distal pulses.  Pulmonary/Chest: Effort normal. No respiratory distress.  Abdominal: She exhibits no distension. There is no guarding.  Musculoskeletal:  Patient is slow to rise from a seated position and does have pain with extension of the lumbar spine.  She does have paraspinal tenderness is essentially tender points across the lower back PSIS and greater trochanters.  She does not have any frank paraspinal spasm.  She has no pain with hip rotation has good distal strength.  Examination of the left shoulder shows restricted movement with external rotation and pain.  No pain over the [facility] joint.  He has not real pain with internal rotation.  He does lack some range of motion with extension.  She has good strength in the upper extremities.  Neurological: She is alert and oriented to person, place, and time. She exhibits normal muscle tone. Coordination normal.  Skin: Skin is warm. No rash noted. No erythema.  Psychiatric: She has a normal mood and affect. Her behavior is normal.  Nursing note and vitals reviewed.   Ortho Exam Imaging: No results found.  Past Medical/Family/Surgical/Social History: Medications & Allergies reviewed per EMR Patient Active Problem List    Diagnosis Date Noted  . Spondylosis without myelopathy or radiculopathy, lumbar region 09/17/2017  . Essential hypertension 09/05/2017  . Chronic bilateral low back pain with left-sided sciatica 09/05/2017  . HTN (hypertension) 05/05/2017  . Elevated lactic acid level 05/05/2017  . Hypothyroidism 02/24/2017  . Mixed hyperlipidemia 02/24/2017  . 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 Sleep related headaches 01/23/2017  . Snoring 01/23/2017  . 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 Chronic headaches 10/30/2016  . Obesity 05/24/2016  . PMB (postmenopausal bleeding) 05/24/2016  . Back pain of lumbar region with sciatica  09/28/2015   Past Medical History:  Diagnosis Date  . Anxiety   . Depression   . Hypertension   . Migraines    Family History  Problem Relation Age of Onset  . Headache Neg Hx    Past Surgical History:  Procedure Laterality Date  . BREAST CYST EXCISION Right 1980  . BREAST CYST EXCISION Left 1985  . NO PAST SURGERIES     Social History   Occupational History  . Occupation: Unemployed  Tobacco Use  . Smoking status: Never Smoker  . Smokeless tobacco: Never Used  Substance and Sexual Activity  . Alcohol use: No    Alcohol/week: 0.0 oz  . Drug use: No  . Sexual activity: Not Currently    Partners: Male

## 2017-12-02 NOTE — Patient Instructions (Signed)

## 2017-12-02 NOTE — Progress Notes (Deleted)
Pt states a constant throbbing pain in lower back. Pt states at times pain in both left and right leg. Pt states pain has been there since the beginning of January 2019. Pt states standing or walking for a long period of time makes the pain. Pt states sitting down for a little while makes pain better.

## 2017-12-03 ENCOUNTER — Encounter (INDEPENDENT_AMBULATORY_CARE_PROVIDER_SITE_OTHER): Payer: Self-pay | Admitting: Physical Medicine and Rehabilitation

## 2017-12-03 MED ORDER — TRIAMCINOLONE ACETONIDE 40 MG/ML IJ SUSP
40.0000 mg | INTRAMUSCULAR | Status: AC | PRN
  Administered 2017-12-02: 40 mg via INTRA_ARTICULAR

## 2017-12-03 MED ORDER — BUPIVACAINE HCL 0.25 % IJ SOLN
4.0000 mL | INTRAMUSCULAR | Status: AC | PRN
  Administered 2017-12-02: 4 mL via INTRA_ARTICULAR

## 2017-12-31 ENCOUNTER — Ambulatory Visit (INDEPENDENT_AMBULATORY_CARE_PROVIDER_SITE_OTHER): Payer: Medicaid Other | Admitting: Physical Medicine and Rehabilitation

## 2017-12-31 ENCOUNTER — Encounter (INDEPENDENT_AMBULATORY_CARE_PROVIDER_SITE_OTHER): Payer: Self-pay | Admitting: Physical Medicine and Rehabilitation

## 2017-12-31 VITALS — BP 149/90 | HR 66 | Temp 98.0°F

## 2017-12-31 DIAGNOSIS — G8929 Other chronic pain: Secondary | ICD-10-CM

## 2017-12-31 DIAGNOSIS — G894 Chronic pain syndrome: Secondary | ICD-10-CM

## 2017-12-31 DIAGNOSIS — M47816 Spondylosis without myelopathy or radiculopathy, lumbar region: Secondary | ICD-10-CM | POA: Diagnosis not present

## 2017-12-31 DIAGNOSIS — M545 Low back pain: Secondary | ICD-10-CM | POA: Diagnosis not present

## 2017-12-31 NOTE — Progress Notes (Signed)
 .  Numeric Pain Rating Scale and Functional Assessment Average Pain 8 Pain Right Now 2 My pain is stabbing Pain is worse with: walking and standing Pain improves with: rest   In the last MONTH (on 0-10 scale) has pain interfered with the following?  1. General activity like being  able to carry out your everyday physical activities such as walking, climbing stairs, carrying groceries, or moving a chair?  Rating(5)  2. Relation with others like being able to carry out your usual social activities and roles such as  activities at home, at work and in your community. Rating(2)  3. Enjoyment of life such that you have  been bothered by emotional problems such as feeling anxious, depressed or irritable?  Rating(4)

## 2018-01-15 ENCOUNTER — Encounter (INDEPENDENT_AMBULATORY_CARE_PROVIDER_SITE_OTHER): Payer: Self-pay | Admitting: Physical Medicine and Rehabilitation

## 2018-01-15 NOTE — Progress Notes (Signed)
[name] - 57 y.o. female MRN [id number]  Date of birth: 07-05-1961  Office Visit Note: Visit Date: 12/31/2017 PCP: Alfonse Spruce, FNP Referred by: Orlando Penner*  Subjective: Chief Complaint  Patient presents with  . Middle Back - Pain  . Left Leg - Pain   HPI: [name] is a pleasant 57 year old female who comes in today with worsening low back pain across the lower back bilaterally.  She had this before and did respond to intra-articular facet joint blocks quite nicely.  She has been taking some anti-inflammatory that we have given her and that seems to help.  She has had a little bit of issue taking the muscle relaxers but they do help to a degree.  She reports worsening symptoms really since February of this year although she has had it off and on for quite some time.  She does have a diagnosis of anxiety and depression which I do think flavors the back pain to some degree.  She has mainly mechanical pain complaints of her lower back.  MRI evidence of the lower spine shows some arthritic changes of L4-5 in particular.  She has had no radicular complaints.  She has had no weakness or trauma.  She has had no prior back surgery.  She has had no red flag complaints.   Review of Systems  Constitutional: Negative for chills, fever, malaise/fatigue and weight loss.  HENT: Negative for hearing loss and sinus pain.   Eyes: Negative for blurred vision, double vision and photophobia.  Respiratory: Negative for cough and shortness of breath.   Cardiovascular: Negative for chest pain, palpitations and leg swelling.  Gastrointestinal: Negative for abdominal pain, nausea and vomiting.  Genitourinary: Negative for flank pain.  Musculoskeletal: Positive for back pain and joint pain. Negative for myalgias.  Skin: Negative for itching and rash.  Neurological: Negative for tremors, focal weakness and weakness.  Endo/Heme/Allergies: Negative.   Psychiatric/Behavioral: Negative  for depression.  All other systems reviewed and are negative.  Otherwise per HPI.  Assessment & Plan: Visit Diagnoses:  1. Spondylosis without myelopathy or radiculopathy, lumbar region   2. Chronic bilateral low back pain without sciatica   3. Chronic pain syndrome     Plan: Findings:  Chronic worsening severe at times and somewhat functionally limiting mechanical axial low back pain.  This is worse with standing and extension consistent with facet mediated back pain.  MRI evidence shows mild to moderate arthritis.  There is no focal nerve compression.  She has no radicular complaints.  She is tried medication management and has done well with a prior intra-articular injection.  I think given all those findings it seems clear this is probably facet mediated pain exacerbated by some anxiety issues.  The next step would be diagnostic medial branch blocks of the L4-5 facet joint and a double block paradigm with a pain diary.  If she does well with that we would look at radiofrequency ablation.    Meds & Orders: No orders of the defined types were placed in this encounter.  No orders of the defined types were placed in this encounter.   Follow-up: Return for Medial branch blocks.   Procedures: No procedures performed  No notes on file   Clinical History: MRI LUMBAR SPINE WITHOUT CONTRAST  TECHNIQUE: Multiplanar, multisequence MR imaging of the lumbar spine was performed. No intravenous contrast was administered.  COMPARISON:  CT abdomen pelvis dated May 05, 2017; lumbar spine x-rays dated April 07, 2017.  FINDINGS: Segmentation:  Standard.  Alignment:  Physiologic.  Vertebrae:  No fracture, evidence of discitis, or bone lesion.  Conus medullaris: Extends to the L1 level and appears normal.  Paraspinal and other soft tissues: Negative.  Disc levels:  T12-L1:  Normal.  L1-L2:  Normal.  L2-L3:  Small left foraminal protrusion.  No neural impingement.  L3-L4:  Small left foraminal disc protrusion. No neural impingement.  L4-L5:  Normal.  L5-S1:  Tiny disc bulge.  No neural impingement.  IMPRESSION: 1. 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 No high-grade spinal canal or neuroforaminal stenosis. No neural impingement.   Electronically Signed   By: Titus Dubin M.D.   On: 05/16/2017 11:07  LUMBAR SPINE - COMPLETE 4+ VIEW  COMPARISON: 08/02/2016  FINDINGS: There are 5 nonrib bearing lumbar-type vertebral bodies.  The vertebral body heights are maintained.  The alignment is anatomic. There is no static listhesis. There is no spondylolysis.  There is no acute fracture.  The disc spaces are maintained.  The SI joints are unremarkable.  IMPRESSION: No focal abnormality of the lumbar spine.   Electronically Signed By: Kathreen Devoid On: 04/07/2017 09:41   She reports that she has never smoked. She has never used smokeless tobacco. No results for input(s): HGBA1C, LABURIC in the last 8760 hours.  Objective:  VS:  HT:    WT:   BMI:     BP:(!) 149/90  HR:66bpm  TEMP:98 F (36.7 C)(Oral)  RESP:97 % Physical Exam  Constitutional: She is oriented to person, place, and time. She appears well-developed and well-nourished.  Eyes: Pupils are equal, round, and reactive to light. Conjunctivae and EOM are normal.  Cardiovascular: Normal rate and intact distal pulses.  Pulmonary/Chest: Effort normal.  Musculoskeletal:  Patient ambulates without aid.  She has some pain going from sit to stand and concordant pain with extension rotation bilaterally.  She is somewhat tender to palpation along the paraspinal muscles and greater trochanters.  She has good distal strength.  Neurological: She is alert and oriented to person, place, and time. She exhibits normal muscle tone. Coordination normal.  Skin: Skin is warm and dry. No rash noted. No erythema.  Psychiatric: She has a normal mood and  affect. Her behavior is normal.  Nursing note and vitals reviewed.   Ortho Exam Imaging: No results found.  Past Medical/Family/Surgical/Social History: Medications & Allergies reviewed per EMR, new medications updated. Patient Active Problem List   Diagnosis Date Noted  . Spondylosis without myelopathy or radiculopathy, lumbar region 09/17/2017  . Essential hypertension 09/05/2017  . Chronic bilateral low back pain with left-sided sciatica 09/05/2017  . HTN (hypertension) 05/05/2017  . Elevated lactic acid level 05/05/2017  . Hypothyroidism 02/24/2017  . Mixed hyperlipidemia 02/24/2017  . 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 Sleep related headaches 01/23/2017  . Snoring 01/23/2017  . 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 Chronic headaches 10/30/2016  . Obesity 05/24/2016  . PMB (postmenopausal bleeding) 05/24/2016  . Back pain of lumbar region with sciatica 09/28/2015   Past Medical History:  Diagnosis Date  . Anxiety   . Depression   . Hypertension   . Migraines    Family History  Problem Relation Age of Onset  . Headache Neg Hx    Past Surgical History:  Procedure Laterality Date  . BREAST CYST EXCISION Right 1980  . BREAST CYST EXCISION Left 1985  . NO PAST SURGERIES     Social History  Occupational History  . Occupation: Unemployed  Tobacco Use  . Smoking status: Never Smoker  . Smokeless tobacco: Never Used  Substance and Sexual Activity  . Alcohol use: No    Alcohol/week: 0.0 oz  . Drug use: No  . Sexual activity: Not Currently    Partners: Male

## 2018-01-21 ENCOUNTER — Ambulatory Visit (INDEPENDENT_AMBULATORY_CARE_PROVIDER_SITE_OTHER): Payer: Medicaid Other

## 2018-01-21 ENCOUNTER — Ambulatory Visit (INDEPENDENT_AMBULATORY_CARE_PROVIDER_SITE_OTHER): Payer: Medicaid Other | Admitting: Physical Medicine and Rehabilitation

## 2018-01-21 ENCOUNTER — Encounter (INDEPENDENT_AMBULATORY_CARE_PROVIDER_SITE_OTHER): Payer: Self-pay | Admitting: Physical Medicine and Rehabilitation

## 2018-01-21 VITALS — BP 145/77 | HR 88 | Temp 98.4°F

## 2018-01-21 DIAGNOSIS — M47816 Spondylosis without myelopathy or radiculopathy, lumbar region: Secondary | ICD-10-CM | POA: Diagnosis not present

## 2018-01-21 MED ORDER — BUPIVACAINE HCL 0.5 % IJ SOLN
3.0000 mL | Freq: Once | INTRAMUSCULAR | Status: AC
  Administered 2018-01-21: 3 mL

## 2018-01-21 NOTE — Patient Instructions (Signed)

## 2018-01-21 NOTE — Progress Notes (Signed)
 .  Numeric Pain Rating Scale and Functional Assessment Average Pain 8   In the last MONTH (on 0-10 scale) has pain interfered with the following?  1. General activity like being  able to carry out your everyday physical activities such as walking, climbing stairs, carrying groceries, or moving a chair?  Rating(5)   +Driver, -BT, -Dye Allergies.  

## 2018-01-23 ENCOUNTER — Encounter (INDEPENDENT_AMBULATORY_CARE_PROVIDER_SITE_OTHER): Payer: Self-pay | Admitting: Physical Medicine and Rehabilitation

## 2018-01-24 ENCOUNTER — Encounter (INDEPENDENT_AMBULATORY_CARE_PROVIDER_SITE_OTHER): Payer: Self-pay | Admitting: Physical Medicine and Rehabilitation

## 2018-01-27 ENCOUNTER — Encounter (INDEPENDENT_AMBULATORY_CARE_PROVIDER_SITE_OTHER): Payer: Self-pay | Admitting: Physical Medicine and Rehabilitation

## 2018-01-28 NOTE — Procedures (Signed)
Lumbar Diagnostic Facet Joint Nerve Block with Fluoroscopic Guidance   Patient: [name]      Date of Birth: 1961/04/21 MRN: [id number] PCP: Alfonse Spruce, FNP      Visit Date: 01/21/2018   Universal Protocol:    Date/Time: 04/10/195:35 AM  Consent Given By: the patient  Position: PRONE  Additional Comments: Vital signs were monitored before and after the procedure. Patient was prepped and draped in the usual sterile fashion. The correct patient, procedure, and site was verified.   Injection Procedure Details:  Procedure Site One Meds Administered:  Meds ordered this encounter  Medications  . bupivacaine (MARCAINE) 0.5 % (with pres) injection 3 mL     Laterality: Bilateral  Location/Site: Bilateral L3 and L4 medial branches L4-L5  Needle size: 22 ga.  Needle type:spinal  Needle Placement: Oblique pedical  Findings:   -Comments: There was excellent flow of contrast along the articular pillars without intravascular flow.  Procedure Details: The fluoroscope beam is vertically oriented in AP and then obliqued 15 to 20 degrees to the ipsilateral side of the desired nerve to achieve the "Scotty dog" appearance.  The skin over the target area of the junction of the superior articulating process and the transverse process (sacral ala if blocking the L5 dorsal rami) was locally anesthetized with a 1 ml volume of 1% Lidocaine without Epinephrine.  The spinal needle was inserted and advanced in a trajectory view down to the target.   After contact with periosteum and negative aspirate for blood and CSF, correct placement without intravascular or epidural spread was confirmed by injecting 0.5 ml. of Isovue-250.  A spot radiograph was obtained of this image.    Next, a 0.5 ml. volume of the injectate described above was injected. The needle was then redirected to the other facet joint nerves mentioned above if needed.  Prior to the procedure, the patient was given a  Pain Diary which was completed for baseline measurements.  After the procedure, the patient rated their pain every 30 minutes and will continue rating at this frequency for a total of 5 hours.  The patient has been asked to complete the Diary and return to [place] by mail, fax or hand delivered as soon as possible.   Additional Comments:  The patient tolerated the procedure well Dressing: Band-Aid    Post-procedure details: Patient was observed during the procedure. Post-procedure instructions were reviewed.  Patient left the clinic in stable condition.

## 2018-01-28 NOTE — Progress Notes (Signed)
[name] - 57 y.o. female MRN [id number]  Date of birth: 1961-08-08  Office Visit Note: Visit Date: 01/21/2018 PCP: Alfonse Spruce, FNP Referred by: Orlando Penner*  Subjective: Chief Complaint  Patient presents with  . Lower Back - Pain   HPI: [name] is a 57 year old female that comes in today for planned medial branch block of the L4-5 facet joints bilaterally.  Please see our prior evaluation and management note for further details and justification.   ROS Otherwise per HPI.  Assessment & Plan: Visit Diagnoses:  1. Spondylosis without myelopathy or radiculopathy, lumbar region     Plan: No additional findings.   Meds & Orders:  Meds ordered this encounter  Medications  . bupivacaine (MARCAINE) 0.5 % (with pres) injection 3 mL    Orders Placed This Encounter  Procedures  . Facet Injection  . XR C-ARM NO REPORT    Follow-up: Return if symptoms worsen or fail to improve, for Pain diary review.   Procedures: No procedures performed  Lumbar Diagnostic Facet Joint Nerve Block with Fluoroscopic Guidance   Patient: [name]      Date of Birth: 16-Jun-1961 MRN: [id number] PCP: Alfonse Spruce, FNP      Visit Date: 01/21/2018   Universal Protocol:    Date/Time: 04/10/195:35 AM  Consent Given By: the patient  Position: PRONE  Additional Comments: Vital signs were monitored before and after the procedure. Patient was prepped and draped in the usual sterile fashion. The correct patient, procedure, and site was verified.   Injection Procedure Details:  Procedure Site One Meds Administered:  Meds ordered this encounter  Medications  . bupivacaine (MARCAINE) 0.5 % (with pres) injection 3 mL     Laterality: Bilateral  Location/Site: Bilateral L3 and L4 medial branches L4-L5  Needle size: 22 ga.  Needle type:spinal  Needle Placement: Oblique pedical  Findings:   -Comments: There was excellent flow of contrast along the  articular pillars without intravascular flow.  Procedure Details: The fluoroscope beam is vertically oriented in AP and then obliqued 15 to 20 degrees to the ipsilateral side of the desired nerve to achieve the "Scotty dog" appearance.  The skin over the target area of the junction of the superior articulating process and the transverse process (sacral ala if blocking the L5 dorsal rami) was locally anesthetized with a 1 ml volume of 1% Lidocaine without Epinephrine.  The spinal needle was inserted and advanced in a trajectory view down to the target.   After contact with periosteum and negative aspirate for blood and CSF, correct placement without intravascular or epidural spread was confirmed by injecting 0.5 ml. of Isovue-250.  A spot radiograph was obtained of this image.    Next, a 0.5 ml. volume of the injectate described above was injected. The needle was then redirected to the other facet joint nerves mentioned above if needed.  Prior to the procedure, the patient was given a Pain Diary which was completed for baseline measurements.  After the procedure, the patient rated their pain every 30 minutes and will continue rating at this frequency for a total of 5 hours.  The patient has been asked to complete the Diary and return to [place] by mail, fax or hand delivered as soon as possible.   Additional Comments:  The patient tolerated the procedure well Dressing: Band-Aid    Post-procedure details: Patient was observed during the procedure. Post-procedure instructions were reviewed.  Patient left the clinic in stable  condition.   Clinical History: MRI LUMBAR SPINE WITHOUT CONTRAST  TECHNIQUE: Multiplanar, multisequence MR imaging of the lumbar spine was performed. No intravenous contrast was administered.  COMPARISON:  CT abdomen pelvis dated May 05, 2017; lumbar spine x-rays dated April 07, 2017.  FINDINGS: Segmentation:  Standard.  Alignment:  Physiologic.  Vertebrae:  No  fracture, evidence of discitis, or bone lesion.  Conus medullaris: Extends to the L1 level and appears normal.  Paraspinal and other soft tissues: Negative.  Disc levels:  T12-L1:  Normal.  L1-L2:  Normal.  L2-L3:  Small left foraminal protrusion.  No neural impingement.  L3-L4: Small left foraminal disc protrusion. No neural impingement.  L4-L5:  Normal.  L5-S1:  Tiny disc bulge.  No neural impingement.  IMPRESSION: 1. 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 No high-grade spinal canal or neuroforaminal stenosis. No neural impingement.   Electronically Signed   By: Titus Dubin M.D.   On: 05/16/2017 11:07  LUMBAR SPINE - COMPLETE 4+ VIEW  COMPARISON: 08/02/2016  FINDINGS: There are 5 nonrib bearing lumbar-type vertebral bodies.  The vertebral body heights are maintained.  The alignment is anatomic. There is no static listhesis. There is no spondylolysis.  There is no acute fracture.  The disc spaces are maintained.  The SI joints are unremarkable.  IMPRESSION: No focal abnormality of the lumbar spine.   Electronically Signed By: Kathreen Devoid On: 04/07/2017 09:41   She reports that she has never smoked. She has never used smokeless tobacco. No results for input(s): HGBA1C, LABURIC in the last 8760 hours.  Objective:  VS:  HT:    WT:   BMI:     BP:(!) 145/77  HR:88bpm  TEMP:98.4 F (36.9 C)( )  RESP:96 % Physical Exam  Musculoskeletal:  Patient has concordant low back pain with extension rotation and facet loading of the lumbar spine.    Ortho Exam Imaging: No results found.  Past Medical/Family/Surgical/Social History: Medications & Allergies reviewed per EMR, new medications updated. Patient Active Problem List   Diagnosis Date Noted  . Spondylosis without myelopathy or radiculopathy, lumbar region 09/17/2017  . Essential hypertension 09/05/2017  . Chronic bilateral low back pain  with left-sided sciatica 09/05/2017  . HTN (hypertension) 05/05/2017  . Elevated lactic acid level 05/05/2017  . Hypothyroidism 02/24/2017  . Mixed hyperlipidemia 02/24/2017  . 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 Sleep related headaches 01/23/2017  . Snoring 01/23/2017  . 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 Chronic headaches 10/30/2016  . Obesity 05/24/2016  . PMB (postmenopausal bleeding) 05/24/2016  . Back pain of lumbar region with sciatica 09/28/2015   Past Medical History:  Diagnosis Date  . Anxiety   . Depression   . Hypertension   . Migraines    Family History  Problem Relation Age of Onset  . Headache Neg Hx    Past Surgical History:  Procedure Laterality Date  . BREAST CYST EXCISION Right 1980  . BREAST CYST EXCISION Left 1985  . NO PAST SURGERIES     Social History   Occupational History  . Occupation: Unemployed  Tobacco Use  . Smoking status: Never Smoker  . Smokeless tobacco: Never Used  Substance and Sexual Activity  . Alcohol use: No    Alcohol/week: 0.0 oz  . Drug use: No  . Sexual activity: Not Currently    Partners: Male

## 2018-02-02 ENCOUNTER — Encounter (INDEPENDENT_AMBULATORY_CARE_PROVIDER_SITE_OTHER): Payer: Self-pay | Admitting: Physical Medicine and Rehabilitation

## 2018-02-02 ENCOUNTER — Ambulatory Visit (INDEPENDENT_AMBULATORY_CARE_PROVIDER_SITE_OTHER): Payer: Medicaid Other

## 2018-02-02 ENCOUNTER — Ambulatory Visit (INDEPENDENT_AMBULATORY_CARE_PROVIDER_SITE_OTHER): Payer: Medicaid Other | Admitting: Physical Medicine and Rehabilitation

## 2018-02-02 VITALS — BP 147/91 | HR 71 | Temp 98.1°F

## 2018-02-02 DIAGNOSIS — G8929 Other chronic pain: Secondary | ICD-10-CM

## 2018-02-02 DIAGNOSIS — M47816 Spondylosis without myelopathy or radiculopathy, lumbar region: Secondary | ICD-10-CM

## 2018-02-02 DIAGNOSIS — M545 Low back pain: Secondary | ICD-10-CM

## 2018-02-02 MED ORDER — BUPIVACAINE HCL 0.5 % IJ SOLN
3.0000 mL | Freq: Once | INTRAMUSCULAR | Status: AC
  Administered 2018-02-02: 3 mL

## 2018-02-02 NOTE — Progress Notes (Signed)
   Numeric Pain Rating Scale and Functional Assessment Average Pain 10   In the last MONTH (on 0-10 scale) has pain interfered with the following?  1. General activity like being  able to carry out your everyday physical activities such as walking, climbing stairs, carrying groceries, or moving a chair?  Rating(3)   +Driver(cab), -BT, -Dye Allergies.

## 2018-02-02 NOTE — Patient Instructions (Signed)

## 2018-02-02 NOTE — Procedures (Signed)
Lumbar Diagnostic Facet Joint Nerve Block with Fluoroscopic Guidance   Patient: [name]      Date of Birth: 11-Apr-1961 MRN: [id number] PCP: Alfonse Spruce, FNP      Visit Date: 02/02/2018   Universal Protocol:    Date/Time: 04/15/198:23 AM  Consent Given By: the patient  Position: PRONE  Additional Comments: Vital signs were monitored before and after the procedure. Patient was prepped and draped in the usual sterile fashion. The correct patient, procedure, and site was verified.   Injection Procedure Details:  Procedure Site One Meds Administered:  Meds ordered this encounter  Medications  . bupivacaine (MARCAINE) 0.5 % (with pres) injection 3 mL     Laterality: Bilateral  Location/Site: Bilateral L3 and L4 medial branches L4-L5  Needle size: 22 ga.  Needle type:spinal  Needle Placement: Oblique pedical  Findings:   -Comments: There was excellent flow of contrast along the articular pillars without intravascular flow.  Procedure Details: The fluoroscope beam is vertically oriented in AP and then obliqued 15 to 20 degrees to the ipsilateral side of the desired nerve to achieve the "Scotty dog" appearance.  The skin over the target area of the junction of the superior articulating process and the transverse process (sacral ala if blocking the L5 dorsal rami) was locally anesthetized with a 1 ml volume of 1% Lidocaine without Epinephrine.  The spinal needle was inserted and advanced in a trajectory view down to the target.   After contact with periosteum and negative aspirate for blood and CSF, correct placement without intravascular or epidural spread was confirmed by injecting 0.5 ml. of Isovue-250.  A spot radiograph was obtained of this image.    Next, a 0.5 ml. volume of the injectate described above was injected. The needle was then redirected to the other facet joint nerves mentioned above if needed.  Prior to the procedure, the patient was given a  Pain Diary which was completed for baseline measurements.  After the procedure, the patient rated their pain every 30 minutes and will continue rating at this frequency for a total of 5 hours.  The patient has been asked to complete the Diary and return to [place] by mail, fax or hand delivered as soon as possible.   Additional Comments:  The patient tolerated the procedure well Dressing: Band-Aid    Post-procedure details: Patient was observed during the procedure. Post-procedure instructions were reviewed.  Patient left the clinic in stable condition.

## 2018-02-02 NOTE — Progress Notes (Signed)
[name] - 57 y.o. female MRN [id number]  Date of birth: 1961-05-21  Office Visit Note: Visit Date: 02/02/2018 PCP: Alfonse Spruce, FNP Referred by: Orlando Penner*  Subjective: Chief Complaint  Patient presents with  . Lower Back - Pain  . Left Leg - Pain   HPI: [name] is a 57 year old female with chronic worsening axial low back pain left.  Pain sometimes occurs into the upper buttocks but not down the legs.  She gets occasional upper thigh pain on the she has completed diagnostic medial branch blocks with good relief and pain diary is documented.  She got more than 75% relief.  She is questioning the fact that it did not last very long with a long talk again about the fact that these are diagnostic blocks and not meant to last very long necessarily.  She did do well with it however.  She has had physical therapy at least one course she has had a hard time with physical therapy with Medicaid.  She has had medication management.  Nothing seems to help at this point other than the injection.  She has had this for a long.  Going on several years.  Exam is consistent with facet mediated pain but pain with extension and facet joint loading.   ROS Otherwise per HPI.  Assessment & Plan: Visit Diagnoses:  1. Spondylosis without myelopathy or radiculopathy, lumbar region   2. Chronic bilateral low back pain without sciatica     Plan: No additional findings.   Meds & Orders:  Meds ordered this encounter  Medications  . bupivacaine (MARCAINE) 0.5 % (with pres) injection 3 mL    Orders Placed This Encounter  Procedures  . Facet Injection  . XR C-ARM NO REPORT    Follow-up: Return for Pain diary review and possible RFA.   Procedures: No procedures performed  Lumbar Diagnostic Facet Joint Nerve Block with Fluoroscopic Guidance   Patient: [name]      Date of Birth: 13-Aug-1961 MRN: [id number] PCP: Alfonse Spruce, FNP      Visit Date: 02/02/2018   Universal Protocol:    Date/Time: 04/15/198:23 AM  Consent Given By: the patient  Position: PRONE  Additional Comments: Vital signs were monitored before and after the procedure. Patient was prepped and draped in the usual sterile fashion. The correct patient, procedure, and site was verified.   Injection Procedure Details:  Procedure Site One Meds Administered:  Meds ordered this encounter  Medications  . bupivacaine (MARCAINE) 0.5 % (with pres) injection 3 mL     Laterality: Bilateral  Location/Site: Bilateral L3 and L4 medial branches L4-L5  Needle size: 22 ga.  Needle type:spinal  Needle Placement: Oblique pedical  Findings:   -Comments: There was excellent flow of contrast along the articular pillars without intravascular flow.  Procedure Details: The fluoroscope beam is vertically oriented in AP and then obliqued 15 to 20 degrees to the ipsilateral side of the desired nerve to achieve the "Scotty dog" appearance.  The skin over the target area of the junction of the superior articulating process and the transverse process (sacral ala if blocking the L5 dorsal rami) was locally anesthetized with a 1 ml volume of 1% Lidocaine without Epinephrine.  The spinal needle was inserted and advanced in a trajectory view down to the target.   After contact with periosteum and negative aspirate for blood and CSF, correct placement without intravascular or epidural spread was confirmed by injecting 0.5  ml. of Isovue-250.  A spot radiograph was obtained of this image.    Next, a 0.5 ml. volume of the injectate described above was injected. The needle was then redirected to the other facet joint nerves mentioned above if needed.  Prior to the procedure, the patient was given a Pain Diary which was completed for baseline measurements.  After the procedure, the patient rated their pain every 30 minutes and will continue rating at this frequency for a total of 5 hours.  The patient  has been asked to complete the Diary and return to [place] by mail, fax or hand delivered as soon as possible.   Additional Comments:  The patient tolerated the procedure well Dressing: Band-Aid    Post-procedure details: Patient was observed during the procedure. Post-procedure instructions were reviewed.  Patient left the clinic in stable condition.   Clinical History: MRI LUMBAR SPINE WITHOUT CONTRAST  TECHNIQUE: Multiplanar, multisequence MR imaging of the lumbar spine was performed. No intravenous contrast was administered.  COMPARISON:  CT abdomen pelvis dated May 05, 2017; lumbar spine x-rays dated April 07, 2017.  FINDINGS: Segmentation:  Standard.  Alignment:  Physiologic.  Vertebrae:  No fracture, evidence of discitis, or bone lesion.  Conus medullaris: Extends to the L1 level and appears normal.  Paraspinal and other soft tissues: Negative.  Disc levels:  T12-L1:  Normal.  L1-L2:  Normal.  L2-L3:  Small left foraminal protrusion.  No neural impingement.  L3-L4: Small left foraminal disc protrusion. No neural impingement.  L4-L5:  Normal.  L5-S1:  Tiny disc bulge.  No neural impingement.  IMPRESSION: 1. 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 No high-grade spinal canal or neuroforaminal stenosis. No neural impingement.   Electronically Signed   By: Titus Dubin M.D.   On: 05/16/2017 11:07  LUMBAR SPINE - COMPLETE 4+ VIEW  COMPARISON: 08/02/2016  FINDINGS: There are 5 nonrib bearing lumbar-type vertebral bodies.  The vertebral body heights are maintained.  The alignment is anatomic. There is no static listhesis. There is no spondylolysis.  There is no acute fracture.  The disc spaces are maintained.  The SI joints are unremarkable.  IMPRESSION: No focal abnormality of the lumbar spine.   Electronically Signed By: Kathreen Devoid On: 04/07/2017 09:41   She reports that  she has never smoked. She has never used smokeless tobacco. No results for input(s): HGBA1C, LABURIC in the last 8760 hours.  Objective:  VS:  HT:    WT:   BMI:     BP:(!) 147/91  HR:71bpm  TEMP:98.1 F (36.7 C)(Oral)  RESP:95 % Physical Exam  Musculoskeletal:  Patient has concordant low back pain with extension rotation and facet loading of the lumbar spine. No pain over the greater trochanters and good distal strength.  Neurological: She exhibits normal muscle tone. Coordination normal.    Ortho Exam Imaging: No results found.  Past Medical/Family/Surgical/Social History: Medications & Allergies reviewed per EMR, new medications updated. Patient Active Problem List   Diagnosis Date Noted  . Spondylosis without myelopathy or radiculopathy, lumbar region 09/17/2017  . Essential hypertension 09/05/2017  . Chronic bilateral low back pain with left-sided sciatica 09/05/2017  . HTN (hypertension) 05/05/2017  . Elevated lactic acid level 05/05/2017  . Hypothyroidism 02/24/2017  . Mixed hyperlipidemia 02/24/2017  . 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 Sleep related headaches 01/23/2017  . Snoring 01/23/2017  . 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 Chronic headaches 10/30/2016  . Obesity 05/24/2016  .  PMB (postmenopausal bleeding) 05/24/2016  . Back pain of lumbar region with sciatica 09/28/2015   Past Medical History:  Diagnosis Date  . Anxiety   . Depression   . Hypertension   . Migraines    Family History  Problem Relation Age of Onset  . Headache Neg Hx    Past Surgical History:  Procedure Laterality Date  . BREAST CYST EXCISION Right 1980  . BREAST CYST EXCISION Left 1985  . NO PAST SURGERIES     Social History   Occupational History  . Occupation: Unemployed  Tobacco Use  . Smoking status: Never Smoker  . Smokeless tobacco: Never Used  Substance and Sexual Activity  . Alcohol use: No    Alcohol/week: 0.0 oz  . Drug use: No  . Sexual activity:  Not Currently    Partners: Male

## 2018-02-04 ENCOUNTER — Encounter (INDEPENDENT_AMBULATORY_CARE_PROVIDER_SITE_OTHER): Payer: Self-pay | Admitting: Physical Medicine and Rehabilitation

## 2018-02-10 ENCOUNTER — Encounter (INDEPENDENT_AMBULATORY_CARE_PROVIDER_SITE_OTHER): Payer: Self-pay | Admitting: Physical Medicine and Rehabilitation

## 2018-02-11 ENCOUNTER — Encounter (INDEPENDENT_AMBULATORY_CARE_PROVIDER_SITE_OTHER): Payer: Self-pay | Admitting: Physical Medicine and Rehabilitation

## 2018-02-11 ENCOUNTER — Telehealth (INDEPENDENT_AMBULATORY_CARE_PROVIDER_SITE_OTHER): Payer: Self-pay | Admitting: *Deleted

## 2018-02-11 NOTE — Telephone Encounter (Signed)
Pt is scheduled for Bil RFA 03/12/18 and 03/19/18. Pt insurance does not require pre auth for RFA.

## 2018-02-12 ENCOUNTER — Encounter: Payer: Self-pay | Admitting: Physician Assistant

## 2018-02-12 ENCOUNTER — Ambulatory Visit: Payer: Medicaid Other | Attending: Family Medicine | Admitting: Physician Assistant

## 2018-02-12 ENCOUNTER — Encounter: Payer: Self-pay | Admitting: Internal Medicine

## 2018-02-12 VITALS — BP 147/94 | HR 71 | Temp 98.5°F | Ht 67.0 in | Wt 219.8 lb

## 2018-02-12 DIAGNOSIS — Z880 Allergy status to penicillin: Secondary | ICD-10-CM | POA: Insufficient documentation

## 2018-02-12 DIAGNOSIS — F329 Major depressive disorder, single episode, unspecified: Secondary | ICD-10-CM | POA: Insufficient documentation

## 2018-02-12 DIAGNOSIS — E039 Hypothyroidism, unspecified: Secondary | ICD-10-CM | POA: Diagnosis not present

## 2018-02-12 DIAGNOSIS — I10 Essential (primary) hypertension: Secondary | ICD-10-CM

## 2018-02-12 DIAGNOSIS — E782 Mixed hyperlipidemia: Secondary | ICD-10-CM | POA: Diagnosis not present

## 2018-02-12 DIAGNOSIS — Z1211 Encounter for screening for malignant neoplasm of colon: Secondary | ICD-10-CM | POA: Diagnosis not present

## 2018-02-12 DIAGNOSIS — F419 Anxiety disorder, unspecified: Secondary | ICD-10-CM | POA: Diagnosis not present

## 2018-02-12 DIAGNOSIS — Z79899 Other long term (current) drug therapy: Secondary | ICD-10-CM | POA: Diagnosis not present

## 2018-02-12 DIAGNOSIS — Z79890 Hormone replacement therapy (postmenopausal): Secondary | ICD-10-CM | POA: Insufficient documentation

## 2018-02-12 MED ORDER — AMLODIPINE BESYLATE 10 MG PO TABS: 10.0000 mg | ORAL_TABLET | Freq: Every day | ORAL | 3 refills | Status: DC

## 2018-02-12 MED ORDER — LEVOTHYROXINE SODIUM 50 MCG PO TABS
50.0000 ug | ORAL_TABLET | Freq: Every day | ORAL | 1 refills | Status: DC
Start: 2018-02-12 — End: 2018-05-06

## 2018-02-12 MED ORDER — MELOXICAM 15 MG PO TABS: 15.0000 mg | ORAL_TABLET | Freq: Every day | ORAL | 0 refills | Status: DC

## 2018-02-12 MED ORDER — PROPRANOLOL HCL 80 MG PO TABS: 80.0000 mg | ORAL_TABLET | Freq: Two times a day (BID) | ORAL | 5 refills | Status: DC

## 2018-02-12 MED ORDER — ATORVASTATIN CALCIUM 20 MG PO TABS: 20.0000 mg | ORAL_TABLET | Freq: Every day | ORAL | 1 refills | Status: DC

## 2018-02-12 NOTE — Progress Notes (Signed)
Patient is here for hypertension.  Patient request refills.

## 2018-02-12 NOTE — Patient Instructions (Addendum)
Check blood pressure 3 times weekly and record.  schedule visit sooner than 3 months if your readings are consistently higher than 135/85   Hypertension Hypertension is another name for high blood pressure. High blood pressure forces your heart to work harder to pump blood. This can cause problems over time. There are two numbers in a blood pressure reading. There is a top number (systolic) over a bottom number (diastolic). It is best to have a blood pressure below 120/80. Healthy choices can help lower your blood pressure. You may need medicine to help lower your blood pressure if:  Your blood pressure cannot be lowered with healthy choices.  Your blood pressure is higher than 130/80.  Follow these instructions at home: Eating and drinking  If directed, follow the DASH eating plan. This diet includes: ? Filling half of your plate at each meal with fruits and vegetables. ? Filling one quarter of your plate at each meal with whole grains. Whole grains include whole wheat pasta, brown rice, and whole grain bread. ? Eating or drinking low-fat dairy products, such as skim milk or low-fat yogurt. ? Filling one quarter of your plate at each meal with low-fat (lean) proteins. Low-fat proteins include fish, skinless chicken, eggs, beans, and tofu. ? Avoiding fatty meat, cured and processed meat, or chicken with skin. ? Avoiding premade or processed food.  Eat less than 1,500 mg of salt (sodium) a day.  Limit alcohol use to no more than 1 drink a day for nonpregnant women and 2 drinks a day for men. One drink equals 12 oz of beer, 5 oz of wine, or 1 oz of hard liquor. Lifestyle  Work with your doctor to stay at a healthy weight or to lose weight. Ask your doctor what the best weight is for you.  Get at least 30 minutes of exercise that causes your heart to beat faster (aerobic exercise) most days of the week. This may include walking, swimming, or biking.  Get at least 30 minutes of exercise  that strengthens your muscles (resistance exercise) at least 3 days a week. This may include lifting weights or pilates.  Do not use any products that contain nicotine or tobacco. This includes cigarettes and e-cigarettes. If you need help quitting, ask your doctor.  Check your blood pressure at home as told by your doctor.  Keep all follow-up visits as told by your doctor. This is important. Medicines  Take over-the-counter and prescription medicines only as told by your doctor. Follow directions carefully.  Do not skip doses of blood pressure medicine. The medicine does not work as well if you skip doses. Skipping doses also puts you at risk for problems.  Ask your doctor about side effects or reactions to medicines that you should watch for. Contact a doctor if:  You think you are having a reaction to the medicine you are taking.  You have headaches that keep coming back (recurring).  You feel dizzy.  You have swelling in your ankles.  You have trouble with your vision. Get help right away if:  You get a very bad headache.  You start to feel confused.  You feel weak or numb.  You feel faint.  You get very bad pain in your: ? Chest. ? Belly (abdomen).  You throw up (vomit) more than once.  You have trouble breathing. Summary  Hypertension is another name for high blood pressure.  Making healthy choices can help lower blood pressure. If your blood pressure cannot be  controlled with healthy choices, you may need to take medicine. This information is not intended to replace advice given to you by your health care provider. Make sure you discuss any questions you have with your health care provider. Document Released: 03/25/2008 Document Revised: 09/04/2016 Document Reviewed: 09/04/2016 Elsevier Interactive Patient Education  [employer].

## 2018-02-12 NOTE — Progress Notes (Signed)
Patient ID: [name], female   DOB: [birth year], 57 y.o.   MRN: [id number]     Lurlie Wigen, is a 57 y.o. female  OAC:[id number]  WFU:[id number]  DOB - 05/17/1961  Subjective:  Chief Complaint and HPI: [name] is a 57 y.o. female here today for med RF.  She is compliant with her medications.  She is having some dry skin on her arms and abdomen.  No CP/Dizziness/SOB.    Wants to get referral for screening colonoscopy.     ROS:   Constitutional:  No f/c, No night sweats, No unexplained weight loss. EENT:  No vision changes, No blurry vision, No hearing changes. No mouth, throat, or ear problems.  Respiratory: No cough, No SOB Cardiac: No CP, no palpitations GI:  No abd pain, No N/V/D. GU: No Urinary s/sx Musculoskeletal: No joint pain Neuro: No headache, no dizziness, no motor weakness.  Skin: No rash; dry skin Endocrine:  No polydipsia. No polyuria.  Psych: Denies SI/HI  No problems updated.  ALLERGIES: Allergies  Allergen Reactions  . Iron Diarrhea  . Penicillins Nausea And Vomiting    Has patient had a PCN reaction causing immediate rash, facial/tongue/throat swelling, SOB or lightheadedness with hypotension: No Has patient had a PCN reaction causing severe rash involving mucus membranes or skin necrosis: No Has patient had a PCN reaction that required hospitalization No Has patient had a PCN reaction occurring within the last 10 years: No If all of the above answers are "NO", then may proceed with Cephalosporin use.    PAST MEDICAL HISTORY: Past Medical History:  Diagnosis Date  . Anxiety   . Depression   . Hypertension   . Migraines     MEDICATIONS AT HOME: Prior to Admission medications   Medication Sig Start Date End Date Taking? Authorizing Provider  acetaminophen (TYLENOL) 325 MG tablet Take 2 tablets (650 mg total) by mouth every 6 (six) hours as needed for moderate pain. 10/10/17  Yes Alfonse Spruce, FNP  amitriptyline (ELAVIL) 25 MG  tablet Take 1 tablet (25 mg total) at bedtime by mouth. 09/05/17  Yes Hairston, Mandesia R, FNP  atorvastatin (LIPITOR) 20 MG tablet Take 1 tablet (20 mg total) by mouth daily. 02/12/18  Yes Argentina Donovan, PA-C  Cholecalciferol (VITAMIN D) 2000 units tablet Take 2,000 Units by mouth daily.   Yes [provider]  divalproex (DEPAKOTE) 500 MG DR tablet Take one tablet in the morning and 2 at night Patient taking differently: Take 500-1,000 mg by mouth See admin instructions. Take 500mg  in the morning and 1000mg  at night 10/29/16  Yes Melvenia Beam, MD  escitalopram (LEXAPRO) 10 MG tablet Take 10 mg by mouth at bedtime. 01/13/17  Yes [provider]  levothyroxine (SYNTHROID, LEVOTHROID) 50 MCG tablet Take 1 tablet (50 mcg total) by mouth daily. 02/12/18  Yes Cynthis Purington, Dionne Bucy, PA-C  meloxicam (MOBIC) 15 MG tablet Take 1 tablet (15 mg total) by mouth daily. Take with food 02/12/18  Yes Ilianna Bown M, PA-C  methocarbamol (ROBAXIN) 500 MG tablet Take 1 tablet (500 mg total) by mouth every 8 (eight) hours as needed for muscle spasms. 12/02/17  Yes Magnus Sinning, MD  naproxen (NAPROSYN) 500 MG tablet TAKE ONE TABLET TWICE A DAY AS NEEDED WITH MEALS. 09/05/17  Yes Hairston, Mandesia R, FNP  propranolol (INDERAL) 80 MG tablet Take 1 tablet (80 mg total) by mouth 2 (two) times daily. 02/12/18  Yes Freeman Caldron M, PA-C  amLODipine (NORVASC) 10  MG tablet Take 1 tablet (10 mg total) by mouth daily. 02/12/18   Argentina Donovan, PA-C  fluconazole (DIFLUCAN) 150 MG tablet Take 1 tablet (150 mg total) by mouth once for 1 dose. 10/10/17   [provider]  metroNIDAZOLE (FLAGYL) 500 MG tablet Take 1 tablet (500 mg total) by mouth 2 (two) times daily. Patient not taking: Reported on 02/12/2018 10/16/17   Alfonse Spruce, FNP  miconazole (MONISTAT 7 SIMPLY CURE) 2 % vaginal cream Apply to affected external areas twice a day for 7 days. Patient not taking: Reported on 02/12/2018  10/10/17   Alfonse Spruce, FNP  Omega-3 Fatty Acids (FISH OIL) 1000 MG CAPS Take 1,000 mg by mouth 2 (two) times daily.    [provider]     Objective:  EXAM:   Vitals:   02/12/18 0833  BP: (!) 147/94  Pulse: 71  Temp: 98.5 F (36.9 C)  TempSrc: Oral  SpO2: 96%  Weight: 219 lb 12.8 oz (99.7 kg)  Height: 5\' 7"  (1.702 m)    General appearance : A&OX3. NAD. Non-toxic-appearing HEENT: Atraumatic and Normocephalic.  PERRLA. EOM intact.   Neck: supple, no JVD. No cervical lymphadenopathy. No thyromegaly Chest/Lungs:  Breathing-non-labored, Good air entry bilaterally, breath sounds normal without rales, rhonchi, or wheezing  CVS: S1 S2 regular, no murmurs, gallops, rubs  Extremities: Bilateral Lower Ext shows no edema, both legs are warm to touch with = pulse throughout Neurology:  CN II-XII grossly intact, Non focal.   Psych:  TP linear. J/I WNL. Normal speech. Appropriate eye contact and affect.  Skin:  Dry skin on abdomen and arms; no eczema or rash  Data Review No results found for: HGBA1C   Assessment & Plan   1. Hypothyroidism, unspecified type - Thyroid Panel With TSH - levothyroxine (SYNTHROID, LEVOTHROID) 50 MCG tablet; Take 1 tablet (50 mcg total) by mouth daily.  Dispense: 90 tablet; Refill: 1 -we will check levels since she is having dry skin.  I have advised skin lotion or coconut oil for dry skin.    2. Hypertension, unspecified type uncontrolled - Comprehensive metabolic panel Increase dose- amLODipine (NORVASC) 10 MG tablet; Take 1 tablet (10 mg total) by mouth daily.  Dispense: 90 tablet; Refill: 3 -continue-- propranolol (INDERAL) 80 MG tablet; Take 1 tablet (80 mg total) by mouth 2 (two) times daily.  Dispense: 60 tablet; Refill: 5 Check blood pressure 3 times weekly and record.  schedule visit sooner than 3 months if your readings are consistently higher than 135/85  3. Mixed hyperlipidemia - Lipid panel - atorvastatin (LIPITOR) 20 MG  tablet; Take 1 tablet (20 mg total) by mouth daily.  Dispense: 90 tablet; Refill: 1  4. Colon cancer screening - Ambulatory referral to Gastroenterology     Patient have been counseled extensively about nutrition and exercise  Return in about 3 months (around 05/14/2018) for assign PCP; thyroid/htn/lipids.  The patient was given clear instructions to go to ER or return to medical center if symptoms don't improve, worsen or new problems develop. The patient verbalized understanding. The patient was told to call to get lab results if they haven't heard anything in the next week.     Freeman Caldron, PA-C [facility] [place], [place]   02/12/2018, 8:43 AM

## 2018-02-13 ENCOUNTER — Telehealth: Payer: Self-pay

## 2018-02-13 ENCOUNTER — Other Ambulatory Visit: Payer: Self-pay | Admitting: Physician Assistant

## 2018-02-13 DIAGNOSIS — R739 Hyperglycemia, unspecified: Secondary | ICD-10-CM

## 2018-02-13 LAB — COMPREHENSIVE METABOLIC PANEL
ALBUMIN: 4.3 g/dL (ref 3.5–5.5)
ALK PHOS: 101 IU/L (ref 39–117)
ALT: 29 IU/L (ref 0–32)
AST: 22 IU/L (ref 0–40)
Albumin/Globulin Ratio: 1 — ABNORMAL LOW (ref 1.2–2.2)
BUN / CREAT RATIO: 15 (ref 9–23)
BUN: 14 mg/dL (ref 6–24)
Bilirubin Total: <0.2 mg/dL (ref 0.0–1.2)
CO2: 23 mmol/L (ref 20–29)
CREATININE: 0.94 mg/dL (ref 0.57–1.00)
Calcium: 9.1 mg/dL (ref 8.7–10.2)
Chloride: 101 mmol/L (ref 96–106)
GFR calc Af Amer: 78 mL/min/{1.73_m2} (ref >59)
GFR calc non Af Amer: 68 mL/min/{1.73_m2} (ref >59)
GLUCOSE: 211 mg/dL — AB (ref 65–99)
Globulin, Total: 4.3 g/dL (ref 1.5–4.5)
Potassium: 4.1 mmol/L (ref 3.5–5.2)
Sodium: 139 mmol/L (ref 134–144)
Total Protein: 8.6 g/dL — ABNORMAL HIGH (ref 6.0–8.5)

## 2018-02-13 LAB — THYROID PANEL WITH TSH
FREE THYROXINE INDEX: 1.9 (ref 1.2–4.9)
T3 UPTAKE RATIO: 24 % (ref 24–39)
T4 TOTAL: 8.1 ug/dL (ref 4.5–12.0)
TSH: 2.2 u[IU]/mL (ref 0.450–4.500)

## 2018-02-13 LAB — LIPID PANEL
Chol/HDL Ratio: 9.2 ratio — ABNORMAL HIGH (ref 0.0–4.4)
Cholesterol, Total: 303 mg/dL — ABNORMAL HIGH (ref 100–199)
HDL: 33 mg/dL — ABNORMAL LOW (ref >39)
LDL Calculated: 202 mg/dL — ABNORMAL HIGH (ref 0–99)
Triglycerides: 340 mg/dL — ABNORMAL HIGH (ref 0–149)
VLDL Cholesterol Cal: 68 mg/dL — ABNORMAL HIGH (ref 5–40)

## 2018-02-13 MED ORDER — ATORVASTATIN CALCIUM 40 MG PO TABS: 40.0000 mg | ORAL_TABLET | Freq: Every day | ORAL | 3 refills | Status: DC

## 2018-02-13 NOTE — Telephone Encounter (Signed)
CMA called patient to inform on lab results and advising.  Patient understood and is aware to come in for blood work.

## 2018-02-13 NOTE — Telephone Encounter (Signed)
-----   Message from Argentina Donovan, [place] sent at 02/13/2018 12:14 PM EDT ----- Your blood sugar is high.  We need to a blood test for diabetes.  I have placed an order.  You can come in for a lab appointment at your convenience to have this drawn.  In the mean time, decrease your sugar and carbohydrate intake and drink more water.  Eliminate sweets, desserts, candies, and sugary drinks from your diet.  Your thyroid test was good and you should continue the same dose of medication.  I am going to increASE YOUR DOSE OF LIPITOR TO 40MG   Because your cholesterol is still high.  Other labs look ok. Thanks, Freeman Caldron, PA-C

## 2018-02-14 ENCOUNTER — Encounter: Payer: Self-pay | Admitting: Physician Assistant

## 2018-02-16 ENCOUNTER — Encounter: Payer: Self-pay | Admitting: Physician Assistant

## 2018-02-20 ENCOUNTER — Ambulatory Visit: Payer: Medicaid Other | Attending: Family Medicine

## 2018-02-20 DIAGNOSIS — R739 Hyperglycemia, unspecified: Secondary | ICD-10-CM | POA: Diagnosis not present

## 2018-02-20 NOTE — Progress Notes (Signed)
Patient here for lab visit only 

## 2018-02-21 LAB — HEMOGLOBIN A1C
ESTIMATED AVERAGE GLUCOSE: 166 mg/dL
Hgb A1c MFr Bld: 7.4 % — ABNORMAL HIGH (ref 4.8–5.6)

## 2018-02-23 ENCOUNTER — Telehealth: Payer: Self-pay

## 2018-02-23 ENCOUNTER — Encounter: Payer: Self-pay | Admitting: Physician Assistant

## 2018-02-23 ENCOUNTER — Other Ambulatory Visit: Payer: Self-pay | Admitting: Physician Assistant

## 2018-02-23 MED ORDER — METFORMIN HCL 500 MG PO TABS: 500.0000 mg | ORAL_TABLET | Freq: Two times a day (BID) | ORAL | 3 refills | Status: DC

## 2018-02-23 NOTE — Telephone Encounter (Signed)
-----   Message from Argentina Donovan, [place] sent at 02/23/2018  8:11 AM EDT ----- Your blood work shows diabetes.  It may be possible this is reversible with aggressive diabetic diet and exercise.  I have sent you a prescription of metformin to start which will help.  Eliminate sugars and white carbohydrates, sugary drinks, sodas, and juices from your diet.  Follow up in 2-3 months.  Thanks, Freeman Caldron, PA-C

## 2018-02-23 NOTE — Telephone Encounter (Signed)
CMA spoke to patient to inform on lab results and new RX has been sent to pharmacy.  Patient understood.

## 2018-02-23 NOTE — Progress Notes (Signed)
Patient wanted to know if she can drink Glucerna.

## 2018-02-26 ENCOUNTER — Encounter: Payer: Self-pay | Admitting: Physician Assistant

## 2018-02-26 NOTE — Progress Notes (Signed)
CMA called patient to inform above. Patient understood.

## 2018-03-01 ENCOUNTER — Encounter: Payer: Self-pay | Admitting: Physician Assistant

## 2018-03-06 ENCOUNTER — Encounter: Payer: Self-pay | Admitting: Physician Assistant

## 2018-03-06 NOTE — Telephone Encounter (Signed)
CMA called patient to inform her she have a an upcoming appt. With Dr. Wynetta Emery and will follow-up with PCP.  Patient understood.

## 2018-03-09 NOTE — Telephone Encounter (Signed)
Please advise on this concern, patient will be establishing with you in July.

## 2018-03-12 ENCOUNTER — Ambulatory Visit (INDEPENDENT_AMBULATORY_CARE_PROVIDER_SITE_OTHER): Payer: Self-pay

## 2018-03-12 ENCOUNTER — Encounter (INDEPENDENT_AMBULATORY_CARE_PROVIDER_SITE_OTHER): Payer: Self-pay | Admitting: Physical Medicine and Rehabilitation

## 2018-03-12 ENCOUNTER — Ambulatory Visit (INDEPENDENT_AMBULATORY_CARE_PROVIDER_SITE_OTHER): Payer: Medicaid Other | Admitting: Physical Medicine and Rehabilitation

## 2018-03-12 VITALS — BP 138/90

## 2018-03-12 DIAGNOSIS — M545 Low back pain, unspecified: Secondary | ICD-10-CM

## 2018-03-12 DIAGNOSIS — G8929 Other chronic pain: Secondary | ICD-10-CM

## 2018-03-12 DIAGNOSIS — M47816 Spondylosis without myelopathy or radiculopathy, lumbar region: Secondary | ICD-10-CM | POA: Diagnosis not present

## 2018-03-12 MED ORDER — METHYLPREDNISOLONE ACETATE 80 MG/ML IJ SUSP
40.0000 mg | Freq: Once | INTRAMUSCULAR | Status: AC
  Administered 2018-03-12: 40 mg

## 2018-03-12 MED ORDER — METHYLPREDNISOLONE ACETATE 80 MG/ML IJ SUSP
80.0000 mg | Freq: Once | INTRAMUSCULAR | Status: DC
Start: 2018-03-12 — End: 2018-03-12

## 2018-03-12 NOTE — Progress Notes (Signed)
.  Numeric Pain Rating Scale and Functional Assessment Average Pain 9   In the last MONTH (on 0-10 scale) has pain interfered with the following?  1. General activity like being  able to carry out your everyday physical activities such as walking, climbing stairs, carrying groceries, or moving a chair?  Rating(8)   +Driver, -BT, -Dye Allergies.  

## 2018-03-12 NOTE — Patient Instructions (Signed)

## 2018-03-13 ENCOUNTER — Encounter (INDEPENDENT_AMBULATORY_CARE_PROVIDER_SITE_OTHER): Payer: Self-pay | Admitting: Physical Medicine and Rehabilitation

## 2018-03-19 ENCOUNTER — Ambulatory Visit (INDEPENDENT_AMBULATORY_CARE_PROVIDER_SITE_OTHER): Payer: Self-pay

## 2018-03-19 ENCOUNTER — Ambulatory Visit (INDEPENDENT_AMBULATORY_CARE_PROVIDER_SITE_OTHER): Payer: Medicaid Other | Admitting: Physical Medicine and Rehabilitation

## 2018-03-19 ENCOUNTER — Encounter (INDEPENDENT_AMBULATORY_CARE_PROVIDER_SITE_OTHER): Payer: Self-pay | Admitting: Physical Medicine and Rehabilitation

## 2018-03-19 VITALS — BP 137/85 | HR 77

## 2018-03-19 DIAGNOSIS — M47816 Spondylosis without myelopathy or radiculopathy, lumbar region: Secondary | ICD-10-CM

## 2018-03-19 DIAGNOSIS — G8929 Other chronic pain: Secondary | ICD-10-CM | POA: Diagnosis not present

## 2018-03-19 DIAGNOSIS — M545 Low back pain: Secondary | ICD-10-CM | POA: Diagnosis not present

## 2018-03-19 MED ORDER — METHYLPREDNISOLONE ACETATE 80 MG/ML IJ SUSP
40.0000 mg | Freq: Once | INTRAMUSCULAR | Status: AC
  Administered 2018-03-19: 40 mg

## 2018-03-19 NOTE — Progress Notes (Signed)
Pt states no changes since last week. Pt states pain in lower back on the right side.  .Numeric Pain Rating Scale and Functional Assessment Average Pain 8   In the last MONTH (on 0-10 scale) has pain interfered with the following?  1. General activity like being  able to carry out your everyday physical activities such as walking, climbing stairs, carrying groceries, or moving a chair?  Rating(0)   +Driver, -BT, -Dye Allergies.

## 2018-03-19 NOTE — Progress Notes (Addendum)
[name] - 57 y.o. female MRN [id number]  Date of birth: 20-Jan-1961  Office Visit Note: Visit Date: 03/19/2018 PCP: Alfonse Spruce, FNP Referred by: Orlando Penner*  Subjective: Chief Complaint  Patient presents with  . Lower Back - Pain   HPI: [name] is a 57 year old female that comes in today for planned right L4-5 facet joint radiofrequency ablation.  We have completed prior left-sided ablation procedure and she has felt some better from that.  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  She has had no new problems since we have seen her.  She has had double diagnostic blocks with pain diary as well as physical therapy and conservative care that has not really helped.  Please see our prior notes for further evaluation and justification.   ROS Otherwise per HPI.  Assessment & Plan: Visit Diagnoses:  1. Spondylosis without myelopathy or radiculopathy, lumbar region   2. Chronic bilateral low back pain without sciatica     Plan: No additional findings.   Meds & Orders:  Meds ordered this encounter  Medications  . methylPREDNISolone acetate (DEPO-MEDROL) injection 40 mg    Orders Placed This Encounter  Procedures  . Radiofrequency,Lumbar  . XR C-ARM NO REPORT    Follow-up: Return in about 1 month (around 04/16/2018).   Procedures: No procedures performed  Lumbar Facet Joint Nerve Denervation  Patient: [name]      Date of Birth: 1961/03/21 MRN: [id number] PCP: Alfonse Spruce, FNP      Visit Date: 03/19/2018   Universal Protocol:    Date/Time: 05/30/194:40 PM  Consent Given By: the patient  Position: PRONE  Additional Comments: Vital signs were monitored before and after the procedure. Patient was prepped and draped in the usual sterile fashion. The correct patient, procedure, and site was verified.   Injection Procedure Details:  Procedure Site  One Meds Administered:  Meds ordered this encounter  Medications  . methylPREDNISolone acetate (DEPO-MEDROL) injection 40 mg     Laterality: Right  Location/Site:  L4-L5  Needle size: 18 G  Needle type: Radiofrequency cannula  Needle Placement: Along juncture of superior articular process and transverse pocess  Findings:  -Comments:  Procedure Details: For each desired target nerve, the corresponding transverse process (sacral ala for the L5 dorsal rami) was identified and the fluoroscope was positioned to square off the endplates of the corresponding vertebral body to achieve a true AP midline view.  The beam was then obliqued 15 to 20 degrees and caudally tilted 15 to 20 degrees to line up a trajectory along the target nerves. The skin over the target of the junction of superior articulating process and transverse process (sacral ala for the L5 dorsal rami) was infiltrated with 25ml of 1% Lidocaine without Epinephrine.  The 18 gauge 1mm active tip outer cannula was advanced in trajectory view to the target.  This procedure was repeated for each target nerve.  Then, for all levels, the outer cannula placement was fine-tuned and the position was then confirmed with bi-planar imaging.    Test stimulation was done both at sensory and motor levels to ensure there was no radicular stimulation. The target tissues were then infiltrated with 1 ml of 1% Lidocaine without Epinephrine. Subsequently, a percutaneous neurotomy was carried out for 90 seconds at 80 degrees Celsius.  After the completion of the lesions, 1 ml of injectate was delivered.  It was then repeated for each facet joint nerve mentioned above. Appropriate radiographs were obtained to verify the probe placement during the neurotomy.   Additional Comments:  The patient tolerated the procedure well Dressing: Band-Aid    Post-procedure details: Patient was observed during the procedure. Post-procedure instructions were  reviewed.  Patient left the clinic in stable condition.      Clinical History: MRI LUMBAR SPINE WITHOUT CONTRAST  TECHNIQUE: Multiplanar, multisequence MR imaging of the lumbar spine was performed. No intravenous contrast was administered.  COMPARISON:  CT abdomen pelvis dated May 05, 2017; lumbar spine x-rays dated April 07, 2017.  FINDINGS: Segmentation:  Standard.  Alignment:  Physiologic.  Vertebrae:  No fracture, evidence of discitis, or bone lesion.  Conus medullaris: Extends to the L1 level and appears normal.  Paraspinal and other soft tissues: Negative.  Disc levels:  T12-L1:  Normal.  L1-L2:  Normal.  L2-L3:  Small left foraminal protrusion.  No neural impingement.  L3-L4: Small left foraminal disc protrusion. No neural impingement.  L4-L5:  Normal.  L5-S1:  Tiny disc bulge.  No neural impingement.  IMPRESSION: 1. 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 No high-grade spinal canal or neuroforaminal stenosis. No neural impingement.   Electronically Signed   By: Titus Dubin M.D.   On: 05/16/2017 11:07  LUMBAR SPINE - COMPLETE 4+ VIEW  COMPARISON: 08/02/2016  FINDINGS: There are 5 nonrib bearing lumbar-type vertebral bodies.  The vertebral body heights are maintained.  The alignment is anatomic. There is no static listhesis. There is no spondylolysis.  There is no acute fracture.  The disc spaces are maintained.  The SI joints are unremarkable.  IMPRESSION: No focal abnormality of the lumbar spine.   Electronically Signed By: Kathreen Devoid On: 04/07/2017 09:41   She reports that she has never smoked. She has never used smokeless tobacco.  Recent Labs    02/20/18 0829  HGBA1C 7.4*    Objective:  VS:  HT:    WT:   BMI:     BP:137/85  HR:77bpm  TEMP: ( )  RESP:  Physical Exam  Ortho Exam Imaging: Xr C-arm No Report  Result Date: 03/19/2018 Please see  Notes or Procedures tab for imaging impression.   Past Medical/Family/Surgical/Social History: Medications & Allergies reviewed per EMR, new medications updated. Patient Active Problem List   Diagnosis Date Noted  . Spondylosis without myelopathy or radiculopathy, lumbar region 09/17/2017  . Essential hypertension 09/05/2017  . Chronic bilateral low back pain with left-sided sciatica 09/05/2017  . HTN (hypertension) 05/05/2017  . Elevated lactic acid level 05/05/2017  . Hypothyroidism 02/24/2017  . Mixed hyperlipidemia 02/24/2017  . 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 Sleep related headaches 01/23/2017  . Snoring 01/23/2017  . 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 Chronic headaches 10/30/2016  . Obesity 05/24/2016  . PMB (postmenopausal bleeding) 05/24/2016  . Back pain of lumbar region with sciatica 09/28/2015   Past Medical History:  Diagnosis Date  . Anxiety   . Depression   . Hypertension   . Migraines    Family History  Problem Relation Age of Onset  . Headache Neg Hx    Past Surgical History:  Procedure Laterality Date  . BREAST CYST EXCISION Right 1980  . BREAST CYST EXCISION Left 1985  . NO PAST SURGERIES     Social History   Occupational History  . Occupation: Unemployed  Tobacco Use  . Smoking status: Never Smoker  . Smokeless  tobacco: Never Used  Substance and Sexual Activity  . Alcohol use: No    Alcohol/week: 0.0 oz  . Drug use: No  . Sexual activity: Not Currently    Partners: Male

## 2018-03-19 NOTE — Patient Instructions (Signed)

## 2018-03-19 NOTE — Procedures (Signed)
Lumbar Facet Joint Nerve Denervation  Patient: [name]      Date of Birth: 1961/10/11 MRN: [id number] PCP: Alfonse Spruce, FNP      Visit Date: 03/12/2018   Universal Protocol:    Date/Time: 05/30/194:44 PM  Consent Given By: the patient  Position: PRONE  Additional Comments: Vital signs were monitored before and after the procedure. Patient was prepped and draped in the usual sterile fashion. The correct patient, procedure, and site was verified.   Injection Procedure Details:  Procedure Site One Meds Administered:  Meds ordered this encounter  Medications  . DISCONTD: methylPREDNISolone acetate (DEPO-MEDROL) injection 80 mg  . methylPREDNISolone acetate (DEPO-MEDROL) injection 40 mg     Laterality: Left  Location/Site:  L4-L5  Needle size: 18 G  Needle type: Radiofrequency cannula  Needle Placement: Along juncture of superior articular process and transverse pocess  Findings:  -Comments:  Procedure Details: For each desired target nerve, the corresponding transverse process (sacral ala for the L5 dorsal rami) was identified and the fluoroscope was positioned to square off the endplates of the corresponding vertebral body to achieve a true AP midline view.  The beam was then obliqued 15 to 20 degrees and caudally tilted 15 to 20 degrees to line up a trajectory along the target nerves. The skin over the target of the junction of superior articulating process and transverse process (sacral ala for the L5 dorsal rami) was infiltrated with 38ml of 1% Lidocaine without Epinephrine.  The 18 gauge 65mm active tip outer cannula was advanced in trajectory view to the target.  This procedure was repeated for each target nerve.  Then, for all levels, the outer cannula placement was fine-tuned and the position was then confirmed with bi-planar imaging.    Test stimulation was done both at sensory and motor levels to ensure there was no radicular stimulation. The  target tissues were then infiltrated with 1 ml of 1% Lidocaine without Epinephrine. Subsequently, a percutaneous neurotomy was carried out for 90 seconds at 80 degrees Celsius. After the completion of the lesions, 1 ml of injectate was delivered. It was then repeated for each facet joint nerve mentioned above. Appropriate radiographs were obtained to verify the probe placement during the neurotomy.   Additional Comments:  The patient tolerated the procedure well Dressing: Band-Aid    Post-procedure details: Patient was observed during the procedure. Post-procedure instructions were reviewed.  Patient left the clinic in stable condition.

## 2018-03-19 NOTE — Progress Notes (Signed)
AUTUM [name] - 57 y.o. female MRN [id number]  Date of birth: Jun 17, 1961  Office Visit Note: Visit Date: 03/12/2018 PCP: Alfonse Spruce, FNP Referred by: Orlando Penner*  Subjective: Chief Complaint  Patient presents with  . Lower Back - Pain  . Left Leg - Pain   HPI: [name] is a pleasant 57 year old female with chronic worsening axial low back pain a little bit usually right more than left but today a little bit more left than right.  She does get some referral pattern into the posterior lateral and anterior thigh but nothing past the knee.  She has had no numbness tingling or paresthesia.  She is failed conservative care including medication management with anti-inflammatory muscle relaxer.  She is on Medicaid and has not really had great physical therapy but has had some physical therapy without much relief.  She has learned exercise modification and core strengthening.  She continues to have pain is been worsening over the last year or so and chronically longer than that.  X-ray imaging and MRI show facet arthropathy particularly at the L4-5 level right more than left.  This is moderate.  She has concordant low back pain with extension rotation and standing.  Facet joint loading with extension does reproduce a lot of her pain.  She has had double diagnostic medial branch blocks with good relief with pain diary.  We are going to complete radiofrequency ablation of the left L4-5 facet joint.   ROS Otherwise per HPI.  Assessment & Plan: Visit Diagnoses:  1. Spondylosis without myelopathy or radiculopathy, lumbar region   2. Chronic bilateral low back pain without sciatica     Plan: No additional findings.   Meds & Orders:  Meds ordered this encounter  Medications  . DISCONTD: methylPREDNISolone acetate (DEPO-MEDROL) injection 80 mg  . methylPREDNISolone acetate (DEPO-MEDROL) injection 40 mg    Orders Placed This Encounter  Procedures  . Radiofrequency,Lumbar    . XR C-ARM NO REPORT    Follow-up: Return if symptoms worsen or fail to improve.   Procedures: No procedures performed  Lumbar Facet Joint Nerve Denervation  Patient: [name]      Date of Birth: January 27, 1961 MRN: [id number] PCP: Alfonse Spruce, FNP      Visit Date: 03/12/2018   Universal Protocol:    Date/Time: 05/30/194:44 PM  Consent Given By: the patient  Position: PRONE  Additional Comments: Vital signs were monitored before and after the procedure. Patient was prepped and draped in the usual sterile fashion. The correct patient, procedure, and site was verified.   Injection Procedure Details:  Procedure Site One Meds Administered:  Meds ordered this encounter  Medications  . DISCONTD: methylPREDNISolone acetate (DEPO-MEDROL) injection 80 mg  . methylPREDNISolone acetate (DEPO-MEDROL) injection 40 mg     Laterality: Left  Location/Site:  L4-L5  Needle size: 18 G  Needle type: Radiofrequency cannula  Needle Placement: Along juncture of superior articular process and transverse pocess  Findings:  -Comments:  Procedure Details: For each desired target nerve, the corresponding transverse process (sacral ala for the L5 dorsal rami) was identified and the fluoroscope was positioned to square off the endplates of the corresponding vertebral body to achieve a true AP midline view.  The beam was then obliqued 15 to 20 degrees and caudally tilted 15 to 20 degrees to line up a trajectory along the target nerves. The skin over the target of the junction of superior articulating process and transverse process (sacral  ala for the L5 dorsal rami) was infiltrated with 43ml of 1% Lidocaine without Epinephrine.  The 18 gauge 69mm active tip outer cannula was advanced in trajectory view to the target.  This procedure was repeated for each target nerve.  Then, for all levels, the outer cannula placement was fine-tuned and the position was then confirmed with bi-planar  imaging.    Test stimulation was done both at sensory and motor levels to ensure there was no radicular stimulation. The target tissues were then infiltrated with 1 ml of 1% Lidocaine without Epinephrine. Subsequently, a percutaneous neurotomy was carried out for 90 seconds at 80 degrees Celsius. After the completion of the lesions, 1 ml of injectate was delivered. It was then repeated for each facet joint nerve mentioned above. Appropriate radiographs were obtained to verify the probe placement during the neurotomy.   Additional Comments:  The patient tolerated the procedure well Dressing: Band-Aid    Post-procedure details: Patient was observed during the procedure. Post-procedure instructions were reviewed.  Patient left the clinic in stable condition.      Clinical History: MRI LUMBAR SPINE WITHOUT CONTRAST  TECHNIQUE: Multiplanar, multisequence MR imaging of the lumbar spine was performed. No intravenous contrast was administered.  COMPARISON:  CT abdomen pelvis dated May 05, 2017; lumbar spine x-rays dated April 07, 2017.  FINDINGS: Segmentation:  Standard.  Alignment:  Physiologic.  Vertebrae:  No fracture, evidence of discitis, or bone lesion.  Conus medullaris: Extends to the L1 level and appears normal.  Paraspinal and other soft tissues: Negative.  Disc levels:  T12-L1:  Normal.  L1-L2:  Normal.  L2-L3:  Small left foraminal protrusion.  No neural impingement.  L3-L4: Small left foraminal disc protrusion. No neural impingement.  L4-L5:  Normal.  L5-S1:  Tiny disc bulge.  No neural impingement.  IMPRESSION: 1. 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 No high-grade spinal canal or neuroforaminal stenosis. No neural impingement.   Electronically Signed   By: Titus Dubin M.D.   On: 05/16/2017 11:07  LUMBAR SPINE - COMPLETE 4+ VIEW  COMPARISON: 08/02/2016  FINDINGS: There are 5 nonrib  bearing lumbar-type vertebral bodies.  The vertebral body heights are maintained.  The alignment is anatomic. There is no static listhesis. There is no spondylolysis.  There is no acute fracture.  The disc spaces are maintained.  The SI joints are unremarkable.  IMPRESSION: No focal abnormality of the lumbar spine.   Electronically Signed By: Kathreen Devoid On: 04/07/2017 09:41   She reports that she has never smoked. She has never used smokeless tobacco.  Recent Labs    02/20/18 0829  HGBA1C 7.4*    Objective:  VS:  HT:    WT:   BMI:     BP:138/90  HR: bpm  TEMP: ( )  RESP:  Physical Exam  Ortho Exam Imaging: Xr C-arm No Report  Result Date: 03/19/2018 Please see Notes or Procedures tab for imaging impression.   Past Medical/Family/Surgical/Social History: Medications & Allergies reviewed per EMR, new medications updated. Patient Active Problem List   Diagnosis Date Noted  . Spondylosis without myelopathy or radiculopathy, lumbar region 09/17/2017  . Essential hypertension 09/05/2017  . Chronic bilateral low back pain with left-sided sciatica 09/05/2017  . HTN (hypertension) 05/05/2017  . Elevated lactic acid level 05/05/2017  . Hypothyroidism 02/24/2017  . Mixed hyperlipidemia 02/24/2017  . 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 Sleep related headaches 01/23/2017  . Snoring 01/23/2017  . Insomnia due to  mental condition 01/23/2017  . Chronic headaches 10/30/2016  . Obesity 05/24/2016  . PMB (postmenopausal bleeding) 05/24/2016  . Back pain of lumbar region with sciatica 09/28/2015   Past Medical History:  Diagnosis Date  . Anxiety   . Depression   . Hypertension   . Migraines    Family History  Problem Relation Age of Onset  . Headache Neg Hx    Past Surgical History:  Procedure Laterality Date  . BREAST CYST EXCISION Right 1980  . BREAST CYST EXCISION Left 1985  . NO PAST SURGERIES     Social History   Occupational History  .  Occupation: Unemployed  Tobacco Use  . Smoking status: Never Smoker  . Smokeless tobacco: Never Used  Substance and Sexual Activity  . Alcohol use: No    Alcohol/week: 0.0 oz  . Drug use: No  . Sexual activity: Not Currently    Partners: Male

## 2018-03-19 NOTE — Procedures (Signed)
Lumbar Facet Joint Nerve Denervation  Patient: [name]      Date of Birth: 04-Jun-1961 MRN: [id number] PCP: Alfonse Spruce, FNP      Visit Date: 03/19/2018   Universal Protocol:    Date/Time: 05/30/194:40 PM  Consent Given By: the patient  Position: PRONE  Additional Comments: Vital signs were monitored before and after the procedure. Patient was prepped and draped in the usual sterile fashion. The correct patient, procedure, and site was verified.   Injection Procedure Details:  Procedure Site One Meds Administered:  Meds ordered this encounter  Medications  . methylPREDNISolone acetate (DEPO-MEDROL) injection 40 mg     Laterality: Right  Location/Site:  L4-L5  Needle size: 18 G  Needle type: Radiofrequency cannula  Needle Placement: Along juncture of superior articular process and transverse pocess  Findings:  -Comments:  Procedure Details: For each desired target nerve, the corresponding transverse process (sacral ala for the L5 dorsal rami) was identified and the fluoroscope was positioned to square off the endplates of the corresponding vertebral body to achieve a true AP midline view.  The beam was then obliqued 15 to 20 degrees and caudally tilted 15 to 20 degrees to line up a trajectory along the target nerves. The skin over the target of the junction of superior articulating process and transverse process (sacral ala for the L5 dorsal rami) was infiltrated with 71ml of 1% Lidocaine without Epinephrine.  The 18 gauge 20mm active tip outer cannula was advanced in trajectory view to the target.  This procedure was repeated for each target nerve.  Then, for all levels, the outer cannula placement was fine-tuned and the position was then confirmed with bi-planar imaging.    Test stimulation was done both at sensory and motor levels to ensure there was no radicular stimulation. The target tissues were then infiltrated with 1 ml of 1% Lidocaine without  Epinephrine. Subsequently, a percutaneous neurotomy was carried out for 90 seconds at 80 degrees Celsius.  After the completion of the lesions, 1 ml of injectate was delivered. It was then repeated for each facet joint nerve mentioned above. Appropriate radiographs were obtained to verify the probe placement during the neurotomy.   Additional Comments:  The patient tolerated the procedure well Dressing: Band-Aid    Post-procedure details: Patient was observed during the procedure. Post-procedure instructions were reviewed.  Patient left the clinic in stable condition.

## 2018-03-21 ENCOUNTER — Encounter (INDEPENDENT_AMBULATORY_CARE_PROVIDER_SITE_OTHER): Payer: Self-pay | Admitting: Physical Medicine and Rehabilitation

## 2018-04-04 ENCOUNTER — Encounter (INDEPENDENT_AMBULATORY_CARE_PROVIDER_SITE_OTHER): Payer: Self-pay | Admitting: Physical Medicine and Rehabilitation

## 2018-04-07 ENCOUNTER — Encounter (INDEPENDENT_AMBULATORY_CARE_PROVIDER_SITE_OTHER): Payer: Self-pay | Admitting: Physical Medicine and Rehabilitation

## 2018-04-15 ENCOUNTER — Encounter (INDEPENDENT_AMBULATORY_CARE_PROVIDER_SITE_OTHER): Payer: Self-pay | Admitting: Physical Medicine and Rehabilitation

## 2018-04-16 ENCOUNTER — Ambulatory Visit (INDEPENDENT_AMBULATORY_CARE_PROVIDER_SITE_OTHER): Payer: Medicaid Other | Admitting: Physical Medicine and Rehabilitation

## 2018-04-20 ENCOUNTER — Encounter: Payer: Medicaid Other | Admitting: Internal Medicine

## 2018-05-06 ENCOUNTER — Other Ambulatory Visit: Payer: Self-pay | Admitting: Physician Assistant

## 2018-05-06 DIAGNOSIS — E039 Hypothyroidism, unspecified: Secondary | ICD-10-CM

## 2018-05-11 ENCOUNTER — Other Ambulatory Visit: Payer: Self-pay | Admitting: Physician Assistant

## 2018-05-11 DIAGNOSIS — E782 Mixed hyperlipidemia: Secondary | ICD-10-CM

## 2018-05-15 ENCOUNTER — Ambulatory Visit: Payer: Medicaid Other | Admitting: Internal Medicine

## 2018-05-22 ENCOUNTER — Encounter: Payer: Self-pay | Admitting: Internal Medicine

## 2018-05-22 ENCOUNTER — Ambulatory Visit: Payer: Medicaid Other | Attending: Internal Medicine | Admitting: Internal Medicine

## 2018-05-22 VITALS — BP 135/85 | HR 64 | Temp 98.1°F | Resp 16 | Ht 67.5 in | Wt 200.2 lb

## 2018-05-22 DIAGNOSIS — Z880 Allergy status to penicillin: Secondary | ICD-10-CM | POA: Diagnosis not present

## 2018-05-22 DIAGNOSIS — R197 Diarrhea, unspecified: Secondary | ICD-10-CM | POA: Insufficient documentation

## 2018-05-22 DIAGNOSIS — M545 Low back pain: Secondary | ICD-10-CM | POA: Diagnosis not present

## 2018-05-22 DIAGNOSIS — E039 Hypothyroidism, unspecified: Secondary | ICD-10-CM | POA: Diagnosis not present

## 2018-05-22 DIAGNOSIS — M47816 Spondylosis without myelopathy or radiculopathy, lumbar region: Secondary | ICD-10-CM | POA: Diagnosis not present

## 2018-05-22 DIAGNOSIS — M5136 Other intervertebral disc degeneration, lumbar region: Secondary | ICD-10-CM | POA: Diagnosis not present

## 2018-05-22 DIAGNOSIS — R51 Headache: Secondary | ICD-10-CM | POA: Diagnosis not present

## 2018-05-22 DIAGNOSIS — I10 Essential (primary) hypertension: Secondary | ICD-10-CM | POA: Diagnosis not present

## 2018-05-22 DIAGNOSIS — Z7689 Persons encountering health services in other specified circumstances: Secondary | ICD-10-CM | POA: Diagnosis not present

## 2018-05-22 DIAGNOSIS — G8929 Other chronic pain: Secondary | ICD-10-CM | POA: Diagnosis not present

## 2018-05-22 DIAGNOSIS — K5900 Constipation, unspecified: Secondary | ICD-10-CM | POA: Diagnosis not present

## 2018-05-22 DIAGNOSIS — F329 Major depressive disorder, single episode, unspecified: Secondary | ICD-10-CM | POA: Insufficient documentation

## 2018-05-22 DIAGNOSIS — E782 Mixed hyperlipidemia: Secondary | ICD-10-CM | POA: Diagnosis not present

## 2018-05-22 DIAGNOSIS — Z79890 Hormone replacement therapy (postmenopausal): Secondary | ICD-10-CM | POA: Diagnosis not present

## 2018-05-22 DIAGNOSIS — M5440 Lumbago with sciatica, unspecified side: Secondary | ICD-10-CM

## 2018-05-22 DIAGNOSIS — Z79899 Other long term (current) drug therapy: Secondary | ICD-10-CM | POA: Insufficient documentation

## 2018-05-22 DIAGNOSIS — Z1211 Encounter for screening for malignant neoplasm of colon: Secondary | ICD-10-CM

## 2018-05-22 DIAGNOSIS — M5432 Sciatica, left side: Secondary | ICD-10-CM | POA: Insufficient documentation

## 2018-05-22 DIAGNOSIS — R634 Abnormal weight loss: Secondary | ICD-10-CM | POA: Diagnosis not present

## 2018-05-22 DIAGNOSIS — E119 Type 2 diabetes mellitus without complications: Secondary | ICD-10-CM

## 2018-05-22 DIAGNOSIS — Z7984 Long term (current) use of oral hypoglycemic drugs: Secondary | ICD-10-CM | POA: Insufficient documentation

## 2018-05-22 LAB — POCT GLYCOSYLATED HEMOGLOBIN (HGB A1C): HbA1c, POC (prediabetic range): 5.7 % (ref 5.7–6.4)

## 2018-05-22 LAB — GLUCOSE, POCT (MANUAL RESULT ENTRY): POC Glucose: 105 mg/dl — AB (ref 70–99)

## 2018-05-22 MED ORDER — MELOXICAM 15 MG PO TABS
15.0000 mg | ORAL_TABLET | Freq: Every day | ORAL | 6 refills | Status: DC
Start: 2018-05-22 — End: 2018-10-24

## 2018-05-22 NOTE — Progress Notes (Signed)
comcommPt states she is still having trouble with her back

## 2018-05-22 NOTE — Progress Notes (Signed)
Patient ID: [name], female    DOB: 1961-06-06  MRN: [id number]  CC: re-estbalish and Hypothyroidism   Subjective: [name] is a 57 y.o. female who presents for chronic ds management and to establish with me as PCP. Her concerns today include:  HTN, HL, depression, hypothyroid, DM, tension HA  Med reconciliation was done.  Patient reports that she is on  Depakote, Elavil for chronic headaches through her neurologist Dr. Jaynee Eagles  DM: does not check BS Loss 19 lbs since last visit however does not feel like it.  States he has had no change in close size.  However she reports that she is eating less.  She has history of depression and is on Lexapro.  Reports that her depression is under good control on this medication. -Compliant with metformin.  Thyroid: feels hot and cold, palipitations at times.  Diarrhea/constipation sometimes.  Reports compliance with levothyroxine.  Depression:  good control on Lexapro.  HL: tolerating Lipitor without side effects.  Chronic LBP: Has history of chronic lower back pain.  MRI done about a year ago revealed minimal degenerative disc disease in the lumbar spine with no high-grade spinal canal or neuroforaminal stenosis.  No neural impingement.  She had been seeing PMR Dr. Ernestina Patches and has had some injections to the lumbar spine which she did not find helpful.  She had another procedure in May of this year where he "burn the nerve."  That also has not helped.  Pain is in the lower back most of the time and sometimes radiates to the left leg.  She takes methocarbamol as needed.  She plans to see a chiropractor for some sessions to see if it will help.  HM: has appt with GI on 05/26/2018 for c-scope  Patient Active Problem List   Diagnosis Date Noted  . Spondylosis without myelopathy or radiculopathy, lumbar region 09/17/2017  . Essential hypertension 09/05/2017  . Chronic bilateral low back pain with left-sided sciatica 09/05/2017  . HTN  (hypertension) 05/05/2017  . Elevated lactic acid level 05/05/2017  . Hypothyroidism 02/24/2017  . Mixed hyperlipidemia 02/24/2017  . 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 Sleep related headaches 01/23/2017  . Snoring 01/23/2017  . 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 Chronic headaches 10/30/2016  . Obesity 05/24/2016  . PMB (postmenopausal bleeding) 05/24/2016  . Back pain of lumbar region with sciatica 09/28/2015     Current Outpatient Medications on File Prior to Visit  Medication Sig Dispense Refill  . acetaminophen (TYLENOL) 325 MG tablet Take 2 tablets (650 mg total) by mouth every 6 (six) hours as needed for moderate pain. 30 tablet   . amitriptyline (ELAVIL) 25 MG tablet Take 1 tablet (25 mg total) at bedtime by mouth. 30 tablet 5  . amLODipine (NORVASC) 10 MG tablet Take 1 tablet (10 mg total) by mouth daily. 90 tablet 3  . atorvastatin (LIPITOR) 40 MG tablet Take 1 tablet (40 mg total) by mouth daily. 90 tablet 3  . Cholecalciferol (VITAMIN D) 2000 units tablet Take 2,000 Units by mouth daily.    . divalproex (DEPAKOTE) 500 MG DR tablet Take one tablet in the morning and 2 at night (Patient taking differently: Take 500-1,000 mg by mouth See admin instructions. Take 500mg  in the morning and 1000mg  at night) 90 tablet 6  . escitalopram (LEXAPRO) 10 MG tablet Take 10 mg by mouth at bedtime.  2  . levothyroxine (SYNTHROID, LEVOTHROID) 50 MCG tablet Take 1 tablet (50 mcg total)  by mouth daily. 30 tablet 0  . metFORMIN (GLUCOPHAGE) 500 MG tablet Take 1 tablet (500 mg total) by mouth 2 (two) times daily with a meal. 180 tablet 3  . methocarbamol (ROBAXIN) 500 MG tablet Take 1 tablet (500 mg total) by mouth every 8 (eight) hours as needed for muscle spasms. 60 tablet 0  . Omega-3 Fatty Acids (FISH OIL) 1000 MG CAPS Take 1,000 mg by mouth 2 (two) times daily.    . propranolol (INDERAL) 80 MG tablet Take 1 tablet (80 mg total) by mouth 2 (two) times daily. 60 tablet 5   No  current facility-administered medications on file prior to visit.     Allergies  Allergen Reactions  . Iron Diarrhea  . Penicillins Nausea And Vomiting    Has patient had a PCN reaction causing immediate rash, facial/tongue/throat swelling, SOB or lightheadedness with hypotension: No Has patient had a PCN reaction causing severe rash involving mucus membranes or skin necrosis: No Has patient had a PCN reaction that required hospitalization No Has patient had a PCN reaction occurring within the last 10 years: No If all of the above answers are "NO", then may proceed with Cephalosporin use.    Social History   Socioeconomic History  . Marital status: Single    Spouse name: Not on file  . Number of children: 1  . Years of education: 72  . Highest education level: Not on file  Occupational History  . Occupation: Unemployed  Social Needs  . Financial resource strain: Not on file  . Food insecurity:    Worry: Not on file    Inability: Not on file  . Transportation needs:    Medical: Not on file    Non-medical: Not on file  Tobacco Use  . Smoking status: Never Smoker  . Smokeless tobacco: Never Used  Substance and Sexual Activity  . Alcohol use: No    Alcohol/week: 0.0 oz  . Drug use: No  . Sexual activity: Not Currently    Partners: Male  Lifestyle  . Physical activity:    Days per week: Not on file    Minutes per session: Not on file  . Stress: Not on file  Relationships  . Social connections:    Talks on phone: Not on file    Gets together: Not on file    Attends religious service: Not on file    Active member of club or organization: Not on file    Attends meetings of clubs or organizations: Not on file    Relationship status: Not on file  . Intimate partner violence:    Fear of current or ex partner: Not on file    Emotionally abused: Not on file    Physically abused: Not on file    Forced sexual activity: Not on file  Other Topics Concern  . Not on file    Social History Narrative   Lives with son, [name].   Caffeine use: Daily       Left handed    Family History  Problem Relation Age of Onset  . Headache Neg Hx     Past Surgical History:  Procedure Laterality Date  . BREAST CYST EXCISION Right 1980  . BREAST CYST EXCISION Left 1985  . NO PAST SURGERIES      ROS: Review of Systems Negative except as above PHYSICAL EXAM: BP 135/85   Pulse 64   Temp 98.1 F (36.7 C) (Oral)   Resp 16   Ht 5'  7.5" (1.715 m)   Wt 200 lb 3.2 oz (90.8 kg)   LMP 06/06/2015   SpO2 99%   BMI 30.89 kg/m   Wt Readings from Last 3 Encounters:  05/22/18 200 lb 3.2 oz (90.8 kg)  02/12/18 219 lb 12.8 oz (99.7 kg)  10/10/17 212 lb (96.2 kg)    Physical Exam General appearance - alert, well appearing, middle-aged Caucasian female and in no distress Mental status -flat affect.  Answers questions appropriately.   Neck - supple, no significant adenopathy Chest - clear to auscultation, no wheezes, rales or rhonchi, symmetric air entry Heart - normal rate, regular rhythm, normal S1, S2, no murmurs, rubs, clicks or gallops Musculoskeletal -no tenderness on palpation of lumbar spine.  Gait is steady  extremities - peripheral pulses normal, no pedal edema, no clubbing or cyanosis   A1C 5.7/BS 105  Lab Results  Component Value Date   WBC 5.7 05/06/2017   HGB 13.4 05/06/2017   HCT 40.0 05/06/2017   MCV 91.5 05/06/2017   PLT 248 05/06/2017     Chemistry      Component Value Date/Time   NA 139 02/12/2018 0854   K 4.1 02/12/2018 0854   CL 101 02/12/2018 0854   CO2 23 02/12/2018 0854   BUN 14 02/12/2018 0854   CREATININE 0.94 02/12/2018 0854   CREATININE 0.77 09/28/2015 1420      Component Value Date/Time   CALCIUM 9.1 02/12/2018 0854   ALKPHOS 101 02/12/2018 0854   AST 22 02/12/2018 0854   ALT 29 02/12/2018 0854   BILITOT <0.2 02/12/2018 0854       ASSESSMENT AND PLAN: 1. Controlled type 2 diabetes mellitus without complication,  without long-term current use of insulin (HCC) Continue metformin.  Continue healthy eating habits. - POCT glucose (manual entry) - POCT glycosylated hemoglobin (Hb A1C) - Microalbumin / creatinine urine ratio  2. Essential hypertension Close to goal.  Continue amlodipine and propranolol - CBC  3. Hypothyroidism, unspecified type - TSH  4. Unexplained weight loss Check TSH and CBC today.  She will keep appointment for colonoscopy later this month.  She is up-to-date with other age-appropriate cancer screening. - CBC  5. Back pain of lumbar region with sciatica Continue Robaxin as needed.  Stop Naprosyn.  Refill meloxicam.  She plans to try chiropractor.   Patient was given the opportunity to ask questions.  Patient verbalized understanding of the plan and was able to repeat key elements of the plan.   Orders Placed This Encounter  Procedures  . Microalbumin / creatinine urine ratio  . CBC  . TSH  . POCT glucose (manual entry)  . POCT glycosylated hemoglobin (Hb A1C)     Requested Prescriptions   Signed Prescriptions Disp Refills  . meloxicam (MOBIC) 15 MG tablet 30 tablet 6    Sig: Take 1 tablet (15 mg total) by mouth daily. Take with food    No follow-ups on file.  Karle Plumber, MD, FACP

## 2018-05-23 LAB — CBC
Hematocrit: 45.2 % (ref 34.0–46.6)
Hemoglobin: 15.2 g/dL (ref 11.1–15.9)
MCH: 30.6 pg (ref 26.6–33.0)
MCHC: 33.6 g/dL (ref 31.5–35.7)
MCV: 91 fL (ref 79–97)
PLATELETS: 316 10*3/uL (ref 150–450)
RBC: 4.97 x10E6/uL (ref 3.77–5.28)
RDW: 14.6 % (ref 12.3–15.4)
WBC: 7.4 10*3/uL (ref 3.4–10.8)

## 2018-05-23 LAB — MICROALBUMIN / CREATININE URINE RATIO
CREATININE, UR: 322.5 mg/dL
Microalb/Creat Ratio: 18.9 mg/g creat (ref 0.0–30.0)
Microalbumin, Urine: 60.8 ug/mL

## 2018-05-23 LAB — TSH: TSH: 1.77 u[IU]/mL (ref 0.450–4.500)

## 2018-05-26 ENCOUNTER — Other Ambulatory Visit: Payer: Medicaid Other

## 2018-05-26 ENCOUNTER — Ambulatory Visit (AMBULATORY_SURGERY_CENTER): Payer: Self-pay

## 2018-05-26 VITALS — Ht 67.5 in | Wt 201.2 lb

## 2018-05-26 DIAGNOSIS — Z1211 Encounter for screening for malignant neoplasm of colon: Secondary | ICD-10-CM

## 2018-05-26 MED ORDER — NA SULFATE-K SULFATE-MG SULF 17.5-3.13-1.6 GM/177ML PO SOLN: 1.0000 | Freq: Once | ORAL | 0 refills | Status: AC

## 2018-05-26 NOTE — Progress Notes (Signed)
Denies allergies to eggs or soy products. Denies complication of anesthesia or sedation. Denies use of weight loss medication. Denies use of O2.   Emmi instructions declined.  

## 2018-05-29 ENCOUNTER — Other Ambulatory Visit: Payer: Medicaid Other

## 2018-06-04 ENCOUNTER — Encounter: Payer: Self-pay | Admitting: Internal Medicine

## 2018-06-08 ENCOUNTER — Encounter: Payer: Medicaid Other | Admitting: Internal Medicine

## 2018-06-12 ENCOUNTER — Other Ambulatory Visit: Payer: Self-pay

## 2018-06-12 DIAGNOSIS — Z1211 Encounter for screening for malignant neoplasm of colon: Secondary | ICD-10-CM

## 2018-06-12 NOTE — Addendum Note (Signed)
Addended by: Boykin Reaper R on: 06/12/2018 11:34 AM   Modules accepted: Orders

## 2018-06-19 ENCOUNTER — Encounter: Payer: Self-pay | Admitting: Internal Medicine

## 2018-06-19 DIAGNOSIS — M5440 Lumbago with sciatica, unspecified side: Secondary | ICD-10-CM

## 2018-06-20 ENCOUNTER — Encounter: Payer: Self-pay | Admitting: Internal Medicine

## 2018-06-29 ENCOUNTER — Other Ambulatory Visit: Payer: Medicaid Other

## 2018-06-30 ENCOUNTER — Other Ambulatory Visit: Payer: Self-pay | Admitting: Physician Assistant

## 2018-06-30 DIAGNOSIS — E782 Mixed hyperlipidemia: Secondary | ICD-10-CM

## 2018-07-07 ENCOUNTER — Other Ambulatory Visit: Payer: Self-pay | Admitting: Physician Assistant

## 2018-07-07 DIAGNOSIS — I10 Essential (primary) hypertension: Secondary | ICD-10-CM

## 2018-07-20 ENCOUNTER — Ambulatory Visit
Admission: RE | Admit: 2018-07-20 | Discharge: 2018-07-20 | Disposition: A | Discharge status: Home / Self Care | Payer: Medicaid Other | Source: Ambulatory Visit | Attending: Family Medicine | Admitting: Family Medicine

## 2018-07-20 DIAGNOSIS — N6310 Unspecified lump in the right breast, unspecified quadrant: Secondary | ICD-10-CM

## 2018-07-23 IMAGING — CT CT ABD-PELV W/ CM
2 of 5 series · 16 of 46 positions shown, 18 images · IV contrast (Omni 300)
Comparison: None.

CLINICAL DATA: Right-sided abdominal pain. The patient fell 1 week
ago.

EXAM:
CT ABDOMEN AND PELVIS WITH CONTRAST
TECHNIQUE: Multidetector CT imaging of the abdomen and pelvis was performed
using the standard protocol following bolus administration of
intravenous contrast.
CONTRAST:  100mL [id number] IOPAMIDOL ([id number]) INJECTION 61%

[Series 3: a/p w/ 5mm · axial · 0.95mm/px · z∈[+818,+1303]mm · 13 of 109 slices shown, 15 images]
[im 6/109  soft-tissue]
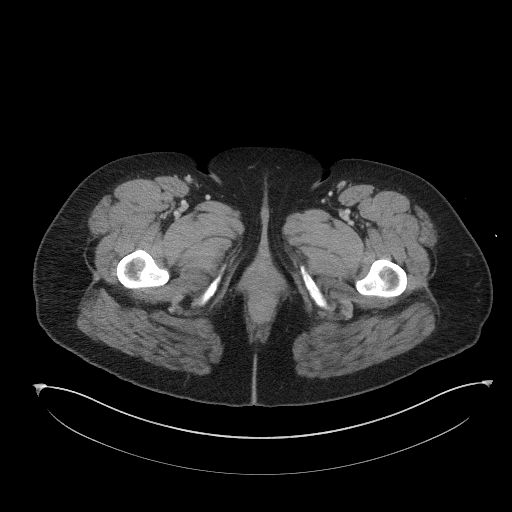
[im 6/109  bone]
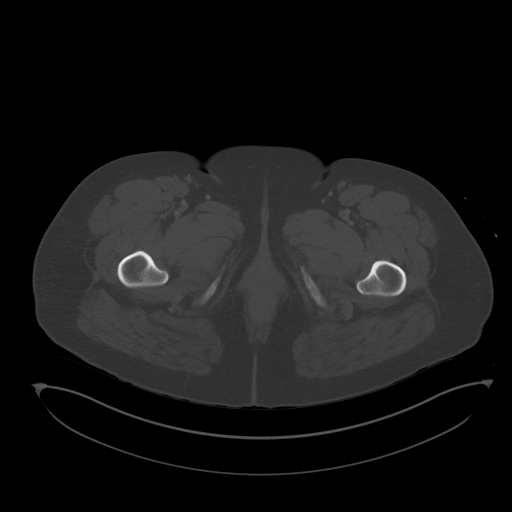
[im 17/109  soft-tissue]
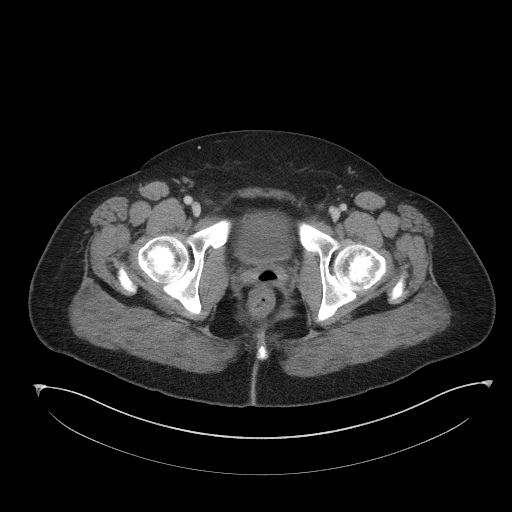
[im 22/109  soft-tissue]
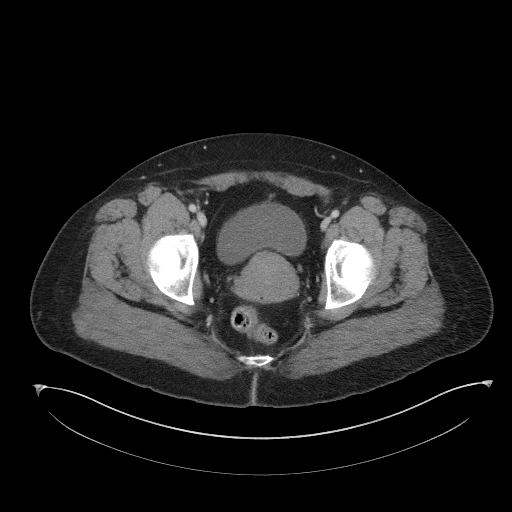
[im 33/109  soft-tissue]
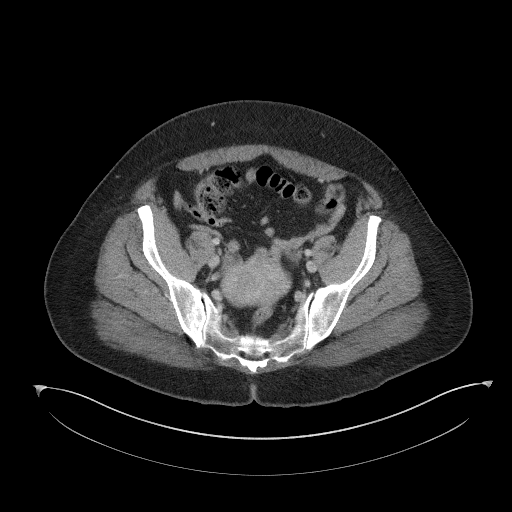
[im 38/109  soft-tissue]
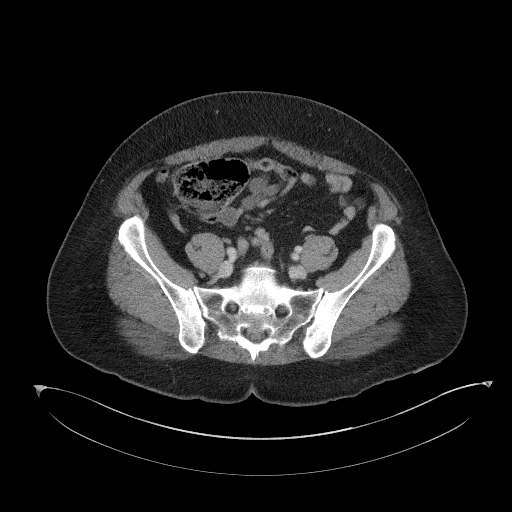
[im 49/109  soft-tissue]
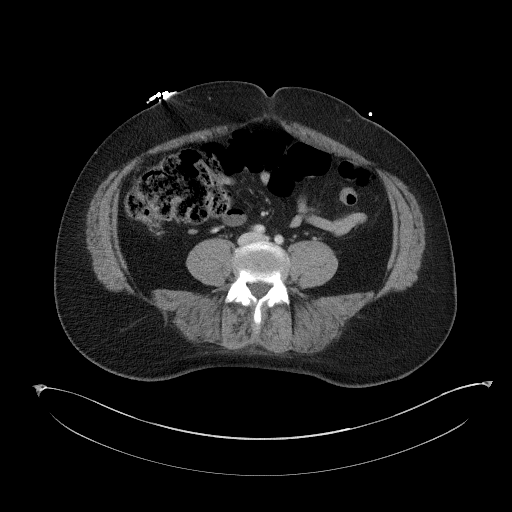
[im 55/109  soft-tissue]
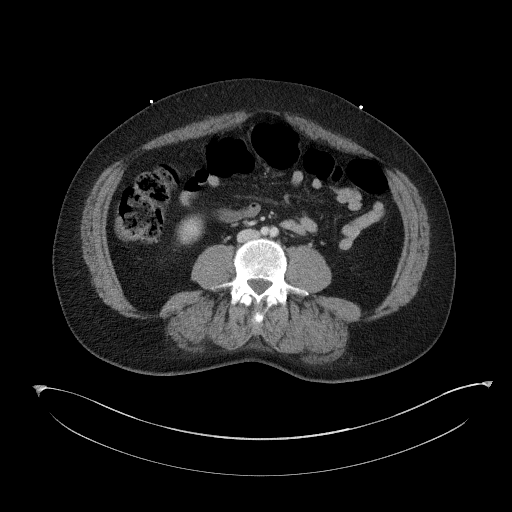
[im 60/109  soft-tissue]
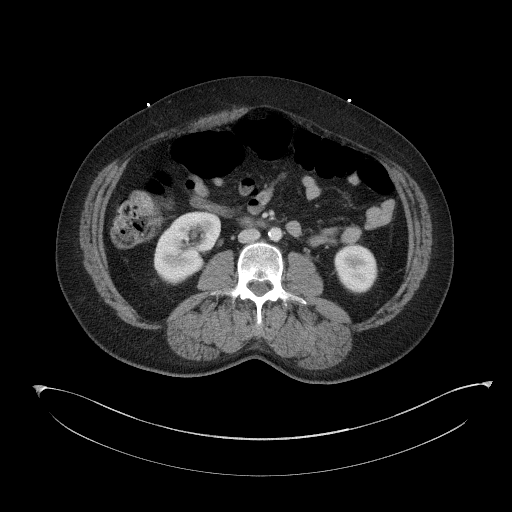
[im 71/109  soft-tissue]
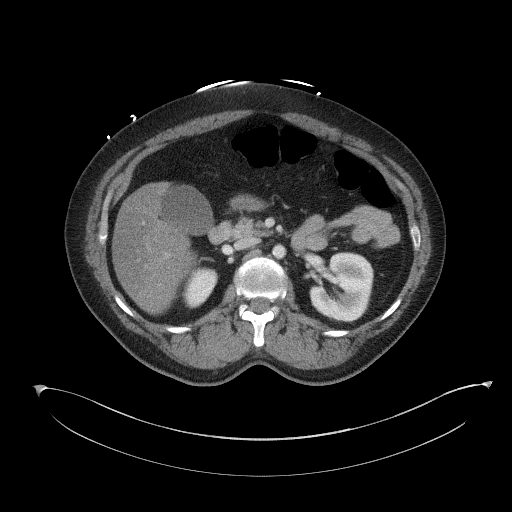
[im 71/109  bone]
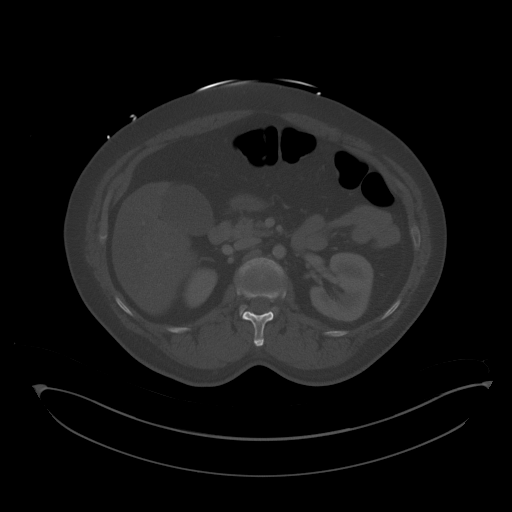
[im 76/109  soft-tissue]
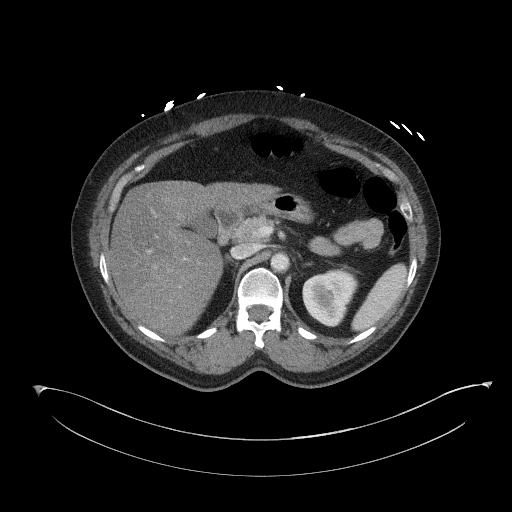
[im 87/109  soft-tissue]
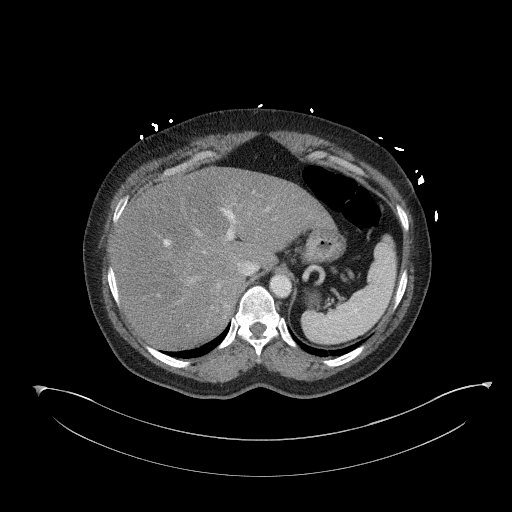
[im 92/109  soft-tissue]
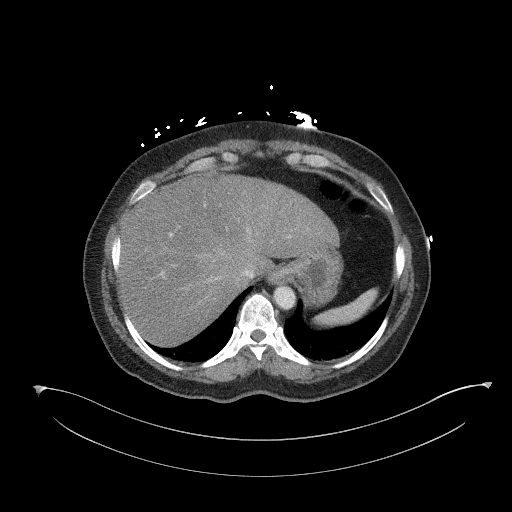
[im 103/109  soft-tissue]
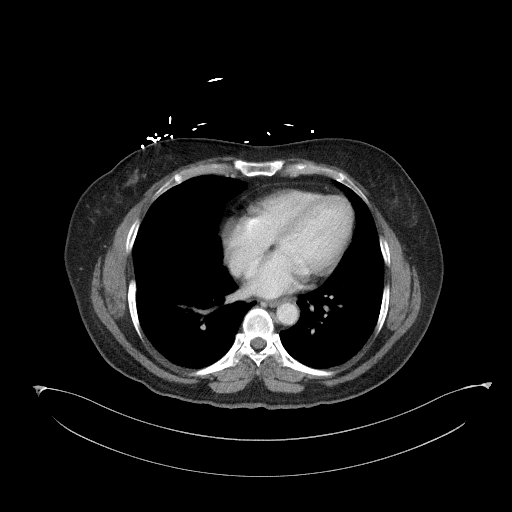

[Series 6: a/p w/ cor · coronal · 0.88mm/px · 3 of 150 slices shown]
[im 50/150  soft-tissue]
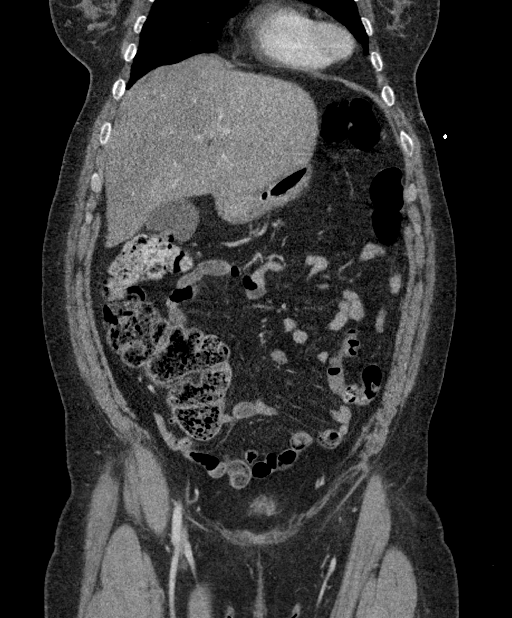
[im 67/150  soft-tissue]
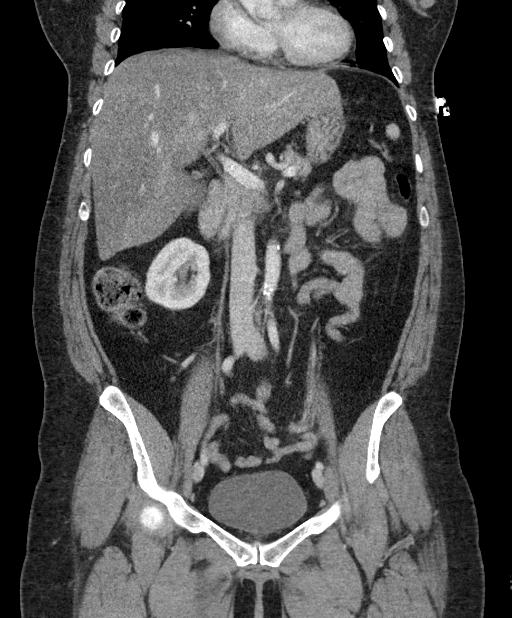
[im 83/150  soft-tissue]
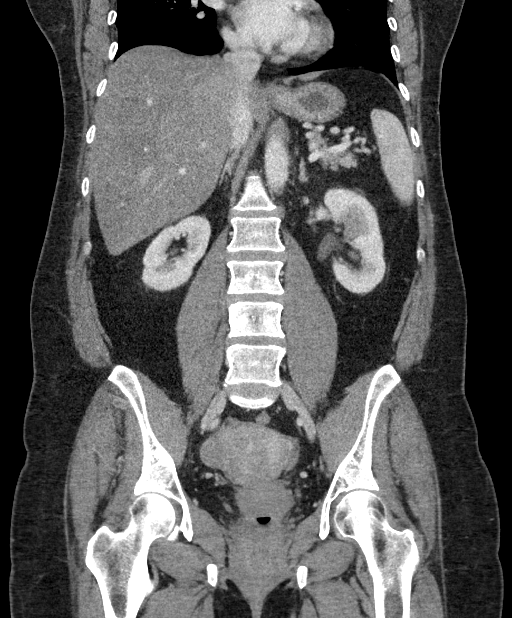

[16 of 46 positions shown; findings below may reference images not displayed]

FINDINGS: Lower chest: Negative.

Hepatobiliary: Hepatic steatosis.  Otherwise negative.

Pancreas: Unremarkable. No pancreatic ductal dilatation or
surrounding inflammatory changes.

Spleen: Normal in size without focal abnormality.

Adrenals/Urinary Tract: Normal adrenal glands. 4 mm cyst on the
lower pole the left kidney. Kidneys are otherwise normal. No
hydronephrosis. Ureters and bladder appear normal.

Stomach/Bowel: Stomach is within normal limits. Appendix appears
normal. No evidence of bowel wall thickening, distention, or
inflammatory changes.

Vascular/Lymphatic: Aortic atherosclerosis. No enlarged abdominal or
pelvic lymph nodes.

Reproductive: Small uterine fibroids.  Normal appearing ovaries.

Other: No abdominal wall hernia or abnormality. No abdominopelvic
ascites.

Musculoskeletal: No acute or significant osseous findings.
IMPRESSION: 1. No acute abnormality.
2. Hepatic steatosis.
3. Small uterine fibroids.
4. Aortic atherosclerosis.

## 2018-08-09 ENCOUNTER — Other Ambulatory Visit: Payer: Self-pay | Admitting: Physician Assistant

## 2018-08-09 DIAGNOSIS — E782 Mixed hyperlipidemia: Secondary | ICD-10-CM

## 2018-08-24 ENCOUNTER — Ambulatory Visit: Payer: Medicaid Other | Attending: Internal Medicine | Admitting: Internal Medicine

## 2018-08-24 ENCOUNTER — Encounter: Payer: Self-pay | Admitting: Internal Medicine

## 2018-08-24 VITALS — BP 145/84 | HR 66 | Temp 98.6°F | Resp 16 | Wt 208.8 lb

## 2018-08-24 DIAGNOSIS — E782 Mixed hyperlipidemia: Secondary | ICD-10-CM | POA: Diagnosis not present

## 2018-08-24 DIAGNOSIS — Z880 Allergy status to penicillin: Secondary | ICD-10-CM | POA: Insufficient documentation

## 2018-08-24 DIAGNOSIS — Z6832 Body mass index (BMI) 32.0-32.9, adult: Secondary | ICD-10-CM | POA: Diagnosis not present

## 2018-08-24 DIAGNOSIS — Z1211 Encounter for screening for malignant neoplasm of colon: Secondary | ICD-10-CM

## 2018-08-24 DIAGNOSIS — M47816 Spondylosis without myelopathy or radiculopathy, lumbar region: Secondary | ICD-10-CM | POA: Insufficient documentation

## 2018-08-24 DIAGNOSIS — F329 Major depressive disorder, single episode, unspecified: Secondary | ICD-10-CM | POA: Insufficient documentation

## 2018-08-24 DIAGNOSIS — M5136 Other intervertebral disc degeneration, lumbar region: Secondary | ICD-10-CM | POA: Diagnosis not present

## 2018-08-24 DIAGNOSIS — Z7984 Long term (current) use of oral hypoglycemic drugs: Secondary | ICD-10-CM | POA: Insufficient documentation

## 2018-08-24 DIAGNOSIS — E039 Hypothyroidism, unspecified: Secondary | ICD-10-CM | POA: Insufficient documentation

## 2018-08-24 DIAGNOSIS — Z79899 Other long term (current) drug therapy: Secondary | ICD-10-CM | POA: Diagnosis not present

## 2018-08-24 DIAGNOSIS — Z23 Encounter for immunization: Secondary | ICD-10-CM | POA: Diagnosis not present

## 2018-08-24 DIAGNOSIS — E119 Type 2 diabetes mellitus without complications: Secondary | ICD-10-CM

## 2018-08-24 DIAGNOSIS — E669 Obesity, unspecified: Secondary | ICD-10-CM | POA: Insufficient documentation

## 2018-08-24 DIAGNOSIS — G8929 Other chronic pain: Secondary | ICD-10-CM | POA: Diagnosis not present

## 2018-08-24 DIAGNOSIS — I10 Essential (primary) hypertension: Secondary | ICD-10-CM | POA: Diagnosis not present

## 2018-08-24 LAB — GLUCOSE, POCT (MANUAL RESULT ENTRY): POC GLUCOSE: 188 mg/dL — AB (ref 70–99)

## 2018-08-24 LAB — POCT GLYCOSYLATED HEMOGLOBIN (HGB A1C): HBA1C, POC (PREDIABETIC RANGE): 6.1 % (ref 5.7–6.4)

## 2018-08-24 MED ORDER — GLUCOSE BLOOD VI STRP: ORAL_STRIP | 12 refills | Status: DC

## 2018-08-24 MED ORDER — TRUE METRIX METER W/DEVICE KIT: PACK | 0 refills | Status: DC

## 2018-08-24 MED ORDER — LISINOPRIL 5 MG PO TABS: 5.0000 mg | ORAL_TABLET | Freq: Every day | ORAL | 3 refills | Status: DC

## 2018-08-24 MED ORDER — METHOCARBAMOL 500 MG PO TABS: 500.0000 mg | ORAL_TABLET | Freq: Three times a day (TID) | ORAL | 2 refills | Status: DC | PRN

## 2018-08-24 MED ORDER — TRUEPLUS LANCETS 28G MISC: 1 refills | Status: DC

## 2018-08-24 NOTE — Patient Instructions (Addendum)
Purchase and use Salonpos patches or cream over the counter.    Your blood pressure is not controlled.  We have added a low-dose of another blood pressure medicine called lisinopril.  Please take it once a day as prescribed. Influenza Virus Vaccine injection (Fluarix) What is this medicine? INFLUENZA VIRUS VACCINE (in floo EN zuh VAHY ruhs vak SEEN) helps to reduce the risk of getting influenza also known as the flu. This medicine may be used for other purposes; ask your health care provider or pharmacist if you have questions. COMMON BRAND NAME(S): Fluarix, Fluzone What should I tell my health care provider before I take this medicine? They need to know if you have any of these conditions: -bleeding disorder like hemophilia -fever or infection -Guillain-Barre syndrome or other neurological problems -immune system problems -infection with the human immunodeficiency virus (HIV) or AIDS -low blood platelet counts -multiple sclerosis -an unusual or allergic reaction to influenza virus vaccine, eggs, chicken proteins, latex, gentamicin, other medicines, foods, dyes or preservatives -pregnant or trying to get pregnant -breast-feeding How should I use this medicine? This vaccine is for injection into a muscle. It is given by a health care professional. A copy of Vaccine Information Statements will be given before each vaccination. Read this sheet carefully each time. The sheet may change frequently. Talk to your pediatrician regarding the use of this medicine in children. Special care may be needed. Overdosage: If you think you have taken too much of this medicine contact a poison control center or emergency room at once. NOTE: This medicine is only for you. Do not share this medicine with others. What if I miss a dose? This does not apply. What may interact with this medicine? -chemotherapy or radiation therapy -medicines that lower your immune system like etanercept, anakinra, infliximab,  and adalimumab -medicines that treat or prevent blood clots like warfarin -phenytoin -steroid medicines like prednisone or cortisone -theophylline -vaccines This list may not describe all possible interactions. Give your health care provider a list of all the medicines, herbs, non-prescription drugs, or dietary supplements you use. Also tell them if you smoke, drink alcohol, or use illegal drugs. Some items may interact with your medicine. What should I watch for while using this medicine? Report any side effects that do not go away within 3 days to your doctor or health care professional. Call your health care provider if any unusual symptoms occur within 6 weeks of receiving this vaccine. You may still catch the flu, but the illness is not usually as bad. You cannot get the flu from the vaccine. The vaccine will not protect against colds or other illnesses that may cause fever. The vaccine is needed every year. What side effects may I notice from receiving this medicine? Side effects that you should report to your doctor or health care professional as soon as possible: -allergic reactions like skin rash, itching or hives, swelling of the face, lips, or tongue Side effects that usually do not require medical attention (report to your doctor or health care professional if they continue or are bothersome): -fever -headache -muscle aches and pains -pain, tenderness, redness, or swelling at site where injected -weak or tired This list may not describe all possible side effects. Call your doctor for medical advice about side effects. You may report side effects to FDA at 1-800-FDA-1088. Where should I keep my medicine? This vaccine is only given in a clinic, pharmacy, doctor's office, or other health care setting and will not be stored at  home. NOTE: This sheet is a summary. It may not cover all possible information. If you have questions about this medicine, talk to your doctor, pharmacist, or  health care provider.  2018 Elsevier/Gold Standard (2008-05-04 09:30:40)

## 2018-08-24 NOTE — Progress Notes (Signed)
[id number]- [id number] 

## 2018-08-24 NOTE — Progress Notes (Signed)
Patient ID: [name], female    DOB: 09-19-1961  MRN: [id number]  CC: Diabetes   Subjective: [name] is a 57 y.o. female who presents for chronic ds management.  Her concerns today include:  HTN, HL, depression, hypothyroid, DM, tension HA, chronic LBP (minimal degen disc ds on MRI)  HM: she cancelled the c-scope. She did not tolerate the prep.  DM:  No meter to check BS.  Would like to get one. -Eating habits:  Drinks two [employer] a day. Admits that she eats a lot of bread, rice and pasta Exercise: not getting in much exercise due to chronic back pain -Med:  Compliant with Metformin  Chronic LBP:  She did not start with the chiropractor as yet. She is waiting to pay off other bills first -wanting refill on muscle relaxant and wanting to know if there is something stronger that she can take or use for pain.  She would prefer topical.  She is currently on meloxicam.  HTN:  Compliant with Norvasc and Propranolol. Checks BP 1-2 x/wk.  She does not recall the range.  "Sometimes it runs high depending on how much stress I had that day."  Limits salt in foods.   HL:  Reports compliance with Lipitor.  Thyroid:  Reports compliance with Levothyroxine.  She has gained 7 pounds since last visit.  TSH on last visit was normal.  Patient Active Problem List   Diagnosis Date Noted  . Spondylosis without myelopathy or radiculopathy, lumbar region 09/17/2017  . Essential hypertension 09/05/2017  . Chronic bilateral low back pain with left-sided sciatica 09/05/2017  . HTN (hypertension) 05/05/2017  . Elevated lactic acid level 05/05/2017  . Hypothyroidism 02/24/2017  . Mixed hyperlipidemia 02/24/2017  . 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 Sleep related headaches 01/23/2017  . Snoring 01/23/2017  . 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 Chronic headaches 10/30/2016  . Obesity 05/24/2016  . PMB (postmenopausal bleeding) 05/24/2016  . Back pain of lumbar  region with sciatica 09/28/2015     Current Outpatient Medications on File Prior to Visit  Medication Sig Dispense Refill  . acetaminophen (TYLENOL) 325 MG tablet Take 2 tablets (650 mg total) by mouth every 6 (six) hours as needed for moderate pain. 30 tablet   . amitriptyline (ELAVIL) 25 MG tablet Take 1 tablet (25 mg total) at bedtime by mouth. 30 tablet 5  . amLODipine (NORVASC) 10 MG tablet Take 1 tablet (10 mg total) by mouth daily. 90 tablet 3  . atorvastatin (LIPITOR) 40 MG tablet Take 1 tablet (40 mg total) by mouth daily. 90 tablet 3  . Cholecalciferol (VITAMIN D) 2000 units tablet Take 2,000 Units by mouth daily.    . divalproex (DEPAKOTE) 500 MG DR tablet Take one tablet in the morning and 2 at night (Patient taking differently: Take 500-1,000 mg by mouth See admin instructions. Take 537m in the morning and 10058mat night) 90 tablet 6  . escitalopram (LEXAPRO) 10 MG tablet Take 10 mg by mouth at bedtime.  2  . levothyroxine (SYNTHROID, LEVOTHROID) 50 MCG tablet Take 1 tablet (50 mcg total) by mouth daily. 30 tablet 0  . meloxicam (MOBIC) 15 MG tablet Take 1 tablet (15 mg total) by mouth daily. Take with food 30 tablet 6  . metFORMIN (GLUCOPHAGE) 500 MG tablet Take 1 tablet (500 mg total) by mouth 2 (two) times daily with a meal. 180 tablet 3  . Omega-3 Fatty Acids (FISH OIL) 1000  MG CAPS Take 1,000 mg by mouth 2 (two) times daily.    . propranolol (INDERAL) 80 MG tablet Take 1 tablet (80 mg total) by mouth 2 (two) times daily. 60 tablet 2   No current facility-administered medications on file prior to visit.     Allergies  Allergen Reactions  . Iron Diarrhea  . Penicillins Nausea And Vomiting    Has patient had a PCN reaction causing immediate rash, facial/tongue/throat swelling, SOB or lightheadedness with hypotension: No Has patient had a PCN reaction causing severe rash involving mucus membranes or skin necrosis: No Has patient had a PCN reaction that required  hospitalization No Has patient had a PCN reaction occurring within the last 10 years: No If all of the above answers are "NO", then may proceed with Cephalosporin use.    Social History   Socioeconomic History  . Marital status: Single    Spouse name: Not on file  . Number of children: 1  . Years of education: 7  . Highest education level: Not on file  Occupational History  . Occupation: Unemployed  Social Needs  . Financial resource strain: Not on file  . Food insecurity:    Worry: Not on file    Inability: Not on file  . Transportation needs:    Medical: Not on file    Non-medical: Not on file  Tobacco Use  . Smoking status: Never Smoker  . Smokeless tobacco: Never Used  Substance and Sexual Activity  . Alcohol use: No    Alcohol/week: 0.0 standard drinks  . Drug use: No  . Sexual activity: Not Currently    Partners: Male  Lifestyle  . Physical activity:    Days per week: Not on file    Minutes per session: Not on file  . Stress: Not on file  Relationships  . Social connections:    Talks on phone: Not on file    Gets together: Not on file    Attends religious service: Not on file    Active member of club or organization: Not on file    Attends meetings of clubs or organizations: Not on file    Relationship status: Not on file  . Intimate partner violence:    Fear of current or ex partner: Not on file    Emotionally abused: Not on file    Physically abused: Not on file    Forced sexual activity: Not on file  Other Topics Concern  . Not on file  Social History Narrative   Lives with son, [name].   Caffeine use: Daily       Left handed    Family History  Problem Relation Age of Onset  . Headache Neg Hx   . Colon cancer Neg Hx   . Esophageal cancer Neg Hx   . Rectal cancer Neg Hx   . Stomach cancer Neg Hx   . Breast cancer Neg Hx     Past Surgical History:  Procedure Laterality Date  . BREAST CYST EXCISION Right 1980  . BREAST CYST EXCISION Left  1985  . NO PAST SURGERIES      ROS: Review of Systems Negative except as above PHYSICAL EXAM: BP (!) 145/84   Pulse 66   Temp 98.6 F (37 C) (Oral)   Resp 16   Wt 208 lb 12.8 oz (94.7 kg)   LMP 06/06/2015   SpO2 97%   BMI 32.22 kg/m   Wt Readings from Last 3 Encounters:  08/24/18 208 lb  12.8 oz (94.7 kg)  05/26/18 201 lb 3.2 oz (91.3 kg)  05/22/18 200 lb 3.2 oz (90.8 kg)    Physical Exam  General appearance - alert, well appearing, and in no distress Mental status - normal mood, behavior, speech, dress, motor activity, and thought processes Neck - supple, no significant adenopathy Chest - clear to auscultation, no wheezes, rales or rhonchi, symmetric air entry Heart - normal rate, regular rhythm, normal S1, S2, no murmurs, rubs, clicks or gallops Musculoskeletal -no tenderness on palpation of the LS spine.  Gait is steady. Extremities - no LE edema  BS 188/A1C 6.1  ASSESSMENT AND PLAN:  1. Controlled type 2 diabetes mellitus without complication, without long-term current use of insulin (HCC) At goal.  Continue metformin. Dietary counseling given.  Encouraged her to get rid of the sugary drinks and cut back on white carbohydrates.  She has set goal to cut back on [facility] to just 1 can a day. - POCT glucose (manual entry) - POCT glycosylated hemoglobin (Hb A1C)  2. Essential hypertension Not at goal.  Lisinopril 5 mg daily.  3. Colon cancer screening Discussed colon cancer screening methods.  She is willing to do the fit test. - Fecal occult blood, imunochemical([facility])  4. Degenerative disc disease, lumbar Continue meloxicam.  Refill given on Robaxin.  I recommend trying over-the-counter Salon Pos patches  5. Need for influenza vaccination   6. Need for shingles vaccine   Patient was given the opportunity to ask questions.  Patient verbalized understanding of the plan and was able to repeat key elements of the plan.   Orders Placed This  Encounter  Procedures  . Fecal occult blood, imunochemical([facility])  . Flu Vaccine QUAD 36+ mos IM  . Varicella-zoster vaccine IM  . POCT glucose (manual entry)  . POCT glycosylated hemoglobin (Hb A1C)     Requested Prescriptions   Signed Prescriptions Disp Refills  . methocarbamol (ROBAXIN) 500 MG tablet 60 tablet 2    Sig: Take 1 tablet (500 mg total) by mouth every 8 (eight) hours as needed for muscle spasms.  Marland Kitchen lisinopril (PRINIVIL,ZESTRIL) 5 MG tablet 90 tablet 3    Sig: Take 1 tablet (5 mg total) by mouth daily.  . Blood Glucose Monitoring Suppl (TRUE METRIX METER) w/Device KIT 1 kit 0    Sig: Use as directed  . glucose blood (TRUE METRIX BLOOD GLUCOSE TEST) test strip 100 each 12    Sig: Use as instructed  . TRUEPLUS LANCETS 28G MISC 100 each 1    Sig: Use as directed to check blood sugars once daily    Return in about 4 months (around 12/23/2018).  Karle Plumber, MD, FACP

## 2018-08-28 ENCOUNTER — Other Ambulatory Visit: Payer: Self-pay

## 2018-08-28 ENCOUNTER — Encounter: Payer: Self-pay | Admitting: Internal Medicine

## 2018-08-28 MED ORDER — ACCU-CHEK AVIVA PLUS W/DEVICE KIT: PACK | 0 refills | Status: DC

## 2018-08-28 MED ORDER — ACCU-CHEK SOFT TOUCH LANCETS MISC: 12 refills | Status: DC

## 2018-08-28 MED ORDER — GLUCOSE BLOOD VI STRP: ORAL_STRIP | 12 refills | Status: DC

## 2018-08-28 MED ORDER — ACCU-CHEK SOFTCLIX LANCET DEV KIT: PACK | 0 refills | Status: DC

## 2018-09-07 ENCOUNTER — Other Ambulatory Visit: Payer: Self-pay | Admitting: Family Medicine

## 2018-09-07 DIAGNOSIS — E039 Hypothyroidism, unspecified: Secondary | ICD-10-CM

## 2018-09-30 ENCOUNTER — Other Ambulatory Visit: Payer: Self-pay | Admitting: Internal Medicine

## 2018-09-30 DIAGNOSIS — I10 Essential (primary) hypertension: Secondary | ICD-10-CM

## 2018-10-19 ENCOUNTER — Other Ambulatory Visit: Payer: Self-pay | Admitting: Physician Assistant

## 2018-10-19 ENCOUNTER — Other Ambulatory Visit: Payer: Self-pay | Admitting: Internal Medicine

## 2018-10-19 DIAGNOSIS — I10 Essential (primary) hypertension: Secondary | ICD-10-CM

## 2018-10-20 ENCOUNTER — Encounter: Payer: Self-pay | Admitting: Internal Medicine

## 2018-10-23 ENCOUNTER — Other Ambulatory Visit: Payer: Self-pay | Admitting: Internal Medicine

## 2018-10-23 DIAGNOSIS — E039 Hypothyroidism, unspecified: Secondary | ICD-10-CM

## 2018-11-05 ENCOUNTER — Ambulatory Visit (HOSPITAL_COMMUNITY)
Admission: RE | Admit: 2018-11-05 | Discharge: 2018-11-05 | Disposition: A | Discharge status: Home / Self Care | Payer: Medicaid Other | Source: Ambulatory Visit | Attending: Internal Medicine | Admitting: Internal Medicine

## 2018-11-05 ENCOUNTER — Encounter: Payer: Self-pay | Admitting: Internal Medicine

## 2018-11-05 DIAGNOSIS — M5440 Lumbago with sciatica, unspecified side: Secondary | ICD-10-CM | POA: Diagnosis not present

## 2018-11-05 NOTE — Telephone Encounter (Signed)
Mychart request

## 2018-11-07 ENCOUNTER — Encounter: Payer: Self-pay | Admitting: Internal Medicine

## 2018-11-08 ENCOUNTER — Encounter: Payer: Self-pay | Admitting: Internal Medicine

## 2018-11-16 ENCOUNTER — Encounter: Payer: Self-pay | Admitting: Internal Medicine

## 2018-11-23 ENCOUNTER — Telehealth: Payer: Self-pay | Admitting: Internal Medicine

## 2018-11-23 NOTE — Telephone Encounter (Signed)
Returned pt call and informed her that I have faxed the xray results multiple times and I have received a transmission log stating it went through. Informed pt that she can come by the office to pick up a copy and take with her to her appointment

## 2018-11-23 NOTE — Telephone Encounter (Signed)
Will route to Nurse to follow up.

## 2018-11-23 NOTE — Telephone Encounter (Signed)
Pt called to inform that the chiropractor Margo Aye said they did not receive the xrays we faxed. Pt request we refax those please. Original correspondence is in pt MyChart messages.

## 2018-12-17 ENCOUNTER — Other Ambulatory Visit: Payer: Self-pay | Admitting: Physician Assistant

## 2018-12-17 ENCOUNTER — Other Ambulatory Visit: Payer: Self-pay | Admitting: Internal Medicine

## 2018-12-21 ENCOUNTER — Other Ambulatory Visit: Payer: Self-pay | Admitting: Internal Medicine

## 2018-12-21 DIAGNOSIS — I10 Essential (primary) hypertension: Secondary | ICD-10-CM

## 2018-12-22 ENCOUNTER — Encounter: Payer: Self-pay | Admitting: Internal Medicine

## 2018-12-24 ENCOUNTER — Ambulatory Visit: Payer: Medicaid Other | Admitting: Internal Medicine

## 2018-12-29 ENCOUNTER — Other Ambulatory Visit: Payer: Self-pay | Admitting: Internal Medicine

## 2018-12-29 DIAGNOSIS — E039 Hypothyroidism, unspecified: Secondary | ICD-10-CM

## 2019-01-08 ENCOUNTER — Other Ambulatory Visit: Payer: Self-pay | Admitting: Internal Medicine

## 2019-01-08 DIAGNOSIS — E039 Hypothyroidism, unspecified: Secondary | ICD-10-CM

## 2019-01-20 ENCOUNTER — Other Ambulatory Visit: Payer: Self-pay | Admitting: Internal Medicine

## 2019-01-25 ENCOUNTER — Encounter: Payer: Self-pay | Admitting: Internal Medicine

## 2019-01-26 ENCOUNTER — Encounter: Payer: Self-pay | Admitting: Internal Medicine

## 2019-01-26 ENCOUNTER — Other Ambulatory Visit: Payer: Self-pay

## 2019-01-26 ENCOUNTER — Ambulatory Visit: Payer: Medicaid Other | Attending: Internal Medicine | Admitting: Internal Medicine

## 2019-01-26 DIAGNOSIS — G8929 Other chronic pain: Secondary | ICD-10-CM

## 2019-01-26 DIAGNOSIS — I10 Essential (primary) hypertension: Secondary | ICD-10-CM

## 2019-01-26 DIAGNOSIS — E119 Type 2 diabetes mellitus without complications: Secondary | ICD-10-CM | POA: Diagnosis not present

## 2019-01-26 DIAGNOSIS — M545 Low back pain: Secondary | ICD-10-CM | POA: Diagnosis not present

## 2019-01-26 DIAGNOSIS — Z1211 Encounter for screening for malignant neoplasm of colon: Secondary | ICD-10-CM

## 2019-01-26 DIAGNOSIS — E039 Hypothyroidism, unspecified: Secondary | ICD-10-CM

## 2019-01-26 DIAGNOSIS — Z760 Encounter for issue of repeat prescription: Secondary | ICD-10-CM

## 2019-01-26 MED ORDER — AMITRIPTYLINE HCL 25 MG PO TABS: 25.0000 mg | ORAL_TABLET | Freq: Every day | ORAL | 5 refills | Status: DC

## 2019-01-26 MED ORDER — LISINOPRIL 10 MG PO TABS: 10.0000 mg | ORAL_TABLET | Freq: Every day | ORAL | 1 refills | Status: DC

## 2019-01-26 NOTE — Progress Notes (Signed)
Virtual Visit via Telephone Note  I connected with [name] on 01/26/19 at 11:22 a.m by telephone from my office and verified that I am speaking with the correct person using two identifiers.  Pt is at home.  There was no other person participating in this call other than myself on the patient.   I discussed the limitations, risks, security and privacy concerns of performing an evaluation and management service by telephone and the availability of in person appointments. I also discussed with the patient that there may be a patient responsible charge related to this service. The patient expressed understanding and agreed to proceed.   History of Present Illness: HTN, HL, depression, hypothyroid, DM, tension HA, chronic LBP (minimal degen disc ds on MRI)   Chronic LBP:  Using the Salonpos [place] which helps.  Still takes Mobic X-ray lumbar spine done 10/2018 revealed no spondylosis and disc spaces appear unremarkable; no facet arthropathy. No lower seeing chiropractor.  He referred her to [facility] but not able to see them until she gets her Medicaid change  DM: checks BS QOD.  Gives range 101-116 Compliant with Metformin Eating habits: reports she is doing pretty good with her eating habits. She drinks mainly water and milk Walking 2 x a wk for exercise  HTN: reports compliance with Norvasc, Propranolol and Lisinopril.  Limits salt in foods Checks BP 2 x a day.   Some readings 140/94, 127/85, 137/101, 136/63  Hypothyroid:  Compliant with Levothyroxine.  No palpitations or heat/cold intolerance. Thinks she has gained wgh  HM:  Still has not use the FIT as yet but still has it at home  Observations/Objective: No direct observations as this was a telephone encounter  Assessment and Plan: 1. Controlled type 2 diabetes mellitus without complication, without long-term current use of insulin ([place]) Home blood sugar readings are at goal.  Patient to continue metformin and healthy  eating habits.  Encouraged her to continue walking for exercise - CBC; Future - Lipid panel; Future - Comprehensive metabolic panel; Future  2. Essential hypertension Home blood pressure readings are not at goal of 130/80 or lower.  Recommend increase lisinopril to 10 mg daily.  Continue DASH diet - lisinopril (PRINIVIL,ZESTRIL) 10 MG tablet; Take 1 tablet (10 mg total) by mouth daily.  Dispense: 90 tablet; Refill: 1  3. Chronic low back pain, unspecified back pain laterality, unspecified whether sciatica present Recommend stretching exercises every morning.  Continue meloxicam.  Continue Salonpos PRN  4. Hypothyroidism, unspecified type Continue levothyroxine. - TSH; Future  5. Medication refill Patient requested refill on amitriptyline - amitriptyline (ELAVIL) 25 MG tablet; Take 1 tablet (25 mg total) by mouth at bedtime.  Dispense: 30 tablet; Refill: 5  6. Colon cancer screening Encourage patient to complete the fit test.  She plans to do so   Follow Up Instructions: F/u 2 mths   I discussed the assessment and treatment plan with the patient. The patient was provided an opportunity to ask questions and all were answered. The patient agreed with the plan and demonstrated an understanding of the instructions.   The patient was advised to call back or seek an in-person evaluation if the symptoms worsen or if the condition fails to improve as anticipated.  I provided 15 minutes of non-face-to-face time during this encounter.   Karle Plumber, MD

## 2019-01-26 NOTE — Telephone Encounter (Signed)
Patient mychart [name]

## 2019-02-04 ENCOUNTER — Other Ambulatory Visit: Payer: Self-pay | Admitting: Physician Assistant

## 2019-02-04 DIAGNOSIS — I10 Essential (primary) hypertension: Secondary | ICD-10-CM

## 2019-03-05 ENCOUNTER — Other Ambulatory Visit: Payer: Self-pay | Admitting: Internal Medicine

## 2019-03-05 DIAGNOSIS — I10 Essential (primary) hypertension: Secondary | ICD-10-CM

## 2019-03-09 ENCOUNTER — Other Ambulatory Visit: Payer: Self-pay | Admitting: Internal Medicine

## 2019-03-16 ENCOUNTER — Other Ambulatory Visit: Payer: Self-pay | Admitting: Physician Assistant

## 2019-03-22 ENCOUNTER — Other Ambulatory Visit: Payer: Self-pay | Admitting: Internal Medicine

## 2019-03-22 DIAGNOSIS — E039 Hypothyroidism, unspecified: Secondary | ICD-10-CM

## 2019-04-13 ENCOUNTER — Other Ambulatory Visit: Payer: Self-pay | Admitting: Internal Medicine

## 2019-04-28 ENCOUNTER — Encounter: Payer: Self-pay | Admitting: Internal Medicine

## 2019-04-28 NOTE — Telephone Encounter (Signed)
Please contact patient and reschedule her appointment.

## 2019-04-30 ENCOUNTER — Encounter: Payer: Self-pay | Admitting: Internal Medicine

## 2019-04-30 NOTE — Telephone Encounter (Signed)
Please schedule next available as a televisit.

## 2019-05-04 ENCOUNTER — Other Ambulatory Visit: Payer: Self-pay

## 2019-05-04 ENCOUNTER — Encounter: Payer: Self-pay | Admitting: Internal Medicine

## 2019-05-04 ENCOUNTER — Ambulatory Visit: Payer: Medicaid Other | Attending: Internal Medicine | Admitting: Internal Medicine

## 2019-05-04 DIAGNOSIS — E039 Hypothyroidism, unspecified: Secondary | ICD-10-CM | POA: Diagnosis not present

## 2019-05-04 DIAGNOSIS — E119 Type 2 diabetes mellitus without complications: Secondary | ICD-10-CM | POA: Diagnosis not present

## 2019-05-04 DIAGNOSIS — E782 Mixed hyperlipidemia: Secondary | ICD-10-CM

## 2019-05-04 DIAGNOSIS — I10 Essential (primary) hypertension: Secondary | ICD-10-CM | POA: Diagnosis not present

## 2019-05-04 DIAGNOSIS — Z1211 Encounter for screening for malignant neoplasm of colon: Secondary | ICD-10-CM

## 2019-05-04 MED ORDER — ACCU-CHEK SOFT TOUCH LANCETS MISC: 12 refills | Status: DC

## 2019-05-04 MED ORDER — BLOOD PRESSURE MONITOR DEVI: 0 refills | Status: DC

## 2019-05-04 MED ORDER — ACCU-CHEK SOFTCLIX LANCET DEV KIT: PACK | 0 refills | Status: DC

## 2019-05-04 MED ORDER — ACCU-CHEK AVIVA VI STRP: ORAL_STRIP | 12 refills | Status: DC

## 2019-05-04 NOTE — Patient Instructions (Signed)

## 2019-05-04 NOTE — Progress Notes (Signed)
Pt states her back has been giving her issues lately   Pt states she has been taking ibuprofen. Pt states it helps but it wears off   Pt states she misplaced her glucometer

## 2019-05-04 NOTE — Progress Notes (Signed)
Virtual Visit via Telephone Note Due to current restrictions/limitations of in-office visits due to the COVID-19 pandemic, this scheduled clinical appointment was converted to a telehealth visit  I connected with [name] on 05/04/19 at 8:43 a.m by telephone and verified that I am speaking with the correct person using two identifiers. I am in my office.  The patient is at home.  Only the patient and myself participated in this encounter.  I discussed the limitations, risks, security and privacy concerns of performing an evaluation and management service by telephone and the availability of in person appointments. I also discussed with the patient that there may be a patient responsible charge related to this service. The patient expressed understanding and agreed to proceed.  History of Present Illness: HTN, HL, depression, hypothyroid, DM, tension HA, chronic LBP (minimal degen disc ds on MRI).  Last eval 01/2019  HYPERTENSION Currently taking: see medication list Med Adherence: _0  Yes - Lisinopril increased on last visit    _1  No Medication side effects: _2  Yes    _3  No Adherence with salt restriction: _4  Yes    _5  No Home Monitoring?:  Monitor no longer works   Monitoring Frequency:  Home BP results range: _6  Yes    _7  No SOB? _8  Yes    _9  No Chest Pain?: _10  Yes    _11  No Leg swelling?: _12  Yes    _13  No Headaches?: _14  Yes - occasionally but doing good on Elavil    Dizziness? _15  Yes    _16  No Comments:  Labs ordered on last visit, she did not come to have labs done as yet.  Lack of transportation  DIABETES TYPE 2 Last A1C:   Results for orders placed or performed in visit on 08/24/18  POCT glucose (manual entry)  Result Value Ref Range   POC Glucose 188 (A) 70 - 99 mg/dl  POCT glycosylated hemoglobin (Hb A1C)  Result Value Ref Range   Hemoglobin A1C     HbA1c POC (<> result, manual entry)     HbA1c, POC (prediabetic range) 6.1 5.7 - 6.4 %   HbA1c, POC (controlled  diabetic range)      Med Adherence:  _17  Yes    _18  No Medication side effects:  _19  Yes    _20  No Home Monitoring?  _21  Yes    _22  No - misplaced meter Home glucose results range: Diet Adherence: _23  Yes    _24  No Exercise: _25  Yes    _26  No - "its been so hot outside."   Hypoglycemic episodes?: _27  Yes    _28  No Numbness of the feet? _29  Yes    _30  No Retinopathy hx? _31  Yes    _32  No Last eye exam:  Over due for eye exam.  Some blurred vision. Comments:   Hypothyroid:  Taking Levothyroxine as prescribed.  No feeling hot or cold all the time.  No constipation/diarrhea  HL: tolerating Lipitor  HM:  Needs to turn in FIT test   Outpatient Encounter Medications as of 05/04/2019  Medication Sig  . acetaminophen (TYLENOL) 325 MG tablet Take 2 tablets (650 mg total) by mouth every 6 (six) hours as needed for moderate pain.  Marland Kitchen amitriptyline (ELAVIL) 25 MG tablet Take 1 tablet (25 mg total) by mouth at bedtime.  Marland Kitchen amLODipine (NORVASC) 10 MG tablet Take 1 tablet (10 mg total) by mouth daily.  Marland Kitchen atorvastatin (LIPITOR) 40 MG tablet Take 1 tablet (40 mg total) by mouth daily.  . Blood  Glucose Monitoring Suppl (ACCU-CHEK AVIVA PLUS) w/Device KIT USE AS DIRECTED  . Cholecalciferol (VITAMIN D) 2000 units tablet Take 2,000 Units by mouth daily.  . divalproex (DEPAKOTE) 500 MG DR tablet Take one tablet in the morning and 2 at night (Patient taking differently: Take 500-1,000 mg by mouth See admin instructions. Take 531m in the morning and 10090mat night)  . escitalopram (LEXAPRO) 10 MG tablet Take 10 mg by mouth at bedtime.  . Marland Kitchenlucose blood (ACCU-CHEK AVIVA) test strip Check blood sugar fasting and before meals and again if pt feels bad (symptoms of hypo).  . Lancets (ACCU-CHEK SOFT TOUCH) lancets Check blood sugar fasting and before meals and again if pt feels bad (symptoms of hypo).  . Lancets Misc. (ACCU-CHEK SOFTCLIX LANCET DEV) KIT Check blood sugar fasting and before meals and again if pt feels bad  (symptoms of hypo).  . Marland Kitchenevothyroxine (SYNTHROID) 50 MCG tablet Take 1 tablet (50 mcg total) by mouth daily.  . Marland Kitchenisinopril (PRINIVIL,ZESTRIL) 10 MG tablet Take 1 tablet (10 mg total) by mouth daily.  . meloxicam (MOBIC) 15 MG tablet Take 1 tablet (15 mg total) by mouth daily. Take with food  . metFORMIN (GLUCOPHAGE) 500 MG tablet Take 1 tablet (500 mg total) by mouth 2 (two) times daily with a meal.  . methocarbamol (ROBAXIN) 500 MG tablet Take 1 tablet (500 mg total) by mouth every 8 (eight) hours as needed for muscle spasms.  . Omega-3 Fatty Acids (FISH OIL) 1000 MG CAPS Take 1,000 mg by mouth 2 (two) times daily.  . propranolol (INDERAL) 80 MG tablet Take 1 tablet (80 mg total) by mouth 2 (two) times daily.  . TRUEPLUS LANCETS 28G MISC Use as directed to check blood sugars once daily   No facility-administered encounter medications on file as of 05/04/2019.     Observations/Objective: No direct observation done as this was a telephone encounter.  Assessment and Plan: 1. Controlled type 2 diabetes mellitus without complication, without long-term current use of insulin ([place] to come to the lab whenever she gets transportation to do the blood test as ordered on last visit.  Prescription sent to her pharmacy for diabetic testing supplies.  She will continue metformin - Ambulatory referral to Ophthalmology - glucose blood (ACCU-CHEK AVIVA) test strip; Check blood sugar fasting and before meals and again if pt feels bad (symptoms of hypo).  Dispense: 100 each; Refill: 12 - Lancets (ACCU-CHEK SOFT TOUCH) lancets; Check blood sugar fasting and before meals and again if pt feels bad (symptoms of hypo).  Dispense: 100 each; Refill: 12 - Lancets Misc. (ACCU-CHEK SOFTCLIX LANCET DEV) KIT; Check blood sugar fasting and before meals and again if pt feels bad (symptoms of hypo).  Dispense: 1 kit; Refill: 0  2. Essential hypertension She will continue current medications and low-salt  diet.  Prescription sent to her pharmacy for blood pressure monitoring device.  Patient told that goal is 130/80 or lower.  Should check blood pressure at least twice a week - Blood Pressure Monitor DEVI; Use as directed to check home blood pressure 2-3 times a week  Dispense: 1 Device; Refill: 0  3. Hypothyroidism, unspecified type Continue levothyroxine  4. Colon cancer screening Encourage her to use an turn in the fit test  5. Mixed hyperlipidemia Continue atorvastatin   Follow Up Instructions: F/u in 3 mths   I discussed the assessment and treatment plan with the patient. The patient was provided an opportunity to ask questions and all were  answered. The patient agreed with the plan and demonstrated an understanding of the instructions.   The patient was advised to call back or seek an in-person evaluation if the symptoms worsen or if the condition fails to improve as anticipated.  I provided 9 minutes of non-face-to-face time during this encounter.   Karle Plumber, MD

## 2019-05-05 ENCOUNTER — Other Ambulatory Visit: Payer: Self-pay | Admitting: Internal Medicine

## 2019-05-05 DIAGNOSIS — I10 Essential (primary) hypertension: Secondary | ICD-10-CM

## 2019-05-11 ENCOUNTER — Telehealth: Payer: Self-pay

## 2019-05-11 NOTE — Telephone Encounter (Signed)
Talked with patient's son Shanon Brow and advised him that patient has to sign a medical release form for medical records to be faxed to another doctor's office.  Stated that he understands and that he would let the patient know.

## 2019-05-17 ENCOUNTER — Telehealth: Payer: Self-pay | Admitting: Internal Medicine

## 2019-05-17 NOTE — Telephone Encounter (Signed)
-----   Message from Carilyn Goodpasture, RN sent at 05/17/2019  1:10 PM EDT ----- Please schedule a sooner appointment- televisit with Dr. Wynetta Emery.

## 2019-05-17 NOTE — Telephone Encounter (Signed)
Attempted to reach patient no answer unable to LVM

## 2019-05-22 ENCOUNTER — Encounter: Payer: Self-pay | Admitting: Internal Medicine

## 2019-05-26 ENCOUNTER — Other Ambulatory Visit: Payer: Self-pay

## 2019-05-26 ENCOUNTER — Telehealth: Payer: Self-pay | Admitting: Internal Medicine

## 2019-05-26 DIAGNOSIS — I10 Essential (primary) hypertension: Secondary | ICD-10-CM

## 2019-05-26 MED ORDER — ACCU-CHEK GUIDE W/DEVICE KIT: 1.0000 | PACK | Freq: Three times a day (TID) | 0 refills | Status: DC

## 2019-05-26 NOTE — Telephone Encounter (Signed)
-----   Message from Jackelyn Knife, [place] sent at 05/26/2019  9:47 AM EDT ----- Regarding: RX for blood pressure device Dr. Wynetta Emery Pt sent a Mychart message stating that her insurance will not pay for the blood pressure device through her pharmacy. Would you be able to write a rx so I can fax it to [facility]

## 2019-05-31 ENCOUNTER — Other Ambulatory Visit: Payer: Self-pay | Admitting: Internal Medicine

## 2019-05-31 DIAGNOSIS — I10 Essential (primary) hypertension: Secondary | ICD-10-CM

## 2019-06-04 ENCOUNTER — Other Ambulatory Visit: Payer: Self-pay | Admitting: Internal Medicine

## 2019-06-04 DIAGNOSIS — Z760 Encounter for issue of repeat prescription: Secondary | ICD-10-CM

## 2019-06-04 DIAGNOSIS — E039 Hypothyroidism, unspecified: Secondary | ICD-10-CM

## 2019-06-08 NOTE — Telephone Encounter (Signed)
rx has been faxed to [facility]

## 2019-06-13 ENCOUNTER — Encounter: Payer: Self-pay | Admitting: Internal Medicine

## 2019-06-17 ENCOUNTER — Encounter: Payer: Self-pay | Admitting: Internal Medicine

## 2019-06-26 ENCOUNTER — Encounter: Payer: Self-pay | Admitting: Internal Medicine

## 2019-07-02 ENCOUNTER — Encounter: Payer: Self-pay | Admitting: Internal Medicine

## 2019-07-02 ENCOUNTER — Other Ambulatory Visit: Payer: Self-pay | Admitting: Pharmacist

## 2019-07-02 DIAGNOSIS — E119 Type 2 diabetes mellitus without complications: Secondary | ICD-10-CM

## 2019-07-02 MED ORDER — ACCU-CHEK FASTCLIX LANCETS MISC: 6 refills | Status: DC

## 2019-07-02 MED ORDER — ACCU-CHEK GUIDE VI STRP: ORAL_STRIP | 6 refills | Status: DC

## 2019-07-02 MED ORDER — ACCU-CHEK GUIDE ME W/DEVICE KIT: 1.0000 | PACK | Freq: Every day | 0 refills | Status: DC

## 2019-07-02 NOTE — Telephone Encounter (Signed)
Please re call in this meter to [facility]

## 2019-07-08 ENCOUNTER — Encounter: Payer: Self-pay | Admitting: Internal Medicine

## 2019-07-12 ENCOUNTER — Other Ambulatory Visit: Payer: Self-pay | Admitting: Internal Medicine

## 2019-08-04 ENCOUNTER — Encounter: Payer: Self-pay | Admitting: Internal Medicine

## 2019-08-06 ENCOUNTER — Ambulatory Visit: Payer: Medicaid Other | Admitting: Internal Medicine

## 2019-08-30 ENCOUNTER — Other Ambulatory Visit: Payer: Self-pay | Admitting: Internal Medicine

## 2019-08-30 DIAGNOSIS — Z760 Encounter for issue of repeat prescription: Secondary | ICD-10-CM

## 2019-09-21 ENCOUNTER — Other Ambulatory Visit: Payer: Self-pay | Admitting: Internal Medicine

## 2019-09-21 DIAGNOSIS — I10 Essential (primary) hypertension: Secondary | ICD-10-CM

## 2019-09-21 DIAGNOSIS — Z760 Encounter for issue of repeat prescription: Secondary | ICD-10-CM

## 2019-09-22 NOTE — Telephone Encounter (Signed)
Can refill for 30 days only with no refills pt will need to have a televisit before she runs out!

## 2019-09-22 NOTE — Telephone Encounter (Signed)
Patient was last seen in July of this year. Has a televisit scheduled in October but cancelled. Is this okay for [place] to refill?

## 2019-09-24 ENCOUNTER — Encounter: Payer: Self-pay | Admitting: Internal Medicine

## 2019-09-24 DIAGNOSIS — F331 Major depressive disorder, recurrent, moderate: Secondary | ICD-10-CM | POA: Diagnosis not present

## 2019-10-04 ENCOUNTER — Other Ambulatory Visit: Payer: Self-pay | Admitting: Internal Medicine

## 2019-10-04 DIAGNOSIS — I10 Essential (primary) hypertension: Secondary | ICD-10-CM

## 2019-10-04 DIAGNOSIS — Z760 Encounter for issue of repeat prescription: Secondary | ICD-10-CM

## 2019-10-23 ENCOUNTER — Encounter: Payer: Self-pay | Admitting: Internal Medicine

## 2019-10-25 DIAGNOSIS — F331 Major depressive disorder, recurrent, moderate: Secondary | ICD-10-CM | POA: Diagnosis not present

## 2019-12-02 ENCOUNTER — Encounter: Payer: Self-pay | Admitting: Internal Medicine

## 2019-12-03 DIAGNOSIS — F331 Major depressive disorder, recurrent, moderate: Secondary | ICD-10-CM | POA: Diagnosis not present

## 2019-12-13 ENCOUNTER — Other Ambulatory Visit: Payer: Self-pay | Admitting: Internal Medicine

## 2019-12-13 DIAGNOSIS — E119 Type 2 diabetes mellitus without complications: Secondary | ICD-10-CM

## 2019-12-22 ENCOUNTER — Encounter: Payer: Self-pay | Admitting: Internal Medicine

## 2019-12-30 DIAGNOSIS — F331 Major depressive disorder, recurrent, moderate: Secondary | ICD-10-CM | POA: Diagnosis not present

## 2019-12-31 DIAGNOSIS — F331 Major depressive disorder, recurrent, moderate: Secondary | ICD-10-CM | POA: Diagnosis not present

## 2020-01-31 ENCOUNTER — Other Ambulatory Visit: Payer: Self-pay | Admitting: Internal Medicine

## 2020-01-31 ENCOUNTER — Encounter: Payer: Self-pay | Admitting: Internal Medicine

## 2020-01-31 ENCOUNTER — Ambulatory Visit (HOSPITAL_BASED_OUTPATIENT_CLINIC_OR_DEPARTMENT_OTHER): Payer: Medicaid Other | Admitting: Pharmacist

## 2020-01-31 ENCOUNTER — Ambulatory Visit: Payer: Medicaid Other | Attending: Internal Medicine | Admitting: Internal Medicine

## 2020-01-31 ENCOUNTER — Other Ambulatory Visit: Payer: Self-pay

## 2020-01-31 VITALS — BP 145/94 | HR 72 | Temp 98.4°F | Resp 16 | Wt 203.2 lb

## 2020-01-31 DIAGNOSIS — E782 Mixed hyperlipidemia: Secondary | ICD-10-CM | POA: Diagnosis not present

## 2020-01-31 DIAGNOSIS — M545 Low back pain: Secondary | ICD-10-CM | POA: Insufficient documentation

## 2020-01-31 DIAGNOSIS — I10 Essential (primary) hypertension: Secondary | ICD-10-CM | POA: Insufficient documentation

## 2020-01-31 DIAGNOSIS — Z880 Allergy status to penicillin: Secondary | ICD-10-CM | POA: Diagnosis not present

## 2020-01-31 DIAGNOSIS — Z79890 Hormone replacement therapy (postmenopausal): Secondary | ICD-10-CM | POA: Diagnosis not present

## 2020-01-31 DIAGNOSIS — Z1211 Encounter for screening for malignant neoplasm of colon: Secondary | ICD-10-CM | POA: Diagnosis not present

## 2020-01-31 DIAGNOSIS — Z23 Encounter for immunization: Secondary | ICD-10-CM

## 2020-01-31 DIAGNOSIS — F329 Major depressive disorder, single episode, unspecified: Secondary | ICD-10-CM | POA: Insufficient documentation

## 2020-01-31 DIAGNOSIS — Z888 Allergy status to other drugs, medicaments and biological substances status: Secondary | ICD-10-CM | POA: Insufficient documentation

## 2020-01-31 DIAGNOSIS — E1169 Type 2 diabetes mellitus with other specified complication: Secondary | ICD-10-CM | POA: Diagnosis not present

## 2020-01-31 DIAGNOSIS — G8929 Other chronic pain: Secondary | ICD-10-CM | POA: Insufficient documentation

## 2020-01-31 DIAGNOSIS — E669 Obesity, unspecified: Secondary | ICD-10-CM | POA: Diagnosis not present

## 2020-01-31 DIAGNOSIS — E119 Type 2 diabetes mellitus without complications: Secondary | ICD-10-CM

## 2020-01-31 DIAGNOSIS — Z6831 Body mass index (BMI) 31.0-31.9, adult: Secondary | ICD-10-CM | POA: Insufficient documentation

## 2020-01-31 DIAGNOSIS — E785 Hyperlipidemia, unspecified: Secondary | ICD-10-CM

## 2020-01-31 DIAGNOSIS — E039 Hypothyroidism, unspecified: Secondary | ICD-10-CM | POA: Diagnosis not present

## 2020-01-31 DIAGNOSIS — Z79899 Other long term (current) drug therapy: Secondary | ICD-10-CM | POA: Insufficient documentation

## 2020-01-31 LAB — POCT GLYCOSYLATED HEMOGLOBIN (HGB A1C): HbA1c, POC (controlled diabetic range): 9.8 % — AB (ref 0.0–7.0)

## 2020-01-31 LAB — GLUCOSE, POCT (MANUAL RESULT ENTRY): POC Glucose: 225 mg/dl — AB (ref 70–99)

## 2020-01-31 MED ORDER — LISINOPRIL 20 MG PO TABS: 20.0000 mg | ORAL_TABLET | Freq: Every day | ORAL | 1 refills | Status: DC

## 2020-01-31 MED ORDER — ATORVASTATIN CALCIUM 40 MG PO TABS: 40.0000 mg | ORAL_TABLET | Freq: Every day | ORAL | 3 refills | Status: DC

## 2020-01-31 MED ORDER — PROPRANOLOL HCL 80 MG PO TABS: 80.0000 mg | ORAL_TABLET | Freq: Two times a day (BID) | ORAL | 6 refills | Status: DC

## 2020-01-31 MED ORDER — METFORMIN HCL 1000 MG PO TABS: 1000.0000 mg | ORAL_TABLET | Freq: Two times a day (BID) | ORAL | 3 refills | Status: DC

## 2020-01-31 MED ORDER — ACCU-CHEK GUIDE VI STRP: ORAL_STRIP | 6 refills | Status: DC

## 2020-01-31 MED ORDER — ACCU-CHEK GUIDE ME W/DEVICE KIT: 1.0000 | PACK | Freq: Every day | 0 refills | Status: DC

## 2020-01-31 MED ORDER — AMLODIPINE BESYLATE 10 MG PO TABS: 10.0000 mg | ORAL_TABLET | Freq: Every day | ORAL | 0 refills | Status: DC

## 2020-01-31 MED ORDER — ACCU-CHEK FASTCLIX LANCETS MISC: 6 refills | Status: DC

## 2020-01-31 NOTE — Progress Notes (Signed)
Patient presents for vaccination against zoster per orders of Dr. Johnson. Consent given. Counseling provided. No contraindications exists. Vaccine administered without incident.   

## 2020-01-31 NOTE — Patient Instructions (Signed)
Increase Metformin to 1000 mg twice a day. Increase lisinopril to 20 mg daily.   Diabetes Mellitus and Nutrition, Adult When you have diabetes (diabetes mellitus), it is very important to have healthy eating habits because your blood sugar (glucose) levels are greatly affected by what you eat and drink. Eating healthy foods in the appropriate amounts, at about the same times every day, can help you:  Control your blood glucose.  Lower your risk of heart disease.  Improve your blood pressure.  Reach or maintain a healthy weight. Every person with diabetes is different, and each person has different needs for a meal plan. Your health care provider may recommend that you work with a diet and nutrition specialist (dietitian) to make a meal plan that is best for you. Your meal plan may vary depending on factors such as:  The calories you need.  The medicines you take.  Your weight.  Your blood glucose, blood pressure, and cholesterol levels.  Your activity level.  Other health conditions you have, such as heart or kidney disease. How do carbohydrates affect me? Carbohydrates, also called carbs, affect your blood glucose level more than any other type of food. Eating carbs naturally raises the amount of glucose in your blood. Carb counting is a method for keeping track of how many carbs you eat. Counting carbs is important to keep your blood glucose at a healthy level, especially if you use insulin or take certain oral diabetes medicines. It is important to know how many carbs you can safely have in each meal. This is different for every person. Your dietitian can help you calculate how many carbs you should have at each meal and for each snack. Foods that contain carbs include:  Bread, cereal, rice, pasta, and crackers.  Potatoes and corn.  Peas, beans, and lentils.  Milk and yogurt.  Fruit and juice.  Desserts, such as cakes, cookies, ice cream, and candy. How does alcohol  affect me? Alcohol can cause a sudden decrease in blood glucose (hypoglycemia), especially if you use insulin or take certain oral diabetes medicines. Hypoglycemia can be a life-threatening condition. Symptoms of hypoglycemia (sleepiness, dizziness, and confusion) are similar to symptoms of having too much alcohol. If your health care provider says that alcohol is safe for you, follow these guidelines:  Limit alcohol intake to no more than 1 drink per day for nonpregnant women and 2 drinks per day for men. One drink equals 12 oz of beer, 5 oz of wine, or 1 oz of hard liquor.  Do not drink on an empty stomach.  Keep yourself hydrated with water, diet soda, or unsweetened iced tea.  Keep in mind that regular soda, juice, and other mixers may contain a lot of sugar and must be counted as carbs. What are tips for following this plan?  Reading food labels  Start by checking the serving size on the "Nutrition Facts" label of packaged foods and drinks. The amount of calories, carbs, fats, and other nutrients listed on the label is based on one serving of the item. Many items contain more than one serving per package.  Check the total grams (g) of carbs in one serving. You can calculate the number of servings of carbs in one serving by dividing the total carbs by 15. For example, if a food has 30 g of total carbs, it would be equal to 2 servings of carbs.  Check the number of grams (g) of saturated and trans fats in one serving.  Choose foods that have low or no amount of these fats.  Check the number of milligrams (mg) of salt (sodium) in one serving. Most people should limit total sodium intake to less than 2,300 mg per day.  Always check the nutrition information of foods labeled as "low-fat" or "nonfat". These foods may be higher in added sugar or refined carbs and should be avoided.  Talk to your dietitian to identify your daily goals for nutrients listed on the label. Shopping  Avoid buying  canned, premade, or processed foods. These foods tend to be high in fat, sodium, and added sugar.  Shop around the outside edge of the grocery store. This includes fresh fruits and vegetables, bulk grains, fresh meats, and fresh dairy. Cooking  Use low-heat cooking methods, such as baking, instead of high-heat cooking methods like deep frying.  Cook using healthy oils, such as olive, canola, or sunflower oil.  Avoid cooking with butter, cream, or high-fat meats. Meal planning  Eat meals and snacks regularly, preferably at the same times every day. Avoid going long periods of time without eating.  Eat foods high in fiber, such as fresh fruits, vegetables, beans, and whole grains. Talk to your dietitian about how many servings of carbs you can eat at each meal.  Eat 4-6 ounces (oz) of lean protein each day, such as lean meat, chicken, fish, eggs, or tofu. One oz of lean protein is equal to: ? 1 oz of meat, chicken, or fish. ? 1 egg. ?  cup of tofu.  Eat some foods each day that contain healthy fats, such as avocado, nuts, seeds, and fish. Lifestyle  Check your blood glucose regularly.  Exercise regularly as told by your health care provider. This may include: ? 150 minutes of moderate-intensity or vigorous-intensity exercise each week. This could be brisk walking, biking, or water aerobics. ? Stretching and doing strength exercises, such as yoga or weightlifting, at least 2 times a week.  Take medicines as told by your health care provider.  Do not use any products that contain nicotine or tobacco, such as cigarettes and e-cigarettes. If you need help quitting, ask your health care provider.  Work with a Social worker or diabetes educator to identify strategies to manage stress and any emotional and social challenges. Questions to ask a health care provider  Do I need to meet with a diabetes educator?  Do I need to meet with a dietitian?  What number can I call if I have  questions?  When are the best times to check my blood glucose? Where to find more information:  American Diabetes Association: diabetes.org  Academy of Nutrition and Dietetics: www.eatright.[employer] of Diabetes and Digestive and Kidney Diseases (NIH): [employer] Summary  A healthy meal plan will help you control your blood glucose and maintain a healthy lifestyle.  Working with a diet and nutrition specialist (dietitian) can help you make a meal plan that is best for you.  Keep in mind that carbohydrates (carbs) and alcohol have immediate effects on your blood glucose levels. It is important to count carbs and to use alcohol carefully. This information is not intended to replace advice given to you by your health care provider. Make sure you discuss any questions you have with your health care provider. Document Revised: 09/19/2017 Document Reviewed: 11/11/2016 Elsevier Patient Education  2020 [employer].

## 2020-01-31 NOTE — Progress Notes (Signed)
Patient ID: [name], female    DOB: 1961-10-07  MRN: [id number]  CC: Diabetes and Hypertension   Subjective: [name] is a 59 y.o. female who presents for chronic ds management Her concerns today include:  HTN, HL, depression, hypothyroid, DM, tension HA, chronic LBP (minimal degen disc ds on MRI).   HM:  We never received the FIT that she mailed  DIABETES TYPE 2 Last A1C:   Results for orders placed or performed in visit on 01/31/20  POCT glucose (manual entry)  Result Value Ref Range   POC Glucose 225 (A) 70 - 99 mg/dl  POCT glycosylated hemoglobin (Hb A1C)  Result Value Ref Range   Hemoglobin A1C     HbA1c POC (<> result, manual entry)     HbA1c, POC (prediabetic range)     HbA1c, POC (controlled diabetic range) 9.8 (A) 0.0 - 7.0 %    Med Adherence:  [x]  Yes - Metformin 500 mg BID   []  No Medication side effects:  []  Yes    []  No Home Monitoring?  []  Yes    [x]  No - would like new device.  She tells me that her insurance would not pay for a new device to her last year because they said it was too early Home glucose results range: Diet Adherence: []  Yes    [x]  No - "I can do better."   Drinks regular soda, milk. Snack on puddings and other sweet things Exercise: []  Yes    [x]  No - not doing much due to chronic back pain.  Using Voltaren gel which helps.  Hypoglycemic episodes?: []  Yes    []  No Numbness of the feet? []  Yes    [x]  No Retinopathy hx? []  Yes    []  No Last eye exam: over due for eye exam.  Has transportation issues.  Comments:   HYPERTENSION Currently taking: see medication list.  She reports compliance with amlodipine, lisinopril and propranolol.  Looks like she should have been out of the lisinopril based on the last time the prescription was written but she tells me that she has it and took her last one today. Med Adherence: [x]  Yes    []  No Medication side effects: []  Yes    [x]  No Adherence with salt restriction: [x]  Yes    []  No Home  Monitoring?: [x]  Yes    []  No Monitoring Frequency: daily.  Does not have log Home BP results range: 170s/90 SOB? []  Yes    [x]  No Chest Pain?: []  Yes    [x]  No Leg swelling?: []  Yes    [x]  No Headaches?: []  Yes    [x]  No Dizziness? []  Yes    [x]  No Comments:   HL: reports compliance with Lipitor but last rxn in system is for 90 pills written 06/2020  Hypothyroid:  Reports compliance with Levothyroxine. Feels cold a lot.  No major wgh changes Patient Active Problem List   Diagnosis Date Noted  . Spondylosis without myelopathy or radiculopathy, lumbar region 09/17/2017  . Essential hypertension 09/05/2017  . Chronic bilateral low back pain with left-sided sciatica 09/05/2017  . HTN (hypertension) 05/05/2017  . Hypothyroidism 02/24/2017  . Mixed hyperlipidemia 02/24/2017  . 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 Sleep related headaches 01/23/2017  . Snoring 01/23/2017  . 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 Chronic headaches 10/30/2016  . Obesity 05/24/2016  . PMB (postmenopausal bleeding) 05/24/2016  . Back pain of lumbar region with sciatica 09/28/2015  Current Outpatient Medications on File Prior to Visit  Medication Sig Dispense Refill  . acetaminophen (TYLENOL) 325 MG tablet Take 2 tablets (650 mg total) by mouth every 6 (six) hours as needed for moderate pain. 30 tablet   . amitriptyline (ELAVIL) 25 MG tablet Take 1 tablet (25 mg total) by mouth at bedtime. 30 tablet 0  . Blood Pressure Monitor DEVI Use as directed to check home blood pressure 2-3 times a week 1 Device 0  . Cholecalciferol (VITAMIN D) 2000 units tablet Take 2,000 Units by mouth daily.    . divalproex (DEPAKOTE) 500 MG DR tablet Take one tablet in the morning and 2 at night (Patient taking differently: Take 500-1,000 mg by mouth See admin instructions. Take 527m in the morning and 1003mat night) 90 tablet 6  . escitalopram (LEXAPRO) 10 MG tablet Take 10 mg by mouth at bedtime.  2  .  levothyroxine (SYNTHROID) 50 MCG tablet Take 1 tablet (50 mcg total) by mouth daily. 30 tablet 2  . meloxicam (MOBIC) 15 MG tablet Take 1 tablet (15 mg total) by mouth daily. Take with food 30 tablet 2  . methocarbamol (ROBAXIN) 500 MG tablet Take 1 tablet (500 mg total) by mouth every 8 (eight) hours as needed for muscle spasms. 60 tablet 0  . Omega-3 Fatty Acids (FISH OIL) 1000 MG CAPS Take 1,000 mg by mouth 2 (two) times daily.     No current facility-administered medications on file prior to visit.    Allergies  Allergen Reactions  . Iron Diarrhea  . Penicillins Nausea And Vomiting    Has patient had a PCN reaction causing immediate rash, facial/tongue/throat swelling, SOB or lightheadedness with hypotension: No Has patient had a PCN reaction causing severe rash involving mucus membranes or skin necrosis: No Has patient had a PCN reaction that required hospitalization No Has patient had a PCN reaction occurring within the last 10 years: No If all of the above answers are "NO", then may proceed with Cephalosporin use.    Social History   Socioeconomic History  . Marital status: Single    Spouse name: Not on file  . Number of children: 1  . Years of education: 1285. Highest education level: Not on file  Occupational History  . Occupation: Unemployed  Tobacco Use  . Smoking status: Never Smoker  . Smokeless tobacco: Never Used  Substance and Sexual Activity  . Alcohol use: No    Alcohol/week: 0.0 standard drinks  . Drug use: No  . Sexual activity: Not Currently    Partners: Male  Other Topics Concern  . Not on file  Social History Narrative   Lives with son, [name]  Caffeine use: Daily       Left handed   Social Determinants of Health   Financial Resource Strain:   . Difficulty of Paying Living Expenses:   Food Insecurity:   . Worried About [name] the Last Year:   . [name] the Last Year:   Transportation Needs:   . LaLexicographerMedical):   . [name] Kitchenack of Transportation (Non-Medical):   Physical Activity:   . Days of Exercise per Week:   . Minutes of Exercise per Session:   Stress:   . Feeling of Stress :   Social Connections:   . Frequency of Communication with Friends and Family:   . Frequency of Social Gatherings with Friends and Family:   . Attends Religious  Services:   . Active Member of Clubs or Organizations:   . Attends Archivist Meetings:   [name] Kitchen Marital Status:   Intimate Partner Violence:   . Fear of Current or Ex-Partner:   . Emotionally Abused:   [name] Kitchen Physically Abused:   . Sexually Abused:     Family History  Problem Relation Age of Onset  . Headache Neg Hx   . Colon cancer Neg Hx   . Esophageal cancer Neg Hx   . Rectal cancer Neg Hx   . Stomach cancer Neg Hx   . Breast cancer Neg Hx     Past Surgical History:  Procedure Laterality Date  . BREAST CYST EXCISION Right 1980  . BREAST CYST EXCISION Left 1985  . NO PAST SURGERIES      ROS: Review of Systems Negative except as stated above  PHYSICAL EXAM: BP (!) 145/94   Pulse 72   Temp 98.4 F (36.9 C)   Resp 16   Wt 203 lb 3.2 oz (92.2 kg)   LMP 06/06/2015   SpO2 97%   BMI 31.36 kg/m   Wt Readings from Last 3 Encounters:  01/31/20 203 lb 3.2 oz (92.2 kg)  08/24/18 208 lb 12.8 oz (94.7 kg)  05/26/18 201 lb 3.2 oz (91.3 kg)    Physical Exam  General appearance - alert, well appearing, obese middle-age Caucasian female and in no distress Mental status - normal mood, behavior, speech, dress, motor activity, and thought processes Eyes - pupils equal and reactive, extraocular eye movements intact Nose - normal and patent, no erythema, discharge or polyps Mouth - mucous membranes moist, pharynx normal without lesions Neck - supple, no significant adenopathy Chest - clear to auscultation, no wheezes, rales or rhonchi, symmetric air entry Heart - RRR, soft SEM LT sternal border Extremities - peripheral  pulses normal, no pedal edema, no clubbing or cyanosis Diabetic Foot Exam - Simple   Simple Foot Form Visual Inspection See comments: Yes Sensation Testing Intact to touch and monofilament testing bilaterally: Yes Pulse Check Posterior Tibialis and Dorsalis pulse intact bilaterally: Yes Comments Skin over the toes and on some parts of the sole are thick and overgrown.      CMP Latest Ref Rng & Units 02/12/2018 05/06/2017 05/05/2017  Glucose 65 - 99 mg/dL 211(H) 147(H) 126(H)  BUN 6 - 24 mg/dL 14 9 11   Creatinine 0.57 - 1.00 mg/dL 0.94 0.79 0.94  Sodium 134 - 144 mmol/L 139 135 137  Potassium 3.5 - 5.2 mmol/L 4.1 4.1 3.8  Chloride 96 - 106 mmol/L 101 104 106  CO2 20 - 29 mmol/L 23 24 23   Calcium 8.7 - 10.2 mg/dL 9.1 8.9 9.4  Total Protein 6.0 - 8.5 g/dL 8.6(H) - -  Total Bilirubin 0.0 - 1.2 mg/dL <0.2 - -  Alkaline Phos 39 - 117 IU/L 101 - -  AST 0 - 40 IU/L 22 - -  ALT 0 - 32 IU/L 29 - -   Lipid Panel     Component Value Date/Time   CHOL 303 (H) 02/12/2018 0854   TRIG 340 (H) 02/12/2018 0854   HDL 33 (L) 02/12/2018 0854   CHOLHDL 9.2 (H) 02/12/2018 0854   CHOLHDL 9.2 (H) 09/28/2015 1420   VLDL 61 (H) 09/28/2015 1420   LDLCALC 202 (H) 02/12/2018 0854    CBC    Component Value Date/Time   WBC 7.4 05/22/2018 1541   WBC 5.7 05/06/2017 0144   RBC 4.97 05/22/2018 1541   RBC  4.37 05/06/2017 0144   HGB 15.2 05/22/2018 1541   HCT 45.2 05/22/2018 1541   PLT 316 05/22/2018 1541   MCV 91 05/22/2018 1541   MCH 30.6 05/22/2018 1541   MCH 30.7 05/06/2017 0144   MCHC 33.6 05/22/2018 1541   MCHC 33.5 05/06/2017 0144   RDW 14.6 05/22/2018 1541   LYMPHSABS 1.8 05/05/2017 1108   LYMPHSABS 2.4 02/17/2017 1141   MONOABS 0.7 05/05/2017 1108   EOSABS 0.1 05/05/2017 1108   EOSABS 0.1 02/17/2017 1141   BASOSABS 0.0 05/05/2017 1108   BASOSABS 0.1 02/17/2017 1141    ASSESSMENT AND PLAN: 1. Type 2 diabetes mellitus without complication, without long-term current use of insulin  (HCC) Not at goal. Dietary counseling given.  Advised patient to eliminate sugary drinks from the diet and to drink more water instead.  Discussed eating healthier snacks like fruits or nuts.  Advised to cut back on white carbohydrates. Encouraged her to get out and walk if only for 10 minutes a day.  We discussed the importance of exercise and helping to control blood sugars. Increase Metformin to 1 g twice a day. - POCT glucose (manual entry) - POCT glycosylated hemoglobin (Hb A1C) - CBC - Comprehensive metabolic panel - Lipid panel - Microalbumin / creatinine urine ratio - Ambulatory referral to Ophthalmology - metFORMIN (GLUCOPHAGE) 1000 MG tablet; Take 1 tablet (1,000 mg total) by mouth 2 (two) times daily with a meal.  Dispense: 180 tablet; Refill: 3 - glucose blood (ACCU-CHEK GUIDE) test strip; USE AS DIRECTED TO check blood sugar daily  Dispense: 100 strip; Refill: 6 - Blood Glucose Monitoring Suppl (ACCU-CHEK GUIDE ME) w/Device KIT; 1 kit by Does not apply route daily. Use as instructed to check blood sugar daily. E11.9  Dispense: 1 kit; Refill: 0 - Accu-Chek FastClix Lancets MISC; Use as instructed to check blood sugar daily. E11.9  Dispense: 102 each; Refill: 6  2. Essential hypertension Not at goal.  Continue amlodipine, propranolol.  Increase lisinopril to 20 mg daily.  DASH diet discussed and encouraged - amLODipine (NORVASC) 10 MG tablet; Take 1 tablet (10 mg total) by mouth daily.  Dispense: 90 tablet; Refill: 0 - lisinopril (ZESTRIL) 20 MG tablet; Take 1 tablet (20 mg total) by mouth daily.  Dispense: 90 tablet; Refill: 1 - propranolol (INDERAL) 80 MG tablet; Take 1 tablet (80 mg total) by mouth 2 (two) times daily.  Dispense: 60 tablet; Refill: 6  3. Hypothyroidism (acquired) Continue levothyroxine - TSH  4. Need for shingles vaccine She is overdue for the second shingles vaccine.  This will be given today  5. Hyperlipidemia associated with type 2 diabetes mellitus  (HCC) - atorvastatin (LIPITOR) 40 MG tablet; Take 1 tablet (40 mg total) by mouth daily.  Dispense: 90 tablet; Refill: 3  6. Obesity (BMI 30.0-34.9) See #1 above  7. Colon cancer screening She is agreeable to trying to do the fit test again. - Fecal occult blood, imunochemical([facility])    Patient was given the opportunity to ask questions.  Patient verbalized understanding of the plan and was able to repeat key elements of the plan.   Orders Placed This Encounter  Procedures  . Fecal occult blood, imunochemical([facility])  . CBC  . Comprehensive metabolic panel  . Lipid panel  . Microalbumin / creatinine urine ratio  . TSH  . Ambulatory referral to Ophthalmology  . POCT glucose (manual entry)  . POCT glycosylated hemoglobin (Hb A1C)     Requested Prescriptions   Signed Prescriptions Disp Refills  .  amLODipine (NORVASC) 10 MG tablet 90 tablet 0    Sig: Take 1 tablet (10 mg total) by mouth daily.  [name] Kitchen lisinopril (ZESTRIL) 20 MG tablet 90 tablet 1    Sig: Take 1 tablet (20 mg total) by mouth daily.  . propranolol (INDERAL) 80 MG tablet 60 tablet 6    Sig: Take 1 tablet (80 mg total) by mouth 2 (two) times daily.  [name] Kitchen atorvastatin (LIPITOR) 40 MG tablet 90 tablet 3    Sig: Take 1 tablet (40 mg total) by mouth daily.  . metFORMIN (GLUCOPHAGE) 1000 MG tablet 180 tablet 3    Sig: Take 1 tablet (1,000 mg total) by mouth 2 (two) times daily with a meal.  . glucose blood (ACCU-CHEK GUIDE) test strip 100 strip 6    Sig: USE AS DIRECTED TO check blood sugar daily  . Blood Glucose Monitoring Suppl (ACCU-CHEK GUIDE ME) w/Device KIT 1 kit 0    Sig: 1 kit by Does not apply route daily. Use as instructed to check blood sugar daily. E11.9  . Accu-Chek FastClix Lancets MISC 102 each 6    Sig: Use as instructed to check blood sugar daily. E11.9    Return in about 4 months (around 06/01/2020).  Karle Plumber, MD, FACP

## 2020-02-01 ENCOUNTER — Encounter: Payer: Self-pay | Admitting: Internal Medicine

## 2020-02-01 LAB — COMPREHENSIVE METABOLIC PANEL
ALT: 53 IU/L — ABNORMAL HIGH (ref 0–32)
AST: 44 IU/L — ABNORMAL HIGH (ref 0–40)
Albumin/Globulin Ratio: 1.1 — ABNORMAL LOW (ref 1.2–2.2)
Albumin: 4.9 g/dL (ref 3.8–4.9)
Alkaline Phosphatase: 133 IU/L — ABNORMAL HIGH (ref 39–117)
BUN/Creatinine Ratio: 14 (ref 9–23)
BUN: 12 mg/dL (ref 6–24)
Bilirubin Total: 0.5 mg/dL (ref 0.0–1.2)
CO2: 24 mmol/L (ref 20–29)
Calcium: 10.4 mg/dL — ABNORMAL HIGH (ref 8.7–10.2)
Chloride: 97 mmol/L (ref 96–106)
Creatinine, Ser: 0.86 mg/dL (ref 0.57–1.00)
GFR calc Af Amer: 86 mL/min/{1.73_m2} (ref >59)
GFR calc non Af Amer: 75 mL/min/{1.73_m2} (ref >59)
Globulin, Total: 4.5 g/dL (ref 1.5–4.5)
Glucose: 186 mg/dL — ABNORMAL HIGH (ref 65–99)
Potassium: 4.7 mmol/L (ref 3.5–5.2)
Sodium: 139 mmol/L (ref 134–144)
Total Protein: 9.4 g/dL — ABNORMAL HIGH (ref 6.0–8.5)

## 2020-02-01 LAB — CBC
Hematocrit: 48.2 % — ABNORMAL HIGH (ref 34.0–46.6)
Hemoglobin: 16.4 g/dL — ABNORMAL HIGH (ref 11.1–15.9)
MCH: 31.7 pg (ref 26.6–33.0)
MCHC: 34 g/dL (ref 31.5–35.7)
MCV: 93 fL (ref 79–97)
Platelets: 337 10*3/uL (ref 150–450)
RBC: 5.18 x10E6/uL (ref 3.77–5.28)
RDW: 12.9 % (ref 11.7–15.4)
WBC: 9.2 10*3/uL (ref 3.4–10.8)

## 2020-02-01 LAB — LIPID PANEL
Chol/HDL Ratio: 6.3 ratio — ABNORMAL HIGH (ref 0.0–4.4)
Cholesterol, Total: 234 mg/dL — ABNORMAL HIGH (ref 100–199)
HDL: 37 mg/dL — ABNORMAL LOW (ref >39)
LDL Chol Calc (NIH): 160 mg/dL — ABNORMAL HIGH (ref 0–99)
Triglycerides: 198 mg/dL — ABNORMAL HIGH (ref 0–149)
VLDL Cholesterol Cal: 37 mg/dL (ref 5–40)

## 2020-02-01 LAB — MICROALBUMIN / CREATININE URINE RATIO
Creatinine, Urine: 281.1 mg/dL
Microalb/Creat Ratio: 41 mg/g creat — ABNORMAL HIGH (ref 0–29)
Microalbumin, Urine: 114.1 ug/mL

## 2020-02-01 LAB — TSH: TSH: 2.68 u[IU]/mL (ref 0.450–4.500)

## 2020-02-02 ENCOUNTER — Encounter: Payer: Self-pay | Admitting: Internal Medicine

## 2020-02-02 ENCOUNTER — Other Ambulatory Visit: Payer: Self-pay | Admitting: Internal Medicine

## 2020-02-02 DIAGNOSIS — D751 Secondary polycythemia: Secondary | ICD-10-CM

## 2020-02-02 DIAGNOSIS — R7989 Other specified abnormal findings of blood chemistry: Secondary | ICD-10-CM

## 2020-02-02 DIAGNOSIS — Z114 Encounter for screening for human immunodeficiency virus [HIV]: Secondary | ICD-10-CM

## 2020-02-02 DIAGNOSIS — R945 Abnormal results of liver function studies: Secondary | ICD-10-CM

## 2020-02-02 DIAGNOSIS — G4733 Obstructive sleep apnea (adult) (pediatric): Secondary | ICD-10-CM | POA: Insufficient documentation

## 2020-02-02 DIAGNOSIS — E8809 Other disorders of plasma-protein metabolism, not elsewhere classified: Secondary | ICD-10-CM

## 2020-02-03 ENCOUNTER — Encounter: Payer: Self-pay | Admitting: Internal Medicine

## 2020-02-18 DIAGNOSIS — F331 Major depressive disorder, recurrent, moderate: Secondary | ICD-10-CM | POA: Diagnosis not present

## 2020-03-02 DIAGNOSIS — F331 Major depressive disorder, recurrent, moderate: Secondary | ICD-10-CM | POA: Diagnosis not present

## 2020-04-20 DIAGNOSIS — F331 Major depressive disorder, recurrent, moderate: Secondary | ICD-10-CM | POA: Diagnosis not present

## 2020-04-27 ENCOUNTER — Other Ambulatory Visit: Payer: Self-pay | Admitting: Internal Medicine

## 2020-04-27 DIAGNOSIS — I10 Essential (primary) hypertension: Secondary | ICD-10-CM

## 2020-05-11 DIAGNOSIS — F331 Major depressive disorder, recurrent, moderate: Secondary | ICD-10-CM | POA: Diagnosis not present

## 2020-05-23 DIAGNOSIS — F331 Major depressive disorder, recurrent, moderate: Secondary | ICD-10-CM | POA: Diagnosis not present

## 2020-05-27 ENCOUNTER — Encounter: Payer: Self-pay | Admitting: Internal Medicine

## 2020-06-06 ENCOUNTER — Telehealth: Payer: Medicaid Other | Admitting: Internal Medicine

## 2020-06-08 ENCOUNTER — Encounter: Payer: Self-pay | Admitting: Internal Medicine

## 2020-06-08 DIAGNOSIS — E119 Type 2 diabetes mellitus without complications: Secondary | ICD-10-CM | POA: Diagnosis not present

## 2020-06-08 DIAGNOSIS — H5213 Myopia, bilateral: Secondary | ICD-10-CM | POA: Diagnosis not present

## 2020-06-08 LAB — HM DIABETES EYE EXAM

## 2020-06-21 ENCOUNTER — Encounter: Payer: Self-pay | Admitting: Internal Medicine

## 2020-06-21 NOTE — Progress Notes (Signed)
Received note from [facility].  Seen 06/08/20.  No DM retinopathy.

## 2020-06-23 DIAGNOSIS — F331 Major depressive disorder, recurrent, moderate: Secondary | ICD-10-CM | POA: Diagnosis not present

## 2020-06-30 DIAGNOSIS — H5213 Myopia, bilateral: Secondary | ICD-10-CM | POA: Diagnosis not present

## 2020-07-05 ENCOUNTER — Other Ambulatory Visit: Payer: Self-pay | Admitting: Internal Medicine

## 2020-07-05 DIAGNOSIS — I10 Essential (primary) hypertension: Secondary | ICD-10-CM

## 2020-07-05 NOTE — Telephone Encounter (Signed)
Requested Prescriptions  Pending Prescriptions Disp Refills   lisinopril (ZESTRIL) 20 MG tablet [Pharmacy Med Name: lisinopril 20 mg tablet] 90 tablet 0    Sig: Take 1 tablet (20 mg total) by mouth daily.     Cardiovascular:  ACE Inhibitors Failed - 07/05/2020  3:12 PM      Failed - Last BP in normal range    BP Readings from Last 1 Encounters:  01/31/20 (!) 145/94         Passed - Cr in normal range and within 180 days    Creat  Date Value Ref Range Status  09/28/2015 0.77 0.50 - 1.05 mg/dL Final   Creatinine, Ser  Date Value Ref Range Status  01/31/2020 0.86 0.57 - 1.00 mg/dL Final         Passed - K in normal range and within 180 days    Potassium  Date Value Ref Range Status  01/31/2020 4.7 3.5 - 5.2 mmol/L Final         Passed - Patient is not pregnant      Passed - Valid encounter within last 6 months    Recent Outpatient Visits          5 months ago Need for shingles vaccine   [place], Annie Main L, RPH-CPP   5 months ago Type 2 diabetes mellitus without complication, without long-term current use of insulin ([place])   [place], Neoma Laming B, MD   1 year ago Controlled type 2 diabetes mellitus without complication, without long-term current use of insulin ([place])   [place], Deborah B, MD   1 year ago Controlled type 2 diabetes mellitus without complication, without long-term current use of insulin [facility])   [place], Deborah B, MD   1 year ago Controlled type 2 diabetes mellitus without complication, without long-term current use of insulin [facility])   [place], MD      Future Appointments            In 3 weeks Wynetta Emery Dalbert Batman, MD [place]

## 2020-07-20 ENCOUNTER — Other Ambulatory Visit: Payer: Self-pay | Admitting: Internal Medicine

## 2020-07-20 DIAGNOSIS — I10 Essential (primary) hypertension: Secondary | ICD-10-CM

## 2020-07-21 DIAGNOSIS — F331 Major depressive disorder, recurrent, moderate: Secondary | ICD-10-CM | POA: Diagnosis not present

## 2020-07-27 DIAGNOSIS — H524 Presbyopia: Secondary | ICD-10-CM | POA: Diagnosis not present

## 2020-07-31 ENCOUNTER — Encounter: Payer: Self-pay | Admitting: Internal Medicine

## 2020-07-31 ENCOUNTER — Ambulatory Visit: Payer: Medicaid Other | Attending: Internal Medicine | Admitting: Internal Medicine

## 2020-07-31 ENCOUNTER — Other Ambulatory Visit: Payer: Self-pay

## 2020-07-31 VITALS — Ht 67.0 in

## 2020-07-31 DIAGNOSIS — E119 Type 2 diabetes mellitus without complications: Secondary | ICD-10-CM | POA: Diagnosis not present

## 2020-07-31 DIAGNOSIS — E785 Hyperlipidemia, unspecified: Secondary | ICD-10-CM

## 2020-07-31 DIAGNOSIS — Z23 Encounter for immunization: Secondary | ICD-10-CM | POA: Diagnosis not present

## 2020-07-31 DIAGNOSIS — E1169 Type 2 diabetes mellitus with other specified complication: Secondary | ICD-10-CM

## 2020-07-31 DIAGNOSIS — E039 Hypothyroidism, unspecified: Secondary | ICD-10-CM | POA: Diagnosis not present

## 2020-07-31 DIAGNOSIS — I10 Essential (primary) hypertension: Secondary | ICD-10-CM

## 2020-07-31 MED ORDER — LEVOTHYROXINE SODIUM 50 MCG PO TABS: 50.0000 ug | ORAL_TABLET | Freq: Every day | ORAL | 5 refills | Status: DC

## 2020-07-31 MED ORDER — AMLODIPINE BESYLATE 10 MG PO TABS: 10.0000 mg | ORAL_TABLET | Freq: Every day | ORAL | 1 refills | Status: DC

## 2020-07-31 NOTE — Progress Notes (Signed)
Pt states her blood sugar this morning was 170

## 2020-07-31 NOTE — Progress Notes (Signed)
Virtual Visit via Telephone Note Due to current restrictions/limitations of in-office visits due to the COVID-19 pandemic, this scheduled clinical appointment was converted to a telehealth visit  I connected with [name] on 07/31/20 at 2:57 p.m by telephone and verified that I am speaking with the correct person using two identifiers. I am in my office.  The patient is at home.  Only the patient and myself participated in this encounter.  I discussed the limitations, risks, security and privacy concerns of performing an evaluation and management service by telephone and the availability of in person appointments. I also discussed with the patient that there may be a patient responsible charge related to this service. The patient expressed understanding and agreed to proceed.   History of Present Illness: HTN, HL, depression, hypothyroid, DM, tension HA, chronic LBP (minimal degen disc ds on MRI).   Last seen 01/2020.  Purpose of today's visit is chronic disease management.   DIABETES TYPE 2 Last A1C:   Lab Results  Component Value Date   HGBA1C 9.8 (A) 01/31/2020   Med Adherence:  [x] Yes on Metformin   Medication side effects:  [] Yes    [x] No Home Monitoring?  [x] Yes  QOD before BF and before dinner Home glucose results range: before meals BS can be in 70s, and 180s "during lunch" Diet Adherence: [x] Yes - drinks mainly water Exercise: [x] Yes when it is not hot outside.  She gets out and walk Hypoglycemic episodes?: [] Yes    [] No Numbness of the feet? [] Yes    [x] No Retinopathy hx? [] Yes    [x] No Last eye exam:  06/08/20 Comments:  HTN:  Checks BP daily.  Last reading was 130/80 No CP/SOB/LE edema Out of Amlodipine x2 days  Hypothyroid: compliant with med.  No palpitations.  No major weight changes.  HL: last lipid profile was not at goal.  She reports taking the Lipitor 40 mg daily  HM: due for flu shot, Tdap and mammogram.  Fit test kit mailed to her in April  of this year but we have not received results. Outpatient Encounter Medications as of 07/31/2020  Medication Sig  . Accu-Chek FastClix Lancets MISC Use as instructed to check blood sugar daily. E11.9  . acetaminophen (TYLENOL) 325 MG tablet Take 2 tablets (650 mg total) by mouth every 6 (six) hours as needed for moderate pain.  Marland Kitchen amitriptyline (ELAVIL) 25 MG tablet Take 1 tablet (25 mg total) by mouth at bedtime.  Marland Kitchen amLODipine (NORVASC) 10 MG tablet Take 1 tablet (10 mg total) by mouth daily.  Marland Kitchen atorvastatin (LIPITOR) 40 MG tablet Take 1 tablet (40 mg total) by mouth daily.  . Blood Glucose Monitoring Suppl (ACCU-CHEK GUIDE ME) w/Device KIT 1 kit by Does not apply route daily. Use as instructed to check blood sugar daily. E11.9  . Blood Pressure Monitor DEVI Use as directed to check home blood pressure 2-3 times a week  . Cholecalciferol (VITAMIN D) 2000 units tablet Take 2,000 Units by mouth daily.  . divalproex (DEPAKOTE) 500 MG DR tablet Take one tablet in the morning and 2 at night (Patient taking differently: Take 500-1,000 mg by mouth See admin instructions. Take 529m in the morning and 10082mat night)  . escitalopram (LEXAPRO) 10 MG tablet Take 10 mg by mouth at bedtime.  . Marland Kitchenlucose blood (ACCU-CHEK GUIDE) test strip USE AS DIRECTED TO check blood sugar daily  . levothyroxine (SYNTHROID) 50 MCG tablet  Take 1 tablet (50 mcg total) by mouth daily.  Marland Kitchen lisinopril (ZESTRIL) 20 MG tablet Take 1 tablet (20 mg total) by mouth daily.  . meloxicam (MOBIC) 15 MG tablet Take 1 tablet (15 mg total) by mouth daily. Take with food  . metFORMIN (GLUCOPHAGE) 1000 MG tablet Take 1 tablet (1,000 mg total) by mouth 2 (two) times daily with a meal.  . methocarbamol (ROBAXIN) 500 MG tablet Take 1 tablet (500 mg total) by mouth every 8 (eight) hours as needed for muscle spasms.  . Omega-3 Fatty Acids (FISH OIL) 1000 MG CAPS Take 1,000 mg by mouth 2 (two) times daily.  . propranolol (INDERAL) 80 MG tablet Take 1  tablet (80 mg total) by mouth 2 (two) times daily.   No facility-administered encounter medications on file as of 07/31/2020.    Observations/Objective: Results for orders placed or performed in visit on 01/31/20  CBC  Result Value Ref Range   WBC 9.2 3.4 - 10.8 x10E3/uL   RBC 5.18 3.77 - 5.28 x10E6/uL   Hemoglobin 16.4 (H) 11.1 - 15.9 g/dL   Hematocrit 48.2 (H) 34.0 - 46.6 %   MCV 93 79 - 97 fL   MCH 31.7 26.6 - 33.0 pg   MCHC 34.0 31 - 35 g/dL   RDW 12.9 11.7 - 15.4 %   Platelets 337 150 - 450 x10E3/uL  Comprehensive metabolic panel  Result Value Ref Range   Glucose 186 (H) 65 - 99 mg/dL   BUN 12 6 - 24 mg/dL   Creatinine, Ser 0.86 0.57 - 1.00 mg/dL   GFR calc non Af Amer 75 >59 mL/min/1.73   GFR calc Af Amer 86 >59 mL/min/1.73   BUN/Creatinine Ratio 14 9 - 23   Sodium 139 134 - 144 mmol/L   Potassium 4.7 3.5 - 5.2 mmol/L   Chloride 97 96 - 106 mmol/L   CO2 24 20 - 29 mmol/L   Calcium 10.4 (H) 8.7 - 10.2 mg/dL   Total Protein 9.4 (H) 6.0 - 8.5 g/dL   Albumin 4.9 3.8 - 4.9 g/dL   Globulin, Total 4.5 1.5 - 4.5 g/dL   Albumin/Globulin Ratio 1.1 (L) 1.2 - 2.2   Bilirubin Total 0.5 0.0 - 1.2 mg/dL   Alkaline Phosphatase 133 (H) 39 - 117 IU/L   AST 44 (H) 0 - 40 IU/L   ALT 53 (H) 0 - 32 IU/L  Lipid panel  Result Value Ref Range   Cholesterol, Total 234 (H) 100 - 199 mg/dL   Triglycerides 198 (H) 0 - 149 mg/dL   HDL 37 (L) >39 mg/dL   VLDL Cholesterol Cal 37 5 - 40 mg/dL   LDL Chol Calc (NIH) 160 (H) 0 - 99 mg/dL   Chol/HDL Ratio 6.3 (H) 0.0 - 4.4 ratio  Microalbumin / creatinine urine ratio  Result Value Ref Range   Creatinine, Urine 281.1 Not Estab. mg/dL   Microalbumin, Urine 114.1 Not Estab. ug/mL   Microalb/Creat Ratio 41 (H) 0 - 29 mg/g creat  TSH  Result Value Ref Range   TSH 2.680 0.450 - 4.500 uIU/mL  POCT glucose (manual entry)  Result Value Ref Range   POC Glucose 225 (A) 70 - 99 mg/dl  POCT glycosylated hemoglobin (Hb A1C)  Result Value Ref Range    Hemoglobin A1C     HbA1c POC (<> result, manual entry)     HbA1c, POC (prediabetic range)     HbA1c, POC (controlled diabetic range) 9.8 (A) 0.0 - 7.0 %  Assessment and Plan: 1. Type 2 diabetes mellitus without complication, without long-term current use of insulin ([place]) I do not have a good sense of how her blood sugars are running as patient was a little bit vague in answering questions about the blood sugar.  She tells me that her blood sugar during lunch is 180.  I tried to get her to give me a range and also recommend that she check blood sugars either before meals or 2 hours after meal.  Recommend that she come to the lab to have A1c drawn - Hemoglobin A1c; Future  2. Essential hypertension At goal.  Continue current medications - amLODipine (NORVASC) 10 MG tablet; Take 1 tablet (10 mg total) by mouth daily.  Dispense: 90 tablet; Refill: 1 - Basic Metabolic Panel; Future  3. Hypothyroidism, unspecified type - levothyroxine (SYNTHROID) 50 MCG tablet; Take 1 tablet (50 mcg total) by mouth daily.  Dispense: 30 tablet; Refill: 5  4. Hyperlipidemia associated with type 2 diabetes mellitus (HCC) Continue atorvastatin.  We will recheck lipid profile - Lipid panel; Future  5. Need for influenza vaccination 6. Need for Tdap vaccination Patient will come to have flu shot and Tdap given by clinical pharmacist.   Follow Up Instructions: Follow-up with me in 3 months. She will come in on November 1 at 9 AM to have her flu shot and Tdap and to have blood test done.   I discussed the assessment and treatment plan with the patient. The patient was provided an opportunity to ask questions and all were answered. The patient agreed with the plan and demonstrated an understanding of the instructions.   The patient was advised to call back or seek an in-person evaluation if the symptoms worsen or if the condition fails to improve as anticipated.  I provided 14 minutes of non-face-to-face  time during this encounter.   Karle Plumber, MD

## 2020-08-09 DIAGNOSIS — F331 Major depressive disorder, recurrent, moderate: Secondary | ICD-10-CM | POA: Diagnosis not present

## 2020-08-18 ENCOUNTER — Encounter: Payer: Self-pay | Admitting: Internal Medicine

## 2020-08-21 ENCOUNTER — Other Ambulatory Visit: Payer: Self-pay

## 2020-08-21 ENCOUNTER — Ambulatory Visit: Payer: Medicaid Other | Attending: Internal Medicine

## 2020-08-21 DIAGNOSIS — D751 Secondary polycythemia: Secondary | ICD-10-CM | POA: Diagnosis not present

## 2020-08-21 DIAGNOSIS — Z114 Encounter for screening for human immunodeficiency virus [HIV]: Secondary | ICD-10-CM | POA: Diagnosis not present

## 2020-08-21 DIAGNOSIS — R945 Abnormal results of liver function studies: Secondary | ICD-10-CM | POA: Diagnosis not present

## 2020-08-21 DIAGNOSIS — E1169 Type 2 diabetes mellitus with other specified complication: Secondary | ICD-10-CM | POA: Diagnosis not present

## 2020-08-21 DIAGNOSIS — E119 Type 2 diabetes mellitus without complications: Secondary | ICD-10-CM

## 2020-08-21 DIAGNOSIS — E8809 Other disorders of plasma-protein metabolism, not elsewhere classified: Secondary | ICD-10-CM | POA: Diagnosis not present

## 2020-08-21 DIAGNOSIS — I10 Essential (primary) hypertension: Secondary | ICD-10-CM

## 2020-08-21 DIAGNOSIS — R7989 Other specified abnormal findings of blood chemistry: Secondary | ICD-10-CM

## 2020-08-21 DIAGNOSIS — E785 Hyperlipidemia, unspecified: Secondary | ICD-10-CM

## 2020-08-22 LAB — LIPID PANEL
Chol/HDL Ratio: 5.8 ratio — ABNORMAL HIGH (ref 0.0–4.4)
Cholesterol, Total: 197 mg/dL (ref 100–199)
HDL: 34 mg/dL — ABNORMAL LOW (ref >39)
LDL Chol Calc (NIH): 126 mg/dL — ABNORMAL HIGH (ref 0–99)
Triglycerides: 210 mg/dL — ABNORMAL HIGH (ref 0–149)
VLDL Cholesterol Cal: 37 mg/dL (ref 5–40)

## 2020-08-22 LAB — BASIC METABOLIC PANEL
BUN/Creatinine Ratio: 13 (ref 9–23)
BUN: 10 mg/dL (ref 6–24)
CO2: 22 mmol/L (ref 20–29)
Calcium: 10.2 mg/dL (ref 8.7–10.2)
Chloride: 99 mmol/L (ref 96–106)
Creatinine, Ser: 0.79 mg/dL (ref 0.57–1.00)
GFR calc Af Amer: 95 mL/min/{1.73_m2} (ref >59)
GFR calc non Af Amer: 82 mL/min/{1.73_m2} (ref >59)
Glucose: 306 mg/dL — ABNORMAL HIGH (ref 65–99)
Potassium: 4.5 mmol/L (ref 3.5–5.2)
Sodium: 137 mmol/L (ref 134–144)

## 2020-08-22 LAB — HEMOGLOBIN A1C
Est. average glucose Bld gHb Est-mCnc: 243 mg/dL
Hgb A1c MFr Bld: 10.1 % — ABNORMAL HIGH (ref 4.8–5.6)

## 2020-08-22 LAB — HEPATIC FUNCTION PANEL
ALT: 47 IU/L — ABNORMAL HIGH (ref 0–32)
AST: 36 IU/L (ref 0–40)
Albumin: 4.9 g/dL (ref 3.8–4.9)
Alkaline Phosphatase: 112 IU/L (ref 44–121)
Bilirubin Total: 0.5 mg/dL (ref 0.0–1.2)
Bilirubin, Direct: 0.1 mg/dL (ref 0.00–0.40)
Total Protein: 8.7 g/dL — ABNORMAL HIGH (ref 6.0–8.5)

## 2020-08-22 LAB — PTH, INTACT AND CALCIUM: PTH: 28 pg/mL (ref 15–65)

## 2020-08-22 LAB — HEPATITIS C ANTIBODY: Hep C Virus Ab: 0.1 s/co ratio (ref 0.0–0.9)

## 2020-08-22 LAB — HIV ANTIBODY (ROUTINE TESTING W REFLEX): HIV Screen 4th Generation wRfx: NONREACTIVE

## 2020-08-22 LAB — ERYTHROPOIETIN: Erythropoietin: 9.1 m[IU]/mL (ref 2.6–18.5)

## 2020-08-23 ENCOUNTER — Telehealth: Payer: Self-pay | Admitting: Internal Medicine

## 2020-08-23 ENCOUNTER — Other Ambulatory Visit: Payer: Self-pay | Admitting: Internal Medicine

## 2020-08-23 DIAGNOSIS — E1169 Type 2 diabetes mellitus with other specified complication: Secondary | ICD-10-CM

## 2020-08-23 DIAGNOSIS — E119 Type 2 diabetes mellitus without complications: Secondary | ICD-10-CM

## 2020-08-23 MED ORDER — INSULIN GLARGINE 100 UNIT/ML SOLOSTAR PEN: 10.0000 [IU] | PEN_INJECTOR | Freq: Every day | SUBCUTANEOUS | 11 refills | Status: DC

## 2020-08-23 MED ORDER — PEN NEEDLES 31G X 8 MM MISC: 6 refills | Status: DC

## 2020-08-23 MED ORDER — ATORVASTATIN CALCIUM 40 MG PO TABS: 60.0000 mg | ORAL_TABLET | Freq: Every day | ORAL | 3 refills | Status: DC

## 2020-08-23 NOTE — Telephone Encounter (Signed)
Phone call placed to patient this morning to go over her lab results.  Patient informed that her A1c is 10.1 and her blood sugar was 306.  Both indicates that her diabetes is not well controlled.  Discussed and encourage healthy eating habits.  She is on Metformin 1 g twice a day and reports compliance with taking the medication.  I recommend adding Lantus insulin 10 units at bedtime to help better control her diabetes.  Patient agreeable to starting the insulin.  She will come in on the 11th of this month for teaching from our clinical pharmacist.  She needs to be given the flu and tetanus vaccine the same day.   The LDL cholesterol is 126 with goal being less than 70.  Patient reports compliance with taking atorvastatin.  I recommend increasing the dose from 40 mg daily to 60 mg daily.  Patient informed that I have sent the remainder of her lab results to her MyChart account.  Results for orders placed or performed in visit on 47/82/95  Basic Metabolic Panel  Result Value Ref Range   Glucose 306 (H) 65 - 99 mg/dL   BUN 10 6 - 24 mg/dL   Creatinine, Ser 0.79 0.57 - 1.00 mg/dL   GFR calc non Af Amer 82 >59 mL/min/1.73   GFR calc Af Amer 95 >59 mL/min/1.73   BUN/Creatinine Ratio 13 9 - 23   Sodium 137 134 - 144 mmol/L   Potassium 4.5 3.5 - 5.2 mmol/L   Chloride 99 96 - 106 mmol/L   CO2 22 20 - 29 mmol/L   Calcium 10.2 8.7 - 10.2 mg/dL  Lipid panel  Result Value Ref Range   Cholesterol, Total 197 100 - 199 mg/dL   Triglycerides 210 (H) 0 - 149 mg/dL   HDL 34 (L) >39 mg/dL   VLDL Cholesterol Cal 37 5 - 40 mg/dL   LDL Chol Calc (NIH) 126 (H) 0 - 99 mg/dL   Chol/HDL Ratio 5.8 (H) 0.0 - 4.4 ratio  Hemoglobin A1c  Result Value Ref Range   Hgb A1c MFr Bld 10.1 (H) 4.8 - 5.6 %   Est. average glucose Bld gHb Est-mCnc 243 mg/dL  Hepatitis C Antibody  Result Value Ref Range   Hep C Virus Ab 0.1 0.0 - 0.9 s/co ratio  Hepatic Function Panel  Result Value Ref Range   Total Protein 8.7 (H) 6.0 -  8.5 g/dL   Albumin 4.9 3.8 - 4.9 g/dL   Bilirubin Total 0.5 0.0 - 1.2 mg/dL   Bilirubin, Direct 0.10 0.00 - 0.40 mg/dL   Alkaline Phosphatase 112 44 - 121 IU/L   AST 36 0 - 40 IU/L   ALT 47 (H) 0 - 32 IU/L  Erythropoietin  Result Value Ref Range   Erythropoietin 9.1 2.6 - 18.5 mIU/mL  HIV Antibody (routine testing w rflx)  Result Value Ref Range   HIV Screen 4th Generation wRfx Non Reactive Non Reactive  PTH, Intact and Calcium  Result Value Ref Range   PTH 28 15 - 65 pg/mL   PTH Interp Comment

## 2020-08-31 ENCOUNTER — Encounter: Payer: Self-pay | Admitting: Internal Medicine

## 2020-09-08 DIAGNOSIS — F330 Major depressive disorder, recurrent, mild: Secondary | ICD-10-CM | POA: Diagnosis not present

## 2020-09-18 ENCOUNTER — Encounter: Payer: Self-pay | Admitting: Internal Medicine

## 2020-09-18 DIAGNOSIS — G8929 Other chronic pain: Secondary | ICD-10-CM

## 2020-09-18 DIAGNOSIS — M545 Low back pain, unspecified: Secondary | ICD-10-CM

## 2020-10-24 ENCOUNTER — Other Ambulatory Visit: Payer: Self-pay | Admitting: Internal Medicine

## 2020-10-24 DIAGNOSIS — I10 Essential (primary) hypertension: Secondary | ICD-10-CM

## 2020-10-24 DIAGNOSIS — E119 Type 2 diabetes mellitus without complications: Secondary | ICD-10-CM

## 2020-10-26 ENCOUNTER — Other Ambulatory Visit: Payer: Self-pay | Admitting: Internal Medicine

## 2020-10-26 DIAGNOSIS — I10 Essential (primary) hypertension: Secondary | ICD-10-CM

## 2020-10-31 ENCOUNTER — Ambulatory Visit: Payer: Medicaid Other | Attending: Internal Medicine | Admitting: Internal Medicine

## 2020-10-31 ENCOUNTER — Encounter: Payer: Self-pay | Admitting: Internal Medicine

## 2020-10-31 ENCOUNTER — Ambulatory Visit (HOSPITAL_BASED_OUTPATIENT_CLINIC_OR_DEPARTMENT_OTHER): Payer: Medicaid Other | Admitting: Pharmacist

## 2020-10-31 ENCOUNTER — Ambulatory Visit: Payer: Medicaid Other | Admitting: Internal Medicine

## 2020-10-31 ENCOUNTER — Other Ambulatory Visit: Payer: Self-pay

## 2020-10-31 VITALS — BP 126/83 | HR 78 | Temp 98.5°F | Resp 16 | Ht 67.5 in | Wt 190.0 lb

## 2020-10-31 DIAGNOSIS — Z79899 Other long term (current) drug therapy: Secondary | ICD-10-CM | POA: Insufficient documentation

## 2020-10-31 DIAGNOSIS — E1169 Type 2 diabetes mellitus with other specified complication: Secondary | ICD-10-CM | POA: Diagnosis not present

## 2020-10-31 DIAGNOSIS — Z794 Long term (current) use of insulin: Secondary | ICD-10-CM | POA: Diagnosis not present

## 2020-10-31 DIAGNOSIS — M545 Low back pain, unspecified: Secondary | ICD-10-CM | POA: Diagnosis not present

## 2020-10-31 DIAGNOSIS — E039 Hypothyroidism, unspecified: Secondary | ICD-10-CM | POA: Insufficient documentation

## 2020-10-31 DIAGNOSIS — Z880 Allergy status to penicillin: Secondary | ICD-10-CM | POA: Insufficient documentation

## 2020-10-31 DIAGNOSIS — Z7984 Long term (current) use of oral hypoglycemic drugs: Secondary | ICD-10-CM | POA: Diagnosis not present

## 2020-10-31 DIAGNOSIS — Z23 Encounter for immunization: Secondary | ICD-10-CM | POA: Insufficient documentation

## 2020-10-31 DIAGNOSIS — Z79890 Hormone replacement therapy (postmenopausal): Secondary | ICD-10-CM | POA: Insufficient documentation

## 2020-10-31 DIAGNOSIS — E119 Type 2 diabetes mellitus without complications: Secondary | ICD-10-CM

## 2020-10-31 DIAGNOSIS — I10 Essential (primary) hypertension: Secondary | ICD-10-CM

## 2020-10-31 DIAGNOSIS — E1129 Type 2 diabetes mellitus with other diabetic kidney complication: Secondary | ICD-10-CM | POA: Diagnosis not present

## 2020-10-31 DIAGNOSIS — Z6825 Body mass index (BMI) 25.0-25.9, adult: Secondary | ICD-10-CM | POA: Insufficient documentation

## 2020-10-31 DIAGNOSIS — R809 Proteinuria, unspecified: Secondary | ICD-10-CM | POA: Diagnosis not present

## 2020-10-31 DIAGNOSIS — E782 Mixed hyperlipidemia: Secondary | ICD-10-CM | POA: Diagnosis not present

## 2020-10-31 DIAGNOSIS — E785 Hyperlipidemia, unspecified: Secondary | ICD-10-CM

## 2020-10-31 DIAGNOSIS — G8929 Other chronic pain: Secondary | ICD-10-CM | POA: Diagnosis not present

## 2020-10-31 DIAGNOSIS — Z560 Unemployment, unspecified: Secondary | ICD-10-CM | POA: Diagnosis not present

## 2020-10-31 DIAGNOSIS — Z791 Long term (current) use of non-steroidal anti-inflammatories (NSAID): Secondary | ICD-10-CM | POA: Insufficient documentation

## 2020-10-31 DIAGNOSIS — E663 Overweight: Secondary | ICD-10-CM | POA: Insufficient documentation

## 2020-10-31 DIAGNOSIS — G44209 Tension-type headache, unspecified, not intractable: Secondary | ICD-10-CM | POA: Insufficient documentation

## 2020-10-31 DIAGNOSIS — Z1211 Encounter for screening for malignant neoplasm of colon: Secondary | ICD-10-CM

## 2020-10-31 DIAGNOSIS — Z1231 Encounter for screening mammogram for malignant neoplasm of breast: Secondary | ICD-10-CM

## 2020-10-31 LAB — GLUCOSE, POCT (MANUAL RESULT ENTRY): POC Glucose: 125 mg/dl — AB (ref 70–99)

## 2020-10-31 NOTE — Progress Notes (Signed)
Patient ID: AVRI PAIVA, female    DOB: Oct 05, 1961  MRN: [id number]  CC: Chronic disease management.  Subjective: [name] is a 60 y.o. female who presents for chronic disease management. Her concerns today include:  HTN, HL, depression, hypothyroid, DM, tension HA, chronic LBP (minimal degen disc ds on MRI).  DM/obesity: loss 13 lbs since 01/2020. Reports she is not eating as much and moving more.  Goes to grocery store and walks around and also walks outside when weather permits.  Drinking more water, no soda in 2 mths.  Back not bothering her as much. Last TSH level was in April of last year. She reports compliance with taking levothyroxine. -A1C 2 mths ago was 10.1. I had spoken with her via phone about adding daily dose of Lantus insulin. She was supposed to come in for teaching with the clinical pharmacist but had to cancel the appointment and was not able to make it in until today. She has already received the insulin pen from her pharmacy. She has not started taking it yet. Has glucometer Accu chek Guide Me but stripes are for Accu-chek Aviva Plus so she has not been able to check blood sugars. -compliant with Metformin  Results for orders placed or performed in visit on 10/31/20  POCT glucose (manual entry)  Result Value Ref Range   POC Glucose 125 (A) 70 - 99 mg/dl   Lab Results  Component Value Date   HGBA1C 10.1 (H) 08/21/2020   HL: LDL was not at goal when last checked 2 months ago. I had recommended increasing Lipitor to 60 mg. This means she was supposed to be taking one half of the 40 mg tablet. However she states that she was still taking just 1 tablet daily.  HTN:  Compliant with Norvasc and Lisinopril.  Limits salt in foods.  No CP/SOB/LE edema  Tension HA.  No longer taking Depakote. Requests that it be moved from her med list.  HM:  Agree to get flu, Tdapt and Pneunovax.  Has FIT test at home but has not used as yet.  Completed 2 shots of Pfizer COVID-19  vaccine about 2 mths ago.  Not due for boosters yet.  She does not have a vaccine card with her but will send me the information via MyChart. Patient Active Problem List   Diagnosis Date Noted  . Influenza vaccine needed 10/31/2020  . Need for diphtheria-tetanus-pertussis (Tdap) vaccine 10/31/2020  . Type 2 diabetes mellitus without complication, without long-term current use of insulin ([place]) 07/31/2020  . OSA (obstructive sleep apnea) 02/02/2020  . Spondylosis without myelopathy or radiculopathy, lumbar region 09/17/2017  . Essential hypertension 09/05/2017  . Chronic bilateral low back pain with left-sided sciatica 09/05/2017  . HTN (hypertension) 05/05/2017  . Hypothyroidism 02/24/2017  . Mixed hyperlipidemia 02/24/2017  . 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 Sleep related headaches 01/23/2017  . Snoring 01/23/2017  . 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 Chronic headaches 10/30/2016  . Obesity (BMI 30.0-34.9) 05/24/2016  . PMB (postmenopausal bleeding) 05/24/2016  . Back pain of lumbar region with sciatica 09/28/2015     Current Outpatient Medications on File Prior to Visit  Medication Sig Dispense Refill  . Accu-Chek FastClix Lancets MISC Use as instructed to check blood sugar daily. E11.9 102 each 6  . acetaminophen (TYLENOL) 325 MG tablet Take 2 tablets (650 mg total) by mouth every 6 (six) hours as needed for moderate pain. 30 tablet   . amitriptyline (ELAVIL)  25 MG tablet Take 1 tablet (25 mg total) by mouth at bedtime. 30 tablet 0  . amLODipine (NORVASC) 10 MG tablet Take 1 tablet (10 mg total) by mouth daily. 90 tablet 0  . atorvastatin (LIPITOR) 40 MG tablet Take 1.5 tablets (60 mg total) by mouth daily. 135 tablet 3  . Blood Glucose Monitoring Suppl (ACCU-CHEK GUIDE ME) w/Device KIT 1 kit by Does not apply route daily. Use as instructed to check blood sugar daily. E11.9 1 kit 0  . Blood Pressure Monitor DEVI Use as directed to check home blood pressure 2-3 times a  week 1 Device 0  . Cholecalciferol (VITAMIN D) 2000 units tablet Take 2,000 Units by mouth daily.    Marland Kitchen escitalopram (LEXAPRO) 10 MG tablet Take 10 mg by mouth at bedtime.  2  . glucose blood (ACCU-CHEK GUIDE) test strip USE AS DIRECTED TO check blood sugar daily 100 strip 6  . insulin glargine (LANTUS) 100 UNIT/ML Solostar Pen Inject 10 Units into the skin daily. 15 mL 11  . Insulin Pen Needle (PEN NEEDLES) 31G X 8 MM MISC UAD 100 each 6  . levothyroxine (SYNTHROID) 50 MCG tablet Take 1 tablet (50 mcg total) by mouth daily. 30 tablet 5  . lisinopril (ZESTRIL) 20 MG tablet Take 1 tablet (20 mg total) by mouth daily. 90 tablet 0  . meloxicam (MOBIC) 15 MG tablet Take 1 tablet (15 mg total) by mouth daily. Take with food 30 tablet 2  . metFORMIN (GLUCOPHAGE) 1000 MG tablet Take 1 tablet (1,000 mg total) by mouth 2 (two) times daily with a meal. 180 tablet 0  . methocarbamol (ROBAXIN) 500 MG tablet Take 1 tablet (500 mg total) by mouth every 8 (eight) hours as needed for muscle spasms. 60 tablet 0  . Omega-3 Fatty Acids (FISH OIL) 1000 MG CAPS Take 1,000 mg by mouth 2 (two) times daily.    . propranolol (INDERAL) 80 MG tablet Take 1 tablet (80 mg total) by mouth 2 (two) times daily. 180 tablet 0   No current facility-administered medications on file prior to visit.    Allergies  Allergen Reactions  . Iron Diarrhea  . Penicillins Nausea And Vomiting    Has patient had a PCN reaction causing immediate rash, facial/tongue/throat swelling, SOB or lightheadedness with hypotension: No Has patient had a PCN reaction causing severe rash involving mucus membranes or skin necrosis: No Has patient had a PCN reaction that required hospitalization No Has patient had a PCN reaction occurring within the last 10 years: No If all of the above answers are "NO", then may proceed with Cephalosporin use.    Social History   Socioeconomic History  . Marital status: Single    Spouse name: Not on file  . Number  of children: 1  . Years of education: 12  . Highest education level: Not on file  Occupational History  . Occupation: Unemployed  Tobacco Use  . Smoking status: Never Smoker  . Smokeless tobacco: Never Used  Substance and Sexual Activity  . Alcohol use: No    Alcohol/week: 0.0 standard drinks  . Drug use: No  . Sexual activity: Not Currently    Partners: Male  Other Topics Concern  . Not on file  Social History Narrative   Lives with son, Shanon Brow.   Caffeine use: Daily       Left handed   Social Determinants of Health   Financial Resource Strain: Not on file  Food Insecurity: Not on file  Transportation Needs: Not on file  Physical Activity: Not on file  Stress: Not on file  Social Connections: Not on file  Intimate Partner Violence: Not on file    Family History  Problem Relation Age of Onset  . Headache Neg Hx   . Colon cancer Neg Hx   . Esophageal cancer Neg Hx   . Rectal cancer Neg Hx   . Stomach cancer Neg Hx   . Breast cancer Neg Hx     Past Surgical History:  Procedure Laterality Date  . BREAST CYST EXCISION Right 1980  . BREAST CYST EXCISION Left 1985  . NO PAST SURGERIES      ROS: Review of Systems Negative except as stated above  PHYSICAL EXAM: BP 126/83   Pulse 78   Temp 98.5 F (36.9 C)   Resp 16   Ht 5' 7.5" (1.715 m)   Wt 190 lb (86.2 kg)   LMP 06/06/2015   SpO2 97%   BMI 29.32 kg/m   Wt Readings from Last 3 Encounters:  10/31/20 190 lb (86.2 kg)  01/31/20 203 lb 3.2 oz (92.2 kg)  08/24/18 208 lb 12.8 oz (94.7 kg)    Physical Exam  General appearance - alert, well appearing, and in no distress Mental status - normal mood, behavior, speech, dress, motor activity, and thought processes Chest - clear to auscultation, no wheezes, rales or rhonchi, symmetric air entry Heart - normal rate, regular rhythm, normal S1, S2, no murmurs, rubs, clicks or gallops Extremities - peripheral pulses normal, no pedal edema, no clubbing or  cyanosis   CMP Latest Ref Rng & Units 08/21/2020 01/31/2020 02/12/2018  Glucose 65 - 99 mg/dL 306(H) 186(H) 211(H)  BUN 6 - 24 mg/dL 10 12 14   Creatinine 0.57 - 1.00 mg/dL 0.79 0.86 0.94  Sodium 134 - 144 mmol/L 137 139 139  Potassium 3.5 - 5.2 mmol/L 4.5 4.7 4.1  Chloride 96 - 106 mmol/L 99 97 101  CO2 20 - 29 mmol/L 22 24 23   Calcium 8.7 - 10.2 mg/dL 10.2 10.4(H) 9.1  Total Protein 6.0 - 8.5 g/dL 8.7(H) 9.4(H) 8.6(H)  Total Bilirubin 0.0 - 1.2 mg/dL 0.5 0.5 <0.2  Alkaline Phos 44 - 121 IU/L 112 133(H) 101  AST 0 - 40 IU/L 36 44(H) 22  ALT 0 - 32 IU/L 47(H) 53(H) 29   Lipid Panel     Component Value Date/Time   CHOL 197 08/21/2020 0830   TRIG 210 (H) 08/21/2020 0830   HDL 34 (L) 08/21/2020 0830   CHOLHDL 5.8 (H) 08/21/2020 0830   CHOLHDL 9.2 (H) 09/28/2015 1420   VLDL 61 (H) 09/28/2015 1420   LDLCALC 126 (H) 08/21/2020 0830    CBC    Component Value Date/Time   WBC 9.2 01/31/2020 1515   WBC 5.7 05/06/2017 0144   RBC 5.18 01/31/2020 1515   RBC 4.37 05/06/2017 0144   HGB 16.4 (H) 01/31/2020 1515   HCT 48.2 (H) 01/31/2020 1515   PLT 337 01/31/2020 1515   MCV 93 01/31/2020 1515   MCH 31.7 01/31/2020 1515   MCH 30.7 05/06/2017 0144   MCHC 34.0 01/31/2020 1515   MCHC 33.5 05/06/2017 0144   RDW 12.9 01/31/2020 1515   LYMPHSABS 1.8 05/05/2017 1108   LYMPHSABS 2.4 02/17/2017 1141   MONOABS 0.7 05/05/2017 1108   EOSABS 0.1 05/05/2017 1108   EOSABS 0.1 02/17/2017 1141   BASOSABS 0.0 05/05/2017 1108   BASOSABS 0.1 02/17/2017 1141    ASSESSMENT AND PLAN: 1.  Type 2 diabetes mellitus with microalbuminuria, without long-term current use of insulin [facility]) Clinical pharmacist to meet with patient today to do teaching for Lantus pen.  I had written the prescription for 10 units daily but in light of her weight loss, we will have her start with just 7 units daily.  I went over signs and symptoms of hypoglycemia and how to treat should it occur. -Commended her on changes made in  eating habits.  Encouraged her to keep up the good work and continue trying to move more. -Clinical pharmacist will also send new prescription to her pharmacy for glucometer to match the strips that she has. - POCT glucose (manual entry) - CBC  2. Essential hypertension Close to goal.  Continue amlodipine, lisinopril and propranolol.  3. Hyperlipidemia associated with type 2 diabetes mellitus ([place]) LDL was not at goal.  Advised her to increase the atorvastatin to 60 mg.  This means she should be taking 1/2 tablet/day of the 40 mg tablet.  4. Overweight (BMI 25.0-29.9) See #1 above  5. Acquired hypothyroidism Continue levothyroxine - TSH  6. Influenza vaccine needed Given  7. Screening for colon cancer Encourage patient to use the fit test and mail it or bring it in  8. Need for vaccination against Streptococcus pneumoniae Given  9. Encounter for screening mammogram for malignant neoplasm of breast - MM Digital Screening; Future   Patient was given the opportunity to ask questions.  Patient verbalized understanding of the plan and was able to repeat key elements of the plan.   Orders Placed This Encounter  Procedures  . MM Digital Screening  . TSH  . CBC  . POCT glucose (manual entry)     Requested Prescriptions    No prescriptions requested or ordered in this encounter    Return in about 4 months (around 02/28/2021) for Give appt with [facility] in 3 weeks for BS diabetes/blood sugar check.  Karle Plumber, MD, FACP

## 2020-10-31 NOTE — Patient Instructions (Addendum)
Start the Lantus insulin as discussed. However start with taking just 7 units daily. Check your blood sugar at least twice a day before breakfast and dinner and record the readings. Bring those readings in with you in about 3 weeks to meet with the clinical pharmacist again for review.   Preventing Hypoglycemia Hypoglycemia occurs when the level of sugar (glucose) in the blood is too low. Hypoglycemia can happen in people who do or do not have diabetes (diabetes mellitus). It can develop quickly, and it can be a medical emergency. For most people with diabetes, a blood glucose level below 70 mg/dL (3.9 mmol/L) is considered hypoglycemia. Glucose is a type of sugar that provides the body's main source of energy. Certain hormones (insulin and glucagon) control the level of glucose in the blood. Insulin lowers blood glucose, and glucagon increases blood glucose. Hypoglycemia can result from having too much insulin in the bloodstream, or from not eating enough food that contains glucose. Your risk for hypoglycemia is higher:  If you take insulin or diabetes medicines to help lower your blood glucose or help your body make more insulin.  If you skip or delay a meal or snack.  If you are ill.  During and after exercise. You can prevent hypoglycemia by working with your health care provider to adjust your meal plan as needed and by taking other precautions. How can hypoglycemia affect me? Mild symptoms Mild hypoglycemia may not cause any symptoms. If you do have symptoms, they may include:  Hunger.  Anxiety.  Sweating and feeling clammy.  Dizziness or feeling light-headed.  Sleepiness.  Nausea.  Increased heart rate.  Headache.  Blurry vision.  Irritability.  Tingling or numbness around the mouth, lips, or tongue.  A change in coordination.  Restless sleep. If mild hypoglycemia is not recognized and treated, it can quickly become moderate or severe hypoglycemia. Moderate  symptoms Moderate hypoglycemia can cause:  Mental confusion and poor judgment.  Behavior changes.  Weakness.  Irregular heartbeat. Severe symptoms Severe hypoglycemia is a medical emergency. It can cause:  Fainting.  Seizures.  Loss of consciousness (coma).  Death. What nutrition changes can be made?  Work with your health care provider or diet and nutrition specialist (dietitian) to make a healthy meal plan that is right for you. Follow your meal plan carefully.  Eat meals at regular times.  If recommended by your health care provider, have snacks between meals.  Donot skip or delay meals or snacks. You can be at risk for hypoglycemia if you are not getting enough carbohydrates. What lifestyle changes can be made?  Work closely with your health care provider to manage your blood glucose. Make sure you know: ? Your goal blood glucose levels. ? How and when to check your blood glucose. ? The symptoms of hypoglycemia. It is important to treat it right away to keep it from becoming severe.  Do not drink alcohol on an empty stomach.  When you are ill, check your blood glucose more often than usual. Follow your sick day plan whenever you cannot eat or drink normally. Make this plan in advance with your health care provider.  Always check your blood glucose before, during, and after exercise.   How is this treated? This condition can often be treated by immediately eating or drinking something that contains sugar, such as:  Fruit juice, 4-6 oz (120-150 mL).  Regular (not diet) soda, 4-6 oz (120-150 mL).  Low-fat milk, 4 oz (120 mL).  Several pieces  of hard candy.  Sugar or honey, 1 Tbsp (15 mL). Treating hypoglycemia if you have diabetes If you are alert and able to swallow safely, follow the 15:15 rule:  Take 15 grams of a rapid-acting carbohydrate. Talk with your health care provider about how much you should take.  Rapid-acting options include: ? Glucose pills  (take 15 grams). ? 6-8 pieces of hard candy. ? 4-6 oz (120-150 mL) of fruit juice. ? 4-6 oz (120-150 mL) of regular (not diet) soda.  Check your blood glucose 15 minutes after you take the carbohydrate.  If the repeat blood glucose level is still at or below 70 mg/dL (3.9 mmol/L), take 15 grams of a carbohydrate again.  If your blood glucose level does not increase above 70 mg/dL (3.9 mmol/L) after 3 tries, seek emergency medical care.  After your blood glucose level returns to normal, eat a meal or a snack within 1 hour. Treating severe hypoglycemia Severe hypoglycemia is when your blood glucose level is at or below 54 mg/dL (3 mmol/L). Severe hypoglycemia is a medical emergency. Get medical help right away. If you have severe hypoglycemia and you cannot eat or drink, you may need an injection of glucagon. A family member or close friend should learn how to check your blood glucose and how to give you a glucagon injection. Ask your health care provider if you need to have an emergency glucagon injection kit available. Severe hypoglycemia may need to be treated in a hospital. The treatment may include getting glucose through an IV. You may also need treatment for the cause of your hypoglycemia. Where to find more information  American Diabetes Association: www.diabetes.[employer] of Diabetes and Digestive and Kidney Diseases: [employer] Contact a health care provider if:  You have problems keeping your blood glucose in your target range.  You have frequent episodes of hypoglycemia. Get help right away if:  You continue to have hypoglycemia symptoms after eating or drinking something containing glucose.  Your blood glucose level is at or below 54 mg/dL (3 mmol/L).  You faint.  You have a seizure. These symptoms may represent a serious problem that is an emergency. Do not wait to see if the symptoms will go away. Get medical help right away. Call your local  emergency services (911 in the [place] Summary  Know the symptoms of hypoglycemia, and when you are at risk for it (such as during exercise or when you are sick). Check your blood glucose often when you are at risk for hypoglycemia.  Hypoglycemia can develop quickly, and it can be dangerous if it is not treated right away. If you have a history of severe hypoglycemia, make sure you know how to use your glucagon injection kit.  Make sure you know how to treat hypoglycemia. Keep a carbohydrate snack available when you may be at risk for hypoglycemia. This information is not intended to replace advice given to you by your health care provider. Make sure you discuss any questions you have with your health care provider. Document Revised: 01/29/2019 Document Reviewed: 06/04/2017 Elsevier Patient Education  [place].    Influenza Virus Vaccine injection (Fluarix) What is this medicine? INFLUENZA VIRUS VACCINE (in floo EN zuh VAHY ruhs vak SEEN) helps to reduce the risk of getting influenza also known as the flu. This medicine may be used for other purposes; ask your health care provider or pharmacist if you have questions. COMMON BRAND NAME(S): Fluarix, Fluzone What should I tell my health  care provider before I take this medicine? They need to know if you have any of these conditions:  bleeding disorder like hemophilia  fever or infection  Guillain-Barre syndrome or other neurological problems  immune system problems  infection with the human immunodeficiency virus (HIV) or AIDS  low blood platelet counts  multiple sclerosis  an unusual or allergic reaction to influenza virus vaccine, eggs, chicken proteins, latex, gentamicin, other medicines, foods, dyes or preservatives  pregnant or trying to get pregnant  breast-feeding How should I use this medicine? This vaccine is for injection into a muscle. It is given by a health care professional. A copy of Vaccine Information  Statements will be given before each vaccination. Read this sheet carefully each time. The sheet may change frequently. Talk to your pediatrician regarding the use of this medicine in children. Special care may be needed. Overdosage: If you think you have taken too much of this medicine contact a poison control center or emergency room at once. NOTE: This medicine is only for you. Do not share this medicine with others. What if I miss a dose? This does not apply. What may interact with this medicine?  chemotherapy or radiation therapy  medicines that lower your immune system like etanercept, anakinra, infliximab, and adalimumab  medicines that treat or prevent blood clots like warfarin  phenytoin  steroid medicines like prednisone or cortisone  theophylline  vaccines This list may not describe all possible interactions. Give your health care provider a list of all the medicines, herbs, non-prescription drugs, or dietary supplements you use. Also tell them if you smoke, drink alcohol, or use illegal drugs. Some items may interact with your medicine. What should I watch for while using this medicine? Report any side effects that do not go away within 3 days to your doctor or health care professional. Call your health care provider if any unusual symptoms occur within 6 weeks of receiving this vaccine. You may still catch the flu, but the illness is not usually as bad. You cannot get the flu from the vaccine. The vaccine will not protect against colds or other illnesses that may cause fever. The vaccine is needed every year. What side effects may I notice from receiving this medicine? Side effects that you should report to your doctor or health care professional as soon as possible:  allergic reactions like skin rash, itching or hives, swelling of the face, lips, or tongue Side effects that usually do not require medical attention (report to your doctor or health care professional if they  continue or are bothersome):  fever  headache  muscle aches and pains  pain, tenderness, redness, or swelling at site where injected  weak or tired This list may not describe all possible side effects. Call your doctor for medical advice about side effects. You may report side effects to FDA at 1-800-FDA-1088. Where should I keep my medicine? This vaccine is only given in a clinic, pharmacy, doctor's office, or other health care setting and will not be stored at home. NOTE: This sheet is a summary. It may not cover all possible information. If you have questions about this medicine, talk to your doctor, pharmacist, or health care provider.  2021 Elsevier/Gold Standard (2008-05-04 09:30:40)

## 2020-10-31 NOTE — Progress Notes (Signed)
Patient presents for vaccination against influenza and strep pneumo per orders of Dr. Johnson. Consent given. Counseling provided. No contraindications exists. Vaccine administered without incident.  ° °Luke Van Ausdall, PharmD, BCACP, CPP °Clinical Pharmacist °[facility] °[phone] ° °

## 2020-11-01 ENCOUNTER — Other Ambulatory Visit: Payer: Self-pay | Admitting: Pharmacist

## 2020-11-01 ENCOUNTER — Encounter: Payer: Self-pay | Admitting: Internal Medicine

## 2020-11-01 DIAGNOSIS — F331 Major depressive disorder, recurrent, moderate: Secondary | ICD-10-CM | POA: Diagnosis not present

## 2020-11-01 DIAGNOSIS — E119 Type 2 diabetes mellitus without complications: Secondary | ICD-10-CM

## 2020-11-01 LAB — CBC
Hematocrit: 47.5 % — ABNORMAL HIGH (ref 34.0–46.6)
Hemoglobin: 16.1 g/dL — ABNORMAL HIGH (ref 11.1–15.9)
MCH: 31.1 pg (ref 26.6–33.0)
MCHC: 33.9 g/dL (ref 31.5–35.7)
MCV: 92 fL (ref 79–97)
Platelets: 326 10*3/uL (ref 150–450)
RBC: 5.18 x10E6/uL (ref 3.77–5.28)
RDW: 13.1 % (ref 11.7–15.4)
WBC: 9 10*3/uL (ref 3.4–10.8)

## 2020-11-01 LAB — TSH: TSH: 2.63 u[IU]/mL (ref 0.450–4.500)

## 2020-11-01 MED ORDER — ACCU-CHEK GUIDE ME W/DEVICE KIT: 1.0000 | PACK | Freq: Every day | 0 refills | Status: DC

## 2020-11-01 MED ORDER — ACCU-CHEK GUIDE VI STRP: ORAL_STRIP | 6 refills | Status: DC

## 2020-11-02 DIAGNOSIS — F330 Major depressive disorder, recurrent, mild: Secondary | ICD-10-CM | POA: Diagnosis not present

## 2020-11-03 ENCOUNTER — Encounter: Payer: Self-pay | Admitting: Internal Medicine

## 2020-11-06 ENCOUNTER — Encounter: Payer: Self-pay | Admitting: Internal Medicine

## 2020-11-07 ENCOUNTER — Encounter: Payer: Self-pay | Admitting: Internal Medicine

## 2020-11-10 MED ORDER — GLUCOSE BLOOD VI STRP: ORAL_STRIP | 12 refills | Status: DC

## 2020-11-21 ENCOUNTER — Other Ambulatory Visit: Payer: Self-pay

## 2020-11-21 ENCOUNTER — Ambulatory Visit: Payer: Medicaid Other | Attending: Internal Medicine | Admitting: Pharmacist

## 2020-11-21 DIAGNOSIS — E119 Type 2 diabetes mellitus without complications: Secondary | ICD-10-CM

## 2020-11-21 DIAGNOSIS — Z23 Encounter for immunization: Secondary | ICD-10-CM | POA: Diagnosis not present

## 2020-11-21 DIAGNOSIS — Z1211 Encounter for screening for malignant neoplasm of colon: Secondary | ICD-10-CM | POA: Diagnosis not present

## 2020-11-21 LAB — POCT GLYCOSYLATED HEMOGLOBIN (HGB A1C): Hemoglobin A1C: 7.2 % — AB (ref 4.0–5.6)

## 2020-11-21 LAB — GLUCOSE, POCT (MANUAL RESULT ENTRY): POC Glucose: 155 mg/dl — AB (ref 70–99)

## 2020-11-21 MED ORDER — TRULICITY 0.75 MG/0.5ML ~~LOC~~ SOAJ: 0.7500 mg | SUBCUTANEOUS | 1 refills | Status: DC

## 2020-11-21 NOTE — Addendum Note (Signed)
Addended by: Daisy Blossom, Annie Main L on: 11/21/2020 05:30 PM   Modules accepted: Orders

## 2020-11-21 NOTE — Patient Instructions (Signed)
Thank you for coming to see me today. Please do the following:  1. Pick up Trulicity from your pharmacy. Start taking Trulicity Wednesday 04/25/8114 as directed today during your appointment. If you have any questions or if you believe something has occurred because of this change, call me or your doctor to let one of [place] know.  2. Continue taking 1 tablet of metformin with breakfast and 1 tablet with dinner.  3. Continue taking Lantus 7 units once daily in the evening. 4. Continue checking blood sugars at home. It's really important that you record these and bring these in to your next doctor's appointment. If you get in readings above 500 or lower than 70, call me or the clinic to let your doctor know. See below on how to treat low blood sugar.  5. Continue making the lifestyle changes we've discussed together during our visit. Diet and exercise play a significant role in improving your blood sugars.  6. Follow-up with me in 1 month.   Hypoglycemia or low blood sugar:   Low blood sugar can happen quickly and may become an emergency if not treated right away.   While this shouldn't happen often, it can be brought upon if you skip a meal or do not eat enough. Also, if your insulin or other diabetes medications are dosed too high, this can cause your blood sugar to go to low.   Warning signs of low blood sugar include: 1. Feeling shaky or dizzy 2. Feeling weak or tired  3. Excessive hunger 4. Feeling anxious or upset  5. Sweating even when you aren't exercising  What to do if I experience low blood sugar? 1. Check your blood sugar with your meter. If lower than 70, proceed to step 2.  2. Treat with 3-4 glucose tablets or 3 packets of regular sugar. If these aren't around, you can try hard candy. Yet another option would be to drink 4 ounces of fruit juice or 6 ounces of REGULAR soda.  3. Re-check your sugar in 15 minutes. If it is still below 70, do what you did in step 2 again. If has come back  up, go ahead and eat a snack or small meal at this time.

## 2020-11-21 NOTE — Progress Notes (Deleted)
    S:     No chief complaint on file.   Patient arrives ***.  Presents for diabetes evaluation, education, and management Patient was referred and last seen by Primary Care Provider on ***.  Patient was referred on ***.  Patient was last seen by Primary Care Provider on ***.   Patient reports Diabetes was diagnosed in ***.   Family/Social History: ***  Insurance coverage/medication affordability: ***  Medication adherence reported *** .   Current diabetes medications include: *** Current hypertension medications include: *** Current hyperlipidemia medications include: ***  Patient {Actions; denies-reports:120008} hypoglycemic events.  Patient reported dietary habits: Eats *** meals/day Breakfast:*** Lunch:*** Dinner:*** Snacks:*** Drinks:***  Patient-reported exercise habits: ***   Patient {Actions; denies-reports:120008} nocturia (nighttime urination).  Patient {Actions; denies-reports:120008} neuropathy (nerve pain). Patient {Actions; denies-reports:120008} visual changes. Patient {Actions; denies-reports:120008} self foot exams.     O:  Physical Exam   ROS   Lab Results  Component Value Date   HGBA1C 10.1 (H) 08/21/2020   There were no vitals filed for this visit.  Lipid Panel     Component Value Date/Time   CHOL 197 08/21/2020 0830   TRIG 210 (H) 08/21/2020 0830   HDL 34 (L) 08/21/2020 0830   CHOLHDL 5.8 (H) 08/21/2020 0830   CHOLHDL 9.2 (H) 09/28/2015 1420   VLDL 61 (H) 09/28/2015 1420   LDLCALC 126 (H) 08/21/2020 0830    Home fasting blood sugars: ***  2 hour post-meal/random blood sugars: ***.   Clinical Atherosclerotic Cardiovascular Disease (ASCVD): {YES/NO:21197} The 10-year ASCVD risk score Mikey Bussing DC Jr., et al., 2013) is: 10.2%   Values used to calculate the score:     Age: 60 years     Sex: Female     Is Non-Hispanic African American: No     Diabetic: Yes     Tobacco smoker: No     Systolic Blood Pressure: 188 mmHg     Is BP  treated: Yes     HDL Cholesterol: 34 mg/dL     Total Cholesterol: 197 mg/dL    A/P: Diabetes longstanding*** currently ***. Patient is *** able to verbalize appropriate hypoglycemia management plan. Medication adherence appears ***. Control is suboptimal due to ***. -{Meds adjust:18428} basal insulin *** (insulin ***). Patient will continue to titrate 1 unit every *** days if fasting blood sugar > 100mg /dl until fasting blood sugars reach goal or next visit.  -{Meds adjust:18428}  rapid insulin *** (insulin ***) to ***.  -{Meds adjust:18428} GLP-1 *** (generic name***) to ***.  -{Meds adjust:18428} SGLT2-I *** (generic name***) to ***. Counseled on sick day rules for ***. -Extensively discussed pathophysiology of diabetes, recommended lifestyle interventions, dietary effects on blood sugar control -Counseled on s/sx of and management of hypoglycemia -Next A1C anticipated ***.   ASCVD risk - primary***secondary prevention in patient with diabetes. Last LDL {Is/is not:9024} controlled. ASCVD risk score {Is/is not:9024} >20%  - {Desc; low/moderate/high:110033} intensity statin indicated. Aspirin {Is/is not:9024} indicated.  -{Meds adjust:18428} aspirin *** mg  -{Meds adjust:18428} ***statin *** mg.   Hypertension longstanding*** currently ***.  Blood pressure goal = *** mmHg. Medication adherence ***.  Blood pressure control is suboptimal due to ***. -***  Written patient instructions provided.  Total time in face to face counseling *** minutes.   Follow up Pharmacist/PCP*** Clinic Visit in ***.     Patient seen with:  Haynes Dage, PharmD Candidate  [employer]  Class of 2024

## 2020-11-21 NOTE — Progress Notes (Addendum)
S:    PCP: Dr. Wynetta Emery  Patient presents for diabetes evaluation, education, and management Patient was referred and last seen by Primary Care Provider on 10/31/2020.    Pt reports compliance with medications. She denies missed doses, hyperglycemia, and hypoglycemia. Of note, pt reports that she thinks she has regained the weight she previously lost. Pt has no home BG readings due to meter issues which were resolved today.  Family/Social History: cancer  Insurance coverage/medication affordability: Medicaid Medication adherence reported.   Current diabetes medications include: metformin 1000 mg BID, Lantus 7 units daily Current hypertension medications include: lisinopril 20 mg, amlodipine 10 mg Current hyperlipidemia medications include: atorvastatin 60 mg  Patient denies hypoglycemic events.  Patient reported dietary habits:  - pt previously reports trying to "eat better" and limit portions, but more recently she admits to dietary indiscretion with junk foods   Patient-reported exercise habits: - pt reports walking when weather permits   Patient denies nocturia (nighttime urination).  Patient denies neuropathy (nerve pain). Patient denies visual changes.   O:   POCT: 155 A1C: 7.2  ROS   Lab Results  Component Value Date   HGBA1C 7.2 (A) 11/21/2020   There were no vitals filed for this visit.  Lipid Panel     Component Value Date/Time   CHOL 197 08/21/2020 0830   TRIG 210 (H) 08/21/2020 0830   HDL 34 (L) 08/21/2020 0830   CHOLHDL 5.8 (H) 08/21/2020 0830   CHOLHDL 9.2 (H) 09/28/2015 1420   VLDL 61 (H) 09/28/2015 1420   LDLCALC 126 (H) 08/21/2020 0830      Clinical Atherosclerotic Cardiovascular Disease (ASCVD): No  The 10-year ASCVD risk score Mikey Bussing DC Jr., et al., 2013) is: 10.2%   Values used to calculate the score:     Age: 60 years     Sex: Female     Is Non-Hispanic African American: No     Diabetic: Yes     Tobacco smoker: No     Systolic  Blood Pressure: 126 mmHg     Is BP treated: Yes     HDL Cholesterol: 34 mg/dL     Total Cholesterol: 197 mg/dL    A/P: Diabetes longstanding currently close to controlled. Today her A1C was significantly improved at 7.2 (down from 10.1 on 08/21/20). Patient is able to verbalize appropriate hypoglycemia management plan. Medication adherence appears good.   Pt expresses desire to come off insulin. Pt is a good candidate for Trulicity; she is amenable to starting Trulicity. Trulicity may be preferable due to once weekly administration and weight loss. Will keep her insulin for now with the goal of discontinuing use at follow up if possible. - Start Trulicity 9.98 mg once weekly - Continue Lantus 7u daily - Continue metformin 1000 mg BID -Extensively discussed pathophysiology of diabetes, recommended lifestyle interventions, dietary effects on blood sugar control -Counseled on s/sx of and management of hypoglycemia -A1c today   ASCVD risk - primary prevention in patient with diabetes. Last LDL is not controlled. ASCVD risk score is not >20%.  - Continue atorvastatin 60 mg daily  Hypertension longstanding currently controlled.  Blood pressure goal = <130/80 mmHg. Medication adherence good.   - Lisinopril 20 mg daily - Amlodipine 10 mg daily  HM:  - Boostrix given  Written patient instructions provided.  Total time in face to face counseling 20 minutes.   Follow up Pharmacist Clinic Visit in 1 month.    Patient seen with:  Haynes Dage, PharmD  Candidate [employer] Class of 2024  Harriet Pho, PharmD PGY-1 [facility] Pharmacy Resident   11/21/2020 5:17 PM

## 2020-11-23 ENCOUNTER — Other Ambulatory Visit: Payer: Self-pay | Admitting: Internal Medicine

## 2020-11-23 DIAGNOSIS — I10 Essential (primary) hypertension: Secondary | ICD-10-CM

## 2020-11-23 LAB — FECAL OCCULT BLOOD, IMMUNOCHEMICAL: Fecal Occult Bld: NEGATIVE

## 2020-12-06 DIAGNOSIS — F330 Major depressive disorder, recurrent, mild: Secondary | ICD-10-CM | POA: Diagnosis not present

## 2020-12-11 ENCOUNTER — Encounter: Payer: Self-pay | Admitting: Internal Medicine

## 2020-12-18 ENCOUNTER — Other Ambulatory Visit: Payer: Self-pay | Admitting: Internal Medicine

## 2020-12-18 ENCOUNTER — Encounter: Payer: Self-pay | Admitting: Internal Medicine

## 2020-12-18 NOTE — Telephone Encounter (Signed)
Requested Prescriptions  Pending Prescriptions Disp Refills  . TRULICITY 8.84 ZY/6.0YT SOPN [Pharmacy Med Name: Trulicity 0.16 WF/0.9 mL subcutaneous pen injector] 2 mL 1    Sig: Inject 0.75 mg into the skin once a week.     Endocrinology:  Diabetes - GLP-1 Receptor Agonists Passed - 12/18/2020 11:30 AM      Passed - HBA1C is between 0 and 7.9 and within 180 days    Hemoglobin A1C  Date Value Ref Range Status  11/21/2020 7.2 (A) 4.0 - 5.6 % Final   HbA1c, POC (prediabetic range)  Date Value Ref Range Status  08/24/2018 6.1 5.7 - 6.4 % Final   HbA1c, POC (controlled diabetic range)  Date Value Ref Range Status  01/31/2020 9.8 (A) 0.0 - 7.0 % Final   Hgb A1c MFr Bld  Date Value Ref Range Status  08/21/2020 10.1 (H) 4.8 - 5.6 % Final    Comment:             Prediabetes: 5.7 - 6.4          Diabetes: >6.4          Glycemic control for adults with diabetes: <7.0          Passed - Valid encounter within last 6 months    Recent Outpatient Visits          3 weeks ago Type 2 diabetes mellitus without complication, without long-term current use of insulin ([place])   [place], [place] L, RPH-CPP   1 month ago Need for vaccination against Streptococcus pneumoniae   [place], Annie Main L, RPH-CPP   1 month ago Type 2 diabetes mellitus with microalbuminuria, without long-term current use of insulin ([place])   [place], Neoma Laming B, MD   4 months ago Type 2 diabetes mellitus without complication, without long-term current use of insulin [facility])   [place], MD   10 months ago Need for shingles vaccine   [place], RPH-CPP      Future Appointments            Tomorrow Daisy Blossom, Jarome Matin, [place]   In 2 months Wynetta Emery,  Dalbert Batman, MD [place]

## 2020-12-19 ENCOUNTER — Ambulatory Visit: Payer: Medicaid Other | Attending: Internal Medicine | Admitting: Pharmacist

## 2020-12-19 ENCOUNTER — Encounter: Payer: Self-pay | Admitting: Pharmacist

## 2020-12-19 ENCOUNTER — Other Ambulatory Visit: Payer: Self-pay

## 2020-12-19 DIAGNOSIS — E119 Type 2 diabetes mellitus without complications: Secondary | ICD-10-CM

## 2020-12-19 LAB — GLUCOSE, POCT (MANUAL RESULT ENTRY): POC Glucose: 112 mg/dl — AB (ref 70–99)

## 2020-12-19 NOTE — Progress Notes (Signed)
    S:    PCP: Dr. Wynetta Emery  Patient presents for diabetes evaluation, education, and management Patient was referred and last seen by Primary Care Provider on 10/31/2020. I saw her on 5/0/3546 and started Trulicity. We instructed her to continue Lantus, however, she tells me today that she stopped this.   Family/Social History:  - FHx: no pertinent positives - Tobacco: never smoker  - Alcohol: denies use    Insurance coverage/medication affordability: Medicaid  Medication adherence: she stopped her Lantus but reports compliance with Trulicity and metformin.    Current diabetes medications include: metformin 1000 mg BID, Lantus 7 units daily (not taking), Trulicity 5.68 mg weekly  Patient denies hypoglycemic events.  Patient reported dietary habits:  - pt previously reports trying to "eat better" and limit portions, but more recently she admits to dietary indiscretion with junk foods   Patient-reported exercise habits: - pt reports walking when weather permits   Patient denies nocturia (nighttime urination).  Patient denies neuropathy (nerve pain). Patient denies visual changes.   O:  POCT: 112   Lab Results  Component Value Date   HGBA1C 7.2 (A) 11/21/2020   There were no vitals filed for this visit.  Lipid Panel     Component Value Date/Time   CHOL 197 08/21/2020 0830   TRIG 210 (H) 08/21/2020 0830   HDL 34 (L) 08/21/2020 0830   CHOLHDL 5.8 (H) 08/21/2020 0830   CHOLHDL 9.2 (H) 09/28/2015 1420   VLDL 61 (H) 09/28/2015 1420   LDLCALC 126 (H) 08/21/2020 0830   Home CBGs:  - Reports range low 100s - 120s. Denies any readings > 150.  Clinical Atherosclerotic Cardiovascular Disease (ASCVD): No  The 10-year ASCVD risk score Mikey Bussing DC Jr., et al., 2013) is: 10.2%   Values used to calculate the score:     Age: 60 years     Sex: Female     Is Non-Hispanic African American: No     Diabetic: Yes     Tobacco smoker: No     Systolic Blood Pressure: 127 mmHg     Is BP  treated: Yes     HDL Cholesterol: 34 mg/dL     Total Cholesterol: 197 mg/dL    A/P: Diabetes longstanding currently close to goal. Patient is able to verbalize appropriate hypoglycemia management plan. Medication adherence appears good.  - Discontinue Lantus  - Continue metformin 1000 mg BID - Continue Trulicity 5.17 mg weekly. We can increase this to goal dose at follow-up in 1 month.  -Extensively discussed pathophysiology of diabetes, recommended lifestyle interventions, dietary effects on blood sugar control -Counseled on s/sx of and management of hypoglycemia - Next A1c anticipated: 02/2021  Written patient instructions provided.  Total time in face to face counseling 20 minutes.   Follow up Pharmacist Clinic Visit in 1 month.    Benard Halsted, PharmD, Para [place], [place] [phone]

## 2020-12-21 ENCOUNTER — Encounter: Payer: Self-pay | Admitting: Internal Medicine

## 2020-12-22 ENCOUNTER — Encounter: Payer: Self-pay | Admitting: Internal Medicine

## 2020-12-25 ENCOUNTER — Other Ambulatory Visit: Payer: Self-pay | Admitting: Internal Medicine

## 2020-12-25 DIAGNOSIS — E039 Hypothyroidism, unspecified: Secondary | ICD-10-CM

## 2020-12-25 NOTE — Telephone Encounter (Signed)
Requested Prescriptions  Pending Prescriptions Disp Refills  . levothyroxine (SYNTHROID) 50 MCG tablet [Pharmacy Med Name: levothyroxine 50 mcg tablet] 90 tablet 3    Sig: Take 1 tablet (50 mcg total) by mouth daily.     Endocrinology:  Hypothyroid Agents Failed - 12/25/2020 11:28 AM      Failed - TSH needs to be rechecked within 3 months after an abnormal result. Refill until TSH is due.      Passed - TSH in normal range and within 360 days    TSH  Date Value Ref Range Status  10/31/2020 2.630 0.450 - 4.500 uIU/mL Final         Passed - Valid encounter within last 12 months    Recent Outpatient Visits          6 days ago Type 2 diabetes mellitus without complication, without long-term current use of insulin [facility])   [place], Annie Main L, RPH-CPP   1 month ago Type 2 diabetes mellitus without complication, without long-term current use of insulin [facility])   [place], Annie Main L, RPH-CPP   1 month ago Need for vaccination against Streptococcus pneumoniae   [place], Annie Main L, RPH-CPP   1 month ago Type 2 diabetes mellitus with microalbuminuria, without long-term current use of insulin [facility])   [place] Karle Plumber B, MD   4 months ago Type 2 diabetes mellitus without complication, without long-term current use of insulin [facility])   [place], Deborah B, MD      Future Appointments            In 3 weeks Tresa Endo, [place]   In 2 months Wynetta Emery, Dalbert Batman, MD [place]

## 2020-12-27 DIAGNOSIS — F330 Major depressive disorder, recurrent, mild: Secondary | ICD-10-CM | POA: Diagnosis not present

## 2020-12-28 ENCOUNTER — Encounter: Payer: Self-pay | Admitting: Internal Medicine

## 2020-12-29 MED ORDER — LANTUS SOLOSTAR 100 UNIT/ML ~~LOC~~ SOPN: 5.0000 [IU] | PEN_INJECTOR | Freq: Every day | SUBCUTANEOUS | 99 refills | Status: DC

## 2021-01-04 DIAGNOSIS — F330 Major depressive disorder, recurrent, mild: Secondary | ICD-10-CM | POA: Diagnosis not present

## 2021-01-08 ENCOUNTER — Other Ambulatory Visit: Payer: Self-pay | Admitting: Pharmacist

## 2021-01-08 ENCOUNTER — Encounter: Payer: Self-pay | Admitting: Internal Medicine

## 2021-01-08 MED ORDER — TRULICITY 1.5 MG/0.5ML ~~LOC~~ SOAJ: 1.5000 mg | SUBCUTANEOUS | 2 refills | Status: DC

## 2021-01-12 ENCOUNTER — Encounter: Payer: Self-pay | Admitting: Internal Medicine

## 2021-01-19 ENCOUNTER — Other Ambulatory Visit: Payer: Self-pay | Admitting: Internal Medicine

## 2021-01-19 ENCOUNTER — Other Ambulatory Visit: Payer: Self-pay

## 2021-01-19 ENCOUNTER — Ambulatory Visit: Payer: Medicaid Other | Attending: Internal Medicine | Admitting: Pharmacist

## 2021-01-19 DIAGNOSIS — E119 Type 2 diabetes mellitus without complications: Secondary | ICD-10-CM | POA: Diagnosis not present

## 2021-01-19 DIAGNOSIS — I10 Essential (primary) hypertension: Secondary | ICD-10-CM

## 2021-01-19 LAB — GLUCOSE, POCT (MANUAL RESULT ENTRY): POC Glucose: 119 mg/dl — AB (ref 70–99)

## 2021-01-19 NOTE — Progress Notes (Signed)
    S:    PCP: Dr. Wynetta Emery  Patient presents for diabetes evaluation, education, and management Patient was referred and last seen by Primary Care Provider on 10/31/2020. I saw her on 12/19/2020 and stopped Lantus. She was continued on metformin and Trulicity. Of note, she contacted the clinis 12/28/20 and endorsed nausea with metformin. Dr. Wynetta Emery stopped this and restarted 5u of Lantus daily. Trulicity was continued.   Today, pt tells me she never started the Lantus but did stop metformin. She says nausea has resolved. Reports that she now takes Trulicity only for her diabetes. Denies NV, abdominal pain.   Family/Social History:  - FHx: no pertinent positives - Tobacco: never smoker  - Alcohol: denies use    Insurance coverage/medication affordability: Medicaid  Medication adherence reported with Trulicity only.  Current diabetes medications include: Lantus 5 units daily (not taking), Trulicity 1.5 mg weekly  Patient denies hypoglycemic events.  Patient reported dietary habits:  - pt previously reports trying to "eat better" and limit portions, but more recently she admits to dietary indiscretion with junk foods   Patient-reported exercise habits: - pt reports walking when weather permits   Patient denies nocturia (nighttime urination).  Patient denies neuropathy (nerve pain). Patient denies visual changes.   O:  POCT: 119  Lab Results  Component Value Date   HGBA1C 7.2 (A) 11/21/2020   There were no vitals filed for this visit.  Lipid Panel     Component Value Date/Time   CHOL 197 08/21/2020 0830   TRIG 210 (H) 08/21/2020 0830   HDL 34 (L) 08/21/2020 0830   CHOLHDL 5.8 (H) 08/21/2020 0830   CHOLHDL 9.2 (H) 09/28/2015 1420   VLDL 61 (H) 09/28/2015 1420   LDLCALC 126 (H) 08/21/2020 0830   Home CBGs:  - Reports range low 100s - 120s. Denies any readings > 150.  Clinical Atherosclerotic Cardiovascular Disease (ASCVD): No  The 10-year ASCVD risk score Mikey Bussing DC Jr.,  et al., 2013) is: 10.2%   Values used to calculate the score:     Age: 60 years     Sex: Female     Is Non-Hispanic African American: No     Diabetic: Yes     Tobacco smoker: No     Systolic Blood Pressure: 161 mmHg     Is BP treated: Yes     HDL Cholesterol: 34 mg/dL     Total Cholesterol: 197 mg/dL    A/P: Diabetes longstanding currently close to goal. Patient is able to verbalize appropriate hypoglycemia management plan. Medication adherence appears good. CBGs at home are good on Trulicity only. Reports that her nausea has resolved.  - Discontinue Lantus.  - Continue Trulicity 1.5 mg weekly.  -Extensively discussed pathophysiology of diabetes, recommended lifestyle interventions, dietary effects on blood sugar control -Counseled on s/sx of and management of hypoglycemia - Next A1c anticipated: 02/2021  Written patient instructions provided.  Total time in face to face counseling 20 minutes.   Follow up PCP Clinic Visit in 1 month.    Benard Halsted, PharmD, [place], [place] [phone]

## 2021-01-23 ENCOUNTER — Encounter: Payer: Self-pay | Admitting: Internal Medicine

## 2021-02-01 ENCOUNTER — Encounter: Payer: Self-pay | Admitting: Internal Medicine

## 2021-02-06 ENCOUNTER — Encounter: Payer: Self-pay | Admitting: Internal Medicine

## 2021-02-06 DIAGNOSIS — F330 Major depressive disorder, recurrent, mild: Secondary | ICD-10-CM | POA: Diagnosis not present

## 2021-02-06 DIAGNOSIS — Z23 Encounter for immunization: Secondary | ICD-10-CM | POA: Diagnosis not present

## 2021-02-07 ENCOUNTER — Encounter: Payer: Self-pay | Admitting: Internal Medicine

## 2021-02-09 ENCOUNTER — Encounter: Payer: Self-pay | Admitting: Internal Medicine

## 2021-02-12 ENCOUNTER — Encounter: Payer: Self-pay | Admitting: Internal Medicine

## 2021-02-19 ENCOUNTER — Encounter: Payer: Self-pay | Admitting: Internal Medicine

## 2021-02-19 ENCOUNTER — Telehealth: Payer: Self-pay | Admitting: Internal Medicine

## 2021-02-19 ENCOUNTER — Other Ambulatory Visit: Payer: Self-pay | Admitting: Internal Medicine

## 2021-02-19 DIAGNOSIS — I10 Essential (primary) hypertension: Secondary | ICD-10-CM

## 2021-02-19 NOTE — Telephone Encounter (Signed)
Pt's son - Willeen Novak is calling to request a referral to be sent to [address] to be transported to her medical appts. Please advise when it has been sent. CB- [phone] or [phone]

## 2021-02-20 NOTE — Telephone Encounter (Signed)
Responded to pt MyChart message.

## 2021-02-21 ENCOUNTER — Other Ambulatory Visit: Payer: Self-pay | Admitting: Internal Medicine

## 2021-02-21 DIAGNOSIS — I10 Essential (primary) hypertension: Secondary | ICD-10-CM

## 2021-02-28 DIAGNOSIS — F330 Major depressive disorder, recurrent, mild: Secondary | ICD-10-CM | POA: Diagnosis not present

## 2021-03-01 ENCOUNTER — Ambulatory Visit: Payer: Medicaid Other | Admitting: Internal Medicine

## 2021-03-08 ENCOUNTER — Other Ambulatory Visit: Payer: Self-pay | Admitting: Internal Medicine

## 2021-03-17 ENCOUNTER — Other Ambulatory Visit: Payer: Self-pay | Admitting: Internal Medicine

## 2021-03-17 DIAGNOSIS — I10 Essential (primary) hypertension: Secondary | ICD-10-CM

## 2021-03-28 ENCOUNTER — Encounter: Payer: Self-pay | Admitting: Internal Medicine

## 2021-03-29 ENCOUNTER — Ambulatory Visit: Payer: Medicaid Other | Attending: Internal Medicine | Admitting: Internal Medicine

## 2021-03-29 ENCOUNTER — Encounter: Payer: Self-pay | Admitting: Internal Medicine

## 2021-03-29 ENCOUNTER — Other Ambulatory Visit: Payer: Self-pay

## 2021-03-29 DIAGNOSIS — F411 Generalized anxiety disorder: Secondary | ICD-10-CM | POA: Diagnosis not present

## 2021-03-29 DIAGNOSIS — K219 Gastro-esophageal reflux disease without esophagitis: Secondary | ICD-10-CM

## 2021-03-29 DIAGNOSIS — F331 Major depressive disorder, recurrent, moderate: Secondary | ICD-10-CM | POA: Diagnosis not present

## 2021-03-29 DIAGNOSIS — F330 Major depressive disorder, recurrent, mild: Secondary | ICD-10-CM | POA: Diagnosis not present

## 2021-03-29 MED ORDER — OMEPRAZOLE 20 MG PO CPDR: 20.0000 mg | DELAYED_RELEASE_CAPSULE | Freq: Every day | ORAL | 3 refills | Status: DC

## 2021-03-29 NOTE — Progress Notes (Signed)
Virtual Visit via Telephone Note  I connected with Jaidence Geisler Defibaugh on 03/29/2021 at 8:25 a.m by telephone and verified that I am speaking with the correct person using two identifiers  Location: Patient:  home Provider:office   Participants: Myself Patient CMA: Ms. Maryclare Bean interpreter:   I discussed the limitations, risks, security and privacy concerns of performing an evaluation and management service by telephone and the availability of in person appointments. I also discussed with the patient that there may be a patient responsible charge related to this service. The patient expressed understanding and agreed to proceed.   History of Present Illness: HTN, HL, depression, hypothyroid, DM, tension HA, chronic LBP (minimal degen disc ds on MRI). This is an UC  Pt c/o heartburn intermittently x 1 mth. Associated with eating spicy foods.  Starts in stomach  and goes to throat. Gets bitter back brush in throat.  Occurs mainly at nights a few hrs after she eats supper. Took some of her son's Prilosec last night which helped. Not on any NSAIDs  Outpatient Encounter Medications as of 03/29/2021  Medication Sig   Accu-Chek FastClix Lancets MISC Use as instructed to check blood sugar daily. E11.9   acetaminophen (TYLENOL) 325 MG tablet Take 2 tablets (650 mg total) by mouth every 6 (six) hours as needed for moderate pain.   amitriptyline (ELAVIL) 25 MG tablet Take 1 tablet (25 mg total) by mouth at bedtime.   amLODipine (NORVASC) 10 MG tablet Take 1 tablet (10 mg total) by mouth daily.   atorvastatin (LIPITOR) 40 MG tablet Take 1.5 tablets (60 mg total) by mouth daily.   Blood Glucose Monitoring Suppl (ACCU-CHEK GUIDE ME) w/Device KIT 1 kit by Does not apply route daily. Use as instructed to check blood sugar daily. E11.9   Blood Pressure Monitor DEVI Use as directed to check home blood pressure 2-3 times a week   Cholecalciferol (VITAMIN D) 2000 units tablet Take 2,000 Units by mouth  daily.   escitalopram (LEXAPRO) 10 MG tablet Take 10 mg by mouth at bedtime.   glucose blood (ACCU-CHEK GUIDE) test strip USE AS DIRECTED TO check blood sugar daily   glucose blood test strip Use as instructed   levothyroxine (SYNTHROID) 50 MCG tablet Take 1 tablet (50 mcg total) by mouth daily.   lisinopril (ZESTRIL) 20 MG tablet Take 1 tablet (20 mg total) by mouth daily.   meloxicam (MOBIC) 15 MG tablet Take 1 tablet (15 mg total) by mouth daily. Take with food   methocarbamol (ROBAXIN) 500 MG tablet Take 1 tablet (500 mg total) by mouth every 8 (eight) hours as needed for muscle spasms.   Omega-3 Fatty Acids (FISH OIL) 1000 MG CAPS Take 1,000 mg by mouth 2 (two) times daily.   propranolol (INDERAL) 80 MG tablet Take 1 tablet (80 mg total) by mouth 2 (two) times daily.   TRULICITY 1.5 FT/7.3UK SOPN Inject 1.5 mg into the skin once a week.   No facility-administered encounter medications on file as of 03/29/2021.      Observations/Objective: No direct observation done as this was a telephone encounter.  Assessment and Plan: 1. Gastroesophageal reflux disease without esophagitis GERD precautions discussed.  Advised to avoid spicy foods, excessive coffee intake, foods that have tomato paste or tomato sauce in it, juices.  Advised to eat last meal at least 3 hours before laying down and to sleep with her head a little elevated at nights.  Avoid NSAIDs.  We will give omeprazole.  Sent  printed information to her MyChart account about acid reflux. - omeprazole (PRILOSEC) 20 MG capsule; Take 1 capsule (20 mg total) by mouth daily.  Dispense: 30 capsule; Refill: 3   Follow Up Instructions: As needed.   I discussed the assessment and treatment plan with the patient. The patient was provided an opportunity to ask questions and all were answered. The patient agreed with the plan and demonstrated an understanding of the instructions.   The patient was advised to call back or seek an in-person  evaluation if the symptoms worsen or if the condition fails to improve as anticipated.  I  Spent 7 minutes on this telephone encounter  Karle Plumber, MD

## 2021-04-02 ENCOUNTER — Encounter: Payer: Self-pay | Admitting: Internal Medicine

## 2021-04-02 ENCOUNTER — Other Ambulatory Visit: Payer: Self-pay | Admitting: Internal Medicine

## 2021-04-02 ENCOUNTER — Telehealth: Payer: Self-pay | Admitting: Internal Medicine

## 2021-04-02 DIAGNOSIS — E119 Type 2 diabetes mellitus without complications: Secondary | ICD-10-CM

## 2021-04-02 MED ORDER — ACCU-CHEK FASTCLIX LANCETS MISC: 6 refills | Status: DC

## 2021-04-02 NOTE — Telephone Encounter (Signed)
Medication Refill - Medication: Pharmacy stated that pt is testing up to 4 times a day so they need Rx to be enough for that for Accu-Chek FastClix Lancets MISC  / Pt also needs refill for Accu-Chek test strips    Has the patient contacted their pharmacy? Yes.   (Agent: If no, request that the patient contact the pharmacy for the refill.) (Agent: If yes, when and what did the pharmacy advise?)  Preferred Pharmacy (with phone number or street name): [facility] [address]  [place], [place] [address]  Phone:  [phone]  Fax:  [phone]   Agent: Please be advised that RX refills may take up to 3 business days. We ask that you follow-up with your pharmacy.

## 2021-04-02 NOTE — Telephone Encounter (Signed)
Copied from [place] [id number]. Topic: General - Other >> Apr 02, 2021  3:10 PM Leward Quan A wrote: Reason for CRM: Dominique with [facility] called in to inform Dr Wynetta Emery that patient is testing her blood sugar 4 times a day and need Rx for test strips and lancets that reflect such or she will keep running out of test strips and lancets. Please advise

## 2021-04-03 ENCOUNTER — Other Ambulatory Visit: Payer: Self-pay | Admitting: Internal Medicine

## 2021-04-03 DIAGNOSIS — I10 Essential (primary) hypertension: Secondary | ICD-10-CM

## 2021-04-03 MED ORDER — ACCU-CHEK FASTCLIX LANCETS MISC: 6 refills | Status: DC

## 2021-04-03 MED ORDER — ACCU-CHEK GUIDE VI STRP: ORAL_STRIP | 6 refills | Status: DC

## 2021-04-03 NOTE — Telephone Encounter (Signed)
Rx sent 

## 2021-04-19 ENCOUNTER — Other Ambulatory Visit: Payer: Self-pay | Admitting: Internal Medicine

## 2021-04-19 DIAGNOSIS — I10 Essential (primary) hypertension: Secondary | ICD-10-CM

## 2021-04-19 NOTE — Telephone Encounter (Signed)
   Notes to clinic: Patient scheduled for appt tomorrow    Requested Prescriptions  Pending Prescriptions Disp Refills   lisinopril (ZESTRIL) 20 MG tablet [Pharmacy Med Name: lisinopril 20 mg tablet] 90 tablet 0    Sig: Take 1 tablet (20 mg total) by mouth daily.      Cardiovascular:  ACE Inhibitors Failed - 04/19/2021  9:26 AM      Failed - Cr in normal range and within 180 days    Creat  Date Value Ref Range Status  09/28/2015 0.77 0.50 - 1.05 mg/dL Final   Creatinine, Ser  Date Value Ref Range Status  08/21/2020 0.79 0.57 - 1.00 mg/dL Final          Failed - K in normal range and within 180 days    Potassium  Date Value Ref Range Status  08/21/2020 4.5 3.5 - 5.2 mmol/L Final          Passed - Patient is not pregnant      Passed - Last BP in normal range    BP Readings from Last 1 Encounters:  10/31/20 126/83          Passed - Valid encounter within last 6 months    Recent Outpatient Visits           3 weeks ago Gastroesophageal reflux disease without esophagitis   [place], Neoma Laming B, MD   3 months ago Type 2 diabetes mellitus without complication, without long-term current use of insulin ([place])   [place], Annie Main L, RPH-CPP   4 months ago Type 2 diabetes mellitus without complication, without long-term current use of insulin [facility])   [place], Annie Main L, RPH-CPP   4 months ago Type 2 diabetes mellitus without complication, without long-term current use of insulin [facility])   [place], Jarome Matin, RPH-CPP   5 months ago Need for vaccination against Streptococcus pneumoniae   [place], RPH-CPP       Future Appointments             Tomorrow Ladell Pier, MD [place]

## 2021-04-20 ENCOUNTER — Encounter: Payer: Self-pay | Admitting: Internal Medicine

## 2021-04-20 ENCOUNTER — Ambulatory Visit: Payer: Medicaid Other | Attending: Internal Medicine | Admitting: Internal Medicine

## 2021-04-20 ENCOUNTER — Other Ambulatory Visit: Payer: Self-pay

## 2021-04-20 ENCOUNTER — Other Ambulatory Visit (HOSPITAL_COMMUNITY)
Admission: RE | Admit: 2021-04-20 | Discharge: 2021-04-20 | Disposition: A | Discharge status: Home / Self Care | Payer: Medicaid Other | Source: Ambulatory Visit | Attending: Internal Medicine | Admitting: Internal Medicine

## 2021-04-20 VITALS — BP 111/75 | HR 61 | Resp 16 | Wt 178.6 lb

## 2021-04-20 DIAGNOSIS — Z1231 Encounter for screening mammogram for malignant neoplasm of breast: Secondary | ICD-10-CM

## 2021-04-20 DIAGNOSIS — E119 Type 2 diabetes mellitus without complications: Secondary | ICD-10-CM

## 2021-04-20 DIAGNOSIS — E1159 Type 2 diabetes mellitus with other circulatory complications: Secondary | ICD-10-CM | POA: Diagnosis not present

## 2021-04-20 DIAGNOSIS — E785 Hyperlipidemia, unspecified: Secondary | ICD-10-CM

## 2021-04-20 DIAGNOSIS — Z79890 Hormone replacement therapy (postmenopausal): Secondary | ICD-10-CM | POA: Diagnosis not present

## 2021-04-20 DIAGNOSIS — E663 Overweight: Secondary | ICD-10-CM | POA: Insufficient documentation

## 2021-04-20 DIAGNOSIS — E039 Hypothyroidism, unspecified: Secondary | ICD-10-CM | POA: Diagnosis not present

## 2021-04-20 DIAGNOSIS — K0510 Chronic gingivitis, plaque induced: Secondary | ICD-10-CM | POA: Diagnosis not present

## 2021-04-20 DIAGNOSIS — Z6830 Body mass index (BMI) 30.0-30.9, adult: Secondary | ICD-10-CM | POA: Insufficient documentation

## 2021-04-20 DIAGNOSIS — I152 Hypertension secondary to endocrine disorders: Secondary | ICD-10-CM | POA: Diagnosis not present

## 2021-04-20 DIAGNOSIS — Z124 Encounter for screening for malignant neoplasm of cervix: Secondary | ICD-10-CM | POA: Insufficient documentation

## 2021-04-20 DIAGNOSIS — K029 Dental caries, unspecified: Secondary | ICD-10-CM

## 2021-04-20 DIAGNOSIS — M792 Neuralgia and neuritis, unspecified: Secondary | ICD-10-CM | POA: Diagnosis not present

## 2021-04-20 DIAGNOSIS — E1169 Type 2 diabetes mellitus with other specified complication: Secondary | ICD-10-CM | POA: Diagnosis not present

## 2021-04-20 DIAGNOSIS — I10 Essential (primary) hypertension: Secondary | ICD-10-CM | POA: Insufficient documentation

## 2021-04-20 DIAGNOSIS — E782 Mixed hyperlipidemia: Secondary | ICD-10-CM | POA: Insufficient documentation

## 2021-04-20 LAB — GLUCOSE, POCT (MANUAL RESULT ENTRY): POC Glucose: 111 mg/dl — AB (ref 70–99)

## 2021-04-20 LAB — POCT GLYCOSYLATED HEMOGLOBIN (HGB A1C): HbA1c, POC (controlled diabetic range): 5.5 % (ref 0.0–7.0)

## 2021-04-20 MED ORDER — GABAPENTIN 300 MG PO CAPS: 300.0000 mg | ORAL_CAPSULE | Freq: Every day | ORAL | 0 refills | Status: DC

## 2021-04-20 NOTE — Progress Notes (Signed)
Patient ID: [name], female    DOB: November 04, 1960  MRN: [id number]  CC: Diabetes   Subjective: [name] is a 60 y.o. female who presents for chronic ds management Her concerns today include:  HTN, HL, depression, hypothyroid, DM, obesity, tension HA, chronic LBP (minimal degen disc ds on MRI).    DIABETES TYPE 2 Last A1C:   Results for orders placed or performed in visit on 04/20/21  POCT glucose (manual entry)  Result Value Ref Range   POC Glucose 111 (A) 70 - 99 mg/dl  POCT glycosylated hemoglobin (Hb A1C)  Result Value Ref Range   Hemoglobin A1C     HbA1c POC (<> result, manual entry)     HbA1c, POC (prediabetic range)     HbA1c, POC (controlled diabetic range) 5.5 0.0 - 7.0 %    Med Adherence:  [x]  Yes on Trulicity   []  No Medication side effects:  []  Yes    [x]  No Home Monitoring?  [x]  Yes 2-3x/day before meals Home glucose results range: 104-143.   Diet Adherence: [x]  Yes  -not eating as much since being on trulicity.  Now reading labels Exercise: [x]  Yes  - takes walks around her building when weather permits.  She is down 12 pounds since January of this year. Hypoglycemic episodes?: []  Yes    [x]  No Numbness of the feet? []  Yes    []  No Retinopathy hx? []  Yes    [x]  No Last eye exam: will be due 05/2021 Comments:   HTN:  compliant with lisinopril and amlodipine and salt restriction No CP/SOB/LE edema Taking and tolerating Lipitor  Hypothyroidism: Compliant with taking levothyroxine.  Last TSH was checked in January of this year was normal.  She denies any palpitations or diarrhea.  C/o tooth ache that comes and goes.  One of the tooth in her LT lower jaw  sends a shooting pain when she touches it with tongue.  Sensitive when she eats also.  Can not chew well on that side.  Has appt with dentist on 05/14/2021.  Tylenol does not seem to help  Due for PAP.  Pt is G1P1 No abn paps in past. No vaginal dischg or itching.  Not sexually active at this time.   Wants STD screen. She is postmenopausal Fhx of breast cancer in aunt.  Personal hx of few cyst removed from both breast.  She checks her breast regulaly HM:  MMG ordered 10/2020.  Reports she was not called and also has transportation issues. Has to take cab to doctors visit.  Has tried calling Medicaid transportation but they do not cal back.  Patient Active Problem List   Diagnosis Date Noted   Influenza vaccine needed 10/31/2020   Need for diphtheria-tetanus-pertussis (Tdap) vaccine 10/31/2020   Hyperlipidemia associated with type 2 diabetes mellitus ([place]) 10/31/2020   Overweight (BMI 25.0-29.9) 10/31/2020   Type 2 diabetes mellitus without complication, without long-term current use of insulin ([place]) 07/31/2020   OSA (obstructive sleep apnea) 02/02/2020   Spondylosis without myelopathy or radiculopathy, lumbar region 09/17/2017   Essential hypertension 09/05/2017   Chronic bilateral low back pain with left-sided sciatica 09/05/2017   HTN (hypertension) 05/05/2017   Hypothyroidism 02/24/2017   Mixed hyperlipidemia 02/24/2017   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   Sleep related headaches 01/23/2017   Snoring 01/23/2017   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   Chronic headaches 10/30/2016   Obesity (BMI 30.0-34.9) 05/24/2016   PMB (postmenopausal bleeding) 05/24/2016   Back  pain of lumbar region with sciatica 09/28/2015     Current Outpatient Medications on File Prior to Visit  Medication Sig Dispense Refill   Accu-Chek FastClix Lancets MISC Use as instructed to check blood sugar TID. E11.9 102 each 6   acetaminophen (TYLENOL) 325 MG tablet Take 2 tablets (650 mg total) by mouth every 6 (six) hours as needed for moderate pain. 30 tablet    amitriptyline (ELAVIL) 25 MG tablet Take 1 tablet (25 mg total) by mouth at bedtime. 30 tablet 0   amLODipine (NORVASC) 10 MG tablet Take 1 tablet (10 mg total) by mouth daily. 90 tablet 0   atorvastatin (LIPITOR) 40 MG tablet Take 1.5  tablets (60 mg total) by mouth daily. 135 tablet 3   Blood Glucose Monitoring Suppl (ACCU-CHEK GUIDE ME) w/Device KIT 1 kit by Does not apply route daily. Use as instructed to check blood sugar daily. E11.9 1 kit 0   Blood Pressure Monitor DEVI Use as directed to check home blood pressure 2-3 times a week 1 Device 0   Cholecalciferol (VITAMIN D) 2000 units tablet Take 2,000 Units by mouth daily.     escitalopram (LEXAPRO) 10 MG tablet Take 10 mg by mouth at bedtime.  2   glucose blood (ACCU-CHEK GUIDE) test strip USE TO check blood sugar TID 100 strip 6   levothyroxine (SYNTHROID) 50 MCG tablet Take 1 tablet (50 mcg total) by mouth daily. 90 tablet 2   lisinopril (ZESTRIL) 20 MG tablet Take 1 tablet (20 mg total) by mouth daily. 90 tablet 0   Omega-3 Fatty Acids (FISH OIL) 1000 MG CAPS Take 1,000 mg by mouth 2 (two) times daily.     omeprazole (PRILOSEC) 20 MG capsule Take 1 capsule (20 mg total) by mouth daily. 30 capsule 3   propranolol (INDERAL) 80 MG tablet Take 1 tablet (80 mg total) by mouth 2 (two) times daily. 845 tablet 0   TRULICITY 1.5 XM/4.6OE SOPN Inject 1.5 mg into the skin once a week. 2 mL 2   No current facility-administered medications on file prior to visit.    Allergies  Allergen Reactions   Iron Diarrhea   Metformin And Related Nausea And Vomiting   Penicillins Nausea And Vomiting    Has patient had a PCN reaction causing immediate rash, facial/tongue/throat swelling, SOB or lightheadedness with hypotension: No Has patient had a PCN reaction causing severe rash involving mucus membranes or skin necrosis: No Has patient had a PCN reaction that required hospitalization No Has patient had a PCN reaction occurring within the last 10 years: No If all of the above answers are "NO", then may proceed with Cephalosporin use.    Social History   Socioeconomic History   Marital status: Single    Spouse name: Not on file   Number of children: 1   Years of education: 12    Highest education level: Not on file  Occupational History   Occupation: Unemployed  Tobacco Use   Smoking status: Never   Smokeless tobacco: Never  Substance and Sexual Activity   Alcohol use: No    Alcohol/week: 0.0 standard drinks   Drug use: No   Sexual activity: Not Currently    Partners: Male  Other Topics Concern   Not on file  Social History Narrative   Lives with son, Shanon Brow.   Caffeine use: Daily       Left handed   Social Determinants of Health   Financial Resource Strain: Not on file  Food  Insecurity: Not on file  Transportation Needs: Not on file  Physical Activity: Not on file  Stress: Not on file  Social Connections: Not on file  Intimate Partner Violence: Not on file    Family History  Problem Relation Age of Onset   Headache Neg Hx    Colon cancer Neg Hx    Esophageal cancer Neg Hx    Rectal cancer Neg Hx    Stomach cancer Neg Hx    Breast cancer Neg Hx     Past Surgical History:  Procedure Laterality Date   BREAST CYST EXCISION Right 1980   BREAST CYST EXCISION Left 1985   NO PAST SURGERIES      ROS: Review of Systems Negative except as stated above  PHYSICAL EXAM: BP 111/75   Pulse 61   Resp 16   Wt 178 lb 9.6 oz (81 kg)   LMP 06/06/2015   SpO2 97%   BMI 27.56 kg/m   Wt Readings from Last 3 Encounters:  04/20/21 178 lb 9.6 oz (81 kg)  10/31/20 190 lb (86.2 kg)  01/31/20 203 lb 3.2 oz (92.2 kg)    Physical Exam  General appearance - alert, well appearing, middle-aged Caucasian female and in no distress Mental status - normal mood, behavior, speech, dress, motor activity, and thought processes Neck - supple, no significant adenopathy Mouth: Patient has swollen red gums with plaque buildup around the base of the teeth.  She has a large cavity in the third molar in the left lower jaw.  No signs of dental abscess. Chest - clear to auscultation, no wheezes, rales or rhonchi, symmetric air entry Heart - normal rate, regular rhythm,  normal S1, S2, no murmurs, rubs, clicks or gallops Breasts - breasts appear normal, no suspicious masses, no skin or nipple changes or axillary nodes Pelvic - normal external genitalia, vulva, vagina, cervix, uterus and adnexa Extremities - peripheral pulses normal, no pedal edema, no clubbing or cyanosis Diabetic Foot Exam - Simple   Simple Foot Form Visual Inspection See comments: Yes Sensation Testing Intact to touch and monofilament testing bilaterally: Yes Pulse Check Posterior Tibialis and Dorsalis pulse intact bilaterally: Yes Comments Patient has a lot of dead white skin over the dorsal surface of the toes and on the soles of both feet.      CMP Latest Ref Rng & Units 08/21/2020 01/31/2020 02/12/2018  Glucose 65 - 99 mg/dL 306(H) 186(H) 211(H)  BUN 6 - 24 mg/dL 10 12 14   Creatinine 0.57 - 1.00 mg/dL 0.79 0.86 0.94  Sodium 134 - 144 mmol/L 137 139 139  Potassium 3.5 - 5.2 mmol/L 4.5 4.7 4.1  Chloride 96 - 106 mmol/L 99 97 101  CO2 20 - 29 mmol/L 22 24 23   Calcium 8.7 - 10.2 mg/dL 10.2 10.4(H) 9.1  Total Protein 6.0 - 8.5 g/dL 8.7(H) 9.4(H) 8.6(H)  Total Bilirubin 0.0 - 1.2 mg/dL 0.5 0.5 <0.2  Alkaline Phos 44 - 121 IU/L 112 133(H) 101  AST 0 - 40 IU/L 36 44(H) 22  ALT 0 - 32 IU/L 47(H) 53(H) 29   Lipid Panel     Component Value Date/Time   CHOL 197 08/21/2020 0830   TRIG 210 (H) 08/21/2020 0830   HDL 34 (L) 08/21/2020 0830   CHOLHDL 5.8 (H) 08/21/2020 0830   CHOLHDL 9.2 (H) 09/28/2015 1420   VLDL 61 (H) 09/28/2015 1420   LDLCALC 126 (H) 08/21/2020 0830    CBC    Component Value Date/Time   WBC  9.0 10/31/2020 1426   WBC 5.7 05/06/2017 0144   RBC 5.18 10/31/2020 1426   RBC 4.37 05/06/2017 0144   HGB 16.1 (H) 10/31/2020 1426   HCT 47.5 (H) 10/31/2020 1426   PLT 326 10/31/2020 1426   MCV 92 10/31/2020 1426   MCH 31.1 10/31/2020 1426   MCH 30.7 05/06/2017 0144   MCHC 33.9 10/31/2020 1426   MCHC 33.5 05/06/2017 0144   RDW 13.1 10/31/2020 1426   LYMPHSABS 1.8  05/05/2017 1108   LYMPHSABS 2.4 02/17/2017 1141   MONOABS 0.7 05/05/2017 1108   EOSABS 0.1 05/05/2017 1108   EOSABS 0.1 02/17/2017 1141   BASOSABS 0.0 05/05/2017 1108   BASOSABS 0.1 02/17/2017 1141    ASSESSMENT AND PLAN: 1. Type 2 diabetes mellitus without complication, without long-term current use of insulin ([place]) At goal.  Commended her on good control of her diabetes and on weight loss.  Encouraged her to continue healthy eating habits and regular exercise.  Continue Trulicity. - POCT glucose (manual entry) - POCT glycosylated hemoglobin (Hb A1C)  2. Hypertension associated with diabetes ([place]) At goal.  Continue current medications and low-salt diet.  3. Hyperlipidemia associated with type 2 diabetes mellitus ([facility]) Continue atorvastatin.  4. Gingivitis 5. Dental cavity 6. Paroxysmal nerve pain Patient to keep dental appointment.  She is having some nerve pain from that decayed tooth in the left lower jaw.  I recommend putting her on low-dose of gabapentin to take at bedtime to see if it would help decrease the sensitivity until she gets in with the dentist to have that tooth extracted. - gabapentin (NEURONTIN) 300 MG capsule; Take 1 capsule (300 mg total) by mouth at bedtime.  Dispense: 30 capsule; Refill: 0  7. Hypothyroidism (acquired) Continue levothyroxine. - TSH  8. Pap smear for cervical cancer screening - Cytology - PAP - Cervicovaginal ancillary only  9. Encounter for screening mammogram for malignant neoplasm of breast Patient agreeable for [place] to reschedule the mammogram.  I told her that once she is called and has set up an appointment she should let me know so that I can have our caseworker work on trying to get her Medicaid transportation arranged to take her to the appt. - MM Digital Screening; Future    Patient was given the opportunity to ask questions.  Patient verbalized understanding of the plan and was able to repeat key elements of the plan.    Orders Placed This Encounter  Procedures   MM Digital Screening   TSH   POCT glucose (manual entry)   POCT glycosylated hemoglobin (Hb A1C)     Requested Prescriptions   Signed Prescriptions Disp Refills   gabapentin (NEURONTIN) 300 MG capsule 30 capsule 0    Sig: Take 1 capsule (300 mg total) by mouth at bedtime.    Return in about 4 months (around 08/21/2021).  Karle Plumber, MD, FACP

## 2021-04-21 ENCOUNTER — Encounter: Payer: Self-pay | Admitting: Internal Medicine

## 2021-04-21 LAB — TSH: TSH: 0.982 u[IU]/mL (ref 0.450–4.500)

## 2021-04-24 ENCOUNTER — Other Ambulatory Visit: Payer: Self-pay | Admitting: Internal Medicine

## 2021-04-24 DIAGNOSIS — I10 Essential (primary) hypertension: Secondary | ICD-10-CM

## 2021-04-24 LAB — CERVICOVAGINAL ANCILLARY ONLY
Bacterial Vaginitis (gardnerella): POSITIVE — AB
Candida Glabrata: NEGATIVE
Candida Vaginitis: NEGATIVE
Chlamydia: NEGATIVE
Comment: NEGATIVE
Comment: NEGATIVE
Comment: NEGATIVE
Comment: NEGATIVE
Comment: NEGATIVE
Comment: NORMAL
Neisseria Gonorrhea: NEGATIVE
Trichomonas: NEGATIVE

## 2021-04-24 MED ORDER — METRONIDAZOLE 500 MG PO TABS: 500.0000 mg | ORAL_TABLET | Freq: Two times a day (BID) | ORAL | 0 refills | Status: DC

## 2021-04-25 ENCOUNTER — Encounter: Payer: Self-pay | Admitting: Internal Medicine

## 2021-04-25 DIAGNOSIS — G8929 Other chronic pain: Secondary | ICD-10-CM

## 2021-04-27 DIAGNOSIS — F330 Major depressive disorder, recurrent, mild: Secondary | ICD-10-CM | POA: Diagnosis not present

## 2021-04-27 LAB — CYTOLOGY - PAP
Comment: NEGATIVE
Diagnosis: NEGATIVE
High risk HPV: NEGATIVE

## 2021-05-05 ENCOUNTER — Encounter (INDEPENDENT_AMBULATORY_CARE_PROVIDER_SITE_OTHER): Payer: Self-pay

## 2021-05-17 DIAGNOSIS — F330 Major depressive disorder, recurrent, mild: Secondary | ICD-10-CM | POA: Diagnosis not present

## 2021-05-23 ENCOUNTER — Other Ambulatory Visit: Payer: Self-pay | Admitting: Family Medicine

## 2021-05-23 ENCOUNTER — Other Ambulatory Visit: Payer: Self-pay | Admitting: Internal Medicine

## 2021-05-23 ENCOUNTER — Telehealth: Payer: Self-pay | Admitting: Internal Medicine

## 2021-05-23 DIAGNOSIS — Z09 Encounter for follow-up examination after completed treatment for conditions other than malignant neoplasm: Secondary | ICD-10-CM

## 2021-05-23 DIAGNOSIS — F330 Major depressive disorder, recurrent, mild: Secondary | ICD-10-CM | POA: Diagnosis not present

## 2021-05-23 NOTE — Telephone Encounter (Signed)
  Patient is now enrolled into the Transportation Program.   [name] DOB: 11-21-1960 MRN: [id number]   RIDER WAIVER AND RELEASE OF LIABILITY  For purposes of improving physical access to our facilities, [facility] is pleased to partner with third parties to provide [place] patients or other authorized individuals the option of convenient, on-demand ground transportation services (the [place]") through use of the technology service that enables users to request on-demand ground transportation from independent third-party providers.  By opting to use and accept these [employer], I, the undersigned, hereby agree on behalf of myself, and on behalf of any minor child using the Government social research officer for whom I am the parent or legal guardian, as follows:  Government social research officer provided to me are provided by independent third-party transportation providers who are not [employer] or employees and who are unaffiliated with [employer]. [facility] is neither a transportation carrier nor a common or public carrier. [facility] has no control over the quality or safety of the transportation that occurs as a result of the [employer]. [facility] cannot guarantee that any third-party transportation provider will complete any arranged transportation service. [facility] makes no representation, warranty, or guarantee regarding the reliability, timeliness, quality, safety, suitability, or availability of any of the Transport Services or that they will be error free. I fully understand that traveling by vehicle involves risks and dangers of serious bodily injury, including permanent disability, paralysis, and death. I agree, on behalf of myself and on behalf of any minor child using the Transport Services for whom I am the parent or legal guardian, that the entire risk arising out of my use of the [employer] remains solely with me, to the maximum extent permitted  under applicable law. The [employer] are provided "as is" and "as available." [facility] disclaims all representations and warranties, express, implied or statutory, not expressly set out in these terms, including the implied warranties of merchantability and fitness for a particular purpose. I hereby waive and release [facility], its agents, employees, officers, directors, representatives, insurers, attorneys, assigns, successors, subsidiaries, and affiliates from any and all past, present, or future claims, demands, liabilities, actions, causes of action, or suits of any kind directly or indirectly arising from acceptance and use of the [employer]. I further waive and release [facility] [place] and its affiliates from all present and future liability and responsibility for any injury or death to persons or damages to property caused by or related to the use of the [employer]. I have read this Waiver and Release of Liability, and I understand the terms used in it and their legal significance. This Waiver is freely and voluntarily given with the understanding that my right (as well as the right of any minor child for whom I am the parent or legal guardian using the [employer]) to legal recourse against [place] in connection with the [employer] is knowingly surrendered in return for use of these services.   I attest that I read the consent document to [name], gave Ms. Worton the opportunity to ask questions and answered the questions asked (if any). I affirm that [name] then provided consent for she's participation in this program.     [place]on Proud

## 2021-05-25 ENCOUNTER — Other Ambulatory Visit: Payer: Self-pay | Admitting: Internal Medicine

## 2021-06-13 ENCOUNTER — Other Ambulatory Visit: Payer: Self-pay

## 2021-06-13 ENCOUNTER — Ambulatory Visit
Admission: RE | Admit: 2021-06-13 | Discharge: 2021-06-13 | Disposition: A | Discharge status: Home / Self Care | Payer: Medicaid Other | Source: Ambulatory Visit | Attending: Internal Medicine | Admitting: Internal Medicine

## 2021-06-13 DIAGNOSIS — R922 Inconclusive mammogram: Secondary | ICD-10-CM | POA: Diagnosis not present

## 2021-06-13 DIAGNOSIS — Z09 Encounter for follow-up examination after completed treatment for conditions other than malignant neoplasm: Secondary | ICD-10-CM

## 2021-06-21 ENCOUNTER — Other Ambulatory Visit: Payer: Self-pay | Admitting: Internal Medicine

## 2021-06-21 DIAGNOSIS — K219 Gastro-esophageal reflux disease without esophagitis: Secondary | ICD-10-CM

## 2021-06-29 DIAGNOSIS — F330 Major depressive disorder, recurrent, mild: Secondary | ICD-10-CM | POA: Diagnosis not present

## 2021-07-05 ENCOUNTER — Other Ambulatory Visit: Payer: Self-pay | Admitting: Internal Medicine

## 2021-07-05 NOTE — Telephone Encounter (Signed)
Requested medication (s) are due for refill today:   Provider to review  Requested medication (s) are on the active medication list:   Yes  Future visit scheduled:   Yes in 1 mo. With Dr. Wynetta Emery   Last ordered: 04/24/2021 #14, 0 refills  Returned because no protocol assigned to antibiotics   Requested Prescriptions  Pending Prescriptions Disp Refills   metroNIDAZOLE (FLAGYL) 500 MG tablet [Pharmacy Med Name: metronidazole 500 mg tablet] 14 tablet 0    Sig: Take 1 tablet (500 mg total) by mouth 2 (two) times daily.     Off-Protocol Failed - 07/05/2021  1:37 PM      Failed - Medication not assigned to a protocol, review manually.      Passed - Valid encounter within last 12 months    Recent Outpatient Visits           2 months ago Type 2 diabetes mellitus without complication, without long-term current use of insulin ([place])   [place], Neoma Laming B, MD   3 months ago Gastroesophageal reflux disease without esophagitis   [place] Karle Plumber B, MD   5 months ago Type 2 diabetes mellitus without complication, without long-term current use of insulin [facility])   [place], Annie Main L, RPH-CPP   6 months ago Type 2 diabetes mellitus without complication, without long-term current use of insulin [facility])   [place], Annie Main L, RPH-CPP   7 months ago Type 2 diabetes mellitus without complication, without long-term current use of insulin [facility])   [place], RPH-CPP       Future Appointments             In 1 month Wynetta Emery, Dalbert Batman, MD [place]

## 2021-07-20 ENCOUNTER — Other Ambulatory Visit: Payer: Self-pay | Admitting: Internal Medicine

## 2021-07-20 DIAGNOSIS — I10 Essential (primary) hypertension: Secondary | ICD-10-CM

## 2021-08-07 DIAGNOSIS — F330 Major depressive disorder, recurrent, mild: Secondary | ICD-10-CM | POA: Diagnosis not present

## 2021-08-15 ENCOUNTER — Other Ambulatory Visit: Payer: Self-pay | Admitting: Internal Medicine

## 2021-08-15 NOTE — Telephone Encounter (Signed)
Requested Prescriptions  Pending Prescriptions Disp Refills  . TRULICITY 1.5 HM/0.9OB SOPN [Pharmacy Med Name: Trulicity 1.5 SJ/6.2 mL subcutaneous pen injector] 2 mL 2    Sig: Inject 1.5 mg into the skin once a week.     Endocrinology:  Diabetes - GLP-1 Receptor Agonists Passed - 08/15/2021 10:56 AM      Passed - HBA1C is between 0 and 7.9 and within 180 days    HbA1c, POC (prediabetic range)  Date Value Ref Range Status  08/24/2018 6.1 5.7 - 6.4 % Final   HbA1c, POC (controlled diabetic range)  Date Value Ref Range Status  04/20/2021 5.5 0.0 - 7.0 % Final         Passed - Valid encounter within last 6 months    Recent Outpatient Visits          3 months ago Type 2 diabetes mellitus without complication, without long-term current use of insulin ([place])   [place], Neoma Laming B, MD   4 months ago Gastroesophageal reflux disease without esophagitis   [place] Karle Plumber B, MD   6 months ago Type 2 diabetes mellitus without complication, without long-term current use of insulin [facility])   [place], Annie Main L, RPH-CPP   7 months ago Type 2 diabetes mellitus without complication, without long-term current use of insulin [facility])   [place], Annie Main L, RPH-CPP   8 months ago Type 2 diabetes mellitus without complication, without long-term current use of insulin [facility])   [place], RPH-CPP      Future Appointments            In 1 week Ladell Pier, MD [place]

## 2021-08-28 ENCOUNTER — Encounter: Payer: Self-pay | Admitting: Internal Medicine

## 2021-08-28 ENCOUNTER — Other Ambulatory Visit: Payer: Self-pay

## 2021-08-28 ENCOUNTER — Ambulatory Visit: Payer: Medicaid Other | Attending: Internal Medicine | Admitting: Internal Medicine

## 2021-08-28 VITALS — BP 118/80 | HR 99 | Resp 16 | Wt 187.2 lb

## 2021-08-28 DIAGNOSIS — I152 Hypertension secondary to endocrine disorders: Secondary | ICD-10-CM | POA: Diagnosis not present

## 2021-08-28 DIAGNOSIS — E119 Type 2 diabetes mellitus without complications: Secondary | ICD-10-CM

## 2021-08-28 DIAGNOSIS — E1169 Type 2 diabetes mellitus with other specified complication: Secondary | ICD-10-CM

## 2021-08-28 DIAGNOSIS — R269 Unspecified abnormalities of gait and mobility: Secondary | ICD-10-CM | POA: Diagnosis not present

## 2021-08-28 DIAGNOSIS — Z23 Encounter for immunization: Secondary | ICD-10-CM | POA: Diagnosis not present

## 2021-08-28 DIAGNOSIS — E1159 Type 2 diabetes mellitus with other circulatory complications: Secondary | ICD-10-CM | POA: Diagnosis not present

## 2021-08-28 DIAGNOSIS — E785 Hyperlipidemia, unspecified: Secondary | ICD-10-CM

## 2021-08-28 DIAGNOSIS — E039 Hypothyroidism, unspecified: Secondary | ICD-10-CM

## 2021-08-28 LAB — POCT GLYCOSYLATED HEMOGLOBIN (HGB A1C): HbA1c, POC (controlled diabetic range): 5.8 % (ref 0.0–7.0)

## 2021-08-28 NOTE — Progress Notes (Signed)
Patient ID: [name], female    DOB: [birth year]  MRN: [id number]  CC: Diabetes   Subjective: [name] is a 60 y.o. female who presents for chronic ds management Her concerns today include:  HTN, HL, depression, hypothyroid, DM, obesity, tension HA, chronic LBP (minimal degen disc ds on MRI).   DM/obesity:   Results for orders placed or performed in visit on 08/28/21  POCT glycosylated hemoglobin (Hb A1C)  Result Value Ref Range   Hemoglobin A1C     HbA1c POC (<> result, manual entry)     HbA1c, POC (prediabetic range)     HbA1c, POC (controlled diabetic range) 5.8 0.0 - 7.0 %   Metformin stopped on last visit.  Started on Trulicity instead.  Tolerating Trulicity Checking BS before BF or after dinner.  Range after dinner in the 120s, before BF in less than 110.  Gained 9 lbs since being on Trulicity in July. Reports she is eating less and staying away from sugar drinks.  -Goes for walks about 30 mins QOD.   -Due for eye exam. Wears glasses.  Plans to go to [place] on Gabapentin last visit for nerve pain from decayed tooth.  Since had tooth pulled, issue has resolved and she no longer takes the gabapentin.  HTN: compliant with Norvasc and Lisinopril and took already for today.  Limits salt Checks BP QOD.  Does not have log with her today.  Reports readings have been high at times  HL:  taking and tolerating Lipitor  Thyroid:  taking Levothyroxine consistently  Reports feeling like her balance is off when she goes up and down one step to her house.  She wears bifocals.   she feels her prescription strength is too strong.  She has had these glasses since last year.  She will be seeing the optometrist at [facility] in the near future to get her eye exam done.  No dizziness.  Denies any numbness in the feet.  No issues with her balance when she is out walking for exercise.  Reports having had a fall this past weekend.  She sat on a stool where one of the legs was not  balanced properly and she ended up falling backwards hitting her left side.  HM:  will take flu shot.  Patient Active Problem List   Diagnosis Date Noted   Influenza vaccine needed 10/31/2020   Need for diphtheria-tetanus-pertussis (Tdap) vaccine 10/31/2020   Hyperlipidemia associated with type 2 diabetes mellitus ([place]) 10/31/2020   Overweight (BMI 25.0-29.9) 10/31/2020   Type 2 diabetes mellitus without complication, without long-term current use of insulin ([place]) 07/31/2020   OSA (obstructive sleep apnea) 02/02/2020   Spondylosis without myelopathy or radiculopathy, lumbar region 09/17/2017   Essential hypertension 09/05/2017   Chronic bilateral low back pain with left-sided sciatica 09/05/2017   HTN (hypertension) 05/05/2017   Hypothyroidism 02/24/2017   Mixed hyperlipidemia 02/24/2017   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   Sleep related headaches 01/23/2017   Snoring 01/23/2017   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   Chronic headaches 10/30/2016   Obesity (BMI 30.0-34.9) 05/24/2016   PMB (postmenopausal bleeding) 05/24/2016   Back pain of lumbar region with sciatica 09/28/2015     Current Outpatient Medications on File Prior to Visit  Medication Sig Dispense Refill   Accu-Chek FastClix Lancets MISC Use as instructed to check blood sugar TID. E11.9 102 each 6   acetaminophen (TYLENOL) 325 MG tablet Take 2 tablets (650 mg total) by  mouth every 6 (six) hours as needed for moderate pain. 30 tablet    amitriptyline (ELAVIL) 25 MG tablet Take 1 tablet (25 mg total) by mouth at bedtime. 30 tablet 0   amLODipine (NORVASC) 10 MG tablet Take 1 tablet (10 mg total) by mouth daily. 90 tablet 0   atorvastatin (LIPITOR) 40 MG tablet Take 1.5 tablets (60 mg total) by mouth daily. 135 tablet 3   Blood Glucose Monitoring Suppl (ACCU-CHEK GUIDE ME) w/Device KIT 1 kit by Does not apply route daily. Use as instructed to check blood sugar daily. E11.9 1 kit 0   Blood Pressure Monitor DEVI  Use as directed to check home blood pressure 2-3 times a week 1 Device 0   Cholecalciferol (VITAMIN D) 2000 units tablet Take 2,000 Units by mouth daily.     escitalopram (LEXAPRO) 10 MG tablet Take 10 mg by mouth at bedtime.  2   glucose blood (ACCU-CHEK GUIDE) test strip USE TO check blood sugar TID 100 strip 6   levothyroxine (SYNTHROID) 50 MCG tablet Take 1 tablet (50 mcg total) by mouth daily. 90 tablet 2   lisinopril (ZESTRIL) 20 MG tablet Take 1 tablet (20 mg total) by mouth daily. 90 tablet 0   Omega-3 Fatty Acids (FISH OIL) 1000 MG CAPS Take 1,000 mg by mouth 2 (two) times daily.     omeprazole (PRILOSEC) 20 MG capsule Take 1 capsule (20 mg total) by mouth daily. 90 capsule 2   propranolol (INDERAL) 80 MG tablet Take 1 tablet (80 mg total) by mouth 2 (two) times daily. 952 tablet 0   TRULICITY 1.5 WU/1.3KG SOPN Inject 1.5 mg into the skin once a week. 2 mL 2   No current facility-administered medications on file prior to visit.    Allergies  Allergen Reactions   Iron Diarrhea   Metformin And Related Nausea And Vomiting   Penicillins Nausea And Vomiting    Has patient had a PCN reaction causing immediate rash, facial/tongue/throat swelling, SOB or lightheadedness with hypotension: No Has patient had a PCN reaction causing severe rash involving mucus membranes or skin necrosis: No Has patient had a PCN reaction that required hospitalization No Has patient had a PCN reaction occurring within the last 10 years: No If all of the above answers are "NO", then may proceed with Cephalosporin use.    Social History   Socioeconomic History   Marital status: Single    Spouse name: Not on file   Number of children: 1   Years of education: 12   Highest education level: Not on file  Occupational History   Occupation: Unemployed  Tobacco Use   Smoking status: Never   Smokeless tobacco: Never  Substance and Sexual Activity   Alcohol use: No    Alcohol/week: 0.0 standard drinks    Drug use: No   Sexual activity: Not Currently    Partners: Male  Other Topics Concern   Not on file  Social History Narrative   Lives with son, Shanon Brow.   Caffeine use: Daily       Left handed   Social Determinants of Health   Financial Resource Strain: Not on file  Food Insecurity: Not on file  Transportation Needs: Not on file  Physical Activity: Not on file  Stress: Not on file  Social Connections: Not on file  Intimate Partner Violence: Not on file    Family History  Problem Relation Age of Onset   Headache Neg Hx    Colon cancer Neg  Hx    Esophageal cancer Neg Hx    Rectal cancer Neg Hx    Stomach cancer Neg Hx    Breast cancer Neg Hx     Past Surgical History:  Procedure Laterality Date   BREAST CYST EXCISION Right 1980   BREAST CYST EXCISION Left 1985   NO PAST SURGERIES      ROS: Review of Systems Negative except as stated above  PHYSICAL EXAM: BP 118/80   Pulse 99   Resp 16   Wt 187 lb 3.2 oz (84.9 kg)   LMP 06/06/2015   SpO2 98%   BMI 28.89 kg/m   Wt Readings from Last 3 Encounters:  08/28/21 187 lb 3.2 oz (84.9 kg)  04/20/21 178 lb 9.6 oz (81 kg)  10/31/20 190 lb (86.2 kg)    Physical Exam  General appearance - alert, well appearing, and in no distress Mental status - normal mood, behavior, speech, dress, motor activity, and thought processes Mouth - mucous membranes moist, pharynx normal without lesions Neck - supple, no significant adenopathy Chest - clear to auscultation, no wheezes, rales or rhonchi, symmetric air entry Heart - normal rate, regular rhythm, normal S1, S2, no murmurs, rubs, clicks or gallops Neurological - cranial nerves II through XII intact, motor and sensory grossly normal bilaterally.  Romberg is negative.  She has low foot to floor clearance on observing her gait.  She also noted to not live foot high enough when getting on the step to the exam table. Extremities - peripheral pulses normal, no pedal edema, no clubbing  or cyanosis Diabetic Foot Exam - Simple   Simple Foot Form Visual Inspection See comments: Yes Sensation Testing Intact to touch and monofilament testing bilaterally: Yes Pulse Check Posterior Tibialis and Dorsalis pulse intact bilaterally: Yes Comments Feet dry and cracking.  Toenails slightly overgrown.      CMP Latest Ref Rng & Units 08/21/2020 01/31/2020 02/12/2018  Glucose 65 - 99 mg/dL 306(H) 186(H) 211(H)  BUN 6 - 24 mg/dL 10 12 14   Creatinine 0.57 - 1.00 mg/dL 0.79 0.86 0.94  Sodium 134 - 144 mmol/L 137 139 139  Potassium 3.5 - 5.2 mmol/L 4.5 4.7 4.1  Chloride 96 - 106 mmol/L 99 97 101  CO2 20 - 29 mmol/L 22 24 23   Calcium 8.7 - 10.2 mg/dL 10.2 10.4(H) 9.1  Total Protein 6.0 - 8.5 g/dL 8.7(H) 9.4(H) 8.6(H)  Total Bilirubin 0.0 - 1.2 mg/dL 0.5 0.5 <0.2  Alkaline Phos 44 - 121 IU/L 112 133(H) 101  AST 0 - 40 IU/L 36 44(H) 22  ALT 0 - 32 IU/L 47(H) 53(H) 29   Lipid Panel     Component Value Date/Time   CHOL 197 08/21/2020 0830   TRIG 210 (H) 08/21/2020 0830   HDL 34 (L) 08/21/2020 0830   CHOLHDL 5.8 (H) 08/21/2020 0830   CHOLHDL 9.2 (H) 09/28/2015 1420   VLDL 61 (H) 09/28/2015 1420   LDLCALC 126 (H) 08/21/2020 0830    CBC    Component Value Date/Time   WBC 9.0 10/31/2020 1426   WBC 5.7 05/06/2017 0144   RBC 5.18 10/31/2020 1426   RBC 4.37 05/06/2017 0144   HGB 16.1 (H) 10/31/2020 1426   HCT 47.5 (H) 10/31/2020 1426   PLT 326 10/31/2020 1426   MCV 92 10/31/2020 1426   MCH 31.1 10/31/2020 1426   MCH 30.7 05/06/2017 0144   MCHC 33.9 10/31/2020 1426   MCHC 33.5 05/06/2017 0144   RDW 13.1 10/31/2020 1426  LYMPHSABS 1.8 05/05/2017 1108   LYMPHSABS 2.4 02/17/2017 1141   MONOABS 0.7 05/05/2017 1108   EOSABS 0.1 05/05/2017 1108   EOSABS 0.1 02/17/2017 1141   BASOSABS 0.0 05/05/2017 1108   BASOSABS 0.1 02/17/2017 1141    ASSESSMENT AND PLAN: 1. Type 2 diabetes mellitus without complication, without long-term current use of insulin (HCC) At goal. Continue  Trulicity.  Encouraged her to continue healthy eating habits and regular exercise. - POCT glycosylated hemoglobin (Hb A1C) - CBC - Comprehensive metabolic panel - Lipid panel  2. Hypertension associated with diabetes ([place]) At goal.  Continue current medications and low-salt diet.  3. Hyperlipidemia associated with type 2 diabetes mellitus (HCC) Continue atorvastatin.  4. Hypothyroidism (acquired) Continue levothyroxine. - TSH  5. Gait disturbance Advised that she has the optometrist checked her current glasses to make sure that the prescription is appropriate.  She wears bifocals and may be missing or misjudging the depth when going up and down her step - Ambulatory referral to Physical Therapy  6. Need for influenza vaccination Given today.   Patient was given the opportunity to ask questions.  Patient verbalized understanding of the plan and was able to repeat key elements of the plan.   Orders Placed This Encounter  Procedures   TSH   CBC   Comprehensive metabolic panel   Lipid panel   Ambulatory referral to Physical Therapy   POCT glycosylated hemoglobin (Hb A1C)     Requested Prescriptions    No prescriptions requested or ordered in this encounter    Return in about 4 months (around 12/26/2021).  Karle Plumber, MD, FACP

## 2021-08-28 NOTE — Patient Instructions (Signed)
When you see the optometrist at [facility], make sure that he/she checks the strength of your bifocals. I have referred you for some physical therapy as we discussed today.

## 2021-08-29 LAB — COMPREHENSIVE METABOLIC PANEL
ALT: 33 IU/L — ABNORMAL HIGH (ref 0–32)
AST: 18 IU/L (ref 0–40)
Albumin/Globulin Ratio: 1.2 (ref 1.2–2.2)
Albumin: 4.9 g/dL (ref 3.8–4.9)
Alkaline Phosphatase: 105 IU/L (ref 44–121)
BUN/Creatinine Ratio: 16 (ref 12–28)
BUN: 17 mg/dL (ref 8–27)
Bilirubin Total: 0.4 mg/dL (ref 0.0–1.2)
CO2: 26 mmol/L (ref 20–29)
Calcium: 10 mg/dL (ref 8.7–10.3)
Chloride: 100 mmol/L (ref 96–106)
Creatinine, Ser: 1.08 mg/dL — ABNORMAL HIGH (ref 0.57–1.00)
Globulin, Total: 4 g/dL (ref 1.5–4.5)
Glucose: 97 mg/dL (ref 70–99)
Potassium: 4.7 mmol/L (ref 3.5–5.2)
Sodium: 137 mmol/L (ref 134–144)
Total Protein: 8.9 g/dL — ABNORMAL HIGH (ref 6.0–8.5)
eGFR: 59 mL/min/{1.73_m2} — ABNORMAL LOW (ref >59)

## 2021-08-29 LAB — LIPID PANEL
Chol/HDL Ratio: 5.6 ratio — ABNORMAL HIGH (ref 0.0–4.4)
Cholesterol, Total: 206 mg/dL — ABNORMAL HIGH (ref 100–199)
HDL: 37 mg/dL — ABNORMAL LOW (ref >39)
LDL Chol Calc (NIH): 130 mg/dL — ABNORMAL HIGH (ref 0–99)
Triglycerides: 217 mg/dL — ABNORMAL HIGH (ref 0–149)
VLDL Cholesterol Cal: 39 mg/dL (ref 5–40)

## 2021-08-29 LAB — CBC
Hematocrit: 44.8 % (ref 34.0–46.6)
Hemoglobin: 15.7 g/dL (ref 11.1–15.9)
MCH: 31.9 pg (ref 26.6–33.0)
MCHC: 35 g/dL (ref 31.5–35.7)
MCV: 91 fL (ref 79–97)
Platelets: 302 10*3/uL (ref 150–450)
RBC: 4.92 x10E6/uL (ref 3.77–5.28)
RDW: 12.3 % (ref 11.7–15.4)
WBC: 8.2 10*3/uL (ref 3.4–10.8)

## 2021-08-29 LAB — TSH: TSH: 3.03 u[IU]/mL (ref 0.450–4.500)

## 2021-09-03 ENCOUNTER — Other Ambulatory Visit: Payer: Self-pay | Admitting: Internal Medicine

## 2021-09-03 DIAGNOSIS — E039 Hypothyroidism, unspecified: Secondary | ICD-10-CM

## 2021-09-03 NOTE — Telephone Encounter (Signed)
Requested Prescriptions  Pending Prescriptions Disp Refills  . levothyroxine (SYNTHROID) 50 MCG tablet [Pharmacy Med Name: levothyroxine 50 mcg tablet] 90 tablet 3    Sig: Take 1 tablet (50 mcg total) by mouth daily.     Endocrinology:  Hypothyroid Agents Failed - 09/03/2021 11:49 AM      Failed - TSH needs to be rechecked within 3 months after an abnormal result. Refill until TSH is due.      Passed - TSH in normal range and within 360 days    TSH  Date Value Ref Range Status  08/28/2021 3.030 0.450 - 4.500 uIU/mL Final         Passed - Valid encounter within last 12 months    Recent Outpatient Visits          6 days ago Type 2 diabetes mellitus without complication, without long-term current use of insulin ([place])   [place], Neoma Laming B, MD   4 months ago Type 2 diabetes mellitus without complication, without long-term current use of insulin ([place])   [place] Karle Plumber B, MD   5 months ago Gastroesophageal reflux disease without esophagitis   [place] Karle Plumber B, MD   7 months ago Type 2 diabetes mellitus without complication, without long-term current use of insulin [facility])   [place], Annie Main L, RPH-CPP   8 months ago Type 2 diabetes mellitus without complication, without long-term current use of insulin [facility])   [place], RPH-CPP      Future Appointments            In 3 months Wynetta Emery, Dalbert Batman, MD [place]

## 2021-09-06 DIAGNOSIS — F330 Major depressive disorder, recurrent, mild: Secondary | ICD-10-CM | POA: Diagnosis not present

## 2021-09-06 DIAGNOSIS — F411 Generalized anxiety disorder: Secondary | ICD-10-CM | POA: Diagnosis not present

## 2021-09-28 ENCOUNTER — Encounter: Payer: Self-pay | Admitting: Internal Medicine

## 2021-09-28 ENCOUNTER — Other Ambulatory Visit: Payer: Self-pay

## 2021-09-28 ENCOUNTER — Other Ambulatory Visit: Payer: Self-pay | Admitting: Internal Medicine

## 2021-09-28 DIAGNOSIS — I10 Essential (primary) hypertension: Secondary | ICD-10-CM

## 2021-10-09 ENCOUNTER — Other Ambulatory Visit: Payer: Self-pay | Admitting: Internal Medicine

## 2021-10-09 DIAGNOSIS — I10 Essential (primary) hypertension: Secondary | ICD-10-CM

## 2021-10-09 DIAGNOSIS — E785 Hyperlipidemia, unspecified: Secondary | ICD-10-CM

## 2021-10-10 NOTE — Telephone Encounter (Signed)
Requested Prescriptions  Pending Prescriptions Disp Refills   atorvastatin (LIPITOR) 40 MG tablet [Pharmacy Med Name: atorvastatin 40 mg tablet] 135 tablet 2    Sig: Take 1.5 tablets (60 mg total) by mouth daily.     Cardiovascular:  Antilipid - Statins Failed - 10/09/2021  4:20 PM      Failed - Total Cholesterol in normal range and within 360 days    Cholesterol, Total  Date Value Ref Range Status  08/28/2021 206 (H) 100 - 199 mg/dL Final         Failed - LDL in normal range and within 360 days    LDL Chol Calc (NIH)  Date Value Ref Range Status  08/28/2021 130 (H) 0 - 99 mg/dL Final         Failed - HDL in normal range and within 360 days    HDL  Date Value Ref Range Status  08/28/2021 37 (L) >39 mg/dL Final         Failed - Triglycerides in normal range and within 360 days    Triglycerides  Date Value Ref Range Status  08/28/2021 217 (H) 0 - 149 mg/dL Final         Passed - Patient is not pregnant      Passed - Valid encounter within last 12 months    Recent Outpatient Visits          1 month ago Type 2 diabetes mellitus without complication, without long-term current use of insulin ([place])   [place], Neoma Laming B, MD   5 months ago Type 2 diabetes mellitus without complication, without long-term current use of insulin ([place])   [place], Neoma Laming B, MD   6 months ago Gastroesophageal reflux disease without esophagitis   [place], Neoma Laming B, MD   8 months ago Type 2 diabetes mellitus without complication, without long-term current use of insulin [facility])   [place], Annie Main L, RPH-CPP   9 months ago Type 2 diabetes mellitus without complication, without long-term current use of insulin [facility])   [place], RPH-CPP      Future Appointments            In 2  months Ladell Pier, MD [place]            amLODipine (NORVASC) 10 MG tablet [Pharmacy Med Name: amlodipine 10 mg tablet] 90 tablet 1    Sig: Take 1 tablet (10 mg total) by mouth daily.     Cardiovascular:  Calcium Channel Blockers Passed - 10/09/2021  4:20 PM      Passed - Last BP in normal range    BP Readings from Last 1 Encounters:  08/28/21 118/80         Passed - Valid encounter within last 6 months    Recent Outpatient Visits          1 month ago Type 2 diabetes mellitus without complication, without long-term current use of insulin ([place])   [place], Neoma Laming B, MD   5 months ago Type 2 diabetes mellitus without complication, without long-term current use of insulin [facility])   [place] Karle Plumber B, MD   6 months ago Gastroesophageal reflux disease without esophagitis   [place] [facility] Karle Plumber B, MD   8 months ago Type 2 diabetes mellitus without complication, without long-term current use of insulin [facility])   [place], Annie Main L, RPH-CPP   9 months ago Type 2 diabetes mellitus without complication, without long-term current use of insulin [facility])   [place], RPH-CPP      Future Appointments            In 2 months Ladell Pier, MD [place]            lisinopril (ZESTRIL) 20 MG tablet [Pharmacy Med Name: lisinopril 20 mg tablet] 90 tablet 3    Sig: Take 1 tablet (20 mg total) by mouth daily.     Cardiovascular:  ACE Inhibitors Failed - 10/09/2021  4:20 PM      Failed - Cr in normal range and within 180 days    Creat  Date Value Ref Range Status  09/28/2015 0.77 0.50 - 1.05 mg/dL Final   Creatinine, Ser  Date Value Ref Range Status  08/28/2021 1.08 (H) 0.57 - 1.00 mg/dL Final          Passed - K in normal range and within 180 days    Potassium  Date Value Ref Range Status  08/28/2021 4.7 3.5 - 5.2 mmol/L Final         Passed - Patient is not pregnant      Passed - Last BP in normal range    BP Readings from Last 1 Encounters:  08/28/21 118/80         Passed - Valid encounter within last 6 months    Recent Outpatient Visits          1 month ago Type 2 diabetes mellitus without complication, without long-term current use of insulin ([place])   [place], Neoma Laming B, MD   5 months ago Type 2 diabetes mellitus without complication, without long-term current use of insulin ([place])   [place] Karle Plumber B, MD   6 months ago Gastroesophageal reflux disease without esophagitis   [place] Karle Plumber B, MD   8 months ago Type 2 diabetes mellitus without complication, without long-term current use of insulin [facility])   [place], Annie Main L, RPH-CPP   9 months ago Type 2 diabetes mellitus without complication, without long-term current use of insulin [facility])   [place], RPH-CPP      Future Appointments            In 2 months Wynetta Emery, Dalbert Batman, MD [place]

## 2021-10-10 NOTE — Telephone Encounter (Signed)
[place] and pharmacy tech Bee reports patient has picked up 90 day supply. Request for refill for next refill and will be out of refills .

## 2021-10-10 NOTE — Telephone Encounter (Signed)
Pharmacy received refill 09/28/21.

## 2021-10-10 NOTE — Telephone Encounter (Signed)
Rx refilled 09/28/2021 #90 with 0 refills.

## 2021-10-24 ENCOUNTER — Other Ambulatory Visit: Payer: Self-pay | Admitting: Internal Medicine

## 2021-10-24 DIAGNOSIS — E119 Type 2 diabetes mellitus without complications: Secondary | ICD-10-CM

## 2021-10-25 ENCOUNTER — Encounter: Payer: Self-pay | Admitting: Internal Medicine

## 2021-10-25 NOTE — Telephone Encounter (Signed)
Requested Prescriptions  Pending Prescriptions Disp Refills   glucose blood (ACCU-CHEK GUIDE) test strip [Pharmacy Med Name: Accu-Chek Guide test strips] 100 strip 6    Sig: USE TO check blood sugar three times daily     Endocrinology: Diabetes - Testing Supplies Passed - 10/24/2021  3:13 PM      Passed - Valid encounter within last 12 months    Recent Outpatient Visits          1 month ago Type 2 diabetes mellitus without complication, without long-term current use of insulin ([place])   [place], Neoma Laming B, MD   6 months ago Type 2 diabetes mellitus without complication, without long-term current use of insulin ([place])   [place] Karle Plumber B, MD   7 months ago Gastroesophageal reflux disease without esophagitis   [place] Karle Plumber B, MD   9 months ago Type 2 diabetes mellitus without complication, without long-term current use of insulin [facility])   [place], Annie Main L, RPH-CPP   10 months ago Type 2 diabetes mellitus without complication, without long-term current use of insulin [facility])   [place], RPH-CPP      Future Appointments            In 2 months Wynetta Emery, Dalbert Batman, MD [place]

## 2021-10-26 ENCOUNTER — Telehealth: Payer: Self-pay | Admitting: Pharmacist

## 2021-10-26 NOTE — Telephone Encounter (Signed)
[name],   This patient had a change in insurance and will need PA approved for Trulicity. Can we start this for this patient?

## 2021-10-31 ENCOUNTER — Other Ambulatory Visit: Payer: Self-pay

## 2021-10-31 ENCOUNTER — Encounter: Payer: Self-pay | Admitting: Internal Medicine

## 2021-10-31 NOTE — Telephone Encounter (Signed)
PA for Trulicity has been approved until 10/31/22.  [place] was notified and I was informed that patient has already picked up her medication.

## 2021-11-01 NOTE — Telephone Encounter (Signed)
Hey Dr. Wynetta Emery,   Just forwarding this thread to you. [name] got approval for her Trulicity and the patient has picked this up.

## 2021-11-12 ENCOUNTER — Other Ambulatory Visit: Payer: Self-pay | Admitting: Internal Medicine

## 2021-11-12 NOTE — Telephone Encounter (Signed)
Requested Prescriptions  Pending Prescriptions Disp Refills   TRULICITY 1.5 MB/8.4YK SOPN [Pharmacy Med Name: Trulicity 1.5 ZL/9.3 mL subcutaneous pen injector] 2 mL 2    Sig: INJECT 1.5mg  into THE SKIN Once weekly     Endocrinology:  Diabetes - GLP-1 Receptor Agonists Passed - 11/12/2021  9:21 AM      Passed - HBA1C is between 0 and 7.9 and within 180 days    HbA1c, POC (prediabetic range)  Date Value Ref Range Status  08/24/2018 6.1 5.7 - 6.4 % Final   HbA1c, POC (controlled diabetic range)  Date Value Ref Range Status  08/28/2021 5.8 0.0 - 7.0 % Final         Passed - Valid encounter within last 6 months    Recent Outpatient Visits          2 months ago Type 2 diabetes mellitus without complication, without long-term current use of insulin ([place])   [place], Neoma Laming B, MD   6 months ago Type 2 diabetes mellitus without complication, without long-term current use of insulin ([place])   [place] Karle Plumber B, MD   7 months ago Gastroesophageal reflux disease without esophagitis   [place] Karle Plumber B, MD   9 months ago Type 2 diabetes mellitus without complication, without long-term current use of insulin [facility])   [place], Annie Main L, RPH-CPP   10 months ago Type 2 diabetes mellitus without complication, without long-term current use of insulin [facility])   [place], RPH-CPP      Future Appointments            In 1 month Wynetta Emery, Dalbert Batman, MD [place]

## 2021-11-13 ENCOUNTER — Other Ambulatory Visit: Payer: Self-pay | Admitting: Internal Medicine

## 2021-11-13 DIAGNOSIS — E119 Type 2 diabetes mellitus without complications: Secondary | ICD-10-CM

## 2021-11-13 NOTE — Telephone Encounter (Signed)
Requested Prescriptions  Pending Prescriptions Disp Refills   Accu-Chek FastClix Lancets MISC [Pharmacy Med Name: Accu-Chek Fastclix Lancet [place]] 102 each 6    Sig: Use as instructed to check blood sugar three times daily     Endocrinology: Diabetes - Testing Supplies Passed - 11/13/2021 11:05 AM      Passed - Valid encounter within last 12 months    Recent Outpatient Visits          2 months ago Type 2 diabetes mellitus without complication, without long-term current use of insulin ([place])   [place], Neoma Laming B, MD   6 months ago Type 2 diabetes mellitus without complication, without long-term current use of insulin ([place])   [place] Karle Plumber B, MD   7 months ago Gastroesophageal reflux disease without esophagitis   [place] Karle Plumber B, MD   9 months ago Type 2 diabetes mellitus without complication, without long-term current use of insulin [facility])   [place], Annie Main L, RPH-CPP   10 months ago Type 2 diabetes mellitus without complication, without long-term current use of insulin [facility])   [place], RPH-CPP      Future Appointments            In 1 month Wynetta Emery, Dalbert Batman, MD [place]

## 2021-12-08 ENCOUNTER — Other Ambulatory Visit: Payer: Self-pay | Admitting: Internal Medicine

## 2021-12-08 DIAGNOSIS — I10 Essential (primary) hypertension: Secondary | ICD-10-CM

## 2021-12-08 DIAGNOSIS — M792 Neuralgia and neuritis, unspecified: Secondary | ICD-10-CM

## 2021-12-10 ENCOUNTER — Encounter: Payer: Self-pay | Admitting: Internal Medicine

## 2021-12-10 NOTE — Telephone Encounter (Signed)
Requested Prescriptions  Pending Prescriptions Disp Refills   propranolol (INDERAL) 80 MG tablet [Pharmacy Med Name: propranolol 80 mg tablet] 180 tablet 0    Sig: Take 1 tablet (80 mg total) by mouth 2 (two) times daily.     Cardiovascular:  Beta Blockers Passed - 12/08/2021 10:00 AM      Passed - Last BP in normal range    BP Readings from Last 1 Encounters:  08/28/21 118/80         Passed - Last Heart Rate in normal range    Pulse Readings from Last 1 Encounters:  08/28/21 99         Passed - Valid encounter within last 6 months    Recent Outpatient Visits          3 months ago Type 2 diabetes mellitus without complication, without long-term current use of insulin ([place])   [place], Neoma Laming B, MD   7 months ago Type 2 diabetes mellitus without complication, without long-term current use of insulin ([place])   [place] Karle Plumber B, MD   8 months ago Gastroesophageal reflux disease without esophagitis   [place] Karle Plumber B, MD   10 months ago Type 2 diabetes mellitus without complication, without long-term current use of insulin [facility])   [place], Annie Main L, RPH-CPP   11 months ago Type 2 diabetes mellitus without complication, without long-term current use of insulin [facility])   [place], RPH-CPP      Future Appointments            In 2 weeks Ladell Pier, MD [place]            lisinopril (ZESTRIL) 20 MG tablet [Pharmacy Med Name: lisinopril 20 mg tablet] 90 tablet 0    Sig: Take 1 tablet (20 mg total) by mouth daily.     Cardiovascular:  ACE Inhibitors Failed - 12/08/2021 10:00 AM      Failed - Cr in normal range and within 180 days    Creat  Date Value Ref Range Status  09/28/2015 0.77 0.50 - 1.05 mg/dL Final    Creatinine, Ser  Date Value Ref Range Status  08/28/2021 1.08 (H) 0.57 - 1.00 mg/dL Final         Passed - K in normal range and within 180 days    Potassium  Date Value Ref Range Status  08/28/2021 4.7 3.5 - 5.2 mmol/L Final         Passed - Patient is not pregnant      Passed - Last BP in normal range    BP Readings from Last 1 Encounters:  08/28/21 118/80         Passed - Valid encounter within last 6 months    Recent Outpatient Visits          3 months ago Type 2 diabetes mellitus without complication, without long-term current use of insulin ([place])   [place], Neoma Laming B, MD   7 months ago Type 2 diabetes mellitus without complication, without long-term current use of insulin ([place])   [place] Karle Plumber B, MD   8 months ago Gastroesophageal reflux disease without esophagitis   [place] Ladell Pier, MD  10 months ago Type 2 diabetes mellitus without complication, without long-term current use of insulin [facility])   [place], Annie Main L, RPH-CPP   11 months ago Type 2 diabetes mellitus without complication, without long-term current use of insulin [facility]   [place], RPH-CPP      Future Appointments            In 2 weeks Ladell Pier, MD [place]           Refused Prescriptions Disp Refills   gabapentin (NEURONTIN) 300 MG capsule [Pharmacy Med Name: gabapentin 300 mg capsule] 30 capsule 0    Sig: Take 1 capsule (300 mg total) by mouth at bedtime.     Neurology: Anticonvulsants - gabapentin Failed - 12/08/2021 10:00 AM      Failed - Cr in normal range and within 360 days    Creat  Date Value Ref Range Status  09/28/2015 0.77 0.50 - 1.05 mg/dL Final   Creatinine, Ser  Date Value Ref Range Status  08/28/2021  1.08 (H) 0.57 - 1.00 mg/dL Final         Passed - Completed PHQ-2 or PHQ-9 in the last 360 days      Passed - Valid encounter within last 12 months    Recent Outpatient Visits          3 months ago Type 2 diabetes mellitus without complication, without long-term current use of insulin ([place])   [place], Neoma Laming B, MD   7 months ago Type 2 diabetes mellitus without complication, without long-term current use of insulin ([place])   [place] Karle Plumber B, MD   8 months ago Gastroesophageal reflux disease without esophagitis   [place] Karle Plumber B, MD   10 months ago Type 2 diabetes mellitus without complication, without long-term current use of insulin [facility])   [place], Annie Main L, RPH-CPP   11 months ago Type 2 diabetes mellitus without complication, without long-term current use of insulin [facility])   [place], RPH-CPP      Future Appointments            In 2 weeks Ladell Pier, MD [place]            metroNIDAZOLE (FLAGYL) 500 MG tablet [Pharmacy Med Name: metronidazole 500 mg tablet] 14 tablet 0    Sig: Take 1 tablet (500 mg total) by mouth 2 (two) times daily.     Off-Protocol Failed - 12/08/2021 10:00 AM      Failed - Medication not assigned to a protocol, review manually.      Passed - Valid encounter within last 12 months    Recent Outpatient Visits          3 months ago Type 2 diabetes mellitus without complication, without long-term current use of insulin ([place])   [place], Neoma Laming B, MD   7 months ago Type 2 diabetes mellitus without complication, without long-term current use of insulin [facility])   [place] Ladell Pier, MD   8 months ago  Gastroesophageal reflux disease without esophagitis   [facility] Ladell Pier, MD  10 months ago Type 2 diabetes mellitus without complication, without long-term current use of insulin [facility])   [place], Jarome Matin, RPH-CPP   11 months ago Type 2 diabetes mellitus without complication, without long-term current use of insulin [facility])   [place], RPH-CPP      Future Appointments            In 2 weeks Ladell Pier, MD [place]

## 2021-12-21 ENCOUNTER — Encounter: Payer: Self-pay | Admitting: Physician Assistant

## 2021-12-21 ENCOUNTER — Ambulatory Visit (INDEPENDENT_AMBULATORY_CARE_PROVIDER_SITE_OTHER): Payer: Medicaid Other

## 2021-12-21 ENCOUNTER — Other Ambulatory Visit: Payer: Self-pay

## 2021-12-21 ENCOUNTER — Ambulatory Visit (INDEPENDENT_AMBULATORY_CARE_PROVIDER_SITE_OTHER): Payer: Medicaid Other | Admitting: Physician Assistant

## 2021-12-21 VITALS — Ht 67.0 in | Wt 187.0 lb

## 2021-12-21 DIAGNOSIS — M545 Low back pain, unspecified: Secondary | ICD-10-CM | POA: Diagnosis not present

## 2021-12-21 DIAGNOSIS — G8929 Other chronic pain: Secondary | ICD-10-CM | POA: Diagnosis not present

## 2021-12-21 NOTE — Progress Notes (Signed)
? ?Office Visit Note ?  ?Patient: [name]           ?Date of Birth: 1961/08/14           ?MRN: [id number] ?Visit Date: 12/21/2021 ?             ?Requested by: Ladell Pier, MD ?[address] ?[place],  [place] [address] ?PCP: Ladell Pier, MD ? ?Chief Complaint  ?Patient presents with  ? Lower Back - Pain  ? ? ? ? ?HPI: ?Patient is a pleasant 61 year old woman who presents today with a long history of lower back pain.  She denies any numbness tingling loss of strength or concerns with bowel or bladder issues.  She has seen Dr. Ernestina Patches quite a few times in the past a few years ago we tried multiple different types of injections as well as nerve ablations.  She said that did help but did not last very long.  She has had no new injury though she says her balance is slightly worse.  She denies any paresthesias she denies any recent changes in activities.  She cannot take anti-inflammatories and has been diagnosed with diabetes since she last saw Dr. Ernestina Patches.  She says the pain she has is in her lower back on the left side more than the right and very similar to what she is gone through in the past ? ?Assessment & Plan: ?Visit Diagnoses:  ?1. Chronic left-sided low back pain, unspecified whether sciatica present   ? ? ?Plan: Lower back pain.  Her x-rays are reassuring I do not see any acute changes or any significant joint space narrowing.  X-rays are very similar to those taken a few years ago.  I would like her to try a course of physical therapy and we will rerefer her to Dr. Ernestina Patches for reevaluation.  If she fails physical therapy may require a new MRI but she has not had any recent conservative treatment ? ?Follow-Up Instructions: No follow-ups on file.  ?Patient appears well normal respiratory effort appropriate to questioning ?Ortho Exam ? ?Patient is alert, oriented, no adenopathy, well-dressed, normal affect, normal respiratory effort. ?Examination of the lower back no palpable deformity no  redness no erythema she has no pain related to palpation today though she points to where it usually is she has 5 out of strength with dorsiflexion plantarflexion of her ankles resisted extension and flexion of her knees and resisted flexion of her hips.  Sensation is intact she is able to wiggle her toes without difficulty ? ?Imaging: ?No results found. ?No images are attached to the encounter. ? ?Labs: ?Lab Results  ?Component Value Date  ? HGBA1C 5.8 08/28/2021  ? HGBA1C 5.5 04/20/2021  ? HGBA1C 7.2 (A) 11/21/2020  ? REPTSTATUS 05/13/2017 FINAL 05/05/2017  ? CULT (A) 05/05/2017  ?  PROPIONIBACTERIUM ACNES ?Standardized susceptibility testing for this organism is not available. ?  ? LABORGA ESCHERICHIA COLI (A) 05/05/2017  ? ? ? ?Lab Results  ?Component Value Date  ? ALBUMIN 4.9 08/28/2021  ? ALBUMIN 4.9 08/21/2020  ? ALBUMIN 4.9 01/31/2020  ? ? ?No results found for: MG ?No results found for: VD25OH ? ?No results found for: PREALBUMIN ?CBC EXTENDED Latest Ref Rng & Units 08/28/2021 10/31/2020 01/31/2020  ?WBC 3.4 - 10.8 x10E3/uL 8.2 9.0 9.2  ?RBC 3.77 - 5.28 x10E6/uL 4.92 5.18 5.18  ?HGB 11.1 - 15.9 g/dL 15.7 16.1(H) 16.4(H)  ?HCT 34.0 - 46.6 % 44.8 47.5(H) 48.2(H)  ?PLT 150 - 450  x10E3/uL 302 326 337  ?NEUTROABS 1.7 - 7.7 K/uL - - -  ?LYMPHSABS 0.7 - 4.0 K/uL - - -  ? ? ? ?Body mass index is 29.29 kg/m?. ? ?Orders:  ?Orders Placed This Encounter  ?Procedures  ? XR Lumbar Spine 2-3 Views  ? Ambulatory referral to Physical Therapy  ? Ambulatory referral to Physical Medicine Rehab  ? ?No orders of the defined types were placed in this encounter. ? ? ? Procedures: ?No procedures performed ? ?Clinical Data: ?No additional findings. ? ?ROS: ? ?All other systems negative, except as noted in the HPI. ?Review of Systems ? ?Objective: ?Vital Signs: Ht 5\' 7"  (1.702 m)   Wt 187 lb (84.8 kg)   LMP 06/06/2015   BMI 29.29 kg/m?  ? ?Specialty Comments:  ?No specialty comments available. ? ?PMFS History: ?Patient Active Problem  List  ? Diagnosis Date Noted  ? Influenza vaccine needed 10/31/2020  ? Need for diphtheria-tetanus-pertussis (Tdap) vaccine 10/31/2020  ? Hyperlipidemia associated with type 2 diabetes mellitus ([place]) 10/31/2020  ? Overweight (BMI 25.0-29.9) 10/31/2020  ? Type 2 diabetes mellitus without complication, without long-term current use of insulin ([place]) 07/31/2020  ? OSA (obstructive sleep apnea) 02/02/2020  ? Spondylosis without myelopathy or radiculopathy, lumbar region 09/17/2017  ? Essential hypertension 09/05/2017  ? Chronic bilateral low back pain with left-sided sciatica 09/05/2017  ? HTN (hypertension) 05/05/2017  ? Hypothyroidism 02/24/2017  ? Mixed hyperlipidemia 02/24/2017  ? 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  ? Sleep related headaches 01/23/2017  ? Snoring 01/23/2017  ? 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  ? Chronic headaches 10/30/2016  ? Obesity (BMI 30.0-34.9) 05/24/2016  ? PMB (postmenopausal bleeding) 05/24/2016  ? Back pain of lumbar region with sciatica 09/28/2015  ? ?Past Medical History:  ?Diagnosis Date  ? Anxiety   ? Arthritis   ? Depression   ? GERD (gastroesophageal reflux disease)   ? Hyperlipidemia   ? Hypertension   ? Migraines   ? Thyroid disease   ?  ?Family History  ?Problem Relation Age of Onset  ? Headache Neg Hx   ? Colon cancer Neg Hx   ? Esophageal cancer Neg Hx   ? Rectal cancer Neg Hx   ? Stomach cancer Neg Hx   ? Breast cancer Neg Hx   ?  ?Past Surgical History:  ?Procedure Laterality Date  ? BREAST CYST EXCISION Right 1980  ? BREAST CYST EXCISION Left 1985  ? NO PAST SURGERIES    ? ?Social History  ? ?Occupational History  ? Occupation: Unemployed  ?Tobacco Use  ? Smoking status: Never  ? Smokeless tobacco: Never  ?Substance and Sexual Activity  ? Alcohol use: No  ?  Alcohol/week: 0.0 standard drinks  ? Drug use: No  ? Sexual activity: Not Currently  ?  Partners: Male  ? ? ? ? ? ?

## 2021-12-27 ENCOUNTER — Other Ambulatory Visit: Payer: Self-pay

## 2021-12-27 ENCOUNTER — Ambulatory Visit: Payer: Medicaid Other | Attending: Internal Medicine | Admitting: Internal Medicine

## 2021-12-27 ENCOUNTER — Encounter: Payer: Self-pay | Admitting: Internal Medicine

## 2021-12-27 VITALS — BP 118/78 | HR 83 | Ht 67.0 in | Wt 195.8 lb

## 2021-12-27 DIAGNOSIS — E039 Hypothyroidism, unspecified: Secondary | ICD-10-CM

## 2021-12-27 DIAGNOSIS — I152 Hypertension secondary to endocrine disorders: Secondary | ICD-10-CM | POA: Diagnosis not present

## 2021-12-27 DIAGNOSIS — E669 Obesity, unspecified: Secondary | ICD-10-CM | POA: Diagnosis not present

## 2021-12-27 DIAGNOSIS — Z1211 Encounter for screening for malignant neoplasm of colon: Secondary | ICD-10-CM

## 2021-12-27 DIAGNOSIS — E1169 Type 2 diabetes mellitus with other specified complication: Secondary | ICD-10-CM | POA: Diagnosis not present

## 2021-12-27 DIAGNOSIS — E1159 Type 2 diabetes mellitus with other circulatory complications: Secondary | ICD-10-CM | POA: Diagnosis not present

## 2021-12-27 DIAGNOSIS — E785 Hyperlipidemia, unspecified: Secondary | ICD-10-CM

## 2021-12-27 LAB — POCT GLYCOSYLATED HEMOGLOBIN (HGB A1C): Hemoglobin A1C: 6.4 % — AB (ref 4.0–5.6)

## 2021-12-27 LAB — GLUCOSE, POCT (MANUAL RESULT ENTRY): POC Glucose: 120 mg/dl — AB (ref 70–99)

## 2021-12-27 NOTE — Progress Notes (Signed)
Patient ID: [name], female    DOB: 12-22-1960  MRN: [id number]  CC: Follow-up (Follow up for diabetes , pt has no other concerns )   Subjective: [name] is a 61 y.o. female who presents for chronic ds management Her concerns today include:  HTN, HL, depression, hypothyroid, DM, obesity, tension HA, chronic LBP (minimal degen disc ds on MRI).   DIABETES TYPE 2 Last A1C:   Results for orders placed or performed in visit on 12/27/21  HgB A1c  Result Value Ref Range   Hemoglobin A1C 6.4 (A) 4.0 - 5.6 %   HbA1c POC (<> result, manual entry)     HbA1c, POC (prediabetic range)     HbA1c, POC (controlled diabetic range)    Glucose (CBG)  Result Value Ref Range   POC Glucose 120 (A) 70 - 99 mg/dl    Med Adherence:  [x]  Yes - Trulicity 1.5 mg/wk Medication side effects:  []  Yes    [x]  No Home Monitoring?  [x]  Yes 2-3 times a day before meals   []  No Home glucose results range: 114-130 Diet Adherence: [x]  Yes but gained 8 lbs since last visit.    Exercise: gets out to [place] if weather is nice Hypoglycemic episodes?: []  Yes    [x]  No Numbness of the feet? []  Yes    []  No Retinopathy hx? []  Yes    []  No Last eye exam: over due.  Plans to get exam next mth by Dr. Katy Fitch  Comments:   HTN: compliant with Norvasc and Lisinopril and took already for today.  Limits salt Checks BP every morning.  Sent me log via Mychart 2-3 wks ago.  Readings were within targeted range of less than 130/80  HL: taking and tolerating Lipitor 60 mg consistently.  LDL was 130   Thyroid:  taking Levothyroxine consistently. TSH 4 mths ago was 3.03   Back Pain:  saw Dr. Ernestina Patches at [facility].  Had x-rays.  Told she has mild arthritis.  Sent to P.T; will start next wk  Patient Active Problem List   Diagnosis Date Noted   Influenza vaccine needed 10/31/2020   Need for diphtheria-tetanus-pertussis (Tdap) vaccine 10/31/2020   Hyperlipidemia associated with type 2 diabetes mellitus ([place]) 10/31/2020    Overweight (BMI 25.0-29.9) 10/31/2020   Type 2 diabetes mellitus without complication, without long-term current use of insulin ([place]) 07/31/2020   OSA (obstructive sleep apnea) 02/02/2020   Spondylosis without myelopathy or radiculopathy, lumbar region 09/17/2017   Essential hypertension 09/05/2017   Chronic bilateral low back pain with left-sided sciatica 09/05/2017   HTN (hypertension) 05/05/2017   Hypothyroidism 02/24/2017   Mixed hyperlipidemia 02/24/2017   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 30 tablet    amitriptyline (ELAVIL) 25 MG tablet Take 1 tablet (25 mg total) by mouth at bedtime. 30 tablet 0   amLODipine (NORVASC) 10 MG tablet  Take 1 tablet (10 mg total) by mouth daily. 90 tablet 1   atorvastatin (LIPITOR) 40 MG tablet Take 1.5 tablets (60 mg total) by mouth daily. 135 tablet 2   Blood Glucose Monitoring Suppl (ACCU-CHEK GUIDE ME) w/Device KIT 1 kit by Does not apply route daily. Use as instructed to check blood sugar daily. E11.9 1 kit 0   escitalopram (LEXAPRO) 10 MG tablet Take 10 mg by mouth at bedtime.  2   glucose blood (ACCU-CHEK GUIDE) test strip USE TO check blood sugar three times daily 100 strip 6   levothyroxine (SYNTHROID) 50 MCG tablet Take 1 tablet (50 mcg total) by mouth daily. 90 tablet 3   lisinopril (ZESTRIL) 20 MG tablet Take 1 tablet (20 mg total) by mouth daily. 90 tablet 0    omeprazole (PRILOSEC) 20 MG capsule Take 1 capsule (20 mg total) by mouth daily. 90 capsule 2   propranolol (INDERAL) 80 MG tablet Take 1 tablet (80 mg total) by mouth 2 (two) times daily. 536 tablet 0   TRULICITY 1.5 UY/4.0HK SOPN INJECT 1.37m into THE SKIN Once weekly 2 mL 2   No current facility-administered medications on file prior to visit.    Allergies  Allergen Reactions   Iron Diarrhea   Metformin And Related Nausea And Vomiting   Penicillins Nausea And Vomiting    Has patient had a PCN reaction causing immediate rash, facial/tongue/throat swelling, SOB or lightheadedness with hypotension: No Has patient had a PCN reaction causing severe rash involving mucus membranes or skin necrosis: No Has patient had a PCN reaction that required hospitalization No Has patient had a PCN reaction occurring within the last 10 years: No If all of the above answers are "NO", then may proceed with Cephalosporin use.    Social History   Socioeconomic History   Marital status: Single    Spouse name: Not on file   Number of children: 1   Years of education: 12   Highest education level: Not on file  Occupational History   Occupation: Unemployed  Tobacco Use   Smoking status: Never   Smokeless tobacco: Never  Substance and Sexual Activity   Alcohol use: No    Alcohol/week: 0.0 standard drinks   Drug use: No   Sexual activity: Not Currently    Partners: Male  Other Topics Concern   Not on file  Social History Narrative   Lives with son, DShanon Brow   Caffeine use: Daily       Left handed   Social Determinants of Health   Financial Resource Strain: Not on file  Food Insecurity: Not on file  Transportation Needs: Not on file  Physical Activity: Not on file  Stress: Not on file  Social Connections: Not on file  Intimate Partner Violence: Not on file    Family History  Problem Relation Age of Onset   Headache Neg Hx    Colon cancer Neg Hx    Esophageal cancer Neg Hx    Rectal  cancer Neg Hx    Stomach cancer Neg Hx    Breast cancer Neg Hx     Past Surgical History:  Procedure Laterality Date   BREAST CYST EXCISION Right 1980   BREAST CYST EXCISION Left 1985   NO PAST SURGERIES      ROS: Review of Systems Negative except as stated above  PHYSICAL EXAM: BP 133/84    Pulse 83    Ht 5' 7"  (1.702 m)    Wt 195 lb 12.8  oz (88.8 kg)    LMP 06/06/2015    SpO2 96%    BMI 30.67 kg/m   Wt Readings from Last 3 Encounters:  12/27/21 195 lb 12.8 oz (88.8 kg)  12/21/21 187 lb (84.8 kg)  08/28/21 187 lb 3.2 oz (84.9 kg)    Physical Exam  General appearance - alert, well appearing, older caucasian female and in no distress Mental status - normal mood, behavior, speech, dress, motor activity, and thought processes Neck - supple, no significant adenopathy Chest - clear to auscultation, no wheezes, rales or rhonchi, symmetric air entry Heart - normal rate, regular rhythm, normal S1, S2, no murmurs, rubs, clicks or gallops Extremities - peripheral pulses normal, no pedal edema, no clubbing or cyanosis   CMP Latest Ref Rng & Units 08/28/2021 08/21/2020 01/31/2020  Glucose 70 - 99 mg/dL 97 306(H) 186(H)  BUN 8 - 27 mg/dL 17 10 12   Creatinine 0.57 - 1.00 mg/dL 1.08(H) 0.79 0.86  Sodium 134 - 144 mmol/L 137 137 139  Potassium 3.5 - 5.2 mmol/L 4.7 4.5 4.7  Chloride 96 - 106 mmol/L 100 99 97  CO2 20 - 29 mmol/L 26 22 24   Calcium 8.7 - 10.3 mg/dL 10.0 10.2 10.4(H)  Total Protein 6.0 - 8.5 g/dL 8.9(H) 8.7(H) 9.4(H)  Total Bilirubin 0.0 - 1.2 mg/dL 0.4 0.5 0.5  Alkaline Phos 44 - 121 IU/L 105 112 133(H)  AST 0 - 40 IU/L 18 36 44(H)  ALT 0 - 32 IU/L 33(H) 47(H) 53(H)   Lipid Panel     Component Value Date/Time   CHOL 206 (H) 08/28/2021 0931   TRIG 217 (H) 08/28/2021 0931   HDL 37 (L) 08/28/2021 0931   CHOLHDL 5.6 (H) 08/28/2021 0931   CHOLHDL 9.2 (H) 09/28/2015 1420   VLDL 61 (H) 09/28/2015 1420   LDLCALC 130 (H) 08/28/2021 0931    CBC    Component Value  Date/Time   WBC 8.2 08/28/2021 0931   WBC 5.7 05/06/2017 0144   RBC 4.92 08/28/2021 0931   RBC 4.37 05/06/2017 0144   HGB 15.7 08/28/2021 0931   HCT 44.8 08/28/2021 0931   PLT 302 08/28/2021 0931   MCV 91 08/28/2021 0931   MCH 31.9 08/28/2021 0931   MCH 30.7 05/06/2017 0144   MCHC 35.0 08/28/2021 0931   MCHC 33.5 05/06/2017 0144   RDW 12.3 08/28/2021 0931   LYMPHSABS 1.8 05/05/2017 1108   LYMPHSABS 2.4 02/17/2017 1141   MONOABS 0.7 05/05/2017 1108   EOSABS 0.1 05/05/2017 1108   EOSABS 0.1 02/17/2017 1141   BASOSABS 0.0 05/05/2017 1108   BASOSABS 0.1 02/17/2017 1141    ASSESSMENT AND PLAN: 1. Diabetes mellitus type 2 in obese (HCC) A1c at goal. Discussed and encourage healthy eating habits. Encourage to exercise several days a week for about 30 minutes.  Went over exercises that she can do in her house like walking in place while watching TV. Continue Trulicity. - HgB W4Y - Glucose (CBG)  2. Hypertension associated with diabetes ([place]) Close to goal.  However home blood pressure readings have been good.  She will continue current dose of lisinopril 20 mg and amlodipine 10 mg daily.  3. Hyperlipidemia associated with type 2 diabetes mellitus ([place]) Recheck lipid profile today.  If still not at goal we will increase the atorvastatin to 80 mg daily. - Lipid panel  4. Hypothyroidism (acquired) Doing well on levothyroxine.  Continue levothyroxine 50 mcg daily.  5. Screening for colon cancer - Fecal occult blood, imunochemical([facility])  Patient was given the opportunity to ask questions.  Patient verbalized understanding of the plan and was able to repeat key elements of the plan.   This documentation was completed using Radio producer.  Any transcriptional errors are unintentional.  Orders Placed This Encounter  Procedures   HgB A1c   Glucose (CBG)     Requested Prescriptions    No prescriptions requested or ordered in this encounter     No follow-ups on file.  Karle Plumber, MD, FACP

## 2021-12-28 ENCOUNTER — Other Ambulatory Visit: Payer: Self-pay | Admitting: Internal Medicine

## 2021-12-28 DIAGNOSIS — E1169 Type 2 diabetes mellitus with other specified complication: Secondary | ICD-10-CM

## 2021-12-28 DIAGNOSIS — E785 Hyperlipidemia, unspecified: Secondary | ICD-10-CM

## 2021-12-28 LAB — LIPID PANEL
Chol/HDL Ratio: 5.1 ratio — ABNORMAL HIGH (ref 0.0–4.4)
Cholesterol, Total: 194 mg/dL (ref 100–199)
HDL: 38 mg/dL — ABNORMAL LOW (ref >39)
LDL Chol Calc (NIH): 121 mg/dL — ABNORMAL HIGH (ref 0–99)
Triglycerides: 197 mg/dL — ABNORMAL HIGH (ref 0–149)
VLDL Cholesterol Cal: 35 mg/dL (ref 5–40)

## 2021-12-28 MED ORDER — ATORVASTATIN CALCIUM 80 MG PO TABS: 80.0000 mg | ORAL_TABLET | Freq: Every day | ORAL | 3 refills | Status: DC

## 2022-01-01 NOTE — Therapy (Signed)
?OUTPATIENT PHYSICAL THERAPY EVALUATION ? ? ?Patient Name: [name] ?MRN: [id number] ?DOB:December 10, 1960, 61 y.o., female ?Today's Date: 01/02/2022 ? ? PT End of Session - 01/02/22 1320   ? ? Visit Number 1   ? Number of Visits 8   ? Date for PT Re-Evaluation 02/27/22   ? Authorization Type M[facility] [employer]   ? Authorization - Number of Visits 27   ? PT Start Time 1330   ? PT Stop Time 3299   ? PT Time Calculation (min) 45 min   ? Activity Tolerance Patient tolerated treatment well   ? Behavior During Therapy [facility] for tasks assessed/performed   ? ?  ?  ? ?  ? ? ?Past Medical History:  ?Diagnosis Date  ? Anxiety   ? Arthritis   ? Depression   ? GERD (gastroesophageal reflux disease)   ? Hyperlipidemia   ? Hypertension   ? Migraines   ? Thyroid disease   ? ?Past Surgical History:  ?Procedure Laterality Date  ? BREAST CYST EXCISION Right 1980  ? BREAST CYST EXCISION Left 1985  ? NO PAST SURGERIES    ? ?Patient Active Problem List  ? Diagnosis Date Noted  ? Influenza vaccine needed 10/31/2020  ? Need for diphtheria-tetanus-pertussis (Tdap) vaccine 10/31/2020  ? Hyperlipidemia associated with type 2 diabetes mellitus ([place]) 10/31/2020  ? Overweight (BMI 25.0-29.9) 10/31/2020  ? Type 2 diabetes mellitus without complication, without long-term current use of insulin ([place]) 07/31/2020  ? OSA (obstructive sleep apnea) 02/02/2020  ? Spondylosis without myelopathy or radiculopathy, lumbar region 09/17/2017  ? Essential hypertension 09/05/2017  ? Chronic bilateral low back pain with left-sided sciatica 09/05/2017  ? HTN (hypertension) 05/05/2017  ? Hypothyroidism 02/24/2017  ? Mixed hyperlipidemia 02/24/2017  ? 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  ? Sleep related headaches 01/23/2017  ? Snoring 01/23/2017  ? 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  ? Chronic headaches 10/30/2016  ? Obesity (BMI 30.0-34.9) 05/24/2016  ? PMB (postmenopausal bleeding) 05/24/2016  ? Back pain of lumbar region with sciatica 09/28/2015  ? ? ?PCP: Ladell Pier, MD ? ?REFERRING PROVIDER: Persons, Bevely Palmer, [place] ? ?REFERRING DIAG: Chronic left-sided low back pain ? ?THERAPY DIAG:  ?Chronic bilateral low back pain, unspecified whether sciatica present ? ?Muscle weakness (generalized) ? ?ONSET DATE: "a few years" ? ?SUBJECTIVE:       ?SUBJECTIVE STATEMENT: ?Patient she used to have injections and nerve ablations for her back pain which did not work, she was told she had a touch of arthritis. Currently she has trouble walking longer distance and standing for long periods due to her back pain. She is only able to walk about 15 minutes before needing a rest break due to pain. Patient also reports sitting on hard chair will increase her back pain.  ? ?PERTINENT HISTORY:  ?DM ? ?PAIN:  ?Are you having pain?  ?Yes: NPRS scale: 0/10 (patient reports 11/10 at worse) ?Pain location: Lower back, bilateral ?Pain description: Tightening ?Aggravating factors: Standing, walking, sitting extended periods ?Relieving factors: Medication ? ?PRECAUTIONS: None ? ?WEIGHT BEARING RESTRICTIONS No ? ?FALLS:  ?Has patient fallen in last 6 months? No ? ?LIVING ENVIRONMENT: ?Lives with: lives with their son ?Lives in: House/apartment ?Stairs: Yes; Internal: 1 flight steps; on left going up and External: 1 steps; none ? ?OCCUPATION: Disability ? ?PLOF: Independent ? ?PATIENT GOALS: Pain relief and improve walking ability ? ? ?OBJECTIVE:  ?DIAGNOSTIC FINDINGS:  ?Lumbar X-ray 12/21/2021: lumbar spine demonstrate well-maintained alignment very similar x-rays to previous  she does have some calcification within the aorta but no different than her previous x-rays.  Good spacing no listhesis that I can appreciate minor spurring of the endplates. ? ?PATIENT SURVEYS:  ?Modified Oswestry 33/50  ? ?SCREENING FOR RED FLAGS: ?Negative ? ?COGNITION: ?Overall cognitive status: Within functional limits for tasks assessed   ?  ?SENSATION: ?WFL ? ?MUSCLE LENGTH: ?Hamstring, hip flexor, and quad flexibility  limitation ? ?POSTURE:  ?Rounded shoulder and forward head posture,  ? ?PALPATION: ?Mildly tender to palpation bilateral paraspinals ? ?LUMBAR ROM:  ? ?Active  A/ROM  ?01/02/2022  ?Flexion 75%  ?Extension 75%  ?Right lateral flexion 75%  ?Left lateral flexion 75%  ?Right rotation 75%  ?Left rotation 75%  ?Patient denies any change in symptoms with active motion ? ?LE ROM: ?  HIP PROM grossly WFL and non-painful ? ?LE MMT: ? ?MMT Right ?01/02/2022 Left ?01/02/2022  ?Core (DLLT) 3  ?Hip flexion 4 4  ?Hip extension 3 3-  ?Hip abduction 3 3-  ?Knee flexion 5 5  ?Knee extension 5 5  ? ?LUMBAR SPECIAL TESTS:  ?Lumbar radicular testing negative ? ?GAIT: ?Assistive device utilized: None ?Level of assistance: Complete Independence ?Comments: decreased gait speed, compensated trendelenburg bilaterally ? ? ?TODAY'S TREATMENT  ?Piriformis stretch 2 x 20 sec each ?LTR 3 x 5 sec each ?Seated hamstring stretch 2 x 20 sec each ?Bridge x 10 with 3 sec hold ?SLR x 10 each ?Side clamshell with red x 10 each ? ?PATIENT EDUCATION:  ?Education details: Exam findings, POC, HEP ?Person educated: Patient ?Education method: Explanation, Demonstration, Tactile cues, Verbal cues, and Handouts ?Education comprehension: verbalized understanding, returned demonstration, verbal cues required, tactile cues required, and needs further education ? ?HOME EXERCISE PROGRAM: ?Access Code: [email or web address] ? ? ?ASSESSMENT: ?CLINICAL IMPRESSION: ?Patient is a 61 y.o. female who was seen today for physical therapy evaluation and treatment for chronic low back pain. Her pain seems musculoskeletal in nature without any radicular symptoms. She exhibits slight limitations in lumbar motion with gross strength deficits of the core and hips. ? ? ?OBJECTIVE IMPAIRMENTS Abnormal gait, decreased activity tolerance, decreased ROM, decreased strength, impaired flexibility, postural dysfunction, and pain.  ? ?ACTIVITY LIMITATIONS community activity, occupation, and shopping.   ? ?PERSONAL FACTORS Fitness, Past/current experiences, and Time since onset of injury/illness/exacerbation are also affecting patient's functional outcome.  ? ? ?REHAB POTENTIAL: Fair ? ?CLINICAL DECISION MAKING: Stable/uncomplicated ? ?EVALUATION COMPLEXITY: Low ? ? ?GOALS: ?Goals reviewed with patient? Yes ? ?SHORT TERM GOALS: Target date: 01/30/2022 ? ?Patient will be I with initial HEP in order to progress with therapy. ?Baseline: HEP provided at evaluation ?Goal status: INITIAL ? ?2.  Patient will report pain level no more than 6/10 with activity in order to reduce functional limitations. ?Baseline: patient reports pain up to 11/10 ?Goal status: INITIAL ? ?LONG TERM GOALS: Target date: 02/27/2022 ? ?Patient will be I with final HEP to maintain progress from PT. ?Baseline: HEP provided at evaluation ?Goal status: INITIAL ? ?2.  Patient will report Modified ODI </= 20/50 in order to indicate improved functional ability ?Baseline: Modified ODI 33/50 ?Goal status: INITIAL ? ?3.  Patient will be able to walk >/= 45 minutes in order to walk to grocery store. ?Baseline: patient reports increased low back pain with walking 15 minutes and need to stop and rest ?Goal status: INITIAL ? ?4.  Patient will demonstrate core and hip strength grossly >/= 4-/5 MMT in order to improve standing and walking tolerance ?Baseline: grossly 3/5 MMT ?Goal  status: INITIAL ? ? ?PLAN: ?PT FREQUENCY: 1-2x/week ? ?PT DURATION: 8 weeks ? ?PLANNED INTERVENTIONS: Therapeutic exercises, Therapeutic activity, Neuromuscular re-education, Balance training, Gait training, Patient/Family education, Joint manipulation, Joint mobilization, Aquatic Therapy, Dry Needling, Electrical stimulation, Spinal manipulation, Spinal mobilization, Cryotherapy, Moist heat, and Manual therapy. ? ?PLAN FOR NEXT SESSION: Review HEP and progress PRN, stretching for hip flexor/quad/hamstring/lumbar, progress strengthening for hip and core, initiate lifting for strength  training ? ? ?Hilda Blades, PT, DPT, LAT, ATC ?01/02/22  2:33 PM ?Phone: [phone] ?Fax: [phone] ? ?

## 2022-01-02 ENCOUNTER — Other Ambulatory Visit: Payer: Self-pay

## 2022-01-02 ENCOUNTER — Encounter: Payer: Self-pay | Admitting: Physical Therapy

## 2022-01-02 ENCOUNTER — Ambulatory Visit: Payer: Medicaid Other | Attending: Physician Assistant | Admitting: Physical Therapy

## 2022-01-02 DIAGNOSIS — M545 Low back pain, unspecified: Secondary | ICD-10-CM | POA: Diagnosis present

## 2022-01-02 DIAGNOSIS — M6281 Muscle weakness (generalized): Secondary | ICD-10-CM | POA: Diagnosis present

## 2022-01-02 DIAGNOSIS — G8929 Other chronic pain: Secondary | ICD-10-CM | POA: Insufficient documentation

## 2022-01-02 NOTE — Patient Instructions (Signed)
Access Code: [email or web address] ?URL: [email or web address] ?Date: 01/02/2022 ?Prepared by: Hilda Blades ? ?Exercises ?Supine Piriformis Stretch with Foot on Ground - 2 x daily - 3 reps - 20 seconds hold ?Supine Lower Trunk Rotation - 2 x daily - 5 reps - 5 seconds hold ?Seated Hamstring Stretch - 2 x daily - 3 reps - 20 seconds hold ?Bridge - 1 x daily - 2 sets - 10 reps - 3 seconds hold ?Straight Leg Raise - 1 x daily - 2 sets - 10 reps ?Clam with Resistance - 1 x daily - 2 sets - 10 reps ? ?

## 2022-01-07 ENCOUNTER — Other Ambulatory Visit: Payer: Self-pay

## 2022-01-07 ENCOUNTER — Encounter: Payer: Self-pay | Admitting: Physical Medicine and Rehabilitation

## 2022-01-07 ENCOUNTER — Ambulatory Visit (INDEPENDENT_AMBULATORY_CARE_PROVIDER_SITE_OTHER): Payer: Medicaid Other | Admitting: Physical Medicine and Rehabilitation

## 2022-01-07 DIAGNOSIS — M7918 Myalgia, other site: Secondary | ICD-10-CM

## 2022-01-07 DIAGNOSIS — M545 Low back pain, unspecified: Secondary | ICD-10-CM

## 2022-01-07 DIAGNOSIS — M546 Pain in thoracic spine: Secondary | ICD-10-CM | POA: Diagnosis not present

## 2022-01-07 DIAGNOSIS — G8929 Other chronic pain: Secondary | ICD-10-CM | POA: Diagnosis not present

## 2022-01-07 NOTE — Progress Notes (Signed)
"  Reevaluation" low back' denies radiculopathy. ?Another injection in the future? ?Has had RF before (per patient) ?

## 2022-01-07 NOTE — Progress Notes (Signed)
? ?[name] - 61 y.o. female MRN [id number]  Date of birth: 02-08-1961 ? ?Office Visit Note: ?Visit Date: 01/07/2022 ?PCP: Ladell Pier, MD ?Referred by: Ladell Pier, MD ? ?Subjective: ?Chief Complaint  ?Patient presents with  ? Lower Back - Pain  ? ?HPI: [name] is a 61 y.o. female who comes in today at the request of Murphy, PA for evaluation of chronic, worsening and severe bilateral lower back pain radiating up to thoracic back.  Patient reports pain has been ongoing for several months and worsens with movement, activity and standing.  She describes pain as a sore and tight sensation, currently rates as 7 out of 10.  Patient reports some relief of pain with home exercise regimen, rest and use of OTC medications.  Patient's lumbar MRI from 2018 exhibits minimal degenerative disc disease in the lumbar spine, no high-grade spinal canal stenosis, no neural impingement. Patient was recently seen by Bevely Palmer Persons, PA whom placed referral for formal physical therapy.  Patient states she attended her first physical therapy session at [facility] last week and is scheduled for several weeks of therapy.  Patient has a history of bilateral L4-L5 radiofrequency ablation in 2019 performed in our office which she reports did not help to alleviate her pain.  Patient states her pain is causing her difficulty sleeping at night, also reports she is having trouble completing daily tasks.  Patient denies focal weakness, numbness and tingling. Patient denies recent trauma or falls.  ? ?Review of Systems  ?Musculoskeletal:  Positive for back pain and myalgias.  ?Neurological:  Negative for tingling, sensory change, focal weakness and weakness.  ?All other systems reviewed and are negative. Otherwise per HPI. ? ?Assessment & Plan: ?Visit Diagnoses:  ?  ICD-10-CM   ?1. Chronic bilateral low back pain without sciatica  M54.50   ? G89.29   ?  ?2. Chronic bilateral  thoracic back pain  M54.6   ? G89.29   ?  ?3. Myofascial pain syndrome  M79.18   ?  ?   ?Plan: Findings:  ?Chronic, worsening and severe bilateral lower back pain radiating up to thoracic spine.  Patient continues to have severe pain despite good conservative therapies such as formal physical therapy, home exercise regimen, rest and use of OTC medications.  Patients clinical presentation and exam are consistent with myofascial pain syndrome.  We do not feel her pain directly correlates to her spine.  We believe the next step is to have patient continue with physical therapy and home exercise regimen.  We will have patient follow up with [place] in approximately 6 weeks for re-evaluation. If patients pain worsens of changes in nature we would consider obtaining new lumbar MRI imaging. Patient encouraged to remain active and to continue home exercise regimen as tolerated. No red flag symptoms noted upon exam today.   ? ?Meds & Orders: No orders of the defined types were placed in this encounter. ? No orders of the defined types were placed in this encounter. ?  ?Follow-up: Return for 6 week follow up for re-evaluation.  ? ?Procedures: ?No procedures performed  ?   ? ?Clinical History: ?No specialty comments available.  ? ?She reports that she has never smoked. She has never used smokeless tobacco.  ?Recent Labs  ?  04/20/21 ?1015 08/28/21 ?2426 12/27/21 ?1550  ?HGBA1C 5.5 5.8 6.4*  ? ? ?Objective:  VS:  HT:    WT:   BMI:  BP:   HR: bpm  TEMP: ( )  RESP:  ?Physical Exam ?Vitals and nursing note reviewed.  ?HENT:  ?   Head: Normocephalic and atraumatic.  ?   Right Ear: External ear normal.  ?   Left Ear: External ear normal.  ?   Nose: Nose normal.  ?   Mouth/Throat:  ?   Mouth: Mucous membranes are moist.  ?Eyes:  ?   Extraocular Movements: Extraocular movements intact.  ?Cardiovascular:  ?   Rate and Rhythm: Normal rate.  ?   Pulses: Normal pulses.  ?Pulmonary:  ?   Effort: Pulmonary effort is normal.  ?Abdominal:   ?   General: Abdomen is flat. There is no distension.  ?Musculoskeletal:     ?   General: Tenderness present.  ?   Cervical back: Normal range of motion.  ?   Comments: Pt rises from seated position to standing without difficulty. Good lumbar range of motion. Strong distal strength without clonus, no pain upon palpation of greater trochanters. Sensation intact bilaterally. Walks independently, gait steady.   ?Skin: ?   General: Skin is warm and dry.  ?   Capillary Refill: Capillary refill takes less than 2 seconds.  ?Neurological:  ?   General: No focal deficit present.  ?   Mental Status: She is alert and oriented to person, place, and time.  ?Psychiatric:     ?   Mood and Affect: Mood normal.     ?   Behavior: Behavior normal.  ?  ?Ortho Exam ? ?Imaging: ?No results found. ? ?Past Medical/Family/Surgical/Social History: ?Medications & Allergies reviewed per EMR, new medications updated. ?Patient Active Problem List  ? Diagnosis Date Noted  ? Influenza vaccine needed 10/31/2020  ? Need for diphtheria-tetanus-pertussis (Tdap) vaccine 10/31/2020  ? Hyperlipidemia associated with type 2 diabetes mellitus ([place]) 10/31/2020  ? Overweight (BMI 25.0-29.9) 10/31/2020  ? Type 2 diabetes mellitus without complication, without long-term current use of insulin ([place]) 07/31/2020  ? OSA (obstructive sleep apnea) 02/02/2020  ? Spondylosis without myelopathy or radiculopathy, lumbar region 09/17/2017  ? Essential hypertension 09/05/2017  ? Chronic bilateral low back pain with left-sided sciatica 09/05/2017  ? HTN (hypertension) 05/05/2017  ? Hypothyroidism 02/24/2017  ? Mixed hyperlipidemia 02/24/2017  ? 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  ? Sleep related headaches 01/23/2017  ? Snoring 01/23/2017  ? 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  ? Chronic headaches 10/30/2016  ? Obesity (BMI 30.0-34.9) 05/24/2016  ? PMB (postmenopausal bleeding) 05/24/2016  ? Back pain of lumbar region with sciatica 09/28/2015  ? ?Past Medical  History:  ?Diagnosis Date  ? Anxiety   ? Arthritis   ? Depression   ? GERD (gastroesophageal reflux disease)   ? Hyperlipidemia   ? Hypertension   ? Migraines   ? Thyroid disease   ? ?Family History  ?Problem Relation Age of Onset  ? Headache Neg Hx   ? Colon cancer Neg Hx   ? Esophageal cancer Neg Hx   ? Rectal cancer Neg Hx   ? Stomach cancer Neg Hx   ? Breast cancer Neg Hx   ? ?Past Surgical History:  ?Procedure Laterality Date  ? BREAST CYST EXCISION Right 1980  ? BREAST CYST EXCISION Left 1985  ? NO PAST SURGERIES    ? ?Social History  ? ?Occupational History  ? Occupation: Unemployed  ?Tobacco Use  ? Smoking status: Never  ? Smokeless tobacco: Never  ?Substance and Sexual Activity  ? Alcohol use: No  ?  Alcohol/week: 0.0 standard drinks  ? Drug use: No  ? Sexual activity: Not Currently  ?  Partners: Male  ? ? ?

## 2022-01-09 ENCOUNTER — Encounter: Payer: Self-pay | Admitting: Physical Therapy

## 2022-01-09 ENCOUNTER — Other Ambulatory Visit: Payer: Self-pay

## 2022-01-09 ENCOUNTER — Ambulatory Visit: Payer: Medicaid Other | Admitting: Physical Therapy

## 2022-01-09 ENCOUNTER — Other Ambulatory Visit: Payer: Self-pay | Admitting: Internal Medicine

## 2022-01-09 DIAGNOSIS — M6281 Muscle weakness (generalized): Secondary | ICD-10-CM

## 2022-01-09 DIAGNOSIS — M545 Low back pain, unspecified: Secondary | ICD-10-CM | POA: Diagnosis not present

## 2022-01-09 DIAGNOSIS — G8929 Other chronic pain: Secondary | ICD-10-CM

## 2022-01-09 DIAGNOSIS — I10 Essential (primary) hypertension: Secondary | ICD-10-CM

## 2022-01-09 NOTE — Therapy (Signed)
?OUTPATIENT PHYSICAL THERAPY TREATMENT NOTE ? ? ?Patient Name: [name] ?MRN: [id number] ?DOB:[birth year], 61 y.o., female ?Today's Date: 01/09/2022 ? ?PCP: Ladell Pier, MD ?REFERRING PROVIDER: Persons, Bevely Palmer, PA ? ? PT End of Session - 01/09/22 1256   ? ? Visit Number 2   ? Number of Visits 8   ? Date for PT Re-Evaluation 02/27/22   ? Authorization Type MCD [employer]   ? Authorization - Number of Visits 27   ? PT Start Time 9833   ? PT Stop Time 1333   ? PT Time Calculation (min) 38 min   ? ?  ?  ? ?  ? ? ?Past Medical History:  ?Diagnosis Date  ? Anxiety   ? Arthritis   ? Depression   ? GERD (gastroesophageal reflux disease)   ? Hyperlipidemia   ? Hypertension   ? Migraines   ? Thyroid disease   ? ?Past Surgical History:  ?Procedure Laterality Date  ? BREAST CYST EXCISION Right 1980  ? BREAST CYST EXCISION Left 1985  ? NO PAST SURGERIES    ? ?Patient Active Problem List  ? Diagnosis Date Noted  ? Influenza vaccine needed 10/31/2020  ? Need for diphtheria-tetanus-pertussis (Tdap) vaccine 10/31/2020  ? Hyperlipidemia associated with type 2 diabetes mellitus ([place]) 10/31/2020  ? Overweight (BMI 25.0-29.9) 10/31/2020  ? Type 2 diabetes mellitus without complication, without long-term current use of insulin ([place]) 07/31/2020  ? OSA (obstructive sleep apnea) 02/02/2020  ? Spondylosis without myelopathy or radiculopathy, lumbar region 09/17/2017  ? Essential hypertension 09/05/2017  ? Chronic bilateral low back pain with left-sided sciatica 09/05/2017  ? HTN (hypertension) 05/05/2017  ? Hypothyroidism 02/24/2017  ? Mixed hyperlipidemia 02/24/2017  ? 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  ? Sleep related headaches 01/23/2017  ? Snoring 01/23/2017  ? 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  ? Chronic headaches 10/30/2016  ? Obesity (BMI 30.0-34.9) 05/24/2016  ? PMB (postmenopausal bleeding) 05/24/2016  ? Back pain of lumbar region with sciatica 09/28/2015  ? ?PCP: Ladell Pier, MD ?  ?REFERRING PROVIDER:  Persons, Bevely Palmer, [place] ? ?THERAPY DIAG:  ?No diagnosis found. ? ?PERTINENT HISTORY: DM  ? ?PRECAUTIONS: None ? ?SUBJECTIVE: I ma not having any pain today. I am surprised. I have been doing the exercises.  ? ?PAIN:  ?Are you having pain?  ?Yes: NPRS scale: 0/10 (patient reports 11/10 at worse) ?Pain location: Lower back, bilateral ?Pain description: Tightening ?Aggravating factors: Standing, walking, sitting extended periods ?Relieving factors: Medication ? ? ? ? ? ?OBJECTIVE:  ?DIAGNOSTIC FINDINGS:  ?Lumbar X-ray 12/21/2021: lumbar spine demonstrate well-maintained alignment very similar x-rays to previous she does have some calcification within the aorta but no different than her previous x-rays.  Good spacing no listhesis that I can appreciate minor spurring of the endplates. ?  ?PATIENT SURVEYS:  ?Modified Oswestry 33/50  ?  ?SCREENING FOR RED FLAGS: ?Negative ?  ?COGNITION: ?Overall cognitive status: Within functional limits for tasks assessed               ?           ?SENSATION: ?WFL ?  ?MUSCLE LENGTH: ?Hamstring, hip flexor, and quad flexibility limitation ?  ?POSTURE:  ?Rounded shoulder and forward head posture,  ?  ?PALPATION: ?Mildly tender to palpation bilateral paraspinals ?  ?LUMBAR ROM:  ?  ?Active  A/ROM  ?01/02/2022  ?Flexion 75%  ?Extension 75%  ?Right lateral flexion 75%  ?Left lateral flexion 75%  ?Right rotation 75%  ?Left  rotation 75%  ?Patient denies any change in symptoms with active motion ?  ?LE ROM: ?                      HIP PROM grossly WFL and non-painful ?  ?LE MMT: ?  ?MMT Right ?01/02/2022 Left ?01/02/2022  ?Core (DLLT) 3  ?Hip flexion 4 4  ?Hip extension 3 3-  ?Hip abduction 3 3-  ?Knee flexion 5 5  ?Knee extension 5 5  ?  ?LUMBAR SPECIAL TESTS:  ?Lumbar radicular testing negative ?  ?GAIT: ?Assistive device utilized: None ?Level of assistance: Complete Independence ?Comments: decreased gait speed, compensated trendelenburg bilaterally ?  ?  ?TODAY'S TREATMENT  ?[facility] Adult PT Treatment:                                                 DATE: 01/09/22 ?Therapeutic Exercise: ?Nustep L5 x 6 min ?Sit-stand x 10 ?Bridge 10 x 2  ?S/L clam 10 x 2 red each ?Bridge with red band ER 10 x 2  ?SLR 10 x 2 cues for neutral spine  ?Hip abct 10 x 2 each ?SKTC ?LTR ?Hip flexor stretch EOM bilat  ?Seated hamstring stretch bilat ? ? ?INITIAL TREATMENT ?Piriformis stretch 2 x 20 sec each ?LTR 3 x 5 sec each ?Seated hamstring stretch 2 x 20 sec each ?Bridge x 10 with 3 sec hold ?SLR x 10 each ?Side clamshell with red x 10 each ?  ?PATIENT EDUCATION:  ?Education details: Exam findings, POC, HEP ?Person educated: Patient ?Education method: Explanation, Demonstration, Tactile cues, Verbal cues, and Handouts ?Education comprehension: verbalized understanding, returned demonstration, verbal cues required, tactile cues required, and needs further education ?  ?HOME EXERCISE PROGRAM: ?Access Code: [email or web address] ?URL: [email or web address] ?Date: 01/09/2022 ?Prepared by: Hessie Diener ? ?Exercises ?Supine Piriformis Stretch with Foot on Ground - 2 x daily - 3 reps - 20 seconds hold ?Supine Lower Trunk Rotation - 2 x daily - 5 reps - 5 seconds hold ?Seated Hamstring Stretch - 2 x daily - 3 reps - 20 seconds hold ?Bridge - 1 x daily - 2 sets - 10 reps - 3 seconds hold ?Straight Leg Raise - 1 x daily - 2 sets - 10 reps ?Clam with Resistance - 1 x daily - 2 sets - 10 reps ?Modified Thomas Stretch - 1 x daily - 7 x weekly - 1 sets - 3 reps - 30 hold ? ? ?  ?  ?ASSESSMENT: ?CLINICAL IMPRESSION: ?Patient reports compliance with HEP. Able to progress mat based strengthening and begin sit-stands. She reported muscle fatigue , no increased pain.  ?  ?  ?OBJECTIVE IMPAIRMENTS Abnormal gait, decreased activity tolerance, decreased ROM, decreased strength, impaired flexibility, postural dysfunction, and pain.  ?  ?ACTIVITY LIMITATIONS community activity, occupation, and shopping.  ?  ?PERSONAL FACTORS Fitness, Past/current  experiences, and Time since onset of injury/illness/exacerbation are also affecting patient's functional outcome.  ?  ?  ?REHAB POTENTIAL: Fair ?  ?CLINICAL DECISION MAKING: Stable/uncomplicated ?  ?EVALUATION COMPLEXITY: Low ?  ?  ?GOALS: ?Goals reviewed with patient? Yes ?  ?SHORT TERM GOALS: Target date: 01/30/2022 ?  ?Patient will be I with initial HEP in order to progress with therapy. ?Baseline: HEP provided at evaluation ?Goal status: INITIAL ?  ?2.  Patient will report pain level no more than 6/10 with  activity in order to reduce functional limitations. ?Baseline: patient reports pain up to 11/10 ?Goal status: INITIAL ?  ?LONG TERM GOALS: Target date: 02/27/2022 ?  ?Patient will be I with final HEP to maintain progress from PT. ?Baseline: HEP provided at evaluation ?Goal status: INITIAL ?  ?2.  Patient will report Modified ODI </= 20/50 in order to indicate improved functional ability ?Baseline: Modified ODI 33/50 ?Goal status: INITIAL ?  ?3.  Patient will be able to walk >/= 45 minutes in order to walk to grocery store. ?Baseline: patient reports increased low back pain with walking 15 minutes and need to stop and rest ?Goal status: INITIAL ?  ?4.  Patient will demonstrate core and hip strength grossly >/= 4-/5 MMT in order to improve standing and walking tolerance ?Baseline: grossly 3/5 MMT ?Goal status: INITIAL ?  ?  ?PLAN: ?PT FREQUENCY: 1-2x/week ?  ?PT DURATION: 8 weeks ?  ?PLANNED INTERVENTIONS: Therapeutic exercises, Therapeutic activity, Neuromuscular re-education, Balance training, Gait training, Patient/Family education, Joint manipulation, Joint mobilization, Aquatic Therapy, Dry Needling, Electrical stimulation, Spinal manipulation, Spinal mobilization, Cryotherapy, Moist heat, and Manual therapy. ?  ?PLAN FOR NEXT SESSION: Review HEP and progress PRN, stretching for hip flexor/quad/hamstring/lumbar, progress strengthening for hip and core, initiate lifting for strength training ?  ?   ? ? ? ?Hessie Diener, PTA ?01/09/22 1:30 PM ?Phone: [phone] ?Fax: [phone]  ? ?   ?

## 2022-01-10 ENCOUNTER — Telehealth: Payer: Self-pay | Admitting: Physical Medicine and Rehabilitation

## 2022-01-10 NOTE — Telephone Encounter (Signed)
Pt called asking to set an appt. Please call pt at [phone]. ?

## 2022-01-17 ENCOUNTER — Ambulatory Visit: Payer: Medicaid Other | Admitting: Physical Therapy

## 2022-01-22 NOTE — Therapy (Signed)
?OUTPATIENT PHYSICAL THERAPY TREATMENT NOTE ? ? ?Patient Name: [name] ?MRN: [id number] ?DOB:1961-07-18, 61 y.o., female ?Today's Date: 01/23/2022 ? ?PCP: Ladell Pier, MD ?REFERRING PROVIDER: Persons, Bevely Palmer, [place] ? ? PT End of Session - 01/23/22 1217   ? ? Visit Number 3   ? Number of Visits 8   ? Date for PT Re-Evaluation 02/27/22   ? Authorization Type [employer]   ? Authorization - Visit Number 3   ? Authorization - Number of Visits 27   ? PT Start Time 1220   ? PT Stop Time 1300   ? PT Time Calculation (min) 40 min   ? Activity Tolerance Patient tolerated treatment well   ? Behavior During Therapy [facility] for tasks assessed/performed   ? ?  ?  ? ?  ? ? ? ?Past Medical History:  ?Diagnosis Date  ? Anxiety   ? Arthritis   ? Depression   ? GERD (gastroesophageal reflux disease)   ? Hyperlipidemia   ? Hypertension   ? Migraines   ? Thyroid disease   ? ?Past Surgical History:  ?Procedure Laterality Date  ? BREAST CYST EXCISION Right 1980  ? BREAST CYST EXCISION Left 1985  ? NO PAST SURGERIES    ? ?Patient Active Problem List  ? Diagnosis Date Noted  ? Influenza vaccine needed 10/31/2020  ? Need for diphtheria-tetanus-pertussis (Tdap) vaccine 10/31/2020  ? Hyperlipidemia associated with type 2 diabetes mellitus ([place]) 10/31/2020  ? Overweight (BMI 25.0-29.9) 10/31/2020  ? Type 2 diabetes mellitus without complication, without long-term current use of insulin ([place]) 07/31/2020  ? OSA (obstructive sleep apnea) 02/02/2020  ? Spondylosis without myelopathy or radiculopathy, lumbar region 09/17/2017  ? Essential hypertension 09/05/2017  ? Chronic bilateral low back pain with left-sided sciatica 09/05/2017  ? HTN (hypertension) 05/05/2017  ? Hypothyroidism 02/24/2017  ? Mixed hyperlipidemia 02/24/2017  ? 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  ? Sleep related headaches 01/23/2017  ? Snoring 01/23/2017  ? 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  ? Chronic headaches 10/30/2016  ? Obesity (BMI 30.0-34.9) 05/24/2016  ? PMB  (postmenopausal bleeding) 05/24/2016  ? Back pain of lumbar region with sciatica 09/28/2015  ? ?PCP: Ladell Pier, MD ?  ?REFERRING PROVIDER: Persons, Bevely Palmer, [place] ? ?THERAPY DIAG:  ?Chronic bilateral low back pain, unspecified whether sciatica present ? ?Muscle weakness (generalized) ? ?PERTINENT HISTORY: DM  ? ?PRECAUTIONS: None ? ?SUBJECTIVE: Pt report 4/10 LBP today, adding that she has not been adherent to her HEP the past week due to being sick. ? ?PAIN:  ?Are you having pain?  ?Yes: NPRS scale: 4/10 (patient reports 11/10 at worse) ?Pain location: Lower back, bilateral ?Pain description: Tightening ?Aggravating factors: Standing, walking, sitting extended periods ?Relieving factors: Medication ? ? ? ? ? ?OBJECTIVE:  ?*Unless otherwise noted, objective information collected previously* ? ?DIAGNOSTIC FINDINGS:  ?Lumbar X-ray 12/21/2021: lumbar spine demonstrate well-maintained alignment very similar x-rays to previous she does have some calcification within the aorta but no different than her previous x-rays.  Good spacing no listhesis that I can appreciate minor spurring of the endplates. ?  ?PATIENT SURVEYS:  ?Modified Oswestry 33/50  ?  ?SCREENING FOR RED FLAGS: ?Negative ?  ?COGNITION: ?Overall cognitive status: Within functional limits for tasks assessed               ?           ?SENSATION: ?WFL ?  ?MUSCLE LENGTH: ?Hamstring, hip flexor, and quad flexibility limitation ?  ?POSTURE:  ?  Rounded shoulder and forward head posture,  ?  ?PALPATION: ?Mildly tender to palpation bilateral paraspinals ?  ?LUMBAR ROM:  ?  ?Active  A/ROM  ?01/02/2022  ?Flexion 75%  ?Extension 75%  ?Right lateral flexion 75%  ?Left lateral flexion 75%  ?Right rotation 75%  ?Left rotation 75%  ?Patient denies any change in symptoms with active motion ?  ?LE ROM: ?                      HIP PROM grossly WFL and non-painful ?  ?LE MMT: ?  ?MMT Right ?01/02/2022 Left ?01/02/2022  ?Core (DLLT) 3  ?Hip flexion 4 4  ?Hip extension 3 3-  ?Hip  abduction 3 3-  ?Knee flexion 5 5  ?Knee extension 5 5  ?  ?LUMBAR SPECIAL TESTS:  ?Lumbar radicular testing negative ?  ?GAIT: ?Assistive device utilized: None ?Level of assistance: Complete Independence ?Comments: decreased gait speed, compensated trendelenburg bilaterally ?  ?  ?TODAY'S TREATMENT  ? ?[facility] Adult PT Treatment:                                                DATE: 01/23/2022 ?Therapeutic Exercise: ?NuStep level 4 x5 minutes while collecting subjective information ?Supine 90/90 abdominal isometric against handhold resistance 3x30sec ?DKTC 3x30sec ?Side knee plank x4 to failure BIL ?Standing Cybex hip abduction with 17.5# cable 2x10 BIL ?Standing Cybex hip extension with 17.5# cable 2x10 BIL ?Standing Cybex hip flexion with 17.5# cable 2x10 ?Standing abdominal press-down with 20# cable 2x10 ?Standing dead lift with 20# cable 2x10 ?Manual Therapy: ?N/A ?Neuromuscular re-ed: ?N/A ?Therapeutic Activity: ?N/A ?Modalities: ?N/A ?Self Care: ?N/A ? ? ?[place] Adult PT Treatment:                                                DATE: 01/09/22 ?Therapeutic Exercise: ?Nustep L5 x 6 min ?Sit-stand x 10 ?Bridge 10 x 2  ?S/L clam 10 x 2 red each ?Bridge with red band ER 10 x 2  ?SLR 10 x 2 cues for neutral spine  ?Hip abct 10 x 2 each ?SKTC ?LTR ?Hip flexor stretch EOM bilat  ?Seated hamstring stretch bilat ? ? ?INITIAL TREATMENT ?Piriformis stretch 2 x 20 sec each ?LTR 3 x 5 sec each ?Seated hamstring stretch 2 x 20 sec each ?Bridge x 10 with 3 sec hold ?SLR x 10 each ?Side clamshell with red x 10 each ?  ?PATIENT EDUCATION:  ?Education details: Exam findings, POC, HEP ?Person educated: Patient ?Education method: Explanation, Demonstration, Tactile cues, Verbal cues, and Handouts ?Education comprehension: verbalized understanding, returned demonstration, verbal cues required, tactile cues required, and needs further education ?  ?HOME EXERCISE PROGRAM: ?Access Code: [email or web address] ?URL:  [email or web address] ?Date: 01/09/2022 ?Prepared by: Hessie Diener ? ?Exercises ?Supine Piriformis Stretch with Foot on Ground - 2 x daily - 3 reps - 20 seconds hold ?Supine Lower Trunk Rotation - 2 x daily - 5 reps - 5 seconds hold ?Seated Hamstring Stretch - 2 x daily - 3 reps - 20 seconds hold ?Bridge - 1 x daily - 2 sets - 10 reps - 3 seconds hold ?Straight Leg Raise - 1 x daily - 2 sets - 10 reps ?Clam with  Resistance - 1 x daily - 2 sets - 10 reps ?Modified Thomas Stretch - 1 x daily - 7 x weekly - 1 sets - 3 reps - 30 hold ? ? ?  ?  ?ASSESSMENT: ?CLINICAL IMPRESSION: ?Pt responded well to all interventions today, demonstrating good form and no increase in pain with progress core/ hip strengthening exercises. She will continue to benefit from skilled PT to address her primary impairments and return to her prior level of function with less limitation. ?  ?  ?OBJECTIVE IMPAIRMENTS Abnormal gait, decreased activity tolerance, decreased ROM, decreased strength, impaired flexibility, postural dysfunction, and pain.  ?  ?ACTIVITY LIMITATIONS community activity, occupation, and shopping.  ?  ?PERSONAL FACTORS Fitness, Past/current experiences, and Time since onset of injury/illness/exacerbation are also affecting patient's functional outcome.  ?  ?  ?REHAB POTENTIAL: Fair ?  ?CLINICAL DECISION MAKING: Stable/uncomplicated ?  ?EVALUATION COMPLEXITY: Low ?  ?  ?GOALS: ?Goals reviewed with patient? Yes ?  ?SHORT TERM GOALS: Target date: 01/30/2022 ?  ?Patient will be I with initial HEP in order to progress with therapy. ?Baseline: HEP provided at evaluation ?Goal status: INITIAL ?  ?2.  Patient will report pain level no more than 6/10 with activity in order to reduce functional limitations. ?Baseline: patient reports pain up to 11/10 ?Goal status: INITIAL ?  ?LONG TERM GOALS: Target date: 02/27/2022 ?  ?Patient will be I with final HEP to maintain progress from PT. ?Baseline: HEP provided at  evaluation ?Goal status: INITIAL ?  ?2.  Patient will report Modified ODI </= 20/50 in order to indicate improved functional ability ?Baseline: Modified ODI 33/50 ?Goal status: INITIAL ?  ?3.  Patient will be able to walk >/= 45 minutes in order to walk

## 2022-01-23 ENCOUNTER — Ambulatory Visit: Payer: Medicaid Other | Attending: Physician Assistant

## 2022-01-23 DIAGNOSIS — G8929 Other chronic pain: Secondary | ICD-10-CM | POA: Diagnosis present

## 2022-01-23 DIAGNOSIS — M545 Low back pain, unspecified: Secondary | ICD-10-CM | POA: Insufficient documentation

## 2022-01-23 DIAGNOSIS — M6281 Muscle weakness (generalized): Secondary | ICD-10-CM | POA: Diagnosis present

## 2022-01-24 ENCOUNTER — Encounter: Payer: Medicaid Other | Admitting: Physical Therapy

## 2022-01-28 ENCOUNTER — Other Ambulatory Visit: Payer: Self-pay | Admitting: Internal Medicine

## 2022-01-29 NOTE — Telephone Encounter (Signed)
Requested Prescriptions  ?Pending Prescriptions Disp Refills  ?? TRULICITY 1.5 JS/2.8BT SOPN [Pharmacy Med Name: Trulicity 1.5 DV/7.6 mL subcutaneous pen injector] 2 mL 2  ?  Sig: INJECT 1.'5mg'$  into THE SKIN Once weekly  ?  ? Endocrinology:  Diabetes - GLP-1 Receptor Agonists Passed - 01/28/2022  1:41 PM  ?  ?  Passed - HBA1C is between 0 and 7.9 and within 180 days  ?  Hemoglobin A1C  ?Date Value Ref Range Status  ?12/27/2021 6.4 (A) 4.0 - 5.6 % Final  ? ?HbA1c, POC (prediabetic range)  ?Date Value Ref Range Status  ?08/24/2018 6.1 5.7 - 6.4 % Final  ? ?HbA1c, POC (controlled diabetic range)  ?Date Value Ref Range Status  ?08/28/2021 5.8 0.0 - 7.0 % Final  ?   ?  ?  Passed - Valid encounter within last 6 months  ?  Recent Outpatient Visits   ?      ? 1 month ago Diabetes mellitus type 2 in obese [name])  ? [name], MD  ? 5 months ago Type 2 diabetes mellitus without complication, without long-term current use of insulin ([name])  ? [name], MD  ? 9 months ago Type 2 diabetes mellitus without complication, without long-term current use of insulin ([name])  ? [name], MD  ? 10 months ago Gastroesophageal reflux disease without esophagitis  ? [name], MD  ? 1 year ago Type 2 diabetes mellitus without complication, without long-term current use of insulin ([name])  ? [name], RPH-CPP  ?  ?  ?Future Appointments   ?        ? In 2 weeks Lorine Bears, NP [place]  ? In 3 months Ladell Pier, MD [place]  ?  ? ?  ?  ?  ? ?

## 2022-01-30 DIAGNOSIS — E119 Type 2 diabetes mellitus without complications: Secondary | ICD-10-CM | POA: Diagnosis not present

## 2022-01-30 DIAGNOSIS — H35413 Lattice degeneration of retina, bilateral: Secondary | ICD-10-CM | POA: Diagnosis not present

## 2022-01-30 DIAGNOSIS — H35033 Hypertensive retinopathy, bilateral: Secondary | ICD-10-CM | POA: Diagnosis not present

## 2022-01-30 DIAGNOSIS — H2513 Age-related nuclear cataract, bilateral: Secondary | ICD-10-CM | POA: Diagnosis not present

## 2022-01-30 DIAGNOSIS — H0102B Squamous blepharitis left eye, upper and lower eyelids: Secondary | ICD-10-CM | POA: Diagnosis not present

## 2022-01-30 DIAGNOSIS — H0102A Squamous blepharitis right eye, upper and lower eyelids: Secondary | ICD-10-CM | POA: Diagnosis not present

## 2022-01-31 ENCOUNTER — Ambulatory Visit: Payer: Medicaid Other | Admitting: Physical Therapy

## 2022-02-07 ENCOUNTER — Ambulatory Visit: Payer: Medicaid Other

## 2022-02-07 NOTE — Therapy (Incomplete)
?OUTPATIENT PHYSICAL THERAPY TREATMENT NOTE ? ? ?Patient Name: [name] ?MRN: [id number] ?DOB:1961-07-04, 60 y.o., female ?Today's Date: 02/07/2022 ? ?PCP: Ladell Pier, MD ?REFERRING PROVIDER: Persons, Bevely Palmer, [place] ? ? ? ? ? ?Past Medical History:  ?Diagnosis Date  ? Anxiety   ? Arthritis   ? Depression   ? GERD (gastroesophageal reflux disease)   ? Hyperlipidemia   ? Hypertension   ? Migraines   ? Thyroid disease   ? ?Past Surgical History:  ?Procedure Laterality Date  ? BREAST CYST EXCISION Right 1980  ? BREAST CYST EXCISION Left 1985  ? NO PAST SURGERIES    ? ?Patient Active Problem List  ? Diagnosis Date Noted  ? Influenza vaccine needed 10/31/2020  ? Need for diphtheria-tetanus-pertussis (Tdap) vaccine 10/31/2020  ? Hyperlipidemia associated with type 2 diabetes mellitus ([place]) 10/31/2020  ? Overweight (BMI 25.0-29.9) 10/31/2020  ? Type 2 diabetes mellitus without complication, without long-term current use of insulin ([place]) 07/31/2020  ? OSA (obstructive sleep apnea) 02/02/2020  ? Spondylosis without myelopathy or radiculopathy, lumbar region 09/17/2017  ? Essential hypertension 09/05/2017  ? Chronic bilateral low back pain with left-sided sciatica 09/05/2017  ? HTN (hypertension) 05/05/2017  ? Hypothyroidism 02/24/2017  ? Mixed hyperlipidemia 02/24/2017  ? 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  ? Sleep related headaches 01/23/2017  ? Snoring 01/23/2017  ? 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  ? Chronic headaches 10/30/2016  ? Obesity (BMI 30.0-34.9) 05/24/2016  ? PMB (postmenopausal bleeding) 05/24/2016  ? Back pain of lumbar region with sciatica 09/28/2015  ? ?PCP: Ladell Pier, MD ?  ?REFERRING PROVIDER: Persons, Bevely Palmer, [place] ? ?THERAPY DIAG:  ?No diagnosis found. ? ?PERTINENT HISTORY: DM  ? ?PRECAUTIONS: None ? ?SUBJECTIVE: *** ? ?PAIN:  ?Are you having pain?  ?Yes: NPRS scale: 4/10 (patient reports 11/10 at worse) ?Pain location: Lower back, bilateral ?Pain description:  Tightening ?Aggravating factors: Standing, walking, sitting extended periods ?Relieving factors: Medication ? ? ? ? ? ?OBJECTIVE:  ?*Unless otherwise noted, objective information collected previously* ? ?DIAGNOSTIC FINDINGS:  ?Lumbar X-ray 12/21/2021: lumbar spine demonstrate well-maintained alignment very similar x-rays to previous she does have some calcification within the aorta but no different than her previous x-rays.  Good spacing no listhesis that I can appreciate minor spurring of the endplates. ?  ?PATIENT SURVEYS:  ?Modified Oswestry 33/50  ?  ?SCREENING FOR RED FLAGS: ?Negative ?  ?COGNITION: ?Overall cognitive status: Within functional limits for tasks assessed               ?           ?SENSATION: ?WFL ?  ?MUSCLE LENGTH: ?Hamstring, hip flexor, and quad flexibility limitation ?  ?POSTURE:  ?Rounded shoulder and forward head posture,  ?  ?PALPATION: ?Mildly tender to palpation bilateral paraspinals ?  ?LUMBAR ROM:  ?  ?Active  A/ROM  ?01/02/2022  ?Flexion 75%  ?Extension 75%  ?Right lateral flexion 75%  ?Left lateral flexion 75%  ?Right rotation 75%  ?Left rotation 75%  ?Patient denies any change in symptoms with active motion ?  ?LE ROM: ?                      HIP PROM grossly WFL and non-painful ?  ?LE MMT: ?  ?MMT Right ?01/02/2022 Left ?01/02/2022  ?Core (DLLT) 3  ?Hip flexion 4 4  ?Hip extension 3 3-  ?Hip abduction 3 3-  ?Knee flexion 5 5  ?Knee extension 5 5  ?  ?  LUMBAR SPECIAL TESTS:  ?Lumbar radicular testing negative ?  ?GAIT: ?Assistive device utilized: None ?Level of assistance: Complete [place] ?Comments: decreased gait speed, compensated trendelenburg bilaterally ?  ?  ?TODAY'S TREATMENT  ? ?[facility] Adult PT Treatment:                                                DATE: 02/07/2022 ?Therapeutic Exercise: ?*** ?Manual Therapy: ?*** ?Neuromuscular re-ed: ?*** ?Therapeutic Activity: ?*** ?Modalities: ?*** ?Self Care: ?*** ? ? ?[place] Adult PT Treatment:                                                 DATE: 01/23/2022 ?Therapeutic Exercise: ?NuStep level 4 x5 minutes while collecting subjective information ?Supine 90/90 abdominal isometric against handhold resistance 3x30sec ?DKTC 3x30sec ?Side knee plank x4 to failure BIL ?Standing Cybex hip abduction with 17.5# cable 2x10 BIL ?Standing Cybex hip extension with 17.5# cable 2x10 BIL ?Standing Cybex hip flexion with 17.5# cable 2x10 ?Standing abdominal press-down with 20# cable 2x10 ?Standing dead lift with 20# cable 2x10 ?Manual Therapy: ?N/A ?Neuromuscular re-ed: ?N/A ?Therapeutic Activity: ?N/A ?Modalities: ?N/A ?Self Care: ?N/A ? ? ?[place] Adult PT Treatment:                                                DATE: 01/09/22 ?Therapeutic Exercise: ?Nustep L5 x 6 min ?Sit-stand x 10 ?Bridge 10 x 2  ?S/L clam 10 x 2 red each ?Bridge with red band ER 10 x 2  ?SLR 10 x 2 cues for neutral spine  ?Hip abct 10 x 2 each ?SKTC ?LTR ?Hip flexor stretch EOM bilat  ?Seated hamstring stretch bilat ? ? ?  ?PATIENT EDUCATION:  ?Education details: Exam findings, POC, HEP ?Person educated: Patient ?Education method: Explanation, Demonstration, Tactile cues, Verbal cues, and Handouts ?Education comprehension: verbalized understanding, returned demonstration, verbal cues required, tactile cues required, and needs further education ?  ?HOME EXERCISE PROGRAM: ?Access Code: [email or web address] ?URL: [email or web address] ?Date: 01/09/2022 ?Prepared by: Hessie Diener ? ?Exercises ?Supine Piriformis Stretch with Foot on Ground - 2 x daily - 3 reps - 20 seconds hold ?Supine Lower Trunk Rotation - 2 x daily - 5 reps - 5 seconds hold ?Seated Hamstring Stretch - 2 x daily - 3 reps - 20 seconds hold ?Bridge - 1 x daily - 2 sets - 10 reps - 3 seconds hold ?Straight Leg Raise - 1 x daily - 2 sets - 10 reps ?Clam with Resistance - 1 x daily - 2 sets - 10 reps ?Modified Thomas Stretch - 1 x daily - 7 x weekly - 1 sets - 3 reps - 30 hold ? ? ?  ?  ?ASSESSMENT: ?CLINICAL IMPRESSION: ?*** ?  ?   ?OBJECTIVE IMPAIRMENTS Abnormal gait, decreased activity tolerance, decreased ROM, decreased strength, impaired flexibility, postural dysfunction, and pain.  ?  ?ACTIVITY LIMITATIONS community activity, occupation, and shopping.  ?  ?PERSONAL FACTORS Fitness, Past/current experiences, and Time since onset of injury/illness/exacerbation are also affecting patient's functional outcome.  ?  ?  ?REHAB POTENTIAL: Fair ?  ?CLINICAL DECISION MAKING: Stable/uncomplicated ?  ?  EVALUATION COMPLEXITY: Low ?  ?  ?GOALS: ?Goals reviewed with patient? Yes ?  ?SHORT TERM GOALS: Target date: 01/30/2022 ?  ?Patient will be I with initial HEP in order to progress with therapy. ?Baseline: HEP provided at evaluation ?Goal status: INITIAL ?  ?2.  Patient will report pain level no more than 6/10 with activity in order to reduce functional limitations. ?Baseline: patient reports pain up to 11/10 ?Goal status: INITIAL ?  ?LONG TERM GOALS: Target date: 02/27/2022 ?  ?Patient will be I with final HEP to maintain progress from PT. ?Baseline: HEP provided at evaluation ?Goal status: INITIAL ?  ?2.  Patient will report Modified ODI </= 20/50 in order to indicate improved functional ability ?Baseline: Modified ODI 33/50 ?Goal status: INITIAL ?  ?3.  Patient will be able to walk >/= 45 minutes in order to walk to grocery store. ?Baseline: patient reports increased low back pain with walking 15 minutes and need to stop and rest ?Goal status: INITIAL ?  ?4.  Patient will demonstrate core and hip strength grossly >/= 4-/5 MMT in order to improve standing and walking tolerance ?Baseline: grossly 3/5 MMT ?Goal status: INITIAL ?  ?  ?PLAN: ?PT FREQUENCY: 1-2x/week ?  ?PT DURATION: 8 weeks ?  ?PLANNED INTERVENTIONS: Therapeutic exercises, Therapeutic activity, Neuromuscular re-education, Balance training, Gait training, Patient/Family education, Joint manipulation, Joint mobilization, Aquatic Therapy, Dry Needling, Electrical stimulation, Spinal  manipulation, Spinal mobilization, Cryotherapy, Moist heat, and Manual therapy. ?  ?PLAN FOR NEXT SESSION: Review HEP and progress PRN, stretching for hip flexor/quad/hamstring/lumbar, progress strengthening for hip and core, in

## 2022-02-13 ENCOUNTER — Ambulatory Visit: Payer: Medicaid Other | Admitting: Physical Therapy

## 2022-02-13 ENCOUNTER — Encounter: Payer: Self-pay | Admitting: Physical Therapy

## 2022-02-13 DIAGNOSIS — M6281 Muscle weakness (generalized): Secondary | ICD-10-CM

## 2022-02-13 DIAGNOSIS — M545 Low back pain, unspecified: Secondary | ICD-10-CM | POA: Diagnosis not present

## 2022-02-13 DIAGNOSIS — G8929 Other chronic pain: Secondary | ICD-10-CM

## 2022-02-13 NOTE — Therapy (Signed)
?OUTPATIENT PHYSICAL THERAPY TREATMENT NOTE ? ? ?Patient Name: [name] ?MRN: [id number] ?DOB:08-Dec-1960, 61 y.o., female ?Today's Date: 02/13/2022 ? ?PCP: Ladell Pier, MD ?REFERRING PROVIDER: Persons, Bevely Palmer, PA ? ? PT End of Session - 02/13/22 1343   ? ? Visit Number 4   ? Number of Visits 8   ? Date for PT Re-Evaluation 02/27/22   ? Authorization Type [employer]   ? Authorization - Visit Number 4   ? Authorization - Number of Visits 27   ? PT Start Time 1340   ? PT Stop Time 1420   ? PT Time Calculation (min) 40 min   ? ?  ?  ? ?  ? ? ? ?Past Medical History:  ?Diagnosis Date  ? Anxiety   ? Arthritis   ? Depression   ? GERD (gastroesophageal reflux disease)   ? Hyperlipidemia   ? Hypertension   ? Migraines   ? Thyroid disease   ? ?Past Surgical History:  ?Procedure Laterality Date  ? BREAST CYST EXCISION Right 1980  ? BREAST CYST EXCISION Left 1985  ? NO PAST SURGERIES    ? ?Patient Active Problem List  ? Diagnosis Date Noted  ? Influenza vaccine needed 10/31/2020  ? Need for diphtheria-tetanus-pertussis (Tdap) vaccine 10/31/2020  ? Hyperlipidemia associated with type 2 diabetes mellitus ([place]) 10/31/2020  ? Overweight (BMI 25.0-29.9) 10/31/2020  ? Type 2 diabetes mellitus without complication, without long-term current use of insulin ([place]) 07/31/2020  ? OSA (obstructive sleep apnea) 02/02/2020  ? Spondylosis without myelopathy or radiculopathy, lumbar region 09/17/2017  ? Essential hypertension 09/05/2017  ? Chronic bilateral low back pain with left-sided sciatica 09/05/2017  ? HTN (hypertension) 05/05/2017  ? Hypothyroidism 02/24/2017  ? Mixed hyperlipidemia 02/24/2017  ? 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  ? Sleep related headaches 01/23/2017  ? Snoring 01/23/2017  ? 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  ? Chronic headaches 10/30/2016  ? Obesity (BMI 30.0-34.9) 05/24/2016  ? PMB (postmenopausal bleeding) 05/24/2016  ? Back pain of lumbar region with sciatica 09/28/2015  ? ?PCP: Ladell Pier, MD ?  ?REFERRING PROVIDER: Persons, Bevely Palmer, [place] ? ?THERAPY DIAG:  ?Chronic bilateral low back pain, unspecified whether sciatica present ? ?Muscle weakness (generalized) ? ?PERTINENT HISTORY: DM  ? ?PRECAUTIONS: None ? ?SUBJECTIVE: Pt report 5/10 LBP today. I just stay home and sometimes go to the store. I have been doing the exercises every other day. I noticed a couple of weeks ago that I could walk in [employer] for an hour and did not have any pain.  ? ?PAIN:  ?Are you having pain?  ?Yes: NPRS scale: 5/10  ?Pain location: Lower back, bilateral ?Pain description: Tightening ?Aggravating factors: Standing, walking, sitting extended periods ?Relieving factors: Medication ? ? ? ? ? ?OBJECTIVE:  ?*Unless otherwise noted, objective information collected previously* ? ?DIAGNOSTIC FINDINGS:  ?Lumbar X-ray 12/21/2021: lumbar spine demonstrate well-maintained alignment very similar x-rays to previous she does have some calcification within the aorta but no different than her previous x-rays.  Good spacing no listhesis that I can appreciate minor spurring of the endplates. ?  ?PATIENT SURVEYS:  ?Modified Oswestry 33/50  ?  ?SCREENING FOR RED FLAGS: ?Negative ?  ?COGNITION: ?Overall cognitive status: Within functional limits for tasks assessed               ?           ?SENSATION: ?WFL ?  ?MUSCLE LENGTH: ?Hamstring, hip flexor, and quad flexibility limitation ?  ?  POSTURE:  ?Rounded shoulder and forward head posture,  ?  ?PALPATION: ?Mildly tender to palpation bilateral paraspinals ?  ?LUMBAR ROM:  ?  ?Active  A/ROM  ?01/02/2022  ?Flexion 75%  ?Extension 75%  ?Right lateral flexion 75%  ?Left lateral flexion 75%  ?Right rotation 75%  ?Left rotation 75%  ?Patient denies any change in symptoms with active motion ?  ?LE ROM: ?                      HIP PROM grossly WFL and non-painful ?  ?LE MMT: ?  ?MMT Right ?01/02/2022 Left ?01/02/2022 Right  ?02/13/22 Left ?02/13/22  ?Core (DLLT) 3    ?Hip flexion 4 4    ?Hip extension 3  3-    ?Hip abduction 3 3- 4/5 4-  ?Knee flexion 5 5    ?Knee extension 5 5    ?  ?LUMBAR SPECIAL TESTS:  ?Lumbar radicular testing negative ?  ?GAIT: ?Assistive device utilized: None ?Level of assistance: Complete Independence ?Comments: decreased gait speed, compensated trendelenburg bilaterally ?  ?  ?TODAY'S TREATMENT  ?[facility] Adult PT Treatment:                                                DATE: 02/13/22 ?Therapeutic Exercise: ?Nustep L5 x 7 min ?Supine 90/90 abdominal isometric against handhold resistance 3x30sec ?Side knee plank x4 to failure BIL ?SKTC ?Side hip abduction 10 x 2  ?Standing Cybex hip abduction with 17.5# cable 2x10 BIL- mod cues  ?Leg press 40# 10 x 2  ? ? ? ?[facility] Adult PT Treatment:                                                DATE: 01/23/2022 ?Therapeutic Exercise: ?NuStep level 4 x5 minutes while collecting subjective information ?Supine 90/90 abdominal isometric against handhold resistance 3x30sec ?Side knee plank x4 to failure BIL ?Double Knee to chest 3 x 30 sec ?Standing Cybex hip abduction with 17.5# cable 2x10 BIL ?Standing Cybex hip extension with 17.5# cable 2x10 BIL ?Standing Cybex hip flexion with 17.5# cable 2x10 ?Standing abdominal press-down with 20# cable 2x10 ?Standing dead lift with 20# cable 2x10 ? ? ? ? ?[facility] Adult PT Treatment:                                                DATE: 01/09/22 ?Therapeutic Exercise: ?Nustep L5 x 6 min ?Sit-stand x 10 ?Bridge 10 x 2  ?S/L clam 10 x 2 red each ?Bridge with red band ER 10 x 2  ?SLR 10 x 2 cues for neutral spine  ?Hip abct 10 x 2 each ?SKTC ?LTR ?Hip flexor stretch EOM bilat  ?Seated hamstring stretch bilat ? ? ?INITIAL TREATMENT ?Piriformis stretch 2 x 20 sec each ?LTR 3 x 5 sec each ?Seated hamstring stretch 2 x 20 sec each ?Bridge x 10 with 3 sec hold ?SLR x 10 each ?Side clamshell with red x 10 each ?  ?PATIENT EDUCATION:  ?Education details: Exam findings, POC, HEP ?Person educated: Patient ?Education method: Explanation,  Demonstration,  Tactile cues, Verbal cues, and Handouts ?Education comprehension: verbalized understanding, returned demonstration, verbal cues required, tactile cues required, and needs further education ?  ?HOME EXERCISE PROGRAM: ?Access Code: [email or web address] ?URL: [email or web address] ?Date: 02/13/2022 ?Prepared by: Hessie Diener ? ?Exercises ?- Supine Piriformis Stretch with Foot on Ground  - 2 x daily - 3 reps - 20 seconds hold ?- Supine Lower Trunk Rotation  - 2 x daily - 5 reps - 5 seconds hold ?- Seated Hamstring Stretch  - 2 x daily - 3 reps - 20 seconds hold ?- Bridge  - 1 x daily - 2 sets - 10 reps - 3 seconds hold ?- Straight Leg Raise  - 1 x daily - 2 sets - 10 reps ?- Clam with Resistance  - 1 x daily - 2 sets - 10 reps ?- Modified Thomas Stretch  - 1 x daily - 7 x weekly - 1 sets - 3 reps - 30 hold ?- Supine Bilateral Isometric Hip Flexion  - 1 x daily - 7 x weekly - 1 sets - 3 reps - 30 hold ?- Side Plank on Knees  - 1 x daily - 7 x weekly - 1 sets - 5 reps - 10-30 hold ?- Sidelying Hip Abduction  - 1 x daily - 7 x weekly - 2-3 sets - 10 reps ? ? ?  ?  ?ASSESSMENT: ?CLINICAL IMPRESSION: ?Pt responded well to all interventions today, demonstrating good form and no increase in pain with progress core/ hip strengthening exercises. Her hip abduction strength has improved. She reports improved tolerance to walking on indoor surfaces. She will continue to benefit from skilled PT to address her primary impairments and return to her prior level of function with less limitation. ?  ?  ?OBJECTIVE IMPAIRMENTS Abnormal gait, decreased activity tolerance, decreased ROM, decreased strength, impaired flexibility, postural dysfunction, and pain.  ?  ?ACTIVITY LIMITATIONS community activity, occupation, and shopping.  ?  ?PERSONAL FACTORS Fitness, Past/current experiences, and Time since onset of injury/illness/exacerbation are also affecting patient's functional outcome.  ?  ?  ?REHAB POTENTIAL: Fair ?   ?CLINICAL DECISION MAKING: Stable/uncomplicated ?  ?EVALUATION COMPLEXITY: Low ?  ?  ?GOALS: ?Goals reviewed with patient? Yes ?  ?SHORT TERM GOALS: [place] date: 01/30/2022 ?  ?Patient will be I with initial HEP in or

## 2022-02-14 ENCOUNTER — Encounter: Payer: Self-pay | Admitting: Internal Medicine

## 2022-02-18 ENCOUNTER — Encounter: Payer: Self-pay | Admitting: Physical Medicine and Rehabilitation

## 2022-02-18 ENCOUNTER — Ambulatory Visit (INDEPENDENT_AMBULATORY_CARE_PROVIDER_SITE_OTHER): Payer: Medicaid Other | Admitting: Physical Medicine and Rehabilitation

## 2022-02-18 VITALS — BP 91/56 | HR 86

## 2022-02-18 DIAGNOSIS — M546 Pain in thoracic spine: Secondary | ICD-10-CM

## 2022-02-18 DIAGNOSIS — G8929 Other chronic pain: Secondary | ICD-10-CM | POA: Diagnosis not present

## 2022-02-18 DIAGNOSIS — M7918 Myalgia, other site: Secondary | ICD-10-CM | POA: Diagnosis not present

## 2022-02-18 DIAGNOSIS — M545 Low back pain, unspecified: Secondary | ICD-10-CM | POA: Diagnosis not present

## 2022-02-18 MED ORDER — CELECOXIB 200 MG PO CAPS: 200.0000 mg | ORAL_CAPSULE | Freq: Every day | ORAL | 0 refills | Status: DC

## 2022-02-18 NOTE — Progress Notes (Signed)
? ?[name] - 61 y.o. female MRN [id number]  Date of birth: Mar 29, 1961 ? ?Office Visit Note: ?Visit Date: 02/18/2022 ?PCP: Ladell Pier, MD ?Referred by: Ladell Pier, MD ? ?Subjective: ?Chief Complaint  ?Patient presents with  ? Lower Back - Pain  ? Middle Back - Pain  ? ?HPI: [name] is a 61 y.o. female who comes in today for evaluation of chronic bilateral lower back pain radiating up to thoracic back and shoulders. Patient reports pain has been ongoing intermittently for several years and is exacerbated by movement and activity. She describes pain as sore, throbbing and tight sensation, currently rates pain as 5 out of 10. Patient reports some relief of pain with home exercise regimen, rest and use of Tylenol as needed. Patient is currently attending formal physical therapy at [facility] and reports some relief of pain with these treatments. Patents lumbar MRI from 2018 exhibits minimal degenerative disc disease in the lumbar spine, no high-grade spinal canal stenosis, no neural impingement. Patient has history of bilateral L4-L5 radiofrequency ablation performed in our office in 2019, she reports minimal relief of pain with this procedure. Overall, patient reports she is slowly starting to feel better, however her pain continues to be aggravating on a daily basis. Patient denies focal weakness, numbness and tingling. Patient denies recent trauma or falls.  ? ? ?Oswestry Disability Index Score 46%: 20 to 30 (60%) severe disability: Pain remains the main problem in this group but activities of daily living are affected. These patients require a detailed investigation. ? ?Review of Systems  ?Musculoskeletal:  Positive for back pain and myalgias.  ?Neurological:  Negative for tingling, sensory change, focal weakness and weakness.  ?All other systems reviewed and are negative. Otherwise per HPI. ? ?Assessment & Plan: ?Visit Diagnoses:  ?  ICD-10-CM   ?1.  Chronic bilateral low back pain without sciatica  M54.50   ? G89.29   ?  ?2. Chronic bilateral thoracic back pain  M54.6   ? G89.29   ?  ?3. Myofascial pain syndrome  M79.18   ?  ?   ?Plan: Findings:  ?Chronic bilateral lower back pain radiating up to thoracic back and shoulders.  Patient continues to have severe pain despite good conservative therapy such as formal physical therapy, home exercise regimen, rest and use of medications.  Patient's clinical presentation and exam continue to be consistent with myofascial pain.  Her pain pattern is non dermatomal in nature. I also feel there could be a type of central sensitization syndrome such as fibromyalgia contributing to her pain. I did discuss medication management with patient today and did place prescription for Celebrex today. We believe the next step is to have patient continue with physical therapy and to start Celebrex. If her pain persists we did discuss possibility of obtaining new lumbar MRI imaging. I would like to see her back in approximately 4 weeks for re-evaluation. I also provided patient with information for massage therapy with [facility]. Patient states she will look into massage therapy, however she is concerned it will not be feasible due to financial issues. Patient encouraged to be active and to continue home exercise regimen. No red flag symptoms noted upon exam today.   ? ?Meds & Orders:  ?Meds ordered this encounter  ?Medications  ? celecoxib (CELEBREX) 200 MG capsule  ?  Sig: Take 1 capsule (200 mg total) by mouth daily.  ?  Dispense:  30 capsule  ?  Refill:  0  ?  Order Specific Question:   Supervising Provider  ?  AnswerMagnus Sinning [[id number]]  ? No orders of the defined types were placed in this encounter. ?  ?Follow-up: Return for 4 week follow up for re-evaluation.  ? ?Procedures: ?No procedures performed  ?   ? ?Clinical History: ?EXAM: ?MRI LUMBAR SPINE WITHOUT CONTRAST ?  ?TECHNIQUE: ?Multiplanar, multisequence MR imaging  of the lumbar spine was ?performed. No intravenous contrast was administered. ?  ?COMPARISON:  CT abdomen pelvis dated May 05, 2017; lumbar spine ?x-rays dated April 07, 2017. ?  ?FINDINGS: ?Segmentation:  Standard. ?  ?Alignment:  Physiologic. ?  ?Vertebrae:  No fracture, evidence of discitis, or bone lesion. ?  ?Conus medullaris: Extends to the L1 level and appears normal. ?  ?Paraspinal and other soft tissues: Negative. ?  ?Disc levels: ?  ?T12-L1:  Normal. ?  ?L1-L2:  Normal. ?  ?L2-L3:  Small left foraminal protrusion.  No neural impingement. ?  ?L3-L4: Small left foraminal disc protrusion. No neural impingement. ?  ?L4-L5:  Normal. ?  ?L5-S1:  Tiny disc bulge.  No neural impingement. ?  ?IMPRESSION: ?1. 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 No high-grade spinal canal or neuroforaminal ?stenosis. No neural impingement. ?  ?  ?Electronically Signed ?By: Titus Dubin M.D. ?On: 05/16/2017 11:07  ? ?She reports that she has never smoked. She has never used smokeless tobacco.  ?Recent Labs  ?  04/20/21 ?1015 08/28/21 ?9211 12/27/21 ?1550  ?HGBA1C 5.5 5.8 6.4*  ? ? ?Objective:  VS:  HT:    WT:   BMI:     BP: ?(!) 91/56  HR:86bpm  TEMP: ( )  RESP:  ?Physical Exam ?Vitals and nursing note reviewed.  ?HENT:  ?   Head: Normocephalic and atraumatic.  ?   Right Ear: External ear normal.  ?   Left Ear: External ear normal.  ?   Nose: Nose normal.  ?   Mouth/Throat:  ?   Mouth: Mucous membranes are moist.  ?Eyes:  ?   Extraocular Movements: Extraocular movements intact.  ?Cardiovascular:  ?   Rate and Rhythm: Normal rate.  ?   Pulses: Normal pulses.  ?Pulmonary:  ?   Effort: Pulmonary effort is normal.  ?Abdominal:  ?   General: Abdomen is flat. There is no distension.  ?Musculoskeletal:     ?   General: Tenderness present.  ?   Cervical back: Normal range of motion.  ?   Comments: No discomfort noted with flexion, extension and side-to-side rotation. Patient has good strength  in the upper extremities including 5 out of 5 strength in wrist extension, long finger flexion and APB.  There is no atrophy of the hands intrinsically. Tenderness noted upon palpation of bilateral thoracic paraspinal regions. Sensation intact bilaterally.   ?Skin: ?   General: Skin is warm and dry.  ?   Capillary Refill: Capillary refill takes less than 2 seconds.  ?Neurological:  ?   General: No focal deficit present.  ?   Mental Status: She is alert and oriented to person, place, and time.  ?Psychiatric:     ?   Mood and Affect: Mood normal.     ?   Behavior: Behavior normal.  ?  ?Ortho Exam ? ?Imaging: ?No results found. ? ?Past Medical/Family/Surgical/Social History: ?Medications & Allergies reviewed per EMR, new medications updated. ?Patient Active Problem List  ? Diagnosis Date Noted  ?  Influenza vaccine needed 10/31/2020  ? Need for diphtheria-tetanus-pertussis (Tdap) vaccine 10/31/2020  ? Hyperlipidemia associated with type 2 diabetes mellitus ([place]) 10/31/2020  ? Overweight (BMI 25.0-29.9) 10/31/2020  ? Type 2 diabetes mellitus without complication, without long-term current use of insulin ([place]) 07/31/2020  ? OSA (obstructive sleep apnea) 02/02/2020  ? Spondylosis without myelopathy or radiculopathy, lumbar region 09/17/2017  ? Essential hypertension 09/05/2017  ? Chronic bilateral low back pain with left-sided sciatica 09/05/2017  ? HTN (hypertension) 05/05/2017  ? Hypothyroidism 02/24/2017  ? Mixed hyperlipidemia 02/24/2017  ? 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  ? Sleep related headaches 01/23/2017  ? Snoring 01/23/2017  ? 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  ? Chronic headaches 10/30/2016  ? Obesity (BMI 30.0-34.9) 05/24/2016  ? PMB (postmenopausal bleeding) 05/24/2016  ? Back pain of lumbar region with sciatica 09/28/2015  ? ?Past Medical History:  ?Diagnosis Date  ? Anxiety   ? Arthritis   ? Depression   ? GERD (gastroesophageal reflux disease)   ? Hyperlipidemia   ? Hypertension   ? Migraines    ? Thyroid disease   ? ?Family History  ?Problem Relation Age of Onset  ? Headache Neg Hx   ? Colon cancer Neg Hx   ? Esophageal cancer Neg Hx   ? Rectal cancer Neg Hx   ? Stomach cancer Neg Hx   ? Breast cancer

## 2022-02-18 NOTE — Progress Notes (Signed)
Pt state lower back pain that travels up her back. Pt state walking, standing and laying down makes the pain worse. Pt state she take pain meds to help ease her pain. ? ?Numeric Pain Rating Scale and Functional Assessment ?Average Pain 10 ?Pain Right Now 5 ?My pain is burning, dull, and aching ?Pain is worse with: walking, bending, sitting, standing, some activites, and laying down ?Pain improves with: medication ? ? ?In the last MONTH (on 0-10 scale) has pain interfered with the following? ? ?1. General activity like being  able to carry out your everyday physical activities such as walking, climbing stairs, carrying groceries, or moving a chair?  ?Rating(5) ? ?2. Relation with others like being able to carry out your usual social activities and roles such as  activities at home, at work and in your community. ?Rating(6) ? ?3. Enjoyment of life such that you have  been bothered by emotional problems such as feeling anxious, depressed or irritable?  ?Rating(7) ? ?

## 2022-02-21 ENCOUNTER — Ambulatory Visit: Payer: Medicaid Other | Attending: Physician Assistant

## 2022-02-21 DIAGNOSIS — M545 Low back pain, unspecified: Secondary | ICD-10-CM | POA: Diagnosis present

## 2022-02-21 DIAGNOSIS — M6281 Muscle weakness (generalized): Secondary | ICD-10-CM | POA: Insufficient documentation

## 2022-02-21 DIAGNOSIS — G8929 Other chronic pain: Secondary | ICD-10-CM | POA: Diagnosis present

## 2022-02-21 NOTE — Therapy (Signed)
?OUTPATIENT PHYSICAL THERAPY TREATMENT NOTE/ RE-EVALUATION ? ? ?Patient Name: [name] ?MRN: [id number] ?DOB:05/16/1961, 61 y.o., female ?Today's Date: 02/21/2022 ? ?PCP: Ladell Pier, MD ?REFERRING PROVIDER: Persons, Bevely Palmer, PA ? ? ? ? ? ?Past Medical History:  ?Diagnosis Date  ? Anxiety   ? Arthritis   ? Depression   ? GERD (gastroesophageal reflux disease)   ? Hyperlipidemia   ? Hypertension   ? Migraines   ? Thyroid disease   ? ?Past Surgical History:  ?Procedure Laterality Date  ? BREAST CYST EXCISION Right 1980  ? BREAST CYST EXCISION Left 1985  ? NO PAST SURGERIES    ? ?Patient Active Problem List  ? Diagnosis Date Noted  ? Influenza vaccine needed 10/31/2020  ? Need for diphtheria-tetanus-pertussis (Tdap) vaccine 10/31/2020  ? Hyperlipidemia associated with type 2 diabetes mellitus ([place]) 10/31/2020  ? Overweight (BMI 25.0-29.9) 10/31/2020  ? Type 2 diabetes mellitus without complication, without long-term current use of insulin ([place]) 07/31/2020  ? OSA (obstructive sleep apnea) 02/02/2020  ? Spondylosis without myelopathy or radiculopathy, lumbar region 09/17/2017  ? Essential hypertension 09/05/2017  ? Chronic bilateral low back pain with left-sided sciatica 09/05/2017  ? HTN (hypertension) 05/05/2017  ? Hypothyroidism 02/24/2017  ? Mixed hyperlipidemia 02/24/2017  ? 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  ? Sleep related headaches 01/23/2017  ? Snoring 01/23/2017  ? 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  ? Chronic headaches 10/30/2016  ? Obesity (BMI 30.0-34.9) 05/24/2016  ? PMB (postmenopausal bleeding) 05/24/2016  ? Back pain of lumbar region with sciatica 09/28/2015  ? ?PCP: Ladell Pier, MD ?  ?REFERRING PROVIDER: Persons, Bevely Palmer, [place] ? ?THERAPY DIAG:  ?No diagnosis found. ? ?PERTINENT HISTORY: DM  ? ?PRECAUTIONS: None ? ?SUBJECTIVE: Pt reports her back has been feeling better, especially since being prescribed Celebrex last Tuesday. She reports doing her HEP daily.  ? ?PAIN:  ?Are  you having pain?  ?Yes: NPRS scale: 5/10  ?Pain location: Lower back, bilateral ?Pain description: Tightening ?Aggravating factors: Standing, walking, sitting extended periods ?Relieving factors: Medication ? ? ? ? ? ?OBJECTIVE:  ?*Unless otherwise noted, objective information collected previously* ? ?DIAGNOSTIC FINDINGS:  ?Lumbar X-ray 12/21/2021: lumbar spine demonstrate well-maintained alignment very similar x-rays to previous she does have some calcification within the aorta but no different than her previous x-rays.  Good spacing no listhesis that I can appreciate minor spurring of the endplates. ?  ?PATIENT SURVEYS:  ?Modified Oswestry 33/50, 66% ?02/21/2022: 24/50, 48% ?  ?SCREENING FOR RED FLAGS: ?Negative ?  ?COGNITION: ?Overall cognitive status: Within functional limits for tasks assessed               ?           ?SENSATION: ?WFL ?  ?MUSCLE LENGTH: ?Hamstring, hip flexor, and quad flexibility limitation ?  ?POSTURE:  ?Rounded shoulder and forward head posture,  ?  ?PALPATION: ?Mildly tender to palpation bilateral paraspinals ?  ?LUMBAR ROM:  ?  ?Active  A/ROM  ?01/02/2022 AROM ?02/21/2022  ?Flexion 75% 100%  ?Extension 75% 100%  ?Right lateral flexion 75% 100%  ?Left lateral flexion 75% 100%  ?Right rotation 75% 75%  ?Left rotation 75% 100%  ?Patient denies any change in symptoms with active motion ?  ?LE ROM: ?                      HIP PROM grossly WFL and non-painful ?  ?LE MMT: ?  ?MMT Right ?01/02/2022 Left ?01/02/2022 Right  ?  02/13/22 Left ?02/13/22 Right ?02/21/2022 Left ?02/21/2022  ?Core (DLLT) 3      ?Hip flexion 4 4   5/5 5/5  ?Hip extension 3 3-   3/5 3/5  ?Hip abduction 3 3- 4/5 4- 4-/5 4-/5  ?Knee flexion 5 5      ?Knee extension 5 5      ?  ?LUMBAR SPECIAL TESTS:  ?Lumbar radicular testing negative ?  ?GAIT: ?Assistive device utilized: None ?Level of assistance: Complete Independence ?Comments: decreased gait speed, compensated trendelenburg bilaterally ?  ?  ?TODAY'S TREATMENT  ? ?[facility] Adult PT Treatment:                                                 DATE: 02/21/2022 ?Therapeutic Exercise: ?Standard plank x5 to failure (roughly 5-sec each) ?Knee planks x4 to failure (roughly 20 seconds) ?Sidelying hip abduction with YTB x12 BIL ?Standing Cybex hip abduction with 17.5# cable 2x10 BIL ?Standing Cybex hip extension with 17.5# cable 2x10 ?Manual Therapy: ?N/A ?Neuromuscular re-ed: ?N/A ?Therapeutic Activity: ?Re-assessment of objective measures/ rehab goals with pt education on progress to date and POC update ?Re-administration of ODI with pt education ?Modalities: ?N/A ?Self Care: ?N/A ? ? ?[facility] Adult PT Treatment:                                                DATE: 02/13/22 ?Therapeutic Exercise: ?Nustep L5 x 7 min ?Supine 90/90 abdominal isometric against handhold resistance 3x30sec ?Side knee plank x4 to failure BIL ?SKTC ?Side hip abduction 10 x 2  ?Standing Cybex hip abduction with 17.5# cable 2x10 BIL- mod cues  ?Leg press 40# 10 x 2  ? ? ? ?[facility] Adult PT Treatment:                                                DATE: 01/23/2022 ?Therapeutic Exercise: ?NuStep level 4 x5 minutes while collecting subjective information ?Supine 90/90 abdominal isometric against handhold resistance 3x30sec ?Side knee plank x4 to failure BIL ?Double Knee to chest 3 x 30 sec ?Standing Cybex hip abduction with 17.5# cable 2x10 BIL ?Standing Cybex hip extension with 17.5# cable 2x10 BIL ?Standing Cybex hip flexion with 17.5# cable 2x10 ?Standing abdominal press-down with 20# cable 2x10 ?Standing dead lift with 20# cable 2x10 ? ? ? ?  ?PATIENT EDUCATION:  ?Education details: Exam findings, POC, HEP ?Person educated: Patient ?Education method: Explanation, Demonstration, Tactile cues, Verbal cues, and Handouts ?Education comprehension: verbalized understanding, returned demonstration, verbal cues required, tactile cues required, and needs further education ?  ?HOME EXERCISE PROGRAM: ?Access Code: [email or web address] ?URL:  [email or web address] ?Date: 02/13/2022 ?Prepared by: Hessie Diener ? ?Exercises ?- Supine Piriformis Stretch with Foot on Ground  - 2 x daily - 3 reps - 20 seconds hold ?- Supine Lower Trunk Rotation  - 2 x daily - 5 reps - 5 seconds hold ?- Seated Hamstring Stretch  - 2 x daily - 3 reps - 20 seconds hold ?- Bridge  - 1 x daily - 2 sets - 10 reps - 3 seconds hold ?- Straight Leg Raise  -  1 x daily - 2 sets - 10 reps ?- Clam with Resistance  - 1 x daily - 2 sets - 10 reps ?- Modified Thomas Stretch  - 1 x daily - 7 x weekly - 1 sets - 3 reps - 30 hold ?- Supine Bilateral Isometric Hip Flexion  - 1 x daily - 7 x weekly - 1 sets - 3 reps - 30 hold ?- Side Plank on Knees  - 1 x daily - 7 x weekly - 1 sets - 5 reps - 10-30 hold ?- Sidelying Hip Abduction  - 1 x daily - 7 x weekly - 2-3 sets - 10 reps ? ? ?  ?  ?ASSESSMENT: ?CLINICAL IMPRESSION: ?Upon re-assessment of objective measures and ODI, pt has made excellent progress toward her rehab goals. She demonstrates improved hip strength, improved lumbar ROM, improved functional walking ability, and improved ODI score. She also tolerated progressed core and hip strengthening today, without pain increase and with good form. However, she required regular verbal and tactile cues for form when performing hip abduction exercises to prevent hip flexor engagement. She will continue to benefit from skilled PT to address her primary impairments and return to her prior level of function with less limitation. ?  ?  ?OBJECTIVE IMPAIRMENTS Abnormal gait, decreased activity tolerance, decreased ROM, decreased strength, impaired flexibility, postural dysfunction, and pain.  ?  ?ACTIVITY LIMITATIONS community activity, occupation, and shopping.  ?  ?PERSONAL FACTORS Fitness, Past/current experiences, and Time since onset of injury/illness/exacerbation are also affecting patient's functional outcome.  ?  ?  ?REHAB POTENTIAL: Fair ?  ?CLINICAL DECISION MAKING:  Stable/uncomplicated ?  ?EVALUATION COMPLEXITY: Low ?  ?  ?GOALS: ?Goals reviewed with patient? Yes ?  ?SHORT TERM GOALS: Target date: 01/30/2022 ?  ?Patient will be I with initial HEP in order to progress with therapy. ?Baseline: HEP provided

## 2022-03-04 ENCOUNTER — Other Ambulatory Visit: Payer: Self-pay | Admitting: Internal Medicine

## 2022-03-04 DIAGNOSIS — I10 Essential (primary) hypertension: Secondary | ICD-10-CM

## 2022-03-06 NOTE — Therapy (Signed)
OUTPATIENT PHYSICAL THERAPY TREATMENT NOTE/ RE-EVALUATION   Patient Name: [name] MRN: [id number] DOB:1961/02/26, 61 y.o., female Today's Date: 03/07/2022  PCP: Ladell Pier, MD REFERRING PROVIDER: Ladell Pier, MD   PT End of Session - 03/07/22 1616     Visit Number 6    Number of Visits 11    Date for PT Re-Evaluation 04/11/22    Authorization Type [employer]    Authorization - Visit Number 6    Authorization - Number of Visits 29    PT Start Time 6948    PT Stop Time 5462    PT Time Calculation (min) 42 min    Activity Tolerance Patient tolerated treatment well    Behavior During Therapy WFL for tasks assessed/performed               Past Medical History:  Diagnosis Date   Anxiety    Arthritis    Depression    GERD (gastroesophageal reflux disease)    Hyperlipidemia    Hypertension    Migraines    Thyroid disease    Past Surgical History:  Procedure Laterality Date   BREAST CYST EXCISION Right 1980   BREAST CYST EXCISION Left 1985   NO PAST SURGERIES     Patient Active Problem List   Diagnosis Date Noted   Influenza vaccine needed 10/31/2020   Need for diphtheria-tetanus-pertussis (Tdap) vaccine 10/31/2020   Hyperlipidemia associated with type 2 diabetes mellitus ([place]) 10/31/2020   Overweight (BMI 25.0-29.9) 10/31/2020   Type 2 diabetes mellitus without complication, without long-term current use of insulin ([place]) 07/31/2020   OSA (obstructive sleep apnea) 02/02/2020   Spondylosis without myelopathy or radiculopathy, lumbar region 09/17/2017   Essential hypertension 09/05/2017   Chronic bilateral low back pain with left-sided sciatica 09/05/2017   HTN (hypertension) 05/05/2017   Hypothyroidism 02/24/2017   Mixed hyperlipidemia 02/24/2017   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   Sleep related headaches 01/23/2017   Snoring 01/23/2017   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   Chronic headaches 10/30/2016   Obesity (BMI 30.0-34.9)  05/24/2016   PMB (postmenopausal bleeding) 05/24/2016   Back pain of lumbar region with sciatica 09/28/2015   PCP: Ladell Pier, MD   REFERRING PROVIDER: Persons, Bevely Palmer, PA  THERAPY DIAG:  Chronic bilateral low back pain, unspecified whether sciatica present  Muscle weakness (generalized)  PERTINENT HISTORY: DM   PRECAUTIONS: None  SUBJECTIVE: Pt reports 0/10 pain currently, which she attributes to taking Celebrex prior to coming in. She reports her worst pain the past week has been an 8/10.   PAIN:  Are you having pain?  Yes: NPRS scale: 5/10  Pain location: Lower back, bilateral Pain description: Tightening Aggravating factors: Standing, walking, sitting extended periods Relieving factors: Medication      OBJECTIVE:  *Unless otherwise noted, objective information collected previously*  DIAGNOSTIC FINDINGS:  Lumbar X-ray 12/21/2021: lumbar spine demonstrate well-maintained alignment very similar x-rays to previous she does have some calcification within the aorta but no different than her previous x-rays.  Good spacing no listhesis that I can appreciate minor spurring of the endplates.   PATIENT SURVEYS:  Modified Oswestry 33/50, 66% 02/21/2022: 24/50, 48%   SCREENING FOR RED FLAGS: Negative   COGNITION: Overall cognitive status: Within functional limits for tasks assessed                          SENSATION: WFL   MUSCLE LENGTH: Hamstring, hip flexor,  and quad flexibility limitation   POSTURE:  Rounded shoulder and forward head posture,    PALPATION: Mildly tender to palpation bilateral paraspinals   LUMBAR ROM:    Active  A/ROM  01/02/2022 AROM 02/21/2022  Flexion 75% 100%  Extension 75% 100%  Right lateral flexion 75% 100%  Left lateral flexion 75% 100%  Right rotation 75% 75%  Left rotation 75% 100%  Patient denies any change in symptoms with active motion   LE ROM:                       HIP PROM grossly WFL and non-painful   LE MMT:    MMT Right 01/02/2022 Left 01/02/2022 Right  02/13/22 Left 02/13/22 Right 02/21/2022 Left 02/21/2022  Core (DLLT) 3      Hip flexion 4 4   5/5 5/5  Hip extension 3 3-   3/5 3/5  Hip abduction 3 3- 4/5 4- 4-/5 4-/5  Knee flexion 5 5      Knee extension 5 5        LUMBAR SPECIAL TESTS:  Lumbar radicular testing negative   GAIT: Assistive device utilized: None Level of assistance: Complete Independence Comments: decreased gait speed, compensated trendelenburg bilaterally     TODAY'S TREATMENT   [facility] Adult PT Treatment:                                                DATE: 03/07/2022 Therapeutic Exercise: Supine 90/90 abdominal isometric with handhold resistance 4x30sec DKTC 2x30sec Thomas stretch x18mn BIL Side knee plank with clams 2x10 BIL Seated Russian twist 3x20 Standing hip extension with 7# cable to ankle at Free Motion 2x10 BIL Standing hip abduction with 7# cable to ankle at Free Motion 2x10 BIL Manual Therapy: N/A Neuromuscular re-ed: N/A Therapeutic Activity: N/A Modalities: N/A Self Care: N/A   [facility]                                                [facility]: [facility] Therapeutic Exercise: Standard plank x5 to failure (roughly 5-sec each) Knee planks x4 to failure (roughly 20 seconds) Sidelying hip abduction with YTB x12 BIL Standing Cybex hip abduction with 17.5# cable 2x10 BIL Standing Cybex hip extension with 17.5# cable 2x10 Manual Therapy: N/A Neuromuscular re-ed: N/A Therapeutic Activity: Re-assessment of objective measures/ rehab goals with pt education on progress to date and POC update Re-administration of ODI with pt education Modalities: N/A Self Care: N/A   [facility] Adult PT Treatment:                                                DATE: 02/13/22 Therapeutic Exercise: Nustep L5 x 7 min Supine 90/90 abdominal isometric against handhold resistance 3x30sec Side knee plank x4 to failure BIL SKTC Side hip abduction 10 x 2   Standing Cybex hip abduction with 17.5# cable 2x10 BIL- mod cues  Leg press 40# 10 x 2        PATIENT EDUCATION:  Education details: Exam findings, POC, HEP Person educated: Patient Education method: Explanation,  Demonstration, Tactile cues, Verbal cues, and Handouts Education comprehension: verbalized understanding, returned demonstration, verbal cues required, tactile cues required, and needs further education   HOME EXERCISE PROGRAM: Access Code: [email or web address] URL: [email or web address] Date: 02/13/2022 Prepared by: Hessie Diener  Exercises - Supine Piriformis Stretch with Foot on Ground  - 2 x daily - 3 reps - 20 seconds hold - Supine Lower Trunk Rotation  - 2 x daily - 5 reps - 5 seconds hold - Seated Hamstring Stretch  - 2 x daily - 3 reps - 20 seconds hold - Bridge  - 1 x daily - 2 sets - 10 reps - 3 seconds hold - Straight Leg Raise  - 1 x daily - 2 sets - 10 reps - Clam with Resistance  - 1 x daily - 2 sets - 10 reps - Modified Thomas Stretch  - 1 x daily - 7 x weekly - 1 sets - 3 reps - 30 hold - Supine Bilateral Isometric Hip Flexion  - 1 x daily - 7 x weekly - 1 sets - 3 reps - 30 hold - Side Plank on Knees  - 1 x daily - 7 x weekly - 1 sets - 5 reps - 10-30 hold - Sidelying Hip Abduction  - 1 x daily - 7 x weekly - 2-3 sets - 10 reps       ASSESSMENT: CLINICAL IMPRESSION: Pt responded well to all interventions, demonstrating improved form with verbal and tactile cuing and decreased pain compared to previous sessions. Due to pt reporting feeling slightly lightheaded toward the end of the session, she was provided a seated rest, ginger ale, and cookies. Following this break, her symptoms returned to baseline, and she was able to complete the session without complication. She will continue to benefit from skilled PT to address her primary impairments and return to her prior level of function with less limitation.     OBJECTIVE IMPAIRMENTS Abnormal gait,  decreased activity tolerance, decreased ROM, decreased strength, impaired flexibility, postural dysfunction, and pain.    ACTIVITY LIMITATIONS community activity, occupation, and shopping.    PERSONAL FACTORS Fitness, Past/current experiences, and Time since onset of injury/illness/exacerbation are also affecting patient's functional outcome.          GOALS: Goals reviewed with patient? Yes   SHORT TERM GOALS: Target date: 01/30/2022   Patient will be I with initial HEP in order to progress with therapy. Baseline: HEP provided at evaluation 02/21/2022: Pt reports daily adherence to her HEP Goal status: ACHIEVED   2.  Patient will report pain level no more than 6/10 with activity in order to reduce functional limitations. Baseline: patient reports pain up to 11/10 02/21/2022: 5/10 Goal status: ACHIEVED   LONG TERM GOALS: Target date: 02/27/2022   Patient will be I with final HEP to maintain progress from PT. Baseline: HEP provided at evaluation 02/21/2022: Pt reports daily adherence to an updated HEP Goal status: ACHIEVED   2.  Patient will report Modified ODI </= 20/50 in order to indicate improved functional ability Baseline: Modified ODI 33/50 02/21/2022: 24/50 Goal status: PROGRESSING   3.  Patient will be able to walk >/= 45 minutes in order to walk to grocery store. Baseline: patient reports increased low back pain with walking 15 minutes and need to stop and rest 02/21/2022: Pt reports being able to walk about 30 minutes before needing a seated rest Goal status: PROGRESSING   4.  Patient will demonstrate core and hip strength grossly >/= 4-/5 MMT in  order to improve standing and walking tolerance Baseline: grossly 3/5 MMT 02/21/2022: See updated MMT chart Goal status: PROGRESSING     PLAN: PT FREQUENCY: 1x/week   PT DURATION: 6 weeks   PLANNED INTERVENTIONS: Therapeutic exercises, Therapeutic activity, Neuromuscular re-education, Balance training, Gait training,  Patient/Family education, Joint manipulation, Joint mobilization, Aquatic Therapy, Dry Needling, Electrical stimulation, Spinal manipulation, Spinal mobilization, Cryotherapy, Moist heat, and Manual therapy.   PLAN FOR NEXT SESSION: Review HEP and progress PRN, stretching for hip flexor/quad/hamstring/lumbar, progress strengthening for hip and core, initiate lifting for strength training        Vanessa [place], PT, DPT 03/07/22 4:59 PM

## 2022-03-07 ENCOUNTER — Ambulatory Visit: Payer: Medicaid Other

## 2022-03-07 DIAGNOSIS — M545 Low back pain, unspecified: Secondary | ICD-10-CM | POA: Diagnosis not present

## 2022-03-07 DIAGNOSIS — M6281 Muscle weakness (generalized): Secondary | ICD-10-CM

## 2022-03-11 ENCOUNTER — Other Ambulatory Visit: Payer: Self-pay | Admitting: Internal Medicine

## 2022-03-11 ENCOUNTER — Other Ambulatory Visit: Payer: Self-pay | Admitting: Physical Medicine and Rehabilitation

## 2022-03-11 DIAGNOSIS — I10 Essential (primary) hypertension: Secondary | ICD-10-CM

## 2022-03-11 DIAGNOSIS — K219 Gastro-esophageal reflux disease without esophagitis: Secondary | ICD-10-CM

## 2022-03-14 ENCOUNTER — Ambulatory Visit (INDEPENDENT_AMBULATORY_CARE_PROVIDER_SITE_OTHER): Payer: Medicaid Other

## 2022-03-14 ENCOUNTER — Ambulatory Visit (INDEPENDENT_AMBULATORY_CARE_PROVIDER_SITE_OTHER): Payer: Medicaid Other | Admitting: Physician Assistant

## 2022-03-14 DIAGNOSIS — M25511 Pain in right shoulder: Secondary | ICD-10-CM

## 2022-03-14 DIAGNOSIS — M7501 Adhesive capsulitis of right shoulder: Secondary | ICD-10-CM

## 2022-03-14 NOTE — Progress Notes (Signed)
Office Visit Note   Patient: [name]           Date of Birth: January 25, 1961           MRN: [id number] Visit Date: 03/14/2022              Requested by: Ladell Pier, MD [place],  [place] [id number] PCP: Ladell Pier, MD  Chief Complaint  Patient presents with   Right Shoulder - Pain      HPI: Patient is a pleasant 61 year old woman with a history of type 2 diabetes.  She comes in today complaining of 53-monthhistory of right shoulder stiffness.  No previous injuries but did say that prior to this beginning she did have some pain in her right shoulder.  Assessment & Plan: Visit Diagnoses:  1. Right shoulder pain, unspecified chronicity     Plan: Pleasant 61year old woman who have seen in the past for lower back pain.  She comes in today with a 270-monthistory of stiffness in her right shoulder.  She is left-hand dominant.  She also has a history of diabetes.  She denies any injury though she thinks maybe her shoulder was hurting just prior to when it became stiff.  She has tried doing some stretching but feels it is getting worse.  Findings consistent with adhesive capsulitis.  Discussed with the natural history and treatment of this.  Offered her a steroid injection today which she would like.  We will also order therapy.  She understands the steroid may elevate her blood sugar .  She needs to follow-up with [facility] a month for reevaluation.  Ultimately if she could not regain good motion could consider up manipulation but hopefully therapy will help  Follow-Up Instructions: No follow-ups on file.   Ortho Exam  Patient is alert, oriented, no adenopathy, well-dressed, normal affect, normal respiratory effort. Examination of her right shoulder she has no neck pain she has no redness distal circulation is intact.  No paresthesias.  She does forward elevate to about 100 degrees.  After that she uses accessory muscles to try and bring her shoulder  more upward.  I cannot manipulate further.  She also compensates with abduction and can only abduct to about 45 degrees.  Mild positive impingement findings.  Imaging: XR Shoulder Right  Result Date: 03/14/2022 Radiographs of her right shoulder demonstrate overall well-maintained alignment of the humeral head and the glenoid no significant degenerative changes or acute osseous changes may be very early narrowing of the [facility]  No images are attached to the encounter.  Labs: Lab Results  Component Value Date   HGBA1C 6.4 (A) 12/27/2021   HGBA1C 5.8 08/28/2021   HGBA1C 5.5 04/20/2021   REPTSTATUS 05/13/2017 FINAL 05/05/2017   CULT (A) 05/05/2017    PROPIONIBACTERIUM ACNES Standardized susceptibility testing for this organism is not available.    LABORGA ESCHERICHIA COLI (A) 05/05/2017     Lab Results  Component Value Date   ALBUMIN 4.9 08/28/2021   ALBUMIN 4.9 08/21/2020   ALBUMIN 4.9 01/31/2020    No results found for: MG No results found for: VD25OH  No results found for: PREALBUMIN    Latest Ref Rng & Units 08/28/2021    9:31 AM 10/31/2020    2:26 PM 01/31/2020    3:15 PM  CBC EXTENDED  WBC 3.4 - 10.8 x10E3/uL 8.2   9.0   9.2    RBC 3.77 - 5.28  x10E6/uL 4.92   5.18   5.18    Hemoglobin 11.1 - 15.9 g/dL 15.7   16.1   16.4    HCT 34.0 - 46.6 % 44.8   47.5   48.2    Platelets 150 - 450 x10E3/uL 302   326   337       There is no height or weight on file to calculate BMI.  Orders:  Orders Placed This Encounter  Procedures   XR Shoulder Right   No orders of the defined types were placed in this encounter.    Procedures: No procedures performed  Clinical Data: No additional findings.  ROS:  All other systems negative, except as noted in the HPI. Review of Systems  All other systems reviewed and are negative.  Objective: Vital Signs: LMP 06/06/2015   Specialty Comments:  EXAM: MRI LUMBAR SPINE WITHOUT CONTRAST   TECHNIQUE: Multiplanar,  multisequence MR imaging of the lumbar spine was performed. No intravenous contrast was administered.   COMPARISON:  CT abdomen pelvis dated May 05, 2017; lumbar spine x-rays dated April 07, 2017.   FINDINGS: Segmentation:  Standard.   Alignment:  Physiologic.   Vertebrae:  No fracture, evidence of discitis, or bone lesion.   Conus medullaris: Extends to the L1 level and appears normal.   Paraspinal and other soft tissues: Negative.   Disc levels:   T12-L1:  Normal.   L1-L2:  Normal.   L2-L3:  Small left foraminal protrusion.  No neural impingement.   L3-L4: Small left foraminal disc protrusion. No neural impingement.   L4-L5:  Normal.   L5-S1:  Tiny disc bulge.  No neural impingement.   IMPRESSION: 1. 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 No high-grade spinal canal or neuroforaminal stenosis. No neural impingement.     Electronically Signed By: Titus Dubin M.D. On: 05/16/2017 11:07  PMFS History: Patient Active Problem List   Diagnosis Date Noted   Influenza vaccine needed 10/31/2020   Need for diphtheria-tetanus-pertussis (Tdap) vaccine 10/31/2020   Hyperlipidemia associated with type 2 diabetes mellitus ([place]) 10/31/2020   Overweight (BMI 25.0-29.9) 10/31/2020   Type 2 diabetes mellitus without complication, without long-term current use of insulin ([place]) 07/31/2020   OSA (obstructive sleep apnea) 02/02/2020   Spondylosis without myelopathy or radiculopathy, lumbar region 09/17/2017   Essential hypertension 09/05/2017   Chronic bilateral low back pain with left-sided sciatica 09/05/2017   HTN (hypertension) 05/05/2017   Hypothyroidism 02/24/2017   Mixed hyperlipidemia 02/24/2017   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   Sleep related headaches 01/23/2017   Snoring 01/23/2017   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   Chronic headaches 10/30/2016   Obesity (BMI 30.0-34.9) 05/24/2016   PMB (postmenopausal bleeding)  05/24/2016   Back pain of lumbar region with sciatica 09/28/2015   Past Medical History:  Diagnosis Date   Anxiety    Arthritis    Depression    GERD (gastroesophageal reflux disease)    Hyperlipidemia    Hypertension    Migraines    Thyroid disease     Family History  Problem Relation Age of Onset   Headache Neg Hx    Colon cancer Neg Hx    Esophageal cancer Neg Hx    Rectal cancer Neg Hx    Stomach cancer Neg Hx    Breast cancer Neg Hx     Past Surgical History:  Procedure Laterality Date   BREAST CYST EXCISION Right 1980   BREAST CYST EXCISION Left  1985   NO PAST SURGERIES     Social History   Occupational History   Occupation: Unemployed  Tobacco Use   Smoking status: Never   Smokeless tobacco: Never  Substance and Sexual Activity   Alcohol use: No    Alcohol/week: 0.0 standard drinks   Drug use: No   Sexual activity: Not Currently    Partners: Male

## 2022-03-19 ENCOUNTER — Encounter: Payer: Self-pay | Admitting: Physical Medicine and Rehabilitation

## 2022-03-19 ENCOUNTER — Ambulatory Visit (INDEPENDENT_AMBULATORY_CARE_PROVIDER_SITE_OTHER): Payer: Medicaid Other | Admitting: Physical Medicine and Rehabilitation

## 2022-03-19 VITALS — BP 105/73 | HR 86

## 2022-03-19 DIAGNOSIS — M7918 Myalgia, other site: Secondary | ICD-10-CM | POA: Diagnosis not present

## 2022-03-19 DIAGNOSIS — M545 Low back pain, unspecified: Secondary | ICD-10-CM | POA: Diagnosis not present

## 2022-03-19 DIAGNOSIS — M546 Pain in thoracic spine: Secondary | ICD-10-CM | POA: Diagnosis not present

## 2022-03-19 DIAGNOSIS — G8929 Other chronic pain: Secondary | ICD-10-CM

## 2022-03-19 MED ORDER — DULOXETINE HCL 30 MG PO CPEP: ORAL_CAPSULE | ORAL | 1 refills | Status: DC

## 2022-03-19 NOTE — Progress Notes (Unsigned)
[name] - 61 y.o. female MRN [id number]  Date of birth: 1960-11-04  Office Visit Note: Visit Date: 03/19/2022 PCP: Ladell Pier, MD Referred by: Ladell Pier, MD  Subjective: Chief Complaint  Patient presents with   Lower Back - Pain   HPI: [name] is a 61 y.o. female who comes in today for evaluation of chronic, worsening and severe bilateral lower back pain radiating up back to thoracic region and shoulders. Patient reports pain has been ongoing for several years. She states pain is exacerbated by movement and activity. Patient describes her pain tight, aching and sore, currently rates as 7 out of 10. She reports some relief of pain with home exercise regimen, rest and use of medications. Patient is currently attending formal physical therapy at [facility], states these treatments do help to alleviate pain, however she reports short lived pain relief. Patient was recently started on Celebrex after out last office visit, however she reports this medication did not help to alleviate pain. Patients lumbar MRI from 2018 exhibits minimal degenerative disc disease in the lumbar spine, no high-grade spinal canal stenosis, no neural impingement. Patient was initially seen in our office in 2018, was referred by Fredia Beets, [place].  She has had multiple lumbar injections in our office over the years including transforaminal, facet joint blocks and radiofrequency ablation. Patient reports no relief of pain with these procedures. Patient was recently evaluated by Bevely Palmer Persons, PA in our office for chronic right shoulder pain, states short lived pain relief with right shoulder injection. Patient states her pain continues to be a source of aggravation and is also causing depression. Patient denies focal weakness, numbness and tingling. Patient denies recent trauma or falls.   Review of Systems  Musculoskeletal:  Positive for back pain and  myalgias.  Neurological:  Negative for tingling, sensory change, focal weakness and weakness.  All other systems reviewed and are negative. Otherwise per HPI.  Assessment & Plan: Visit Diagnoses:    ICD-10-CM   1. Chronic bilateral low back pain without sciatica  M54.50 MR LUMBAR SPINE WO CONTRAST   G89.29     2. Chronic bilateral thoracic back pain  M54.6 MR LUMBAR SPINE WO CONTRAST   G89.29     3. Myofascial pain syndrome  M79.18 MR LUMBAR SPINE WO CONTRAST       Plan: Findings:  Chronic, worsening and severe bilateral lower back pain radiating up back to thoracic region and shoulders. Patient continues to have severe pain despite good conservative therapies such as home exercise regimen, rest and use of medications. No relief of pain with recent trial of Celebrex, short term pain relief with physical therapy treatments.  Patient's clinical presentation and exam continue to be consistent with myofascial pain.  Her pain pattern is non dermatomal in nature. I also feel there could be a type of central sensitization syndrome such as fibromyalgia contributing to her pain. I do not feel repeating lumbar injections at this time would be beneficial. We believe the next step is to obtain lumbar MRI imaging, last imaging was 2018. I also talked with her about medication management and placed prescription for Cymbalta. I would like to see patient back for follow up in approximately 4 weeks for re-evaluation and lumbar MRI review. Patient encouraged to remain active and to continue with home exercise regimen as tolerated. No red flag symptoms noted upon exam today.    Meds & Orders:  Meds ordered this  encounter  Medications   DULoxetine (CYMBALTA) 30 MG capsule    Sig: Take 1 capsule (30 mg total) once a day by mouth for 2 weeks, then take 1 capsule (30 mg) twice a day.    Dispense:  60 capsule    Refill:  1    Order Specific Question:   Supervising Provider    Answer:   Magnus Sinning [[id number]]     Orders Placed This Encounter  Procedures   MR LUMBAR SPINE WO CONTRAST    Follow-up: Return for 4 week follow up for re-evaluation.   Procedures: No procedures performed      Clinical History: EXAM: MRI LUMBAR SPINE WITHOUT CONTRAST   TECHNIQUE: Multiplanar, multisequence MR imaging of the lumbar spine was performed. No intravenous contrast was administered.   COMPARISON:  CT abdomen pelvis dated May 05, 2017; lumbar spine x-rays dated April 07, 2017.   FINDINGS: Segmentation:  Standard.   Alignment:  Physiologic.   Vertebrae:  No fracture, evidence of discitis, or bone lesion.   Conus medullaris: Extends to the L1 level and appears normal.   Paraspinal and other soft tissues: Negative.   Disc levels:   T12-L1:  Normal.   L1-L2:  Normal.   L2-L3:  Small left foraminal protrusion.  No neural impingement.   L3-L4: Small left foraminal disc protrusion. No neural impingement.   L4-L5:  Normal.   L5-S1:  Tiny disc bulge.  No neural impingement.   IMPRESSION: 1. 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 No high-grade spinal canal or neuroforaminal stenosis. No neural impingement.     Electronically Signed By: Titus Dubin M.D. On: 05/16/2017 11:07   She reports that she has never smoked. She has never used smokeless tobacco.  Recent Labs    04/20/21 1015 08/28/21 0839 12/27/21 1550  HGBA1C 5.5 5.8 6.4*    Objective:  VS:  HT:    WT:   BMI:     BP:105/73  HR:86bpm  TEMP: ( )  RESP:  Physical Exam Vitals and nursing note reviewed.  HENT:     Head: Normocephalic and atraumatic.     Right Ear: External ear normal.     Left Ear: External ear normal.     Nose: Nose normal.     Mouth/Throat:     Mouth: Mucous membranes are moist.  Eyes:     Extraocular Movements: Extraocular movements intact.  Cardiovascular:     Rate and Rhythm: Normal rate.     Pulses: Normal pulses.  Pulmonary:     Effort: Pulmonary  effort is normal.  Abdominal:     General: Abdomen is flat. There is no distension.  Musculoskeletal:        General: Tenderness present.     Cervical back: Normal range of motion.     Comments: No discomfort noted with flexion, extension and side-to-side rotation. Patient has good strength in the upper extremities including 5 out of 5 strength in wrist extension, long finger flexion and APB.  There is no atrophy of the hands intrinsically. Tenderness noted upon palpation of bilateral thoracic paraspinal regions. Sensation intact bilaterally.    Skin:    General: Skin is warm and dry.     Capillary Refill: Capillary refill takes less than 2 seconds.  Neurological:     General: No focal deficit present.     Mental Status: She is alert and oriented to person, place, and time.  Psychiatric:  Mood and Affect: Mood normal.        Behavior: Behavior normal.    Ortho Exam  Imaging: No results found.  Past Medical/Family/Surgical/Social History: Medications & Allergies reviewed per EMR, new medications updated. Patient Active Problem List   Diagnosis Date Noted   Adhesive capsulitis of right shoulder 03/14/2022   Influenza vaccine needed 10/31/2020   Need for diphtheria-tetanus-pertussis (Tdap) vaccine 10/31/2020   Hyperlipidemia associated with type 2 diabetes mellitus ([place]) 10/31/2020   Overweight (BMI 25.0-29.9) 10/31/2020   Type 2 diabetes mellitus without complication, without long-term current use of insulin ([place]) 07/31/2020   OSA (obstructive sleep apnea) 02/02/2020   Spondylosis without myelopathy or radiculopathy, lumbar region 09/17/2017   Essential hypertension 09/05/2017   Chronic bilateral low back pain with left-sided sciatica 09/05/2017   HTN (hypertension) 05/05/2017   Hypothyroidism 02/24/2017   Mixed hyperlipidemia 02/24/2017   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   Sleep related headaches 01/23/2017   Snoring 01/23/2017   Insomnia due to mental condition  01/23/2017   Chronic headaches 10/30/2016   Obesity (BMI 30.0-34.9) 05/24/2016   PMB (postmenopausal bleeding) 05/24/2016   Back pain of lumbar region with sciatica 09/28/2015   Past Medical History:  Diagnosis Date   Anxiety    Arthritis    Depression    GERD (gastroesophageal reflux disease)    Hyperlipidemia    Hypertension    Migraines    Thyroid disease    Family History  Problem Relation Age of Onset   Headache Neg Hx    Colon cancer Neg Hx    Esophageal cancer Neg Hx    Rectal cancer Neg Hx    Stomach cancer Neg Hx    Breast cancer Neg Hx    Past Surgical History:  Procedure Laterality Date   BREAST CYST EXCISION Right 1980   BREAST CYST EXCISION Left 1985   NO PAST SURGERIES     Social History   Occupational History   Occupation: Unemployed  Tobacco Use   Smoking status: Never   Smokeless tobacco: Never  Substance and Sexual Activity   Alcohol use: No    Alcohol/week: 0.0 standard drinks   Drug use: No   Sexual activity: Not Currently    Partners: Male

## 2022-03-19 NOTE — Progress Notes (Unsigned)
.  Pt state lower back pain that travels up her back. Pt state walking, standing and laying down makes the pain worse. Pt state she take pain meds to help ease her pain.  Numeric Pain Rating Scale and Functional Assessment Average Pain 10 Pain Right Now 7 My pain is intermittent, burning, dull, stabbing, and aching Pain is worse with: walking, standing, some activites, and laying down Pain improves with: medication and injections   In the last MONTH (on 0-10 scale) has pain interfered with the following?  1. General activity like being  able to carry out your everyday physical activities such as walking, climbing stairs, carrying groceries, or moving a chair?  Rating(5)  2. Relation with others like being able to carry out your usual social activities and roles such as  activities at home, at work and in your community. Rating(6)  3. Enjoyment of life such that you have  been bothered by emotional problems such as feeling anxious, depressed or irritable?  Rating(7)

## 2022-03-21 ENCOUNTER — Ambulatory Visit: Payer: Medicaid Other | Attending: Physician Assistant | Admitting: Occupational Therapy

## 2022-03-21 ENCOUNTER — Encounter: Payer: Self-pay | Admitting: Occupational Therapy

## 2022-03-21 DIAGNOSIS — M545 Low back pain, unspecified: Secondary | ICD-10-CM | POA: Insufficient documentation

## 2022-03-21 DIAGNOSIS — G8929 Other chronic pain: Secondary | ICD-10-CM | POA: Diagnosis present

## 2022-03-21 DIAGNOSIS — M6281 Muscle weakness (generalized): Secondary | ICD-10-CM | POA: Insufficient documentation

## 2022-03-21 DIAGNOSIS — M25511 Pain in right shoulder: Secondary | ICD-10-CM | POA: Insufficient documentation

## 2022-03-21 DIAGNOSIS — M25611 Stiffness of right shoulder, not elsewhere classified: Secondary | ICD-10-CM | POA: Insufficient documentation

## 2022-03-21 NOTE — Patient Instructions (Signed)
SELF ASSISTED WITH OBJECT:   Shoulder Flexion - Supine    Hold cane with both hands. Raise arms overhead, keep elbows straight. 10_ reps per set, 2-3 sets per day, _5_ days per week  Cane Horizontal - Supine    With straight arms holding cane above shoulders, bring cane out to right, center, out to left, and back to above head. Repeat 10_ times. Do 2-3_ times per day Copyright  VHI. All rights reserved.   Supine: Chest Press (Active)    Lie on back with arms fully extended. Lower bar or dowel slowly to chest and press to arm's length. Use cane Complete 3 sets of _10 repetitions.   Shoulder: Abduction (Supine)    With right arm flat on floor, hold dowel in palm. Slowly move arm up to side of head by pushing with opposite arm. Do not let elbow bend. Hold _10_ seconds. Repeat  10_ times. Do 3_ sessions per day. CAUTION: Stretch slowly and gently.  Copyright  VHI. All rights reserved.   Chest / Shoulder Stretch: Yardstick Along Spine    Hold stick horizontally against buttocks, hands shoulder width apart. Bend elbows, and raise stick along spine. Hold 1 count. Slowly return to starting position. Repeat ____ times.  Copyright  VHI. All rights reserved.

## 2022-03-21 NOTE — Therapy (Signed)
OUTPATIENT OCCUPATIONAL THERAPY ORTHO EVALUATION  Patient Name: [name] MRN: [id number] DOB:1961-02-26, 61 y.o., female Today's Date: 03/21/2022  PCP: Ladell Pier, MD REFERRING PROVIDER: Persons, Bevely Palmer, [place]   OT End of Session - 03/21/22 1105     Visit Number 1    Number of Visits 9    Date for OT Re-Evaluation 05/02/22    Authorization Type [employer] Medicaid    Authorization Time Period VL: 27 combined    OT Start Time 1100    OT Stop Time 1145    OT Time Calculation (min) 45 min    Activity Tolerance Patient tolerated treatment well    Behavior During Therapy WFL for tasks assessed/performed             Past Medical History:  Diagnosis Date   Anxiety    Arthritis    Depression    GERD (gastroesophageal reflux disease)    Hyperlipidemia    Hypertension    Migraines    Thyroid disease    Past Surgical History:  Procedure Laterality Date   BREAST CYST EXCISION Right 1980   BREAST CYST EXCISION Left 1985   NO PAST SURGERIES     Patient Active Problem List   Diagnosis Date Noted   Adhesive capsulitis of right shoulder 03/14/2022   Influenza vaccine needed 10/31/2020   Need for diphtheria-tetanus-pertussis (Tdap) vaccine 10/31/2020   Hyperlipidemia associated with type 2 diabetes mellitus ([place]) 10/31/2020   Overweight (BMI 25.0-29.9) 10/31/2020   Type 2 diabetes mellitus without complication, without long-term current use of insulin ([place]) 07/31/2020   OSA (obstructive sleep apnea) 02/02/2020   Spondylosis without myelopathy or radiculopathy, lumbar region 09/17/2017   Essential hypertension 09/05/2017   Chronic bilateral low back pain with left-sided sciatica 09/05/2017   HTN (hypertension) 05/05/2017   Hypothyroidism 02/24/2017   Mixed hyperlipidemia 02/24/2017   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   Sleep related headaches 01/23/2017   Snoring 01/23/2017   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   Chronic headaches 10/30/2016   Obesity (BMI  30.0-34.9) 05/24/2016   PMB (postmenopausal bleeding) 05/24/2016   Back pain of lumbar region with sciatica 09/28/2015    ONSET DATE: 03/14/2022   REFERRING DIAG: M75.01 (ICD-10-CM) - Adhesive capsulitis of right shoulder   THERAPY DIAG:  Muscle weakness (generalized)  Stiffness of right shoulder, not elsewhere classified  Acute pain of right shoulder  Rationale for Evaluation and Treatment Rehabilitation  SUBJECTIVE:   SUBJECTIVE STATEMENT: Started about 2 years ago per patient - pt reports "just thought it was the way I slept on it". "got really bad about 2 weeks ago and I told the people at the rehabilitation center". "The doctor says I have frozen shoulder".  Pt accompanied by: self  PERTINENT HISTORY: DM  PRECAUTIONS: Fall  WEIGHT BEARING RESTRICTIONS No  PAIN:  Are you having pain? Yes: NPRS scale: 7/10 Pain location: RUE shoulder Pain description: sharp Aggravating factors: abduction, internal rotation Relieving factors: rest  FALLS: Has patient fallen in last 6 months? No  LIVING ENVIRONMENT: Lives with: lives with their son Lives in: House/apartment Stairs: 2nd floor apartment - tub/shower combo Has following equipment at home: None  PLOF: Vocation/Vocational requirements: on disability, Leisure: go shopping, and Independent  PATIENT GOALS "hoping to get rid of this soreness so I can continue to do helping my son with the groceries and be able to lift something without going (winces)"  OBJECTIVE:   HAND DOMINANCE: Left  ADLs: Overall ADLs: Independent with all  ADLs Eating: Independent Grooming: Independent UB Dressing: Independent LB Dressing: Independent Toileting: Independent Bathing: Independent Tub Shower transfers: Independent Equipment: none  FUNCTIONAL OUTCOME MEASURES: Quick Dash: 45.5% difficulty  UPPER EXTREMITY ROM     Active ROM Right eval Left eval  Shoulder flexion 135 WFL  Shoulder abduction 95 WFL  Shoulder adduction     Shoulder extension    Shoulder internal rotation Limited WFL  Shoulder external rotation    Elbow flexion WFL  Elbow extension   Wrist flexion    Wrist extension    Wrist ulnar deviation    Wrist radial deviation    Wrist pronation    Wrist supination    (Blank rows = not tested)    UPPER EXTREMITY MMT:     MMT Right eval Left eval  Shoulder flexion WFL  Shoulder abduction   Shoulder adduction   Shoulder extension   Shoulder internal rotation   Shoulder external rotation   Middle trapezius   Lower trapezius   Elbow flexion   Elbow extension   Wrist flexion   Wrist extension   Wrist ulnar deviation   Wrist radial deviation   Wrist pronation   Wrist supination   (Blank rows = not tested)  HAND FUNCTION: Grip strength: Right: 42.3 lbs; Left: 50.7 lbs  COORDINATION: None performed - pt's deficits at RUE shoulder / proximal  SENSATION: Reports pins and needles in RUE shoulder and traveling distally  EDEMA: None   COGNITION: Overall cognitive status: No family/caregiver present to determine baseline cognitive functioning  OBSERVATIONS: forward head and shoulders with rounded posture (kyphotic)   TODAY'S TREATMENT:  03/21/22 Issued HEP For supine shoulder and shoulder extension   PATIENT EDUCATION: Education details: Education on role and purpose of OT  Person educated: Patient Education method: Explanation Education comprehension: verbalized understanding   HOME EXERCISE PROGRAM: 03/21/22 - HEP supine shoulder, standing shoulder extension  GOALS: Goals reviewed with patient? Yes   LONG TERM GOALS: Target date: 05/02/2022    Pt will be independent with HEP targeting RUE ROM, gentle strengthening  Baseline: initiated at eval Goal status: IN PROGRESS  2.  Pt will increase range of motion in RUE shoulder flexion by at least 10 degrees to improve ability to reach with pain equal to or less than 6/10.  Baseline: 7/10 pain, 135* Goal status:  INITIAL  3.  Pt will increase range of motion in internal rotation for increase in ability to reach behind back and complete bathing and toileting hygiene with decreased report of pain. Baseline:  Goal status: INITIAL  4.  Pt will report decreased pain and discomfort while assisting with carrying groceries. Baseline:  Goal status: INITIAL   ASSESSMENT:  CLINICAL IMPRESSION: Patient is a 61 y.o. female who was seen today for occupational therapy evaluation for RUE shoulder capsulitis. Pt presents with limited ROM in RUE and reports of pain. Pt is being seen by Ortho PT for lower back pain. Skilled occupational therapy is recommended to target listed areas of deficit and increase independence with ADLs and IADLs and decrease caregiver burden.    PERFORMANCE DEFICITS in functional skills including ADLs, IADLs, ROM, strength, FMC, decreased knowledge of use of DME, and UE functional use, cognitive skills including attention, emotional, temperament/personality, and understand  IMPAIRMENTS are limiting patient from ADLs, IADLs, and leisure.   COMORBIDITIES has no other co-morbidities that affects occupational performance. Patient will benefit from skilled OT to address above impairments and improve overall function.  MODIFICATION OR ASSISTANCE TO COMPLETE EVALUATION:  No modification of tasks or assist necessary to complete an evaluation.  OT OCCUPATIONAL PROFILE AND HISTORY: Problem focused assessment: Including review of records relating to presenting problem.  CLINICAL DECISION MAKING: LOW - limited treatment options, no task modification necessary  REHAB POTENTIAL: Good  EVALUATION COMPLEXITY: Low      PLAN: OT FREQUENCY: 2x/week  OT DURATION: 6 weeks 8 visits over 6 weeks d/t any scheduling conflicts  PLANNED INTERVENTIONS: self care/ADL training, therapeutic exercise, therapeutic activity, passive range of motion, ultrasound, moist heat, and DME and/or AE  instructions  RECOMMENDED OTHER SERVICES: pt getting PT for low back pain at [facility]: Patient  PLAN FOR NEXT SESSION: review HEP, ultrasound to shoulder, give pendulum printouts (forgot to print out), gentle ROM   Zachery Conch, OT 03/21/2022, 12:34 PM

## 2022-03-26 ENCOUNTER — Encounter: Payer: Medicaid Other | Admitting: Occupational Therapy

## 2022-03-26 NOTE — Therapy (Signed)
OUTPATIENT OCCUPATIONAL THERAPY ORTHO TREATMENT  Patient Name: [name] MRN: [id number] DOB:04/25/1961, 61 y.o., female Today's Date: 03/28/2022  PCP: Ladell Pier, MD REFERRING PROVIDER: Persons, Bevely [place], [place]   OT End of Session - 03/28/22 0856     Visit Number 2    Number of Visits 9    Date for OT Re-Evaluation 05/02/22    Authorization Type [employer] Medicaid    Authorization Time Period VL: 62 combined    OT Start Time 0855    OT Stop Time 0934    OT Time Calculation (min) 39 min    Activity Tolerance Patient tolerated treatment well    Behavior During Therapy WFL for tasks assessed/performed              Past Medical History:  Diagnosis Date   Anxiety    Arthritis    Depression    GERD (gastroesophageal reflux disease)    Hyperlipidemia    Hypertension    Migraines    Thyroid disease    Past Surgical History:  Procedure Laterality Date   BREAST CYST EXCISION Right 1980   BREAST CYST EXCISION Left 1985   NO PAST SURGERIES     Patient Active Problem List   Diagnosis Date Noted   Adhesive capsulitis of right shoulder 03/14/2022   Influenza vaccine needed 10/31/2020   Need for diphtheria-tetanus-pertussis (Tdap) vaccine 10/31/2020   Hyperlipidemia associated with type 2 diabetes mellitus ([place]) 10/31/2020   Overweight (BMI 25.0-29.9) 10/31/2020   Type 2 diabetes mellitus without complication, without long-term current use of insulin ([place]) 07/31/2020   OSA (obstructive sleep apnea) 02/02/2020   Spondylosis without myelopathy or radiculopathy, lumbar region 09/17/2017   Essential hypertension 09/05/2017   Chronic bilateral low back pain with left-sided sciatica 09/05/2017   HTN (hypertension) 05/05/2017   Hypothyroidism 02/24/2017   Mixed hyperlipidemia 02/24/2017   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   Sleep related headaches 01/23/2017   Snoring 01/23/2017   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   Chronic headaches 10/30/2016   Obesity  (BMI 30.0-34.9) 05/24/2016   PMB (postmenopausal bleeding) 05/24/2016   Back pain of lumbar region with sciatica 09/28/2015    ONSET DATE: 03/14/2022   REFERRING DIAG: M75.01 (ICD-10-CM) - Adhesive capsulitis of right shoulder   THERAPY DIAG:  Stiffness of right shoulder, not elsewhere classified  Acute pain of right shoulder  Muscle weakness (generalized)  Rationale for Evaluation and Treatment Rehabilitation  SUBJECTIVE:   SUBJECTIVE STATEMENT: It's really hard to relax my shoulder   Pt accompanied by: self  PERTINENT HISTORY: DM  PRECAUTIONS: Fall  WEIGHT BEARING RESTRICTIONS No  PAIN:  Are you having pain? Yes: NPRS scale: 4-5/10 Pain location: RUE shoulder Pain description: sharp Aggravating factors: movement Relieving factors: rest  PATIENT GOALS "hoping to get rid of this soreness so I can continue to do helping my son with the groceries and be able to lift something without going (winces)"  OBJECTIVE:   TODAY'S TREATMENT:   Ultrasound x 64mn to R lateral shoulder 363m, 0.8wts/cm2, continuous with no adverse reactions for pain.  In prone, scapular retraction/shoulder ext with min facilitation.  In standing, rolling ball up wall with BUEs with min cueing/facilitation for scapular depression.  Pt instructed to avoid guarding positioning with functional activities (shoulder hike, IR/elbow flex).    PATIENT EDUCATION: Education details: Reviewed HEP, added pendulum exercises to HEP--see pt instructions Person educated: Patient Education method: Explanation, Demonstration, Verbal cues, and Handouts Education comprehension: verbalized understanding, returned demonstration, and  verbal cues required   HOME EXERCISE PROGRAM: 03/21/22 - HEP supine shoulder, standing shoulder extension 03/28/22:  reviewed HEP, added pendulum  GOALS: Goals reviewed with patient? Yes   LONG TERM GOALS: Target date: 05/02/2022    Pt will be independent with HEP targeting RUE  ROM, gentle strengthening  Baseline: initiated at eval Goal status: IN PROGRESS  2.  Pt will increase range of motion in RUE shoulder flexion by at least 10 degrees to improve ability to reach with pain equal to or less than 6/10.  Baseline: 7/10 pain, 135* Goal status: INITIAL  3.  Pt will increase range of motion in internal rotation for increase in ability to reach behind back and complete bathing and toileting hygiene with decreased report of pain. Baseline:  Goal status: INITIAL  4.  Pt will report decreased pain and discomfort while assisting with carrying groceries. Baseline:  Goal status: INITIAL   ASSESSMENT:  CLINICAL IMPRESSION: Pt is progressing towards goals, she is able to return demo to HEP with min cueing to slow down and move within pain-free range with goal for stretch/movement.  Pt also needs cueing to avoid guarding patterns with RUE.     PERFORMANCE DEFICITS in functional skills including ADLs, IADLs, ROM, strength, FMC, decreased knowledge of use of DME, and UE functional use, cognitive skills including attention, emotional, temperament/personality, and understand  IMPAIRMENTS are limiting patient from ADLs, IADLs, and leisure.   COMORBIDITIES has no other co-morbidities that affects occupational performance. Patient will benefit from skilled OT to address above impairments and improve overall function.  MODIFICATION OR ASSISTANCE TO COMPLETE EVALUATION: No modification of tasks or assist necessary to complete an evaluation.  OT OCCUPATIONAL PROFILE AND HISTORY: Problem focused assessment: Including review of records relating to presenting problem.  CLINICAL DECISION MAKING: LOW - limited treatment options, no task modification necessary  REHAB POTENTIAL: Good  EVALUATION COMPLEXITY: Low      PLAN: OT FREQUENCY: 2x/week  OT DURATION: 6 weeks 8 visits over 6 weeks d/t any scheduling conflicts  PLANNED INTERVENTIONS: self care/ADL training,  therapeutic exercise, therapeutic activity, passive range of motion, ultrasound, moist heat, and DME and/or AE instructions  RECOMMENDED OTHER SERVICES: pt getting PT for low back pain at [facility]: Patient  PLAN FOR NEXT SESSION:  continue with ROM, ultrasound to shoulder, ?consider kinesiotaping, R shoulder positioning  Lashan Macias, OT 03/28/2022, 12:32 PM  Vianne Bulls, OTR/L [facility] [address] [place], [place]  [address] [phone] phone [phone] 03/28/22 12:32 PM

## 2022-03-27 ENCOUNTER — Encounter: Payer: Self-pay | Admitting: Physical Therapy

## 2022-03-27 ENCOUNTER — Ambulatory Visit: Payer: Medicaid Other | Admitting: Physical Therapy

## 2022-03-27 DIAGNOSIS — M6281 Muscle weakness (generalized): Secondary | ICD-10-CM | POA: Diagnosis not present

## 2022-03-27 DIAGNOSIS — G8929 Other chronic pain: Secondary | ICD-10-CM

## 2022-03-27 NOTE — Therapy (Signed)
OUTPATIENT PHYSICAL THERAPY TREATMENT NOTE/ RE-EVALUATION   Patient Name: LASHIKA ERKER MRN: [id number] DOB:27-Nov-1960, 61 y.o., female Today's Date: 03/27/2022  PCP: Ladell Pier, MD REFERRING PROVIDER: Ladell Pier, MD   PT End of Session - 03/27/22 [phone]     Visit Number 7    Number of Visits 11    Date for PT Re-Evaluation 04/11/22    Authorization Type [employer]    Authorization - Visit Number 7    Authorization - Number of Visits 71    PT Start Time [phone]    PT Stop Time 1016    PT Time Calculation (min) 38 min    Activity Tolerance Patient tolerated treatment well    Behavior During Therapy WFL for tasks assessed/performed               Past Medical History:  Diagnosis Date   Anxiety    Arthritis    Depression    GERD (gastroesophageal reflux disease)    Hyperlipidemia    Hypertension    Migraines    Thyroid disease    Past Surgical History:  Procedure Laterality Date   BREAST CYST EXCISION Right 1980   BREAST CYST EXCISION Left 1985   NO PAST SURGERIES     Patient Active Problem List   Diagnosis Date Noted   Adhesive capsulitis of right shoulder 03/14/2022   Influenza vaccine needed 10/31/2020   Need for diphtheria-tetanus-pertussis (Tdap) vaccine 10/31/2020   Hyperlipidemia associated with type 2 diabetes mellitus ([place]) 10/31/2020   Overweight (BMI 25.0-29.9) 10/31/2020   Type 2 diabetes mellitus without complication, without long-term current use of insulin ([place]) 07/31/2020   OSA (obstructive sleep apnea) 02/02/2020   Spondylosis without myelopathy or radiculopathy, lumbar region 09/17/2017   Essential hypertension 09/05/2017   Chronic bilateral low back pain with left-sided sciatica 09/05/2017   HTN (hypertension) 05/05/2017   Hypothyroidism 02/24/2017   Mixed hyperlipidemia 02/24/2017   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   Sleep related headaches 01/23/2017   Snoring 01/23/2017   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    Chronic headaches 10/30/2016   Obesity (BMI 30.0-34.9) 05/24/2016   PMB (postmenopausal bleeding) 05/24/2016   Back pain of lumbar region with sciatica 09/28/2015   PCP: Ladell Pier, MD   REFERRING PROVIDER: Persons, Bevely Palmer, PA  THERAPY DIAG:  Muscle weakness (generalized)  Chronic bilateral low back pain, unspecified whether sciatica present  PERTINENT HISTORY: DM   PRECAUTIONS: None  SUBJECTIVE:   Pt reports that she is doing well.  She has 0/10 pain currently and average of 7/10 at some point during the day.  PAIN:  Are you having pain?  Yes: NPRS scale: 5/10  Pain location: Lower back, bilateral Pain description: Tightening Aggravating factors: Standing, walking, sitting extended periods Relieving factors: Medication      OBJECTIVE:  *Unless otherwise noted, objective information collected previously*  DIAGNOSTIC FINDINGS:  Lumbar X-ray 12/21/2021: lumbar spine demonstrate well-maintained alignment very similar x-rays to previous she does have some calcification within the aorta but no different than her previous x-rays.  Good spacing no listhesis that I can appreciate minor spurring of the endplates.   PATIENT SURVEYS:  Modified Oswestry 33/50, 66% 02/21/2022: 24/50, 48%   SCREENING FOR RED FLAGS: Negative   COGNITION: Overall cognitive status: Within functional limits for tasks assessed                          SENSATION: [facility]  MUSCLE LENGTH: Hamstring, hip flexor, and quad flexibility limitation   POSTURE:  Rounded shoulder and forward head posture,    PALPATION: Mildly tender to palpation bilateral paraspinals   LUMBAR ROM:    Active  A/ROM  01/02/2022 AROM 02/21/2022  Flexion 75% 100%  Extension 75% 100%  Right lateral flexion 75% 100%  Left lateral flexion 75% 100%  Right rotation 75% 75%  Left rotation 75% 100%  Patient denies any change in symptoms with active motion   LE ROM:                       HIP PROM grossly WFL and  non-painful   LE MMT:   MMT Right 01/02/2022 Left 01/02/2022 Right  02/13/22 Left 02/13/22 Right 02/21/2022 Left 02/21/2022  Core (DLLT) 3      Hip flexion 4 4   5/5 5/5  Hip extension 3 3-   3/5 3/5  Hip abduction 3 3- 4/5 4- 4-/5 4-/5  Knee flexion 5 5      Knee extension 5 5        LUMBAR SPECIAL TESTS:  Lumbar radicular testing negative   GAIT: Assistive device utilized: None Level of assistance: Complete [place] Comments: decreased gait speed, compensated trendelenburg bilaterally     TODAY'S TREATMENT   [facility] Adult PT Treatment:                                                DATE: 03/27/2022 Therapeutic Exercise: nu-step L5 5mwhile taking subjective and planning session with patient Supine 90/90 abdominal isometric with handhold resistance 4x30sec (NT) SKTC 3x30sec Thomas stretch + SKTC x242m BIL Bridge emphasizing hip ext - 3x10 Side clam w/ YTB 2x10 BIL Bird dog - 2x10   [facility] Adult PT Treatment:                                                DATE: 03/07/2022 Therapeutic Exercise: Supine 90/90 abdominal isometric with handhold resistance 4x30sec DKTC 2x30sec Thomas stretch [id number] Side knee plank with clams 2x10 BIL Seated Russian twist 3x20 Standing hip extension with 7# cable to ankle at Free Motion 2x10 BIL Standing hip abduction with 7# cable to ankle at Free Motion 2x10 BIL Manual Therapy: N/A Neuromuscular re-ed: N/A Therapeutic Activity: N/A Modalities: N/A Self Care: N/A   [facility]                                                [facility]: [facility]20[facility] Therapeutic Exercise: Standard plank x5 to failure (roughly 5-sec each) Knee planks x4 to failure (roughly 20 seconds) Sidelying hip abduction with YTB x12 BIL Standing Cybex hip abduction with 17.5# cable 2x10 BIL Standing Cybex hip extension with 17.5# cable 2x10 Manual Therapy: N/A Neuromuscular re-ed: N/A Therapeutic Activity: Re-assessment of objective measures/ rehab goals  with pt education on progress to date and POC update Re-administration of ODI with pt education Modalities: N/A Self Care: N/A   [facility]  [facility]: [facility] Therapeutic Exercise: Nustep L5 x 7 min Supine 90/90 abdominal isometric against handhold resistance 3x30sec Side knee plank x4 to failure BIL SKTC Side hip abduction 10 x 2  Standing Cybex hip abduction with 17.5# cable 2x10 BIL- mod cues  Leg press 40# 10 x 2        PATIENT EDUCATION:  Education details: Exam findings, POC, HEP Person educated: Patient Education method: Explanation, Demonstration, Tactile cues, Verbal cues, and Handouts Education comprehension: verbalized understanding, returned demonstration, verbal cues required, tactile cues required, and needs further education   HOME EXERCISE PROGRAM: Access Code: [email or web address] URL: [email or web address] Date: 02/13/2022 Prepared by: Hessie Diener  Exercises - Supine Piriformis Stretch with Foot on Ground  - 2 x daily - 3 reps - 20 seconds hold - Supine Lower Trunk Rotation  - 2 x daily - 5 reps - 5 seconds hold - Seated Hamstring Stretch  - 2 x daily - 3 reps - 20 seconds hold - Bridge  - 1 x daily - 2 sets - 10 reps - 3 seconds hold - Straight Leg Raise  - 1 x daily - 2 sets - 10 reps - Clam with Resistance  - 1 x daily - 2 sets - 10 reps - Modified Thomas Stretch  - 1 x daily - 7 x weekly - 1 sets - 3 reps - 30 hold - Supine Bilateral Isometric Hip Flexion  - 1 x daily - 7 x weekly - 1 sets - 3 reps - 30 hold - Side Plank on Knees  - 1 x daily - 7 x weekly - 1 sets - 5 reps - 10-30 hold - Sidelying Hip Abduction  - 1 x daily - 7 x weekly - 2-3 sets - 10 reps       ASSESSMENT: CLINICAL IMPRESSION: Aireona did well with therapy.  She requires cueing throughout for form and pacing.  We concentrated on hip flexor stretching followed by hip extensor strengthening.  [place] dog is particularly  difficult and unstable, requiring CGA  Pt able to complete therex with no adverse reaction.      OBJECTIVE IMPAIRMENTS Abnormal gait, decreased activity tolerance, decreased ROM, decreased strength, impaired flexibility, postural dysfunction, and pain.    ACTIVITY LIMITATIONS community activity, occupation, and shopping.    PERSONAL FACTORS Fitness, Past/current experiences, and Time since onset of injury/illness/exacerbation are also affecting patient's functional outcome.          GOALS: Goals reviewed with patient? Yes   SHORT TERM GOALS: Target date: 01/30/2022   Patient will be I with initial HEP in order to progress with therapy. Baseline: HEP provided at evaluation 02/21/2022: Pt reports daily adherence to her HEP Goal status: ACHIEVED   2.  Patient will report pain level no more than 6/10 with activity in order to reduce functional limitations. Baseline: patient reports pain up to 11/10 02/21/2022: 5/10 Goal status: ACHIEVED   LONG TERM GOALS: Target date: 04/11/2022 (extended)   Patient will be I with final HEP to maintain progress from PT. Baseline: HEP provided at evaluation 02/21/2022: Pt reports daily adherence to an updated HEP Goal status: ACHIEVED   2.  Patient will report Modified ODI </= 20/50 in order to indicate improved functional ability Baseline: Modified ODI 33/50 02/21/2022: 24/50 Goal status: PROGRESSING   3.  Patient will be able to walk >/= 45 minutes in order to walk to grocery store. Baseline: patient reports increased low back pain with walking 15 minutes and need to stop and rest  02/21/2022: Pt reports being able to walk about 30 minutes before needing a seated rest Goal status: PROGRESSING   4.  Patient will demonstrate core and hip strength grossly >/= 4-/5 MMT in order to improve standing and walking tolerance Baseline: grossly 3/5 MMT 02/21/2022: See updated MMT chart Goal status: PROGRESSING     PLAN: PT FREQUENCY: 1x/week   PT DURATION: 6  weeks   PLANNED INTERVENTIONS: Therapeutic exercises, Therapeutic activity, Neuromuscular re-education, Balance training, Gait training, Patient/Family education, Joint manipulation, Joint mobilization, Aquatic Therapy, Dry Needling, Electrical stimulation, Spinal manipulation, Spinal mobilization, Cryotherapy, Moist heat, and Manual therapy.   PLAN FOR NEXT SESSION: Review HEP and progress PRN, stretching for hip flexor/quad/hamstring/lumbar, progress strengthening for hip and core, initiate lifting for strength training        Kevan Ny Doloras [place] PT 03/27/22 10:18 AM

## 2022-03-28 ENCOUNTER — Ambulatory Visit: Payer: Medicaid Other | Admitting: Occupational Therapy

## 2022-03-28 ENCOUNTER — Encounter: Payer: Self-pay | Admitting: Occupational Therapy

## 2022-03-28 DIAGNOSIS — M6281 Muscle weakness (generalized): Secondary | ICD-10-CM | POA: Diagnosis not present

## 2022-03-28 DIAGNOSIS — M25511 Pain in right shoulder: Secondary | ICD-10-CM

## 2022-03-28 DIAGNOSIS — M25611 Stiffness of right shoulder, not elsewhere classified: Secondary | ICD-10-CM

## 2022-03-28 NOTE — Patient Instructions (Addendum)
   Pendulum Circular    Bend forward 90 at waist, leaning on table for support. Rock body in a circular pattern to move arm clockwise 10 times then counterclockwise 10 times. Do 3 sessions per day.  Copyright  VHI. All rights reserved.  Pendulum Side to Side    Bend forward 90 at waist, leaning on table for support. Rock body from side to side and let arm swing freely. Repeat 10 times. Do 3  sessions per day.  Copyright  VHI. All rights reserved.  Pendulum Forward/Back    Bend forward 90 at waist, using table for support. Rock body forward and back to swing arm. Repeat 10 times. Do 3 sessions per day.  Copyright  VHI. All rights reserved.

## 2022-03-29 ENCOUNTER — Ambulatory Visit
Admission: RE | Admit: 2022-03-29 | Discharge: 2022-03-29 | Disposition: A | Discharge status: Home / Self Care | Payer: Medicaid Other | Source: Ambulatory Visit | Attending: Physical Medicine and Rehabilitation | Admitting: Physical Medicine and Rehabilitation

## 2022-03-29 DIAGNOSIS — M545 Low back pain, unspecified: Secondary | ICD-10-CM | POA: Diagnosis not present

## 2022-04-02 ENCOUNTER — Ambulatory Visit: Payer: Medicaid Other | Admitting: Occupational Therapy

## 2022-04-02 ENCOUNTER — Encounter: Payer: Self-pay | Admitting: Occupational Therapy

## 2022-04-02 DIAGNOSIS — M25511 Pain in right shoulder: Secondary | ICD-10-CM

## 2022-04-02 DIAGNOSIS — M25611 Stiffness of right shoulder, not elsewhere classified: Secondary | ICD-10-CM

## 2022-04-02 DIAGNOSIS — M6281 Muscle weakness (generalized): Secondary | ICD-10-CM | POA: Diagnosis not present

## 2022-04-02 NOTE — Therapy (Signed)
OUTPATIENT OCCUPATIONAL THERAPY ORTHO TREATMENT  Patient Name: [name] MRN: [id number] DOB:11-13-1960, 61 y.o., female Today's Date: 04/02/2022  PCP: Ladell Pier, MD REFERRING PROVIDER: Persons, Bevely Palmer, [place]   OT End of Session - 04/02/22 0848     Visit Number 3    Number of Visits 9    Date for OT Re-Evaluation 05/02/22    Authorization Type UHC Medicaid    Authorization Time Period VL: 68 combined    OT Start Time 0850    OT Stop Time 0930    OT Time Calculation (min) 40 min    Activity Tolerance Patient tolerated treatment well    Behavior During Therapy WFL for tasks assessed/performed              Past Medical History:  Diagnosis Date   Anxiety    Arthritis    Depression    GERD (gastroesophageal reflux disease)    Hyperlipidemia    Hypertension    Migraines    Thyroid disease    Past Surgical History:  Procedure Laterality Date   BREAST CYST EXCISION Right 1980   BREAST CYST EXCISION Left 1985   NO PAST SURGERIES     Patient Active Problem List   Diagnosis Date Noted   Adhesive capsulitis of right shoulder 03/14/2022   Influenza vaccine needed 10/31/2020   Need for diphtheria-tetanus-pertussis (Tdap) vaccine 10/31/2020   Hyperlipidemia associated with type 2 diabetes mellitus ([place]) 10/31/2020   Overweight (BMI 25.0-29.9) 10/31/2020   Type 2 diabetes mellitus without complication, without long-term current use of insulin ([place]) 07/31/2020   OSA (obstructive sleep apnea) 02/02/2020   Spondylosis without myelopathy or radiculopathy, lumbar region 09/17/2017   Essential hypertension 09/05/2017   Chronic bilateral low back pain with left-sided sciatica 09/05/2017   HTN (hypertension) 05/05/2017   Hypothyroidism 02/24/2017   Mixed hyperlipidemia 02/24/2017   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   Sleep related headaches 01/23/2017   Snoring 01/23/2017   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   Chronic headaches 10/30/2016   Obesity  (BMI 30.0-34.9) 05/24/2016   PMB (postmenopausal bleeding) 05/24/2016   Back pain of lumbar region with sciatica 09/28/2015    ONSET DATE: 03/14/2022   REFERRING DIAG: M75.01 (ICD-10-CM) - Adhesive capsulitis of right shoulder   THERAPY DIAG:  Stiffness of right shoulder, not elsewhere classified  Acute pain of right shoulder  Muscle weakness (generalized)  Rationale for Evaluation and Treatment Rehabilitation  SUBJECTIVE:   SUBJECTIVE STATEMENT: It was feeling better, but has gotten worse a week or so ago  Pt accompanied by: self  PERTINENT HISTORY: DM  PRECAUTIONS: Fall  WEIGHT BEARING RESTRICTIONS No  PAIN:  Are you having pain? Yes: NPRS scale: 5/10 Pain location: RUE shoulder Pain description: sharp Aggravating factors: movement Relieving factors: rest  PATIENT GOALS "hoping to get rid of this soreness so I can continue to do helping my son with the groceries and be able to lift something without going (winces)"  OBJECTIVE:   TODAY'S TREATMENT:   Ultrasound x 43mn to R lateral shoulder 353m, 1.0 wts/cm2, continuous with no adverse reactions for pain.  Supine: BUE sh flexion with ball (palms facing)  Kinesiotaping to relax middle deltoid and upper traps Rt shoulder  In prone, scapular retraction/shoulder ext with min facilitation.  Prone on elbows: chest lift for scapula depression and sh girdle strengthening/stability  Reinforced proper positioning and avoiding sh hiking and IR   PATIENT EDUCATION: Education details: kinesiotape wear and care Person educated: Patient Education  method: Explanation Education comprehension: verbalized understanding      HOME EXERCISE PROGRAM: 03/21/22 - HEP supine shoulder, standing shoulder extension 03/28/22:  reviewed HEP, added pendulum  GOALS: Goals reviewed with patient? Yes   LONG TERM GOALS: Target date: 05/02/2022    Pt will be independent with HEP targeting RUE ROM, gentle strengthening  Baseline:  initiated at eval Goal status: IN PROGRESS  2.  Pt will increase range of motion in RUE shoulder flexion by at least 10 degrees to improve ability to reach with pain equal to or less than 6/10.  Baseline: 7/10 pain, 135* Goal status: IN PROGRESS  3.  Pt will increase range of motion in internal rotation for increase in ability to reach behind back and complete bathing and toileting hygiene with decreased report of pain. Baseline:  Goal status: INITIAL  4.  Pt will report decreased pain and discomfort while assisting with carrying groceries. Baseline:  Goal status: INITIAL   ASSESSMENT:  CLINICAL IMPRESSION: Pt is progressing towards goals, she is able to return demo to HEP with min cueing to slow down and move within pain-free range with goal for stretch/movement.  Pt also needs cueing to avoid guarding patterns with RUE.  Noted elevated scapula on Rt side  PERFORMANCE DEFICITS in functional skills including ADLs, IADLs, ROM, strength, FMC, decreased knowledge of use of DME, and UE functional use, cognitive skills including attention, emotional, temperament/personality, and understand  IMPAIRMENTS are limiting patient from ADLs, IADLs, and leisure.   COMORBIDITIES has no other co-morbidities that affects occupational performance. Patient will benefit from skilled OT to address above impairments and improve overall function.  MODIFICATION OR ASSISTANCE TO COMPLETE EVALUATION: No modification of tasks or assist necessary to complete an evaluation.  OT OCCUPATIONAL PROFILE AND HISTORY: Problem focused assessment: Including review of records relating to presenting problem.  CLINICAL DECISION MAKING: LOW - limited treatment options, no task modification necessary  REHAB POTENTIAL: Good  EVALUATION COMPLEXITY: Low      PLAN: OT FREQUENCY: 2x/week  OT DURATION: 6 weeks 8 visits over 6 weeks d/t any scheduling conflicts  PLANNED INTERVENTIONS: self care/ADL training, therapeutic  exercise, therapeutic activity, passive range of motion, ultrasound, moist heat, and DME and/or AE instructions  RECOMMENDED OTHER SERVICES: pt getting PT for low back pain at [place]: Patient  PLAN FOR NEXT SESSION:  Assess kinesiotape and reapply if helped, continue proper positioning reaching RUE, continue [place] prn  Hans Eden, OT 04/02/2022, 8:48 AM

## 2022-04-03 ENCOUNTER — Ambulatory Visit: Payer: Medicaid Other

## 2022-04-03 DIAGNOSIS — G8929 Other chronic pain: Secondary | ICD-10-CM

## 2022-04-03 DIAGNOSIS — M6281 Muscle weakness (generalized): Secondary | ICD-10-CM

## 2022-04-03 NOTE — Therapy (Addendum)
OUTPATIENT PHYSICAL THERAPY TREATMENT NOTE/ RE-EVALUATION/ DISCHARGE SUMMARY   Patient Name: [name] MRN: [id number] DOB:Dec 07, 1960, 61 y.o., female Today's Date: 04/03/2022  PCP: Ladell Pier, MD REFERRING PROVIDER: Ladell Pier, MD   PT End of Session - 04/03/22 0956     Visit Number 8    Number of Visits 11    Date for PT Re-Evaluation 04/11/22    Authorization Type [employer]    Authorization - Visit Number 8    Authorization - Number of Visits 3    PT Start Time [phone]    PT Stop Time 1040    PT Time Calculation (min) 42 min    Activity Tolerance Patient tolerated treatment well    Behavior During Therapy WFL for tasks assessed/performed                Past Medical History:  Diagnosis Date   Anxiety    Arthritis    Depression    GERD (gastroesophageal reflux disease)    Hyperlipidemia    Hypertension    Migraines    Thyroid disease    Past Surgical History:  Procedure Laterality Date   BREAST CYST EXCISION Right 1980   BREAST CYST EXCISION Left 1985   NO PAST SURGERIES     Patient Active Problem List   Diagnosis Date Noted   Adhesive capsulitis of right shoulder 03/14/2022   Influenza vaccine needed 10/31/2020   Need for diphtheria-tetanus-pertussis (Tdap) vaccine 10/31/2020   Hyperlipidemia associated with type 2 diabetes mellitus ([place]) 10/31/2020   Overweight (BMI 25.0-29.9) 10/31/2020   Type 2 diabetes mellitus without complication, without long-term current use of insulin ([place]) 07/31/2020   OSA (obstructive sleep apnea) 02/02/2020   Spondylosis without myelopathy or radiculopathy, lumbar region 09/17/2017   Essential hypertension 09/05/2017   Chronic bilateral low back pain with left-sided sciatica 09/05/2017   HTN (hypertension) 05/05/2017   Hypothyroidism 02/24/2017   Mixed hyperlipidemia 02/24/2017   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   Sleep related headaches 01/23/2017   Snoring 01/23/2017   Insomnia due to mental  condition 01/23/2017   Chronic headaches 10/30/2016   Obesity (BMI 30.0-34.9) 05/24/2016   PMB (postmenopausal bleeding) 05/24/2016   Back pain of lumbar region with sciatica 09/28/2015   PCP: Ladell Pier, MD   REFERRING PROVIDER: Persons, Bevely Palmer, PA  THERAPY DIAG:  Chronic bilateral low back pain, unspecified whether sciatica present  Muscle weakness (generalized)  PERTINENT HISTORY: DM   PRECAUTIONS: None  SUBJECTIVE:   Pt reports no pain currently, adding that her pain comes and goes. She reports daily adherence to her HEP.  PAIN:  Are you having pain?  Yes: NPRS scale: 0/10  Pain location: Lower back, bilateral Pain description: Tightening Aggravating factors: Standing, walking, sitting extended periods Relieving factors: Medication      OBJECTIVE:  *Unless otherwise noted, objective information collected previously*  DIAGNOSTIC FINDINGS:  Lumbar X-ray 12/21/2021: lumbar spine demonstrate well-maintained alignment very similar x-rays to previous she does have some calcification within the aorta but no different than her previous x-rays.  Good spacing no listhesis that I can appreciate minor spurring of the endplates.   PATIENT SURVEYS:  Modified Oswestry 33/50, 66% 02/21/2022: 24/50, 48%   SCREENING FOR RED FLAGS: Negative   COGNITION: Overall cognitive status: Within functional limits for tasks assessed                          SENSATION: [facility]   MUSCLE  LENGTH: Hamstring, hip flexor, and quad flexibility limitation   POSTURE:  Rounded shoulder and forward head posture,    PALPATION: Mildly tender to palpation bilateral paraspinals   LUMBAR ROM:    Active  A/ROM  01/02/2022 AROM 02/21/2022  Flexion 75% 100%  Extension 75% 100%  Right lateral flexion 75% 100%  Left lateral flexion 75% 100%  Right rotation 75% 75%  Left rotation 75% 100%  Patient denies any change in symptoms with active motion   LE ROM:                       HIP PROM  grossly WFL and non-painful   LE MMT:   MMT Right 01/02/2022 Left 01/02/2022 Right  02/13/22 Left 02/13/22 Right 02/21/2022 Left 02/21/2022 Right 04/03/2022 Left 04/03/2022  Core (DLLT) 3        Hip flexion 4 4   5/5 5/5    Hip extension 3 3-   3/5 3/5 4+/5 4+/5  Hip abduction 3 3- 4/5 4- 4-/5 4-/5 4+/5 4+/5  Knee flexion 5 5        Knee extension 5 5          LUMBAR SPECIAL TESTS:  Lumbar radicular testing negative   GAIT: Assistive device utilized: None Level of assistance: Complete Independence Comments: decreased gait speed, compensated trendelenburg bilaterally     TODAY'S TREATMENT   [facility] Adult PT Treatment:                                                DATE: 04/03/2022 Therapeutic Exercise: Straight arm reverse crunches 3x10 Supine with lower legs off edge of table, BIL hip flexion with slow, eccentric lowering with 4# ankle weights 3x8 Standing trunk side bend with 15# kettlebell 2x20 BIL Sidelying lumbar open book x10 BIL Standing Pallof press with black band 2x10 with 5-sec hold BIL Standing abdominal press-down with 17# cable 2x12 Manual Therapy: N/A Neuromuscular re-ed: N/A Therapeutic Activity: N/A Modalities: N/A Self Care: N/A   [facility] Adult PT Treatment:                                                DATE: 03/27/2022 Therapeutic Exercise: nu-step L5 84mwhile taking subjective and planning session with patient Supine 90/90 abdominal isometric with handhold resistance 4x30sec (NT) SKTC 3x30sec Thomas stretch + SKTC x231m BIL Bridge emphasizing hip ext - 3x10 Side clam w/ YTB 2x10 BIL Bird dog - 2x10   [facility] Adult PT Treatment:                                                DATE: 03/07/2022 Therapeutic Exercise: Supine 90/90 abdominal isometric with handhold resistance 4x30sec DKTC 2x30sec Thomas stretch [id number] Side knee plank with clams 2x10 BIL Seated Russian twist 3x20 Standing hip extension with 7# cable to ankle at Free Motion 2x10  BIL Standing hip abduction with 7# cable to ankle at Free Motion 2x10 BIL Manual Therapy: N/A Neuromuscular re-ed: N/A Therapeutic Activity: N/A Modalities: N/A Self Care: N/A  PATIENT EDUCATION:  Education details: Exam findings, POC, HEP Person educated: Patient Education method: Explanation, Demonstration, Tactile cues, Verbal cues, and Handouts Education comprehension: verbalized understanding, returned demonstration, verbal cues required, tactile cues required, and needs further education   HOME EXERCISE PROGRAM: Access Code: [email or web address] URL: [email or web address] Date: 02/13/2022 Prepared by: Hessie Diener  Exercises - Supine Piriformis Stretch with Foot on Ground  - 2 x daily - 3 reps - 20 seconds hold - Supine Lower Trunk Rotation  - 2 x daily - 5 reps - 5 seconds hold - Seated Hamstring Stretch  - 2 x daily - 3 reps - 20 seconds hold - Bridge  - 1 x daily - 2 sets - 10 reps - 3 seconds hold - Straight Leg Raise  - 1 x daily - 2 sets - 10 reps - Clam with Resistance  - 1 x daily - 2 sets - 10 reps - Modified Thomas Stretch  - 1 x daily - 7 x weekly - 1 sets - 3 reps - 30 hold - Supine Bilateral Isometric Hip Flexion  - 1 x daily - 7 x weekly - 1 sets - 3 reps - 30 hold - Side Plank on Knees  - 1 x daily - 7 x weekly - 1 sets - 5 reps - 10-30 hold - Sidelying Hip Abduction  - 1 x daily - 7 x weekly - 2-3 sets - 10 reps       ASSESSMENT: CLINICAL IMPRESSION: Pt responded well to all interventions today, demonstrating good form and no increase in pain with progressed exercises. Upon re-assessment of hip strength, the pt has made excellent progress in BIL hip extension and abduction MMT. She will continue to benefit from skilled PT to address her primary impairments and return to her prior level of function with less limitation.     OBJECTIVE IMPAIRMENTS Abnormal gait, decreased activity tolerance, decreased ROM, decreased strength, impaired  flexibility, postural dysfunction, and pain.    ACTIVITY LIMITATIONS community activity, occupation, and shopping.    PERSONAL FACTORS Fitness, Past/current experiences, and Time since onset of injury/illness/exacerbation are also affecting patient's functional outcome.          GOALS: Goals reviewed with patient? Yes   SHORT TERM GOALS: Target date: 01/30/2022   Patient will be I with initial HEP in order to progress with therapy. Baseline: HEP provided at evaluation 02/21/2022: Pt reports daily adherence to her HEP Goal status: ACHIEVED   2.  Patient will report pain level no more than 6/10 with activity in order to reduce functional limitations. Baseline: patient reports pain up to 11/10 02/21/2022: 5/10 Goal status: ACHIEVED   LONG TERM GOALS: Target date: 04/11/2022 (extended)   Patient will be I with final HEP to maintain progress from PT. Baseline: HEP provided at evaluation 02/21/2022: Pt reports daily adherence to an updated HEP Goal status: ACHIEVED   2.  Patient will report Modified ODI </= 20/50 in order to indicate improved functional ability Baseline: Modified ODI 33/50 02/21/2022: 24/50 Goal status: PROGRESSING   3.  Patient will be able to walk >/= 45 minutes in order to walk to grocery store. Baseline: patient reports increased low back pain with walking 15 minutes and need to stop and rest 02/21/2022: Pt reports being able to walk about 30 minutes before needing a seated rest Goal status: PROGRESSING   4.  Patient will demonstrate core and hip strength grossly >/= 4-/5 MMT in order to improve standing and walking tolerance Baseline: grossly 3/5  MMT 02/21/2022: See updated MMT chart 04/03/2022: See updated MMT chart Goal status: ACHIEVED     PLAN: PT FREQUENCY: 1x/week   PT DURATION: 6 weeks   PLANNED INTERVENTIONS: Therapeutic exercises, Therapeutic activity, Neuromuscular re-education, Balance training, Gait training, Patient/Family education, Joint  manipulation, Joint mobilization, Aquatic Therapy, Dry Needling, Electrical stimulation, Spinal manipulation, Spinal mobilization, Cryotherapy, Moist heat, and Manual therapy.   PLAN FOR NEXT SESSION: Review HEP and progress PRN, stretching for hip flexor/quad/hamstring/lumbar, progress strengthening for hip and core, initiate lifting for strength training     Vanessa Gallatin, PT, DPT 04/03/22 10:40 AM  PHYSICAL THERAPY DISCHARGE SUMMARY  Visits from Start of Care: 8  Current functional level related to goals / functional outcomes: Pt made good improvements in pain levels, ODI, hip and core strength, and functional walking ability.   Remaining deficits: Pt maintains mild limitations in each of the above metrics.   Education / Equipment: HEP   Patient agrees to discharge. Patient goals were partially met. Patient is being discharged due to not returning since the last visit.  Vanessa Golf, PT, DPT 07/24/22 12:34 PM

## 2022-04-08 ENCOUNTER — Ambulatory Visit: Payer: Medicaid Other | Admitting: Occupational Therapy

## 2022-04-09 ENCOUNTER — Encounter: Payer: Self-pay | Admitting: Physical Medicine and Rehabilitation

## 2022-04-09 ENCOUNTER — Ambulatory Visit (INDEPENDENT_AMBULATORY_CARE_PROVIDER_SITE_OTHER): Payer: Medicaid Other | Admitting: Physical Medicine and Rehabilitation

## 2022-04-09 VITALS — BP 100/65 | HR 75

## 2022-04-09 DIAGNOSIS — G8929 Other chronic pain: Secondary | ICD-10-CM | POA: Diagnosis not present

## 2022-04-09 DIAGNOSIS — M7918 Myalgia, other site: Secondary | ICD-10-CM

## 2022-04-09 DIAGNOSIS — M545 Low back pain, unspecified: Secondary | ICD-10-CM

## 2022-04-09 DIAGNOSIS — M797 Fibromyalgia: Secondary | ICD-10-CM | POA: Diagnosis not present

## 2022-04-09 DIAGNOSIS — M546 Pain in thoracic spine: Secondary | ICD-10-CM

## 2022-04-09 NOTE — Progress Notes (Unsigned)
[name] - 61 y.o. female MRN [id number]  Date of birth: 1961/08/07  Office Visit Note: Visit Date: 04/09/2022 PCP: Ladell Pier, MD Referred by: Ladell Pier, MD  Subjective: Chief Complaint  Patient presents with   Lower Back - Pain   Left Leg - Pain   HPI: [name] is a 61 y.o. female who comes in today for evaluation of chronic, worsening and severe bilateral lower back pain radiating up to thoracic back and intermittently down entire left leg to foot. Patient reports pain has been ongoing for several years and worsens with movement, activity and standing. She reports some relief of pain with home exercise regimen, rest and use of medications. Patient has completed multiple sessions of formal physical therapy at [facility] with minimal relief of pain. Patient has also tried multiple medications such as Celebrex and Cymbalta, she reports minimal relief of pain with these medications. Patient's recent lumbar MRI exhibits stable mild degenerative changes when compared to 2018. Small left inferior foraminal protrusion at the levels of L2-L3 and L3-L4. No high grade spinal canal stenosis noted. Her lumbar MRI looks good for her age. Patient was initially seen in our office in 2018, was referred by Fredia Beets, [place]. She has had multiple lumbar injections in our office over the years including transforaminal, facet joint blocks and radiofrequency ablation. Patient reports no relief of pain with these procedures. Patient reports she continues to have pain on a daily basis and feels that no treatments have helped to alleviate her pain. Patient denies focal weakness, numbness and tingling. Patient denies recent trauma or falls.    Review of Systems  Musculoskeletal:  Positive for back pain and myalgias.  Neurological:  Negative for tingling, sensory change, focal weakness and weakness.  All other systems reviewed and are negative.   Otherwise per HPI.  Assessment & Plan: Visit Diagnoses:    ICD-10-CM   1. Chronic bilateral low back pain without sciatica  M54.50    G89.29     2. Chronic bilateral thoracic back pain  M54.6    G89.29     3. Myofascial pain syndrome  M79.18     4. Fibromyalgia  M79.7        Plan: Findings:  Chronic, worsening and severe bilateral lower back pain radiating up to thoracic back and intermittently down entire left leg to foot. Patient continues to have severe pain despite good conservative therapies such as formal physical therapy, home exercise regimen, rest and use of medications. Patient's clinical presentation and exam do not directly correlate with recent lumbar MRI imaging. Differentials could include myofascial pain vs fibromyalgia. I did have patient complete widespread pain index and she does meet diagnostic criteria for fibromyalgia. Patient has had minimal relief with lumbar injections, medications and physical therapy. We do not feel repeating lumbar injections at this time would be beneficial. At this point, I recommend patient follow up with primary care provider to discuss management of fibromyalgia. I did emphasize the importance of good sleep hygiene, consistent exercise regimen, and stress reduction. Patient instructed to follow up with [place] as needed. No red flag symptoms noted upon exam today.     Meds & Orders: No orders of the defined types were placed in this encounter.  No orders of the defined types were placed in this encounter.   Follow-up: Return if symptoms worsen or fail to improve.   Procedures: No procedures performed      Clinical  History: EXAM: MRI LUMBAR SPINE WITHOUT CONTRAST   TECHNIQUE: Multiplanar, multisequence MR imaging of the lumbar spine was performed. No intravenous contrast was administered.   COMPARISON:  05/16/2017   FINDINGS: Segmentation:  Standard.   Alignment:  Physiologic.   Vertebrae:  No fracture, evidence of discitis, or  bone lesion.   Conus medullaris and cauda equina: Conus extends to the L1 level. Conus and cauda equina appear normal.   Paraspinal and other soft tissues: Negative for perispinal mass or inflammation   Disc levels:   T12- L1: Unremarkable.   L1-L2: Unremarkable.   L2-L3: Small left inferior foraminal protrusion. No change or neural impingement   L3-L4: Small left inferior foraminal protrusion. Minor disc bulging. No change or neural impingement.   L4-L5: Minor annulus bulging   L5-S1:Minor annulus bulging   IMPRESSION: Stable mild degenerative changes when compared to 2018. No neural impingement     Electronically Signed   By: Jorje Guild M.D.   On: 03/31/2022 04:09   She reports that she has never smoked. She has never used smokeless tobacco.  Recent Labs    04/20/21 1015 08/28/21 0839 12/27/21 1550  HGBA1C 5.5 5.8 6.4*    Objective:  VS:  HT:    WT:   BMI:     BP:100/65  HR:75bpm  TEMP: ( )  RESP:  Physical Exam Vitals and nursing note reviewed.  HENT:     Head: Normocephalic and atraumatic.     Right Ear: External ear normal.     Left Ear: External ear normal.     Nose: Nose normal.     Mouth/Throat:     Mouth: Mucous membranes are moist.  Eyes:     Extraocular Movements: Extraocular movements intact.  Cardiovascular:     Rate and Rhythm: Normal rate.     Pulses: Normal pulses.  Pulmonary:     Effort: Pulmonary effort is normal.  Abdominal:     General: Abdomen is flat. There is no distension.  Musculoskeletal:        General: Tenderness present.     Cervical back: Normal range of motion.     Comments: No discomfort noted with flexion, extension and side-to-side rotation. Patient has good strength in the upper extremities including 5 out of 5 strength in wrist extension, long finger flexion and APB.  There is no atrophy of the hands intrinsically. Tenderness noted upon palpation of bilateral thoracic paraspinal regions. Sensation intact  bilaterally  Skin:    General: Skin is warm and dry.     Capillary Refill: Capillary refill takes less than 2 seconds.  Neurological:     General: No focal deficit present.     Mental Status: She is alert and oriented to person, place, and time.  Psychiatric:        Mood and Affect: Mood normal.        Behavior: Behavior normal.     Ortho Exam  Imaging: No results found.  Past Medical/Family/Surgical/Social History: Medications & Allergies reviewed per EMR, new medications updated. Patient Active Problem List   Diagnosis Date Noted   Adhesive capsulitis of right shoulder 03/14/2022   Influenza vaccine needed 10/31/2020   Need for diphtheria-tetanus-pertussis (Tdap) vaccine 10/31/2020   Hyperlipidemia associated with type 2 diabetes mellitus ([place]) 10/31/2020   Overweight (BMI 25.0-29.9) 10/31/2020   Type 2 diabetes mellitus without complication, without long-term current use of insulin ([place]) 07/31/2020   OSA (obstructive sleep apnea) 02/02/2020   Spondylosis without myelopathy or radiculopathy, lumbar  region 09/17/2017   Essential hypertension 09/05/2017   Chronic bilateral low back pain with left-sided sciatica 09/05/2017   HTN (hypertension) 05/05/2017   Hypothyroidism 02/24/2017   Mixed hyperlipidemia 02/24/2017   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   Sleep related headaches 01/23/2017   Snoring 01/23/2017   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   Chronic headaches 10/30/2016   Obesity (BMI 30.0-34.9) 05/24/2016   PMB (postmenopausal bleeding) 05/24/2016   Back pain of lumbar region with sciatica 09/28/2015   Past Medical History:  Diagnosis Date   Anxiety    Arthritis    Depression    GERD (gastroesophageal reflux disease)    Hyperlipidemia    Hypertension    Migraines    Thyroid disease    Family History  Problem Relation Age of Onset   Headache Neg Hx    Colon cancer Neg Hx    Esophageal cancer Neg Hx    Rectal cancer Neg Hx    Stomach cancer Neg  Hx    Breast cancer Neg Hx    Past Surgical History:  Procedure Laterality Date   BREAST CYST EXCISION Right 1980   BREAST CYST EXCISION Left 1985   NO PAST SURGERIES     Social History   Occupational History   Occupation: Unemployed  Tobacco Use   Smoking status: Never   Smokeless tobacco: Never  Substance and Sexual Activity   Alcohol use: No    Alcohol/week: 0.0 standard drinks of alcohol   Drug use: No   Sexual activity: Not Currently    Partners: Male

## 2022-04-09 NOTE — Progress Notes (Signed)
Pt state lower back pain that travels to her left leg. Pt state bending, standing and sitting makes the pain worse. Pt state she takes over the counter pain meds to help ease her pain.  Numeric Pain Rating Scale and Functional Assessment Average Pain 10 Pain Right Now 8 My pain is intermittent, dull, stabbing, and aching Pain is worse with: bending, sitting, standing, and some activites Pain improves with: rest, medication, and injections   In the last MONTH (on 0-10 scale) has pain interfered with the following?  1. General activity like being  able to carry out your everyday physical activities such as walking, climbing stairs, carrying groceries, or moving a chair?  Rating(5)  2. Relation with others like being able to carry out your usual social activities and roles such as  activities at home, at work and in your community. Rating(6)  3. Enjoyment of life such that you have  been bothered by emotional problems such as feeling anxious, depressed or irritable?  Rating(7)

## 2022-04-10 ENCOUNTER — Ambulatory Visit: Payer: Medicaid Other | Admitting: Physical Therapy

## 2022-04-12 ENCOUNTER — Ambulatory Visit: Payer: Medicaid Other | Admitting: Occupational Therapy

## 2022-04-16 ENCOUNTER — Encounter: Payer: Self-pay | Admitting: Orthopaedic Surgery

## 2022-04-16 ENCOUNTER — Ambulatory Visit (INDEPENDENT_AMBULATORY_CARE_PROVIDER_SITE_OTHER): Payer: Medicaid Other | Admitting: Orthopaedic Surgery

## 2022-04-16 ENCOUNTER — Ambulatory Visit: Payer: Medicaid Other | Admitting: Occupational Therapy

## 2022-04-16 DIAGNOSIS — M7501 Adhesive capsulitis of right shoulder: Secondary | ICD-10-CM

## 2022-04-18 ENCOUNTER — Other Ambulatory Visit: Payer: Self-pay | Admitting: Physical Medicine and Rehabilitation

## 2022-04-18 ENCOUNTER — Encounter: Payer: Self-pay | Admitting: Occupational Therapy

## 2022-04-18 ENCOUNTER — Ambulatory Visit: Payer: Medicaid Other | Admitting: Occupational Therapy

## 2022-04-18 DIAGNOSIS — M6281 Muscle weakness (generalized): Secondary | ICD-10-CM

## 2022-04-18 DIAGNOSIS — M25511 Pain in right shoulder: Secondary | ICD-10-CM

## 2022-04-18 DIAGNOSIS — M25611 Stiffness of right shoulder, not elsewhere classified: Secondary | ICD-10-CM

## 2022-04-18 NOTE — Therapy (Signed)
OUTPATIENT OCCUPATIONAL THERAPY ORTHO TREATMENT & DISCHARGE  Patient Name: [name] MRN: [id number] DOB:Oct 03, 1961, 61 y.o., female Today's Date: 04/18/2022  PCP: Ladell Pier, MD REFERRING PROVIDER: Persons, Bevely Palmer, [place]   OT End of Session - 04/18/22 0913     Visit Number 4    Number of Visits 9    Date for OT Re-Evaluation 05/02/22    Authorization Type [employer] Medicaid    Authorization Time Period VL: 57 combined    OT Start Time 0845    OT Stop Time 0912   d/c session   OT Time Calculation (min) 27 min    Activity Tolerance Patient tolerated treatment well    Behavior During Therapy WFL for tasks assessed/performed               Past Medical History:  Diagnosis Date   Anxiety    Arthritis    Depression    GERD (gastroesophageal reflux disease)    Hyperlipidemia    Hypertension    Migraines    Thyroid disease    Past Surgical History:  Procedure Laterality Date   BREAST CYST EXCISION Right 1980   BREAST CYST EXCISION Left 1985   NO PAST SURGERIES     Patient Active Problem List   Diagnosis Date Noted   Adhesive capsulitis of right shoulder 03/14/2022   Influenza vaccine needed 10/31/2020   Need for diphtheria-tetanus-pertussis (Tdap) vaccine 10/31/2020   Hyperlipidemia associated with type 2 diabetes mellitus ([place]) 10/31/2020   Overweight (BMI 25.0-29.9) 10/31/2020   Type 2 diabetes mellitus without complication, without long-term current use of insulin ([place]) 07/31/2020   OSA (obstructive sleep apnea) 02/02/2020   Spondylosis without myelopathy or radiculopathy, lumbar region 09/17/2017   Essential hypertension 09/05/2017   Chronic bilateral low back pain with left-sided sciatica 09/05/2017   HTN (hypertension) 05/05/2017   Hypothyroidism 02/24/2017   Mixed hyperlipidemia 02/24/2017   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   Sleep related headaches 01/23/2017   Snoring 01/23/2017   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   Chronic  headaches 10/30/2016   Obesity (BMI 30.0-34.9) 05/24/2016   PMB (postmenopausal bleeding) 05/24/2016   Back pain of lumbar region with sciatica 09/28/2015    ONSET DATE: 03/14/2022   REFERRING DIAG: M75.01 (ICD-10-CM) - Adhesive capsulitis of right shoulder   THERAPY DIAG:  Muscle weakness (generalized)  Stiffness of right shoulder, not elsewhere classified  Acute pain of right shoulder  Rationale for Evaluation and Treatment Rehabilitation  SUBJECTIVE:   SUBJECTIVE STATEMENT: "I was diagnosed with fibromyalgia in my back"  Pt accompanied by: self  PERTINENT HISTORY: DM  PRECAUTIONS: Fall  WEIGHT BEARING RESTRICTIONS No  PAIN:  Are you having pain? Yes: NPRS scale: 4/10 Pain location: RUE shoulder Pain description: sharp Aggravating factors: movement, internal rotation (reaching behind back) Relieving factors: rest  PATIENT GOALS "hoping to get rid of this soreness so I can continue to do helping my son with the groceries and be able to lift something without going (winces)"  OBJECTIVE:   TODAY'S TREATMENT:   Ultrasound x 102mn to R lateral shoulder 368m, 1.0 wts/cm2, continuous with no adverse reactions for pain.  Reviewed goals and instructed on continuing HEPs for gentle ROM - hold off on strengthening for now. Pt verbalized understanding.   OCCUPATIONAL THERAPY DISCHARGE SUMMARY  Visits from Start of Care: 4  Current functional level related to goals / functional outcomes: Pt with decrease in some pain and reports good understanding of activity modifications and HEPS  Remaining deficits: Chronic pain in shoulder and back   Education / Equipment: HEPs   Patient agrees to discharge. Patient goals were partially met. Patient is being discharged due to maximized rehab potential. .     HOME EXERCISE PROGRAM: 03/21/22 - HEP supine shoulder, standing shoulder extension 03/28/22:  reviewed HEP, added pendulum  GOALS: Goals reviewed with patient?  Yes   LONG TERM GOALS: Target date: 05/02/2022    Pt will be independent with HEP targeting RUE ROM, gentle strengthening  Baseline: initiated at eval Goal status: MET  2.  Pt will increase range of motion in RUE shoulder flexion by at least 10 degrees to improve ability to reach with pain equal to or less than 6/10.  Baseline: 7/10 pain, 135* Goal status: NOT MET  3.  Pt will increase range of motion in internal rotation for increase in ability to reach behind back and complete bathing and toileting hygiene with decreased report of pain. Baseline:  Goal status: MET reports some increase in skill however continues to have difficulty  4.  Pt will report decreased pain and discomfort while assisting with carrying groceries. Baseline:  Goal status: NOT MET reports still carrying with LUE   ASSESSMENT:  CLINICAL IMPRESSION: Pt met 2/4 goals and is discharging from OT at this time.  PERFORMANCE DEFICITS in functional skills including ADLs, IADLs, ROM, strength, FMC, decreased knowledge of use of DME, and UE functional use, cognitive skills including attention, emotional, temperament/personality, and understand  IMPAIRMENTS are limiting patient from ADLs, IADLs, and leisure.   COMORBIDITIES has no other co-morbidities that affects occupational performance. Patient will benefit from skilled OT to address above impairments and improve overall function.  MODIFICATION OR ASSISTANCE TO COMPLETE EVALUATION: No modification of tasks or assist necessary to complete an evaluation.  OT OCCUPATIONAL PROFILE AND HISTORY: Problem focused assessment: Including review of records relating to presenting problem.  CLINICAL DECISION MAKING: LOW - limited treatment options, no task modification necessary  REHAB POTENTIAL: Good  EVALUATION COMPLEXITY: Low      PLAN: OT FREQUENCY: 2x/week  OT DURATION: 6 weeks 8 visits over 6 weeks d/t any scheduling conflicts  PLANNED INTERVENTIONS: self  care/ADL training, therapeutic exercise, therapeutic activity, passive range of motion, ultrasound, moist heat, and DME and/or AE instructions  RECOMMENDED OTHER SERVICES: pt getting PT for low back pain at [facility]  CONSULTED AND AGREED [place]: Patient  PLAN FOR NEXT SESSION:  OT d/c  Zachery Conch, OT 04/18/2022, 9:13 AM

## 2022-04-24 ENCOUNTER — Other Ambulatory Visit: Payer: Self-pay | Admitting: Internal Medicine

## 2022-04-24 DIAGNOSIS — E785 Hyperlipidemia, unspecified: Secondary | ICD-10-CM

## 2022-04-25 DIAGNOSIS — F330 Major depressive disorder, recurrent, mild: Secondary | ICD-10-CM | POA: Diagnosis not present

## 2022-04-29 ENCOUNTER — Ambulatory Visit: Payer: Medicaid Other | Admitting: Internal Medicine

## 2022-04-30 ENCOUNTER — Telehealth: Payer: Self-pay | Admitting: Internal Medicine

## 2022-04-30 NOTE — Telephone Encounter (Signed)
..  Patient declines further follow up and engagement by the Managed Medicaid Team. Appropriate care team members and provider have been notified via electronic communication. The Managed Medicaid Team is available to follow up with the patient after provider conversation with the patient regarding recommendation for engagement and subsequent re-referral to the Managed Medicaid Team.    Jennifer Alley Care Guide, High Risk Medicaid Managed Care Embedded Care Coordination [place]  [facility]   

## 2022-05-02 ENCOUNTER — Other Ambulatory Visit: Payer: Self-pay | Admitting: Internal Medicine

## 2022-05-06 ENCOUNTER — Other Ambulatory Visit: Payer: Self-pay | Admitting: Internal Medicine

## 2022-05-06 DIAGNOSIS — I10 Essential (primary) hypertension: Secondary | ICD-10-CM

## 2022-05-15 DIAGNOSIS — F330 Major depressive disorder, recurrent, mild: Secondary | ICD-10-CM | POA: Diagnosis not present

## 2022-05-16 ENCOUNTER — Encounter: Payer: Self-pay | Admitting: Orthopaedic Surgery

## 2022-05-16 ENCOUNTER — Ambulatory Visit (INDEPENDENT_AMBULATORY_CARE_PROVIDER_SITE_OTHER): Payer: Medicaid Other | Admitting: Orthopaedic Surgery

## 2022-05-16 DIAGNOSIS — M75121 Complete rotator cuff tear or rupture of right shoulder, not specified as traumatic: Secondary | ICD-10-CM

## 2022-05-16 DIAGNOSIS — M7501 Adhesive capsulitis of right shoulder: Secondary | ICD-10-CM

## 2022-05-16 NOTE — Progress Notes (Signed)
Office Visit Note   Patient: [name]           Date of Birth: [birth year]           MRN: [id number] Visit Date: 05/16/2022              Requested by: Ladell Pier, MD [address] [place],  [place] [address] PCP: Ladell Pier, MD   Assessment & Plan: Visit Diagnoses:  1. Adhesive capsulitis of right shoulder   2. Nontraumatic complete tear of right rotator cuff     Plan: [name] is a pleasant 61 year old woman who follows up today for her right shoulder.  She has had ongoing problems with the right shoulder for almost a year.  She has been diagnosed with adhesive capsulitis.  She has been doing extensive physical therapy and at her last visit seem to have less pain and improved motion.  Unfortunately 2 weeks ago she picked up a grocery sack that she did not realize had a 10 pound bag of sugar on it.  Her arm released and went to the ground.  Since then she has had increased pain and decreased motion.  Because of the ongoing problems she is having and recurrence of her painful symptoms despite physical therapy we recommend an MRI of her right shoulder she will follow-up after that.  She understands that discussion may be had about doing a manipulation under anesthesia or addressing any other pathology in the shoulder arthroscopically  Follow-Up Instructions: Return With Dr. Durward Fortes after MRI.   Orders:  Orders Placed This Encounter  Procedures   MR SHOULDER RIGHT WO CONTRAST   No orders of the defined types were placed in this encounter.     Procedures: No procedures performed   Clinical Data: No additional findings.   Subjective: Chief Complaint  Patient presents with   Right Shoulder - Follow-up   Patient presents today for follow up of her right shoulder pain. She states that she has been having increased pain while she has been having movement to her right shoulder. OTC tylenol is being used for pain which has been helping slightly.  Patient states that she has diagnosed with fibromyalgia in her back.  Review of Systems  All other systems reviewed and are negative.    Objective: Vital Signs: LMP 06/06/2015   Physical Exam Constitutional:      Appearance: Normal appearance.  Pulmonary:     Effort: Pulmonary effort is normal.  Skin:    General: Skin is warm and dry.  Neurological:     Mental Status: She is alert.     Ortho Exam Patient is comfortable sitting in the chair appropriate for exam.  Examination of the right arm sensation is intact.  She has decreased movement from her previous exam and only elevates to about 95 degrees before it is tight and too painful for her.  External rotation is also quite stiff.  She has trouble internally rotating her arm to even do a drop can test.  She is able to hold her arm up though not all the way because of pain and stiffness independently. Specialty Comments:  EXAM: MRI LUMBAR SPINE WITHOUT CONTRAST   TECHNIQUE: Multiplanar, multisequence MR imaging of the lumbar spine was performed. No intravenous contrast was administered.   COMPARISON:  05/16/2017   FINDINGS: Segmentation:  Standard.   Alignment:  Physiologic.   Vertebrae:  No fracture, evidence of discitis, or bone lesion.   Conus  medullaris and cauda equina: Conus extends to the L1 level. Conus and cauda equina appear normal.   Paraspinal and other soft tissues: Negative for perispinal mass or inflammation   Disc levels:   T12- L1: Unremarkable.   L1-L2: Unremarkable.   L2-L3: Small left inferior foraminal protrusion. No change or neural impingement   L3-L4: Small left inferior foraminal protrusion. Minor disc bulging. No change or neural impingement.   L4-L5: Minor annulus bulging   L5-S1:Minor annulus bulging   IMPRESSION: Stable mild degenerative changes when compared to 2018. No neural impingement     Electronically Signed   By: Jorje Guild M.D.   On: 03/31/2022  04:09  Imaging: No results found.   PMFS History: Patient Active Problem List   Diagnosis Date Noted   Complete tear of right rotator cuff 05/16/2022   Adhesive capsulitis of right shoulder 03/14/2022   Influenza vaccine needed 10/31/2020   Need for diphtheria-tetanus-pertussis (Tdap) vaccine 10/31/2020   Hyperlipidemia associated with type 2 diabetes mellitus ([place]) 10/31/2020   Overweight (BMI 25.0-29.9) 10/31/2020   Type 2 diabetes mellitus without complication, without long-term current use of insulin ([place]) 07/31/2020   OSA (obstructive sleep apnea) 02/02/2020   Spondylosis without myelopathy or radiculopathy, lumbar region 09/17/2017   Essential hypertension 09/05/2017   Chronic bilateral low back pain with left-sided sciatica 09/05/2017   HTN (hypertension) 05/05/2017   Hypothyroidism 02/24/2017   Mixed hyperlipidemia 02/24/2017   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   Sleep related headaches 01/23/2017   Snoring 01/23/2017   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   Chronic headaches 10/30/2016   Obesity (BMI 30.0-34.9) 05/24/2016   PMB (postmenopausal bleeding) 05/24/2016   Back pain of lumbar region with sciatica 09/28/2015   Past Medical History:  Diagnosis Date   Anxiety    Arthritis    Depression    GERD (gastroesophageal reflux disease)    Hyperlipidemia    Hypertension    Migraines    Thyroid disease     Family History  Problem Relation Age of Onset   Headache Neg Hx    Colon cancer Neg Hx    Esophageal cancer Neg Hx    Rectal cancer Neg Hx    Stomach cancer Neg Hx    Breast cancer Neg Hx     Past Surgical History:  Procedure Laterality Date   BREAST CYST EXCISION Right 1980   BREAST CYST EXCISION Left 1985   NO PAST SURGERIES     Social History   Occupational History   Occupation: Unemployed  Tobacco Use   Smoking status: Never   Smokeless tobacco: Never  Substance and Sexual Activity   Alcohol use: No    Alcohol/week: 0.0 standard  drinks of alcohol   Drug use: No   Sexual activity: Not Currently    Partners: Male

## 2022-05-20 ENCOUNTER — Encounter: Payer: Self-pay | Admitting: Internal Medicine

## 2022-05-20 ENCOUNTER — Ambulatory Visit: Payer: Medicaid Other | Attending: Internal Medicine | Admitting: Internal Medicine

## 2022-05-20 VITALS — BP 115/75 | HR 78 | Temp 97.9°F | Resp 16 | Ht 67.5 in | Wt 193.0 lb

## 2022-05-20 DIAGNOSIS — E1159 Type 2 diabetes mellitus with other circulatory complications: Secondary | ICD-10-CM | POA: Diagnosis not present

## 2022-05-20 DIAGNOSIS — Z1211 Encounter for screening for malignant neoplasm of colon: Secondary | ICD-10-CM

## 2022-05-20 DIAGNOSIS — I152 Hypertension secondary to endocrine disorders: Secondary | ICD-10-CM

## 2022-05-20 DIAGNOSIS — L853 Xerosis cutis: Secondary | ICD-10-CM | POA: Diagnosis not present

## 2022-05-20 DIAGNOSIS — E669 Obesity, unspecified: Secondary | ICD-10-CM

## 2022-05-20 DIAGNOSIS — E039 Hypothyroidism, unspecified: Secondary | ICD-10-CM | POA: Diagnosis not present

## 2022-05-20 DIAGNOSIS — E785 Hyperlipidemia, unspecified: Secondary | ICD-10-CM | POA: Diagnosis not present

## 2022-05-20 DIAGNOSIS — E1169 Type 2 diabetes mellitus with other specified complication: Secondary | ICD-10-CM

## 2022-05-20 LAB — POCT GLYCOSYLATED HEMOGLOBIN (HGB A1C): HbA1c, POC (prediabetic range): 6.3 % (ref 5.7–6.4)

## 2022-05-20 LAB — GLUCOSE, POCT (MANUAL RESULT ENTRY): POC Glucose: 101 mg/dl — AB (ref 70–99)

## 2022-05-20 NOTE — Progress Notes (Signed)
Patient ID: [name], female    DOB: 16-Jun-1961  MRN: [id number]  CC: Hypertension and Diabetes   Subjective: [name] is a 61 y.o. female who presents for chronic ds management Her concerns today include:  HTN, HL, depression, hypothyroid, DM, obesity, tension HA, chronic LBP (minimal degen disc ds on MRI).    Since last visit, she has seen orthopedics for right shoulder pain.  Diagnosed with possible tear of the right rotator cuff and adhesive capsulitis.  MRI of the shoulder has been ordered. For her chronic back pain, MRI of the lumbar spine showed stable mild degenerative changes when compared to study done in 2018.  Patient tells me she was told that she has fibromyalgia in her back.  Completed 6 wks of P.T. Also purchased an anti-inflammatory rub on [employer] that works well.    HTN: Reports compliance with Norvasc 10 mg daily and lisinopril 20 mg daily. She limits salt in the foods.  No chest pains or shortness of breath.  No swelling in the legs.  DM/obesity:  Results for orders placed or performed in visit on 05/20/22  HgB A1c  Result Value Ref Range   Hemoglobin A1C     HbA1c POC (<> result, manual entry)     HbA1c, POC (prediabetic range) 6.3 5.7 - 6.4 %   HbA1c, POC (controlled diabetic range)    Glucose (CBG)  Result Value Ref Range   POC Glucose 101 (A) 70 - 99 mg/dl  Taking and tolerating Trulicity 1.5 mg Qwk.  Has decreased appetite. "I need to eat a little healthier."  Feels she needs to eat less bread. Drinks water and 2% milk C/o dry skin on feet.  Using a lotion for DM skin but does not recall the name and not using it consistently.  HL: Lipid profile was not at goal despite being on atorvastatin 60 mg daily.  We increased the atorvastatin to 80 mg.  Last LDL was 121 before the increase. Due for recheck.  Hypothyroidism: Taking and tolerating the levothyroxine 50 mcg daily.  Due for TSH check.  HM: due for colon CA screening.  Did  not receive  FIT in 12/2021 as ordered.     Patient Active Problem List   Diagnosis Date Noted   Complete tear of right rotator cuff 05/16/2022   Adhesive capsulitis of right shoulder 03/14/2022   Influenza vaccine needed 10/31/2020   Need for diphtheria-tetanus-pertussis (Tdap) vaccine 10/31/2020   Hyperlipidemia associated with type 2 diabetes mellitus ([place]) 10/31/2020   Overweight (BMI 25.0-29.9) 10/31/2020   Type 2 diabetes mellitus without complication, without long-term current use of insulin ([place]) 07/31/2020   OSA (obstructive sleep apnea) 02/02/2020   Spondylosis without myelopathy or radiculopathy, lumbar region 09/17/2017   Essential hypertension 09/05/2017   Chronic bilateral low back pain with left-sided sciatica 09/05/2017   HTN (hypertension) 05/05/2017   Hypothyroidism 02/24/2017   Mixed hyperlipidemia 02/24/2017   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   Sleep related headaches 01/23/2017   Snoring 01/23/2017   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   Chronic headaches 10/30/2016   Obesity (BMI 30.0-34.9) 05/24/2016   PMB (postmenopausal bleeding) 05/24/2016   Back pain of lumbar region with sciatica 09/28/2015     Current Outpatient Medications on File Prior to Visit  Medication Sig Dispense Refill   Accu-Chek FastClix Lancets MISC Use as instructed to check blood sugar three times daily 102 each 6   acetaminophen (TYLENOL) 325 MG tablet Take 2  tablets (650 mg total) by mouth every 6 (six) hours as needed for moderate pain. 30 tablet    amitriptyline (ELAVIL) 25 MG tablet Take 1 tablet (25 mg total) by mouth at bedtime. 30 tablet 0   amLODipine (NORVASC) 10 MG tablet Take 1 tablet (10 mg total) by mouth daily. 90 tablet 1   atorvastatin (LIPITOR) 80 MG tablet Take 1 tablet (80 mg total) by mouth daily. 30 tablet 3   Blood Glucose Monitoring Suppl (ACCU-CHEK GUIDE ME) w/Device KIT 1 kit by Does not apply route daily. Use as instructed to check blood sugar daily.  E11.9 1 kit 0   Dulaglutide (TRULICITY) 1.5 PN/3.6RW SOPN INJECT 1.5MG INTO THE SKIN Once weekly 2 mL 0   DULoxetine (CYMBALTA) 30 MG capsule Take 1 capsule (30 mg total) once a day by mouth for 2 weeks, then take 1 capsule (30 mg) twice a day. 60 capsule 1   glucose blood (ACCU-CHEK GUIDE) test strip USE TO check blood sugar three times daily 100 strip 6   levothyroxine (SYNTHROID) 50 MCG tablet Take 1 tablet (50 mcg total) by mouth daily. 90 tablet 3   lisinopril (ZESTRIL) 20 MG tablet Take 1 tablet (20 mg total) by mouth daily. 90 tablet 0   omeprazole (PRILOSEC) 20 MG capsule Take 1 capsule (20 mg total) by mouth daily. 90 capsule 2   propranolol (INDERAL) 80 MG tablet Take 1 tablet (80 mg total) by mouth 2 (two) times daily. 180 tablet 0   No current facility-administered medications on file prior to visit.    Allergies  Allergen Reactions   Iron Diarrhea   Metformin And Related Nausea And Vomiting   Penicillins Nausea And Vomiting    Has patient had a PCN reaction causing immediate rash, facial/tongue/throat swelling, SOB or lightheadedness with hypotension: No Has patient had a PCN reaction causing severe rash involving mucus membranes or skin necrosis: No Has patient had a PCN reaction that required hospitalization No Has patient had a PCN reaction occurring within the last 10 years: No If all of the above answers are "NO", then may proceed with Cephalosporin use.    Social History   Socioeconomic History   Marital status: Single    Spouse name: Not on file   Number of children: 1   Years of education: 12   Highest education level: Not on file  Occupational History   Occupation: Unemployed  Tobacco Use   Smoking status: Never   Smokeless tobacco: Never  Substance and Sexual Activity   Alcohol use: No    Alcohol/week: 0.0 standard drinks of alcohol   Drug use: No   Sexual activity: Not Currently    Partners: Male  Other Topics Concern   Not on file  Social History  Narrative   Lives with son, Shanon Brow.   Caffeine use: Daily       Left handed   Social Determinants of Health   Financial Resource Strain: Not on file  Food Insecurity: Not on file  Transportation Needs: Not on file  Physical Activity: Not on file  Stress: Not on file  Social Connections: Not on file  Intimate Partner Violence: Not on file    Family History  Problem Relation Age of Onset   Headache Neg Hx    Colon cancer Neg Hx    Esophageal cancer Neg Hx    Rectal cancer Neg Hx    Stomach cancer Neg Hx    Breast cancer Neg Hx  Past Surgical History:  Procedure Laterality Date   BREAST CYST EXCISION Right 1980   BREAST CYST EXCISION Left 1985   NO PAST SURGERIES      ROS: Review of Systems Negative except as stated above  PHYSICAL EXAM: BP 115/75 (BP Location: Left Arm, Patient Position: Sitting, Cuff Size: Large)   Pulse 78   Temp 97.9 F (36.6 C) (Oral)   Resp 16   Ht 5' 7.5" (1.715 m)   Wt 193 lb (87.5 kg)   LMP 06/06/2015   SpO2 98%   BMI 29.78 kg/m   Wt Readings from Last 3 Encounters:  05/20/22 193 lb (87.5 kg)  12/27/21 195 lb 12.8 oz (88.8 kg)  12/21/21 187 lb (84.8 kg)    Physical Exam  General appearance - alert, well appearing, and in no distress Mental status - normal mood, behavior, speech, dress, motor activity, and thought processes Neck - supple, no significant adenopathy Chest - clear to auscultation, no wheezes, rales or rhonchi, symmetric air entry Heart - normal rate, regular rhythm, normal S1, S2, no murmurs, rubs, clicks or gallops Extremities - peripheral pulses normal, no pedal edema, no clubbing or cyanosis Diabetic Foot Exam - Simple   Simple Foot Form Diabetic Foot exam was performed with the following findings: Yes 05/20/2022  1:24 PM  Visual Inspection See comments: Yes Sensation Testing Intact to touch and monofilament testing bilaterally: Yes Pulse Check Posterior Tibialis and Dorsalis pulse intact bilaterally:  Yes Comments Toenails on the big toes are overgrown.  Dry flaky scaly skin on the soles of both feet, between the toes and around the heels.         Latest Ref Rng & Units 08/28/2021    9:31 AM 08/21/2020    8:30 AM 01/31/2020    3:15 PM  CMP  Glucose 70 - 99 mg/dL 97  306  186   BUN 8 - 27 mg/dL _0 Creatinine 0.57 - 1.00 mg/dL 1.08  0.79  0.86   Sodium 134 - 144 mmol/L 137  137  139   Potassium 3.5 - 5.2 mmol/L 4.7  4.5  4.7   Chloride 96 - 106 mmol/L 100  99  97   CO2 20 - 29 mmol/L _1 Calcium 8.7 - 10.3 mg/dL 10.0  10.2  10.4   Total Protein 6.0 - 8.5 g/dL 8.9  8.7  9.4   Total Bilirubin 0.0 - 1.2 mg/dL 0.4  0.5  0.5   Alkaline Phos 44 - 121 IU/L 105  112  133   AST 0 - 40 IU/L 18  36  44   ALT 0 - 32 IU/L 33  47  53    Lipid Panel     Component Value Date/Time   CHOL 194 12/27/2021 1647   TRIG 197 (H) 12/27/2021 1647   HDL 38 (L) 12/27/2021 1647   CHOLHDL 5.1 (H) 12/27/2021 1647   CHOLHDL 9.2 (H) 09/28/2015 1420   VLDL 61 (H) 09/28/2015 1420   LDLCALC 121 (H) 12/27/2021 1647    CBC    Component Value Date/Time   WBC 8.2 08/28/2021 0931   WBC 5.7 05/06/2017 0144   RBC 4.92 08/28/2021 0931   RBC 4.37 05/06/2017 0144   HGB 15.7 08/28/2021 0931   HCT 44.8 08/28/2021 0931   PLT 302 08/28/2021 0931   MCV 91 08/28/2021 0931   MCH 31.9 08/28/2021 0931   MCH 30.7 05/06/2017 0144  MCHC 35.0 08/28/2021 0931   MCHC 33.5 05/06/2017 0144   RDW 12.3 08/28/2021 0931   LYMPHSABS 1.8 05/05/2017 1108   LYMPHSABS 2.4 02/17/2017 1141   MONOABS 0.7 05/05/2017 1108   EOSABS 0.1 05/05/2017 1108   EOSABS 0.1 02/17/2017 1141   BASOSABS 0.0 05/05/2017 1108   BASOSABS 0.1 02/17/2017 1141    ASSESSMENT AND PLAN: 1. Diabetes mellitus type 2 in obese (HCC) Continue Trulicity. Discussed and encourage healthy eating habits.  Recommended she change to 1% milk or almond milk.  Encouraged her to try to get in some exercise 3 to 5 days a week for 30 minutes. - HgB  A1c - Glucose (CBG)  2. Hypertension associated with diabetes ([place]) At goal.  Continue amlodipine 10 mg daily, lisinopril 20 mg daily and propranolol 80 mg twice a day.  3. Hyperlipidemia associated with type 2 diabetes mellitus (HCC) Continue atorvastatin 80 mg daily.  Plan to recheck lipid profile today. - Lipid panel - Comprehensive metabolic panel  4. Hypothyroidism (acquired) Continue levothyroxine 50 mcg daily - TSH  5. Dry skin Recommend soaking her feet in warm water for 10 minutes at least 2-3 times a week and using an emery board to scrape off dead skin.  Apply moisturizing lotion like Palmer's c Cocoa Butter lotion or Aveeno intensive care lotion daily to the feet.  6. Screening for colon cancer - Fecal occult blood, imunochemical([facility])     Patient was given the opportunity to ask questions.  Patient verbalized understanding of the plan and was able to repeat key elements of the plan.   This documentation was completed using Radio producer.  Any transcriptional errors are unintentional.  Orders Placed This Encounter  Procedures   Fecal occult blood, imunochemical([facility])   TSH   Lipid panel   Comprehensive metabolic panel   HgB D5H   Glucose (CBG)     Requested Prescriptions    No prescriptions requested or ordered in this encounter    Return in about 4 months (around 09/19/2022).  Karle Plumber, MD, FACP

## 2022-05-20 NOTE — Patient Instructions (Signed)

## 2022-05-21 ENCOUNTER — Other Ambulatory Visit: Payer: Self-pay | Admitting: Internal Medicine

## 2022-05-21 DIAGNOSIS — Z1231 Encounter for screening mammogram for malignant neoplasm of breast: Secondary | ICD-10-CM

## 2022-05-21 DIAGNOSIS — F330 Major depressive disorder, recurrent, mild: Secondary | ICD-10-CM | POA: Diagnosis not present

## 2022-05-21 LAB — LIPID PANEL
Chol/HDL Ratio: 4.4 ratio (ref 0.0–4.4)
Cholesterol, Total: 150 mg/dL (ref 100–199)
HDL: 34 mg/dL — ABNORMAL LOW (ref >39)
LDL Chol Calc (NIH): 91 mg/dL (ref 0–99)
Triglycerides: 139 mg/dL (ref 0–149)
VLDL Cholesterol Cal: 25 mg/dL (ref 5–40)

## 2022-05-21 LAB — COMPREHENSIVE METABOLIC PANEL
ALT: 18 IU/L (ref 0–32)
AST: 14 IU/L (ref 0–40)
Albumin/Globulin Ratio: 1.2 (ref 1.2–2.2)
Albumin: 4.6 g/dL (ref 3.8–4.9)
Alkaline Phosphatase: 131 IU/L — ABNORMAL HIGH (ref 44–121)
BUN/Creatinine Ratio: 16 (ref 12–28)
BUN: 13 mg/dL (ref 8–27)
Bilirubin Total: 0.4 mg/dL (ref 0.0–1.2)
CO2: 22 mmol/L (ref 20–29)
Calcium: 9.7 mg/dL (ref 8.7–10.3)
Chloride: 101 mmol/L (ref 96–106)
Creatinine, Ser: 0.83 mg/dL (ref 0.57–1.00)
Globulin, Total: 4 g/dL (ref 1.5–4.5)
Glucose: 100 mg/dL — ABNORMAL HIGH (ref 70–99)
Potassium: 4.6 mmol/L (ref 3.5–5.2)
Sodium: 138 mmol/L (ref 134–144)
Total Protein: 8.6 g/dL — ABNORMAL HIGH (ref 6.0–8.5)
eGFR: 81 mL/min/{1.73_m2} (ref >59)

## 2022-05-21 LAB — TSH: TSH: 2.06 u[IU]/mL (ref 0.450–4.500)

## 2022-05-28 ENCOUNTER — Encounter: Payer: Self-pay | Admitting: Orthopaedic Surgery

## 2022-05-29 ENCOUNTER — Telehealth: Payer: Self-pay | Admitting: Physical Medicine and Rehabilitation

## 2022-05-29 NOTE — Telephone Encounter (Signed)
Patient would like to [facility] her appointment witjh Megan. Her call back number is [phone]

## 2022-05-30 ENCOUNTER — Ambulatory Visit
Admission: RE | Admit: 2022-05-30 | Discharge: 2022-05-30 | Disposition: A | Discharge status: Home / Self Care | Payer: Medicaid Other | Source: Ambulatory Visit | Attending: Physician Assistant | Admitting: Physician Assistant

## 2022-05-30 DIAGNOSIS — M19011 Primary osteoarthritis, right shoulder: Secondary | ICD-10-CM | POA: Diagnosis not present

## 2022-05-30 DIAGNOSIS — S4991XA Unspecified injury of right shoulder and upper arm, initial encounter: Secondary | ICD-10-CM | POA: Diagnosis not present

## 2022-05-30 DIAGNOSIS — M7501 Adhesive capsulitis of right shoulder: Secondary | ICD-10-CM

## 2022-05-31 ENCOUNTER — Ambulatory Visit: Payer: Medicaid Other | Admitting: Physical Medicine and Rehabilitation

## 2022-06-01 ENCOUNTER — Encounter: Payer: Self-pay | Admitting: Orthopaedic Surgery

## 2022-06-04 ENCOUNTER — Ambulatory Visit (INDEPENDENT_AMBULATORY_CARE_PROVIDER_SITE_OTHER): Payer: Medicaid Other | Admitting: Physical Medicine and Rehabilitation

## 2022-06-04 ENCOUNTER — Encounter: Payer: Self-pay | Admitting: Physical Medicine and Rehabilitation

## 2022-06-04 ENCOUNTER — Other Ambulatory Visit: Payer: Self-pay | Admitting: Internal Medicine

## 2022-06-04 VITALS — BP 103/72 | HR 67

## 2022-06-04 DIAGNOSIS — M546 Pain in thoracic spine: Secondary | ICD-10-CM

## 2022-06-04 DIAGNOSIS — M797 Fibromyalgia: Secondary | ICD-10-CM

## 2022-06-04 DIAGNOSIS — M545 Low back pain, unspecified: Secondary | ICD-10-CM

## 2022-06-04 DIAGNOSIS — G8929 Other chronic pain: Secondary | ICD-10-CM

## 2022-06-04 DIAGNOSIS — M7918 Myalgia, other site: Secondary | ICD-10-CM | POA: Diagnosis not present

## 2022-06-04 DIAGNOSIS — E1169 Type 2 diabetes mellitus with other specified complication: Secondary | ICD-10-CM

## 2022-06-04 MED ORDER — DULOXETINE HCL 30 MG PO CPEP: ORAL_CAPSULE | ORAL | 1 refills | Status: DC

## 2022-06-04 NOTE — Progress Notes (Unsigned)
[facility] - 61 y.o. female MRN [id number]  Date of birth: [birth year]  Office Visit Note: Visit Date: 06/04/2022 PCP: Ladell Pier, MD Referred by: Ladell Pier, MD  Subjective: Chief Complaint  Patient presents with   Lower Back - Pain   Left Leg - Pain   HPI: [name] is a 61 y.o. female who comes in today for evaluation of chronic bilateral lower back pain radiating up to thoracic back and intermittently down entire left leg to foot. Patient reports pain has been ongoing for several years and worsens with movement, activity and standing. Patient reports some relief of pain with formal physical therapy, home exercise regimen, rest and use of medications. Patient's recent lumbar MRI exhibits stable mild degenerative changes when compared to 2018. Small left inferior foraminal protrusion at the levels of L2-L3 and L3-L4. No high grade spinal canal stenosis noted. Her lumbar MRI looks good for her age. She has had multiple lumbar injections in our office over the years including transforaminal, facet joint blocks and radiofrequency ablation. Patient reports no relief of pain with these procedures. Patient states she was recently evaluated by her primary care provider, patient states PCP told her that her issues are likely being caused by arthritis and not fibromyalgia. Patient continues to take Cymbalta 30 mg twice a day and states she believes this medication is helping her significantly. Overall, patient states she is doing well and working on being more physically active. Patient denies focal weakness, numbness and tingling. Patient denies recent trauma or falls.    Review of Systems  Musculoskeletal:  Positive for back pain and myalgias.  Neurological:  Negative for tingling, sensory change, focal weakness and weakness.  All other systems reviewed and are negative.  Otherwise per HPI.  Assessment & Plan: Visit Diagnoses:    ICD-10-CM   1. Chronic bilateral low  back pain without sciatica  M54.50    G89.29     2. Chronic bilateral thoracic back pain  M54.6    G89.29     3. Myofascial pain syndrome  M79.18     4. Fibromyalgia  M79.7        Plan: Findings:  Chronic bilateral lower back pain radiating up to thoracic back and intermittently down entire left leg to foot. Patient continues to manage pain with conservative therapies such as home exercise regimen, rest and use of medications. Patients clinical presentation and exam are consistent with myofascial pain/fibromyalgia. She has minimal degenerative changes on recent lumbar MRI imaging and did not get good relief with lumbar radiofrequency ablation. We do not think her pain is related to arthritis. Good relief of pain at this time with continued use of Cymbalta. We will continue to monitor patient and see her back as needed. I did discuss management of fibromyalgia and emphasized importance of stress reduction, consistent exercise routine and good sleep hygiene. I also spoke with patient about possibility of re-grouping with physical therapy as needed. I did place refill of Cymbalta today. Patient inquiring about rheumatological issues. I did explain to her that we do not specialize in rheumatology, states she will speak with her primary care regarding this issue. No red flag symptoms noted.     Meds & Orders:  Meds ordered this encounter  Medications   DULoxetine (CYMBALTA) 30 MG capsule    Sig: Take 1 tablet (30 mg) twice a day.    Dispense:  60 capsule    Refill:  1   No orders  of the defined types were placed in this encounter.   Follow-up: Return if symptoms worsen or fail to improve.   Procedures: No procedures performed      Clinical History: EXAM: MRI LUMBAR SPINE WITHOUT CONTRAST   TECHNIQUE: Multiplanar, multisequence MR imaging of the lumbar spine was performed. No intravenous contrast was administered.   COMPARISON:  05/16/2017   FINDINGS: Segmentation:  Standard.    Alignment:  Physiologic.   Vertebrae:  No fracture, evidence of discitis, or bone lesion.   Conus medullaris and cauda equina: Conus extends to the L1 level. Conus and cauda equina appear normal.   Paraspinal and other soft tissues: Negative for perispinal mass or inflammation   Disc levels:   T12- L1: Unremarkable.   L1-L2: Unremarkable.   L2-L3: Small left inferior foraminal protrusion. No change or neural impingement   L3-L4: Small left inferior foraminal protrusion. Minor disc bulging. No change or neural impingement.   L4-L5: Minor annulus bulging   L5-S1:Minor annulus bulging   IMPRESSION: Stable mild degenerative changes when compared to 2018. No neural impingement     Electronically Signed   By: Jorje Guild M.D.   On: 03/31/2022 04:09   She reports that she has never smoked. She has never used smokeless tobacco.  Recent Labs    08/28/21 0839 12/27/21 1550 05/20/22 1113  HGBA1C 5.8 6.4* 6.3    Objective:  VS:  HT:    WT:   BMI:     BP:103/72  HR:67bpm  TEMP: ( )  RESP:  Physical Exam Vitals and nursing note reviewed.  HENT:     Head: Normocephalic and atraumatic.     Right Ear: External ear normal.     Left Ear: External ear normal.     Nose: Nose normal.     Mouth/Throat:     Mouth: Mucous membranes are moist.  Eyes:     Pupils: Pupils are equal, round, and reactive to light.  Cardiovascular:     Rate and Rhythm: Normal rate.     Pulses: Normal pulses.  Pulmonary:     Effort: Pulmonary effort is normal.  Abdominal:     General: Abdomen is flat. There is no distension.  Musculoskeletal:        General: Tenderness present.     Cervical back: Normal range of motion.     Comments: No discomfort noted with flexion, extension and side-to-side rotation. Patient has good strength in the upper extremities including 5 out of 5 strength in wrist extension, long finger flexion and APB.  There is no atrophy of the hands intrinsically.  Tenderness noted upon palpation of bilateral thoracic paraspinal regions. Sensation intact bilaterally   Skin:    General: Skin is warm and dry.     Capillary Refill: Capillary refill takes less than 2 seconds.  Neurological:     General: No focal deficit present.     Mental Status: She is alert and oriented to person, place, and time.  Psychiatric:        Mood and Affect: Mood normal.        Behavior: Behavior normal.     Ortho Exam  Imaging: No results found.  Past Medical/Family/Surgical/Social History: Medications & Allergies reviewed per EMR, new medications updated. Patient Active Problem List   Diagnosis Date Noted   Complete tear of right rotator cuff 05/16/2022   Adhesive capsulitis of right shoulder 03/14/2022   Influenza vaccine needed 10/31/2020   Need for diphtheria-tetanus-pertussis (Tdap) vaccine 10/31/2020  Hyperlipidemia associated with type 2 diabetes mellitus ([place]) 10/31/2020   Overweight (BMI 25.0-29.9) 10/31/2020   Type 2 diabetes mellitus without complication, without long-term current use of insulin ([place]) 07/31/2020   OSA (obstructive sleep apnea) 02/02/2020   Spondylosis without myelopathy or radiculopathy, lumbar region 09/17/2017   Essential hypertension 09/05/2017   Chronic bilateral low back pain with left-sided sciatica 09/05/2017   HTN (hypertension) 05/05/2017   Hypothyroidism 02/24/2017   Mixed hyperlipidemia 02/24/2017   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   Sleep related headaches 01/23/2017   Snoring 01/23/2017   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   Chronic headaches 10/30/2016   Obesity (BMI 30.0-34.9) 05/24/2016   PMB (postmenopausal bleeding) 05/24/2016   Back pain of lumbar region with sciatica 09/28/2015   Past Medical History:  Diagnosis Date   Anxiety    Arthritis    Depression    GERD (gastroesophageal reflux disease)    Hyperlipidemia    Hypertension    Migraines    Thyroid disease    Family History  Problem  Relation Age of Onset   Headache Neg Hx    Colon cancer Neg Hx    Esophageal cancer Neg Hx    Rectal cancer Neg Hx    Stomach cancer Neg Hx    Breast cancer Neg Hx    Past Surgical History:  Procedure Laterality Date   BREAST CYST EXCISION Right 1980   BREAST CYST EXCISION Left 1985   NO PAST SURGERIES     Social History   Occupational History   Occupation: Unemployed  Tobacco Use   Smoking status: Never   Smokeless tobacco: Never  Substance and Sexual Activity   Alcohol use: No    Alcohol/week: 0.0 standard drinks of alcohol   Drug use: No   Sexual activity: Not Currently    Partners: Male

## 2022-06-04 NOTE — Progress Notes (Unsigned)
Pt state lower back pain that travels to her left leg. Pt state bending, standing and sitting makes the pain worse. Pt state she takes over the counter pain meds to help ease her pain.  Pt here today for MRI review

## 2022-06-06 ENCOUNTER — Encounter: Payer: Self-pay | Admitting: Orthopaedic Surgery

## 2022-06-17 ENCOUNTER — Ambulatory Visit
Admission: RE | Admit: 2022-06-17 | Discharge: 2022-06-17 | Disposition: A | Discharge status: Home / Self Care | Payer: Medicaid Other | Source: Ambulatory Visit | Attending: Internal Medicine | Admitting: Internal Medicine

## 2022-06-17 DIAGNOSIS — Z1231 Encounter for screening mammogram for malignant neoplasm of breast: Secondary | ICD-10-CM

## 2022-06-19 ENCOUNTER — Ambulatory Visit (INDEPENDENT_AMBULATORY_CARE_PROVIDER_SITE_OTHER): Payer: Medicaid Other | Admitting: Orthopaedic Surgery

## 2022-06-19 ENCOUNTER — Encounter: Payer: Self-pay | Admitting: Orthopaedic Surgery

## 2022-06-19 ENCOUNTER — Other Ambulatory Visit: Payer: Self-pay | Admitting: Internal Medicine

## 2022-06-19 DIAGNOSIS — M25511 Pain in right shoulder: Secondary | ICD-10-CM | POA: Diagnosis not present

## 2022-06-19 DIAGNOSIS — M7501 Adhesive capsulitis of right shoulder: Secondary | ICD-10-CM

## 2022-06-19 DIAGNOSIS — E119 Type 2 diabetes mellitus without complications: Secondary | ICD-10-CM

## 2022-06-19 MED ORDER — METHYLPREDNISOLONE ACETATE 40 MG/ML IJ SUSP
80.0000 mg | INTRAMUSCULAR | Status: AC | PRN
  Administered 2022-06-19: 80 mg via INTRA_ARTICULAR

## 2022-06-19 MED ORDER — BUPIVACAINE HCL 0.25 % IJ SOLN
2.0000 mL | INTRAMUSCULAR | Status: AC | PRN
  Administered 2022-06-19: 2 mL via INTRA_ARTICULAR

## 2022-06-19 MED ORDER — LIDOCAINE HCL 2 % IJ SOLN
2.0000 mL | INTRAMUSCULAR | Status: AC | PRN
  Administered 2022-06-19: 2 mL

## 2022-06-19 NOTE — Progress Notes (Signed)
Office Visit Note   Patient: [name]           Date of Birth: Sep 16, 1961           MRN: [id number] Visit Date: 06/19/2022              Requested by: Ladell Pier, MD [address] [place],  [place] [address] PCP: Ladell Pier, MD   Assessment & Plan: Visit Diagnoses:  1. Adhesive capsulitis of right shoulder     Plan: Mrs [name] has been followed recently for the adhesive capsulitis of her right shoulder.  She has had a course of physical therapy and did quite well.  A little over a month ago she had recurrent pain after lifting a grocery bag.  We ordered an MRI scan.  She notes that her shoulder still little uncomfortable and somewhat "stiff".  The MRI scan revealed some tendinosis in the supraspinatus but without a specific tear.  Subscapularis teres minor and infraspinatus were also intact.  No rotator cuff muscle atrophy, fatty infiltration or edema.  Long head of the biceps was intact.  There were mild degenerative changes at the [facility] joint with subchondral marrow edema and peripheral osteophytes.  There is also mild to moderate thinning of the glenoid and humeral head cartilage.  There was minimal humeral head neck junction degenerative osteophytosis.  No acute fractures.  At the very least she has recurrent adhesive capsulitis.  I will inject the subacromial space with cortisone today and she actually had better motion and then we will start a course of physical therapy.  Like to see her back in approximately a month for reevaluation  Follow-Up Instructions: Return in about 1 month (around 07/20/2022).   Orders:  Orders Placed This Encounter  Procedures   Ambulatory referral to Physical Therapy   No orders of the defined types were placed in this encounter.     Procedures: Large Joint Inj: R subacromial bursa on 06/19/2022 2:47 PM Indications: pain and diagnostic evaluation Details: 25 G 1.5 in needle, anterolateral approach  Arthrogram:  No  Medications: 2 mL lidocaine 2 %; 80 mg methylPREDNISolone acetate 40 MG/ML; 2 mL bupivacaine 0.25 % Consent was given by the patient. Immediately prior to procedure a time out was called to verify the correct patient, procedure, equipment, support staff and site/side marked as required. Patient was prepped and draped in the usual sterile fashion.       Clinical Data: No additional findings.   Subjective: Chief Complaint  Patient presents with   Right Shoulder - Follow-up    MRI review  Patient presents today for follow up on her right shoulder. She had an MRI and is here today for those results.  HPI  Review of Systems   Objective: Vital Signs: LMP 06/06/2015   Physical Exam Constitutional:      Appearance: She is well-developed.  Eyes:     Pupils: Pupils are equal, round, and reactive to light.  Pulmonary:     Effort: Pulmonary effort is normal.  Skin:    General: Skin is warm and dry.  Neurological:     Mental Status: She is alert and oriented to person, place, and time.  Psychiatric:        Behavior: Behavior normal.     Ortho Exam awake alert and oriented x3.  Comfortable sitting.  No acute distress.  There was limitation of motion of the right shoulder.  I can abduct about 80 degrees  and only flex about 110.  Negative impingement empty can testing and really negative speeds sign.  Good grip and good release.  After injection she had better motion both actively and passively with abduction and flexion.  She does have a history of fibromyalgia  Specialty Comments:  EXAM: MRI LUMBAR SPINE WITHOUT CONTRAST   TECHNIQUE: Multiplanar, multisequence MR imaging of the lumbar spine was performed. No intravenous contrast was administered.   COMPARISON:  05/16/2017   FINDINGS: Segmentation:  Standard.   Alignment:  Physiologic.   Vertebrae:  No fracture, evidence of discitis, or bone lesion.   Conus medullaris and cauda equina: Conus extends to the L1  level. Conus and cauda equina appear normal.   Paraspinal and other soft tissues: Negative for perispinal mass or inflammation   Disc levels:   T12- L1: Unremarkable.   L1-L2: Unremarkable.   L2-L3: Small left inferior foraminal protrusion. No change or neural impingement   L3-L4: Small left inferior foraminal protrusion. Minor disc bulging. No change or neural impingement.   L4-L5: Minor annulus bulging   L5-S1:Minor annulus bulging   IMPRESSION: Stable mild degenerative changes when compared to 2018. No neural impingement     Electronically Signed   By: Jorje Guild M.D.   On: 03/31/2022 04:09  Imaging: No results found.   PMFS History: Patient Active Problem List   Diagnosis Date Noted   Complete tear of right rotator cuff 05/16/2022   Adhesive capsulitis of right shoulder 03/14/2022   Influenza vaccine needed 10/31/2020   Need for diphtheria-tetanus-pertussis (Tdap) vaccine 10/31/2020   Hyperlipidemia associated with type 2 diabetes mellitus ([place]) 10/31/2020   Overweight (BMI 25.0-29.9) 10/31/2020   Type 2 diabetes mellitus without complication, without long-term current use of insulin ([name]) 07/31/2020   OSA (obstructive sleep apnea) 02/02/2020   Spondylosis without myelopathy or radiculopathy, lumbar region 09/17/2017   Essential hypertension 09/05/2017   Chronic bilateral low back pain with left-sided sciatica 09/05/2017   HTN (hypertension) 05/05/2017   Hypothyroidism 02/24/2017   Mixed hyperlipidemia 02/24/2017   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   Sleep related headaches 01/23/2017   Snoring 01/23/2017   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   Chronic headaches 10/30/2016   Obesity (BMI 30.0-34.9) 05/24/2016   PMB (postmenopausal bleeding) 05/24/2016   Back pain of lumbar region with sciatica 09/28/2015   Past Medical History:  Diagnosis Date   Anxiety    Arthritis    Depression    GERD (gastroesophageal reflux disease)     Hyperlipidemia    Hypertension    Migraines    Thyroid disease     Family History  Problem Relation Age of Onset   Headache Neg Hx    Colon cancer Neg Hx    Esophageal cancer Neg Hx    Rectal cancer Neg Hx    Stomach cancer Neg Hx    Breast cancer Neg Hx     Past Surgical History:  Procedure Laterality Date   BREAST CYST EXCISION Right 1980   BREAST CYST EXCISION Left 1985   NO PAST SURGERIES     Social History   Occupational History   Occupation: Unemployed  Tobacco Use   Smoking status: Never   Smokeless tobacco: Never  Substance and Sexual Activity   Alcohol use: No    Alcohol/week: 0.0 standard drinks of alcohol   Drug use: No   Sexual activity: Not Currently    Partners: Male

## 2022-06-20 ENCOUNTER — Encounter: Payer: Self-pay | Admitting: Orthopaedic Surgery

## 2022-06-21 ENCOUNTER — Encounter: Payer: Self-pay | Admitting: Orthopaedic Surgery

## 2022-06-24 ENCOUNTER — Encounter: Payer: Self-pay | Admitting: Orthopaedic Surgery

## 2022-06-26 ENCOUNTER — Other Ambulatory Visit: Payer: Self-pay | Admitting: Internal Medicine

## 2022-06-26 DIAGNOSIS — E039 Hypothyroidism, unspecified: Secondary | ICD-10-CM

## 2022-06-30 ENCOUNTER — Encounter: Payer: Self-pay | Admitting: Internal Medicine

## 2022-07-04 ENCOUNTER — Other Ambulatory Visit: Payer: Self-pay | Admitting: Internal Medicine

## 2022-07-04 ENCOUNTER — Other Ambulatory Visit: Payer: Self-pay | Admitting: Physical Medicine and Rehabilitation

## 2022-07-04 DIAGNOSIS — I10 Essential (primary) hypertension: Secondary | ICD-10-CM

## 2022-07-04 MED ORDER — DULOXETINE HCL 30 MG PO CPEP: ORAL_CAPSULE | ORAL | 1 refills | Status: DC

## 2022-07-05 ENCOUNTER — Encounter: Payer: Self-pay | Admitting: Orthopaedic Surgery

## 2022-07-10 DIAGNOSIS — F330 Major depressive disorder, recurrent, mild: Secondary | ICD-10-CM | POA: Diagnosis not present

## 2022-07-16 ENCOUNTER — Ambulatory Visit: Payer: Medicaid Other | Admitting: Orthopaedic Surgery

## 2022-07-22 ENCOUNTER — Encounter: Payer: Self-pay | Admitting: Internal Medicine

## 2022-07-23 ENCOUNTER — Ambulatory Visit (INDEPENDENT_AMBULATORY_CARE_PROVIDER_SITE_OTHER): Payer: Medicaid Other | Admitting: Orthopaedic Surgery

## 2022-07-23 ENCOUNTER — Other Ambulatory Visit: Payer: Self-pay | Admitting: Internal Medicine

## 2022-07-23 ENCOUNTER — Encounter: Payer: Self-pay | Admitting: Orthopaedic Surgery

## 2022-07-23 DIAGNOSIS — M7501 Adhesive capsulitis of right shoulder: Secondary | ICD-10-CM | POA: Diagnosis not present

## 2022-07-23 MED ORDER — TRULICITY 1.5 MG/0.5ML ~~LOC~~ SOAJ: SUBCUTANEOUS | 6 refills | Status: DC

## 2022-07-23 NOTE — Progress Notes (Signed)
Refer to Dr. Marlou Sa refer  Office Visit Note   Patient: [name]           Date of Birth: 05/06/1961           MRN: [id number] Visit Date: 07/23/2022              Requested by: Ladell Pier, MD [place],  [place] [address] PCP: Ladell Pier, MD   Assessment & Plan: Visit Diagnoses:  1. Adhesive capsulitis of right shoulder     Plan: [name] has been followed issue with her right shoulder.  Clinically she has adhesive capsulitis.  She has had an MRI scan performed in August and demonstrated tendinosis of the supraspinatus tendon without a specific tear.  The remainder of the rotator cuff appeared to be intact.  There was some mild degenerative changes at the [facility] joint with some mild marrow edema and mild to moderate chondral malacia of the glenohumeral joint.  I injected the subacromial space which only provided minimal relief of her pain.  I also set up a course of physical therapy but she notes that the co-pays are too expensive.  She continues to have pain with minimally positive impingement testing and lack of full overhead motion.  She is diabetic.  Would like Dr. Marlou Sa to evaluate.  She might be a candidate for manipulation or even arthroscopic debridement.  Follow-Up Instructions: No follow-ups on file.   Orders:  No orders of the defined types were placed in this encounter.  No orders of the defined types were placed in this encounter.     Procedures: No procedures performed   Clinical Data: No additional findings.   Subjective: Chief Complaint  Patient presents with   Right Shoulder - Pain  Several month history of right shoulder pain that has not been responsive to subacromial cortisone injection.  Physical therapy was ordered but the expense was too great and she decided not to proceed.  She is continues to have trouble with overhead motion.  No specific injury or trauma.  Patient is diabetic  HPI  Review of  Systems   Objective: Vital Signs: LMP 06/06/2015   Physical Exam Constitutional:      Appearance: She is well-developed.  Pulmonary:     Effort: Pulmonary effort is normal.  Skin:    General: Skin is warm and dry.  Neurological:     Mental Status: She is alert and oriented to person, place, and time.  Psychiatric:        Behavior: Behavior normal.     Ortho Exam awake alert and oriented x3.  Comfortable sitting and in no acute distress.  Lacks maybe 30 to 40 degrees of full overhead motion.  It is quite tight.  Abduction at 90 degrees.  Some loss of internal rotation.  Minimally positive impingement testing and minimally positive empty can testing.  Appears to have good strength.  Mild tenderness over the [facility] joint but more along the anterior subacromial region no grinding or crepitation no related neck pain.  Specialty Comments:  EXAM: MRI LUMBAR SPINE WITHOUT CONTRAST   TECHNIQUE: Multiplanar, multisequence MR imaging of the lumbar spine was performed. No intravenous contrast was administered.   COMPARISON:  05/16/2017   FINDINGS: Segmentation:  Standard.   Alignment:  Physiologic.   Vertebrae:  No fracture, evidence of discitis, or bone lesion.   Conus medullaris and cauda equina: Conus extends to the L1 level. Conus and cauda equina  appear normal.   Paraspinal and other soft tissues: Negative for perispinal mass or inflammation   Disc levels:   T12- L1: Unremarkable.   L1-L2: Unremarkable.   L2-L3: Small left inferior foraminal protrusion. No change or neural impingement   L3-L4: Small left inferior foraminal protrusion. Minor disc bulging. No change or neural impingement.   L4-L5: Minor annulus bulging   L5-S1:Minor annulus bulging   IMPRESSION: Stable mild degenerative changes when compared to 2018. No neural impingement     Electronically Signed   By: Jorje Guild M.D.   On: 03/31/2022 04:09  Imaging: No results found.   PMFS  History: Patient Active Problem List   Diagnosis Date Noted   Complete tear of right rotator cuff 05/16/2022   Adhesive capsulitis of right shoulder 03/14/2022   Influenza vaccine needed 10/31/2020   Need for diphtheria-tetanus-pertussis (Tdap) vaccine 10/31/2020   Hyperlipidemia associated with type 2 diabetes mellitus ([place]) 10/31/2020   Overweight (BMI 25.0-29.9) 10/31/2020   Type 2 diabetes mellitus without complication, without long-term current use of insulin ([place]) 07/31/2020   OSA (obstructive sleep apnea) 02/02/2020   Spondylosis without myelopathy or radiculopathy, lumbar region 09/17/2017   Essential hypertension 09/05/2017   Chronic bilateral low back pain with left-sided sciatica 09/05/2017   HTN (hypertension) 05/05/2017   Hypothyroidism 02/24/2017   Mixed hyperlipidemia 02/24/2017   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   Sleep related headaches 01/23/2017   Snoring 01/23/2017   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   Chronic headaches 10/30/2016   Obesity (BMI 30.0-34.9) 05/24/2016   PMB (postmenopausal bleeding) 05/24/2016   Back pain of lumbar region with sciatica 09/28/2015   Past Medical History:  Diagnosis Date   Anxiety    Arthritis    Depression    GERD (gastroesophageal reflux disease)    Hyperlipidemia    Hypertension    Migraines    Thyroid disease     Family History  Problem Relation Age of Onset   Headache Neg Hx    Colon cancer Neg Hx    Esophageal cancer Neg Hx    Rectal cancer Neg Hx    Stomach cancer Neg Hx    Breast cancer Neg Hx     Past Surgical History:  Procedure Laterality Date   BREAST CYST EXCISION Right 1980   BREAST CYST EXCISION Left 1985   NO PAST SURGERIES     Social History   Occupational History   Occupation: Unemployed  Tobacco Use   Smoking status: Never   Smokeless tobacco: Never  Substance and Sexual Activity   Alcohol use: No    Alcohol/week: 0.0 standard drinks of alcohol   Drug use: No   Sexual  activity: Not Currently    Partners: Male     Garald Balding, MD   Note - This record has been created using Editor, commissioning.  Chart creation errors have been sought, but may not always  have been located. Such creation errors do not reflect on  the standard of medical care.

## 2022-07-24 ENCOUNTER — Ambulatory Visit: Payer: Medicaid Other | Admitting: Physical Therapy

## 2022-07-30 DIAGNOSIS — G3184 Mild cognitive impairment, so stated: Secondary | ICD-10-CM | POA: Diagnosis not present

## 2022-07-30 DIAGNOSIS — G43019 Migraine without aura, intractable, without status migrainosus: Secondary | ICD-10-CM | POA: Diagnosis not present

## 2022-07-30 DIAGNOSIS — E039 Hypothyroidism, unspecified: Secondary | ICD-10-CM | POA: Diagnosis not present

## 2022-07-30 DIAGNOSIS — F411 Generalized anxiety disorder: Secondary | ICD-10-CM | POA: Diagnosis not present

## 2022-07-30 DIAGNOSIS — F330 Major depressive disorder, recurrent, mild: Secondary | ICD-10-CM | POA: Diagnosis not present

## 2022-07-30 DIAGNOSIS — E782 Mixed hyperlipidemia: Secondary | ICD-10-CM | POA: Diagnosis not present

## 2022-07-30 DIAGNOSIS — F519 Sleep disorder not due to a substance or known physiological condition, unspecified: Secondary | ICD-10-CM | POA: Diagnosis not present

## 2022-07-30 DIAGNOSIS — E118 Type 2 diabetes mellitus with unspecified complications: Secondary | ICD-10-CM | POA: Diagnosis not present

## 2022-07-30 DIAGNOSIS — I10 Essential (primary) hypertension: Secondary | ICD-10-CM | POA: Diagnosis not present

## 2022-08-05 ENCOUNTER — Other Ambulatory Visit: Payer: Self-pay | Admitting: Internal Medicine

## 2022-08-05 DIAGNOSIS — E119 Type 2 diabetes mellitus without complications: Secondary | ICD-10-CM

## 2022-08-06 DIAGNOSIS — F330 Major depressive disorder, recurrent, mild: Secondary | ICD-10-CM | POA: Diagnosis not present

## 2022-08-07 ENCOUNTER — Ambulatory Visit: Payer: Self-pay

## 2022-08-07 ENCOUNTER — Ambulatory Visit (INDEPENDENT_AMBULATORY_CARE_PROVIDER_SITE_OTHER): Payer: Medicaid Other | Admitting: Orthopedic Surgery

## 2022-08-07 DIAGNOSIS — M7501 Adhesive capsulitis of right shoulder: Secondary | ICD-10-CM

## 2022-08-08 ENCOUNTER — Encounter: Payer: Self-pay | Admitting: Orthopedic Surgery

## 2022-08-08 NOTE — Progress Notes (Signed)
Office Visit Note   Patient: [name]           Date of Birth: 10-07-1961           MRN: [id number] Visit Date: 08/07/2022 Requested by: Ladell Pier, MD [place],  [place] [address] PCP: Ladell Pier, MD  Subjective: Chief Complaint  Patient presents with   Right Shoulder - Pain    HPI: [name] is a 61 y.o. female who presents to the office reporting right shoulder pain.  She reports stiffness and decreased range of motion which has been going on for several months.  When she lifts her arm she reports pain.  Takes Tylenol for symptoms.  Pain occurs at the mid humeral level.  Symptoms are worsening.  Subacromial injection helped for about 1 day.  She did do physical therapy for her back..                ROS: All systems reviewed are negative as they relate to the chief complaint within the history of present illness.  Patient denies fevers or chills.  Assessment & Plan: Visit Diagnoses:  1. Adhesive capsulitis of right shoulder     Plan: Impression is right shoulder adhesive capsulitis.  She does have diminished passive range of motion on the right compared to the left.  Plan is ultrasound-guided intra-articular injection today with 8-week return and consideration for or against repeat injection versus manipulation and rotator interval release.  Follow-Up Instructions: No follow-ups on file.   Orders:  Orders Placed This Encounter  Procedures   US Guided Needle Placement - No Linked Charges   No orders of the defined types were placed in this encounter.     Procedures: Large Joint Inj: R glenohumeral on 08/07/2022 10:36 AM Indications: diagnostic evaluation and pain Details: 18 G 1.5 in needle, ultrasound-guided posterior approach  Arthrogram: No  Medications: 9 mL bupivacaine 0.5 %; 40 mg methylPREDNISolone acetate 40 MG/ML; 5 mL lidocaine 1 % Outcome: tolerated well, no immediate complications Procedure, treatment  alternatives, risks and benefits explained, specific risks discussed. Consent was given by the patient. Immediately prior to procedure a time out was called to verify the correct patient, procedure, equipment, support staff and site/side marked as required. Patient was prepped and draped in the usual sterile fashion.       Clinical Data: No additional findings.  Objective: Vital Signs: LMP 06/06/2015   Physical Exam:  Constitutional: Patient appears well-developed HEENT:  Head: Normocephalic Eyes:EOM are normal Neck: Normal range of motion Cardiovascular: Normal rate Pulmonary/chest: Effort normal Neurologic: Patient is alert Skin: Skin is warm Psychiatric: Patient has normal mood and affect  Ortho Exam: Ortho exam demonstrates passive range of motion on the left of 30/95/170.  Range of motion on the right is 15/85/120.  7 less vertebral bodies of internal rotation on the right compared to the left.  Rotator cuff strength is reasonable to infraspinatus supraspinatus subscap muscle testing on that right-hand side.  No masses lymphadenopathy or skin changes noted in that shoulder girdle region.  Specialty Comments:  EXAM: MRI LUMBAR SPINE WITHOUT CONTRAST   TECHNIQUE: Multiplanar, multisequence MR imaging of the lumbar spine was performed. No intravenous contrast was administered.   COMPARISON:  05/16/2017   FINDINGS: Segmentation:  Standard.   Alignment:  Physiologic.   Vertebrae:  No fracture, evidence of discitis, or bone lesion.   Conus medullaris and cauda equina: Conus extends to the L1 level. Conus  and cauda equina appear normal.   Paraspinal and other soft tissues: Negative for perispinal mass or inflammation   Disc levels:   T12- L1: Unremarkable.   L1-L2: Unremarkable.   L2-L3: Small left inferior foraminal protrusion. No change or neural impingement   L3-L4: Small left inferior foraminal protrusion. Minor disc bulging. No change or neural  impingement.   L4-L5: Minor annulus bulging   L5-S1:Minor annulus bulging   IMPRESSION: Stable mild degenerative changes when compared to 2018. No neural impingement     Electronically Signed   By: Jorje Guild M.D.   On: 03/31/2022 04:09  Imaging: No results found.   PMFS History: Patient Active Problem List   Diagnosis Date Noted   Complete tear of right rotator cuff 05/16/2022   Adhesive capsulitis of right shoulder 03/14/2022   Influenza vaccine needed 10/31/2020   Need for diphtheria-tetanus-pertussis (Tdap) vaccine 10/31/2020   Hyperlipidemia associated with type 2 diabetes mellitus ([place]) 10/31/2020   Overweight (BMI 25.0-29.9) 10/31/2020   Type 2 diabetes mellitus without complication, without long-term current use of insulin ([place]) 07/31/2020   OSA (obstructive sleep apnea) 02/02/2020   Spondylosis without myelopathy or radiculopathy, lumbar region 09/17/2017   Essential hypertension 09/05/2017   Chronic bilateral low back pain with left-sided sciatica 09/05/2017   HTN (hypertension) 05/05/2017   Hypothyroidism 02/24/2017   Mixed hyperlipidemia 02/24/2017   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   Sleep related headaches 01/23/2017   Snoring 01/23/2017   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   Chronic headaches 10/30/2016   Obesity (BMI 30.0-34.9) 05/24/2016   PMB (postmenopausal bleeding) 05/24/2016   Back pain of lumbar region with sciatica 09/28/2015   Past Medical History:  Diagnosis Date   Anxiety    Arthritis    Depression    GERD (gastroesophageal reflux disease)    Hyperlipidemia    Hypertension    Migraines    Thyroid disease     Family History  Problem Relation Age of Onset   Headache Neg Hx    Colon cancer Neg Hx    Esophageal cancer Neg Hx    Rectal cancer Neg Hx    Stomach cancer Neg Hx    Breast cancer Neg Hx     Past Surgical History:  Procedure Laterality Date   BREAST CYST EXCISION Right 1980   BREAST CYST EXCISION Left  1985   NO PAST SURGERIES     Social History   Occupational History   Occupation: Unemployed  Tobacco Use   Smoking status: Never   Smokeless tobacco: Never  Substance and Sexual Activity   Alcohol use: No    Alcohol/week: 0.0 standard drinks of alcohol   Drug use: No   Sexual activity: Not Currently    Partners: Male

## 2022-08-11 MED ORDER — METHYLPREDNISOLONE ACETATE 40 MG/ML IJ SUSP
40.0000 mg | INTRAMUSCULAR | Status: AC | PRN
  Administered 2022-08-07: 40 mg via INTRA_ARTICULAR

## 2022-08-11 MED ORDER — LIDOCAINE HCL 1 % IJ SOLN
5.0000 mL | INTRAMUSCULAR | Status: AC | PRN
  Administered 2022-08-07: 5 mL

## 2022-08-11 MED ORDER — BUPIVACAINE HCL 0.5 % IJ SOLN
9.0000 mL | INTRAMUSCULAR | Status: AC | PRN
  Administered 2022-08-07: 9 mL via INTRA_ARTICULAR

## 2022-08-27 DIAGNOSIS — F411 Generalized anxiety disorder: Secondary | ICD-10-CM | POA: Diagnosis not present

## 2022-08-27 DIAGNOSIS — G3184 Mild cognitive impairment, so stated: Secondary | ICD-10-CM | POA: Diagnosis not present

## 2022-08-27 DIAGNOSIS — F519 Sleep disorder not due to a substance or known physiological condition, unspecified: Secondary | ICD-10-CM | POA: Diagnosis not present

## 2022-08-27 DIAGNOSIS — E118 Type 2 diabetes mellitus with unspecified complications: Secondary | ICD-10-CM | POA: Diagnosis not present

## 2022-08-27 DIAGNOSIS — E039 Hypothyroidism, unspecified: Secondary | ICD-10-CM | POA: Diagnosis not present

## 2022-08-27 DIAGNOSIS — G43019 Migraine without aura, intractable, without status migrainosus: Secondary | ICD-10-CM | POA: Diagnosis not present

## 2022-08-27 DIAGNOSIS — E782 Mixed hyperlipidemia: Secondary | ICD-10-CM | POA: Diagnosis not present

## 2022-08-27 DIAGNOSIS — F330 Major depressive disorder, recurrent, mild: Secondary | ICD-10-CM | POA: Diagnosis not present

## 2022-08-27 DIAGNOSIS — I10 Essential (primary) hypertension: Secondary | ICD-10-CM | POA: Diagnosis not present

## 2022-09-03 ENCOUNTER — Encounter: Payer: Self-pay | Admitting: Orthopedic Surgery

## 2022-09-03 DIAGNOSIS — F330 Major depressive disorder, recurrent, mild: Secondary | ICD-10-CM | POA: Diagnosis not present

## 2022-09-16 ENCOUNTER — Other Ambulatory Visit: Payer: Self-pay | Admitting: Internal Medicine

## 2022-09-16 DIAGNOSIS — I10 Essential (primary) hypertension: Secondary | ICD-10-CM

## 2022-09-18 ENCOUNTER — Ambulatory Visit: Payer: Medicaid Other | Admitting: Orthopedic Surgery

## 2022-09-26 ENCOUNTER — Ambulatory Visit: Payer: Medicaid Other | Admitting: Internal Medicine

## 2022-09-29 ENCOUNTER — Encounter: Payer: Self-pay | Admitting: Internal Medicine

## 2022-09-30 ENCOUNTER — Ambulatory Visit: Payer: Self-pay | Admitting: *Deleted

## 2022-09-30 ENCOUNTER — Encounter (HOSPITAL_COMMUNITY): Payer: Self-pay

## 2022-09-30 ENCOUNTER — Emergency Department (HOSPITAL_COMMUNITY)
Admission: EM | Admit: 2022-09-30 | Discharge: 2022-09-30 | Discharge status: Against Medical Advice | Payer: Medicaid Other | Attending: Medical | Admitting: Medical

## 2022-09-30 ENCOUNTER — Telehealth: Payer: Self-pay | Admitting: Physical Medicine and Rehabilitation

## 2022-09-30 ENCOUNTER — Emergency Department (HOSPITAL_COMMUNITY): Payer: Medicaid Other

## 2022-09-30 ENCOUNTER — Other Ambulatory Visit: Payer: Self-pay

## 2022-09-30 DIAGNOSIS — M25519 Pain in unspecified shoulder: Secondary | ICD-10-CM | POA: Diagnosis not present

## 2022-09-30 DIAGNOSIS — I10 Essential (primary) hypertension: Secondary | ICD-10-CM | POA: Diagnosis not present

## 2022-09-30 DIAGNOSIS — Z5321 Procedure and treatment not carried out due to patient leaving prior to being seen by health care provider: Secondary | ICD-10-CM | POA: Diagnosis not present

## 2022-09-30 DIAGNOSIS — W19XXXA Unspecified fall, initial encounter: Secondary | ICD-10-CM | POA: Diagnosis not present

## 2022-09-30 DIAGNOSIS — M25552 Pain in left hip: Secondary | ICD-10-CM | POA: Diagnosis not present

## 2022-09-30 DIAGNOSIS — R102 Pelvic and perineal pain: Secondary | ICD-10-CM | POA: Diagnosis not present

## 2022-09-30 DIAGNOSIS — Z043 Encounter for examination and observation following other accident: Secondary | ICD-10-CM | POA: Diagnosis not present

## 2022-09-30 DIAGNOSIS — M25551 Pain in right hip: Secondary | ICD-10-CM | POA: Insufficient documentation

## 2022-09-30 DIAGNOSIS — R519 Headache, unspecified: Secondary | ICD-10-CM | POA: Diagnosis not present

## 2022-09-30 DIAGNOSIS — I6523 Occlusion and stenosis of bilateral carotid arteries: Secondary | ICD-10-CM | POA: Diagnosis not present

## 2022-09-30 DIAGNOSIS — S0990XA Unspecified injury of head, initial encounter: Secondary | ICD-10-CM | POA: Diagnosis not present

## 2022-09-30 LAB — CBC WITH DIFFERENTIAL/PLATELET
Abs Immature Granulocytes: 0.02 10*3/uL (ref 0.00–0.07)
Basophils Absolute: 0.1 10*3/uL (ref 0.0–0.1)
Basophils Relative: 1 %
Eosinophils Absolute: 0.1 10*3/uL (ref 0.0–0.5)
Eosinophils Relative: 1 %
HCT: 45 % (ref 36.0–46.0)
Hemoglobin: 15 g/dL (ref 12.0–15.0)
Immature Granulocytes: 0 %
Lymphocytes Relative: 25 %
Lymphs Abs: 2.4 10*3/uL (ref 0.7–4.0)
MCH: 31.7 pg (ref 26.0–34.0)
MCHC: 33.3 g/dL (ref 30.0–36.0)
MCV: 95.1 fL (ref 80.0–100.0)
Monocytes Absolute: 0.6 10*3/uL (ref 0.1–1.0)
Monocytes Relative: 6 %
Neutro Abs: 6.3 10*3/uL (ref 1.7–7.7)
Neutrophils Relative %: 67 %
Platelets: 361 10*3/uL (ref 150–400)
RBC: 4.73 MIL/uL (ref 3.87–5.11)
RDW: 13 % (ref 11.5–15.5)
WBC: 9.4 10*3/uL (ref 4.0–10.5)
nRBC: 0 % (ref 0.0–0.2)

## 2022-09-30 LAB — COMPREHENSIVE METABOLIC PANEL
ALT: 29 U/L (ref 0–44)
AST: 22 U/L (ref 15–41)
Albumin: 4.4 g/dL (ref 3.5–5.0)
Alkaline Phosphatase: 82 U/L (ref 38–126)
Anion gap: 10 (ref 5–15)
BUN: 14 mg/dL (ref 8–23)
CO2: 23 mmol/L (ref 22–32)
Calcium: 9.3 mg/dL (ref 8.9–10.3)
Chloride: 103 mmol/L (ref 98–111)
Creatinine, Ser: 0.96 mg/dL (ref 0.44–1.00)
GFR, Estimated: >60 mL/min (ref >60)
Glucose, Bld: 129 mg/dL — ABNORMAL HIGH (ref 70–99)
Potassium: 4.1 mmol/L (ref 3.5–5.1)
Sodium: 136 mmol/L (ref 135–145)
Total Bilirubin: 0.9 mg/dL (ref 0.3–1.2)
Total Protein: 8.2 g/dL — ABNORMAL HIGH (ref 6.5–8.1)

## 2022-09-30 NOTE — Telephone Encounter (Signed)
Left voicemail to return call to reschedule appointment

## 2022-09-30 NOTE — Telephone Encounter (Signed)
Patient's son Shanon Brow called advise patient need to reschedule her appointment. Shanon Brow asked that he be contacted to R/S the appointment. The number to contact Shanon Brow is [phone]

## 2022-09-30 NOTE — ED Provider Triage Note (Cosign Needed)
Emergency Medicine Provider Triage Evaluation Note  GRANT HENKES , a 61 y.o. female  was evaluated in triage.  Pt complains of fall 4 days ago and hit head after fall. -BT, -LOC. Denies any N/V, confusion. Family is requesting head CT. C/o bilateral hip pain.  Review of Systems  Positive: Hip pain Negative: Headache, N/v  Physical Exam  BP 119/79 (BP Location: Right Arm)   Pulse 87   Temp 97.9 F (36.6 C) (Oral)   Resp 16   LMP 06/06/2015   SpO2 94%  Gen:   Awake, no distress   Resp:  Normal effort  MSK:   Moves extremities without difficulty  Other:  No neurodeficits  Medical Decision Making  Medically screening exam initiated at 2:44 PM.  Appropriate orders placed.  Olyvia Gopal Pustejovsky was informed that the remainder of the evaluation will be completed by another provider, this initial triage assessment does not replace that evaluation, and the importance of remaining in the ED until their evaluation is complete.     Osvaldo Shipper, [place] 09/30/22 1446

## 2022-09-30 NOTE — Telephone Encounter (Signed)
Patient sent MyChart message and I responded to patient

## 2022-09-30 NOTE — Telephone Encounter (Signed)
Reason for Disposition  Injury (or injuries) that need emergency care    [facility] off porch and hit head against a brick wall.  Temporarily dazed.   Had to rest before it passed.   Felt like she was going to pass out.  Answer Assessment - Initial Assessment Questions 1. MECHANISM: "How did the fall happen?"     Shanon Brow, son calling in.  On DPR)  Mother fell a couple of days ago.   Going outside to meet a friend when mother went out lost balance and fell off side of back porch.   She hit her head on brick wall of the apartment.   Did not crack skull open.   She has a knot on the back of her head.   Scrapped up arm and both hands.  She did lose consciousness briefly.   Felt like she might pass out.    She came back around after a few minutes.   Did not go to the ED.    Pt. Refused to call 911 or be taken to the ED at the time.  "Per son she is fine now".   She is able to walk around and function properly.She fell on Thursday.  09/26/2022.      I've checked on her every day.    She came back inside and rested.    She was dizzy afterward but after resting  she was no longer dizzy.   No longer has a knot on her head.     She is here with me now.   No headaches.   She has not complained of a headache since the fall but she has migraines that she takes Tylenol for.  No visual changes.   2. DOMESTIC VIOLENCE AND ELDER ABUSE SCREENING: "Did you fall because someone pushed you or tried to hurt you?" If Yes, ask: "Are you safe now?"     No 3. ONSET: "When did the fall happen?" (e.g., minutes, hours, or days ago)     Thursday 09/26/2022 4. LOCATION: "What part of the body hit the ground?" (e.g., back, buttocks, head, hips, knees, hands, head, stomach)     Back of head against brick wall.     Both hands scrapped up and her arm.   Son cleaned the wounds with a first aid kit he had.  5. INJURY: "Did you hurt (injure) yourself when you fell?" If Yes, ask: "What did you injure? Tell me more about this?" (e.g.,  body area; type of injury; pain severity)"     Got a knot on back her head and scrapped up both hands and arm.   6. PAIN: "Is there any pain?" If Yes, ask: "How bad is the pain?" (e.g., Scale 1-10; or mild,  moderate, severe)   - NONE (0): No pain   - MILD (1-3): Doesn't interfere with normal activities    - MODERATE (4-7): Interferes with normal activities or awakens from sleep    - SEVERE (8-10): Excruciating pain, unable to do any normal activities      No bleeding on back of her head after the fall.      7. SIZE: For cuts, bruises, or swelling, ask: "How large is it?" (e.g., inches or centimeters)      Knot is gone.   Never did bleed.   Now son saying she did not lose consciousness.   She felt dazed.   She felt lightheaded and had to sit down and let it pass.  8. PREGNANCY: "Is there any chance you are pregnant?" "When was your last menstrual period?"     N/A 9. OTHER SYMPTOMS: "Do you have any other symptoms?" (e.g., dizziness, fever, weakness; new onset or worsening).      Transportation is an issue to getting to the ED.   [facility] would [facility].   I let him know to call 911 for transport if needed.   10. CAUSE: "What do you think caused the fall (or falling)?" (e.g., tripped, dizzy spell)       She lost her balance   Son agrees to taking her to ED but will try to get someone to take them.   If not he will call 911 to take her to the ED.  Protocols used: Falls and Falling-A-AH  Chief Complaint: [place] [facility] off porch hitting back of head on a brick wall.   Felt like she was going to pass out afterward.   Was dazed.    Not on blood thinners. Symptoms:  Pt says she is fine now.   Knot on back of head is gone.   Fall happened Thursday 09/26/2022 Frequency: Last Thurs. Pertinent Negatives: Patient denies Son said she lost consciousness at first then back traced and said she felt dazed but either way had to go inside and sit down because she felt like she was going to pass  out. Disposition: _0 ED /_1 Urgent Care (no appt availability in office) / _2 Appointment(In office/virtual)/ _3  [facility]/ _4 Home Care/ _5 Refused Recommended Disposition /_6 [facility]/ _7  Follow-up with PCP Additional Notes: Agreeable to going to ED.

## 2022-09-30 NOTE — ED Notes (Signed)
Pt has to catch last bus. Pt left ama

## 2022-09-30 NOTE — ED Triage Notes (Signed)
Pt BIB [facility] c/o a fall that happened 4 days ago. Pt states she did hit her head but no LOC and is not on a blood thinner. Pt is c/o Left shoulder pain and bilateral hip pain.

## 2022-10-01 DIAGNOSIS — F330 Major depressive disorder, recurrent, mild: Secondary | ICD-10-CM | POA: Diagnosis not present

## 2022-10-02 ENCOUNTER — Ambulatory Visit: Payer: Self-pay

## 2022-10-02 ENCOUNTER — Ambulatory Visit (INDEPENDENT_AMBULATORY_CARE_PROVIDER_SITE_OTHER): Payer: Medicaid Other

## 2022-10-02 ENCOUNTER — Ambulatory Visit (INDEPENDENT_AMBULATORY_CARE_PROVIDER_SITE_OTHER): Payer: Medicaid Other | Admitting: Surgical

## 2022-10-02 ENCOUNTER — Other Ambulatory Visit: Payer: Self-pay | Admitting: Internal Medicine

## 2022-10-02 DIAGNOSIS — M25512 Pain in left shoulder: Secondary | ICD-10-CM | POA: Diagnosis not present

## 2022-10-02 DIAGNOSIS — M25511 Pain in right shoulder: Secondary | ICD-10-CM

## 2022-10-02 DIAGNOSIS — K219 Gastro-esophageal reflux disease without esophagitis: Secondary | ICD-10-CM

## 2022-10-02 DIAGNOSIS — I10 Essential (primary) hypertension: Secondary | ICD-10-CM

## 2022-10-02 DIAGNOSIS — M7501 Adhesive capsulitis of right shoulder: Secondary | ICD-10-CM

## 2022-10-02 DIAGNOSIS — G8929 Other chronic pain: Secondary | ICD-10-CM

## 2022-10-02 DIAGNOSIS — E785 Hyperlipidemia, unspecified: Secondary | ICD-10-CM

## 2022-10-03 ENCOUNTER — Telehealth: Payer: Self-pay | Admitting: *Deleted

## 2022-10-03 ENCOUNTER — Telehealth: Payer: Self-pay

## 2022-10-03 ENCOUNTER — Other Ambulatory Visit: Payer: Self-pay | Admitting: Internal Medicine

## 2022-10-03 DIAGNOSIS — E1169 Type 2 diabetes mellitus with other specified complication: Secondary | ICD-10-CM

## 2022-10-03 NOTE — Telephone Encounter (Signed)
..   Medicaid Managed Care Note  10/03/2022 Name: [name] MRN: [id number] DOB: Aug 15, 1961  [name] is a 61 y.o. year old female who is a primary care patient of Ladell Pier, MD and is actively engaged with the care management team. I reached out to Jenita Seashore by phone today to assist with scheduling an initial visit with the RN Case Manager  Follow up plan: Unsuccessful telephone outreach attempt made. A HIPAA compliant phone message was left for the patient providing contact information and requesting a return call.  The care management team will reach out to the patient again over the next 7 days.      [place]

## 2022-10-03 NOTE — Telephone Encounter (Signed)
This prescription was filled on 10/02/2022.

## 2022-10-03 NOTE — Patient Outreach (Signed)
  Care Coordination [facility] Note Transition Care Management Unsuccessful Follow-up Telephone Call  Date of discharge and from where:  09/30/22 from [place] AMA  Attempts:  1st Attempt  Reason for unsuccessful TCM follow-up call:  Left voice message   [name] RN, BSN [place] RN Care Coordinator

## 2022-10-03 NOTE — Patient Outreach (Signed)
  Care Coordination [facility] Note Transition Care Management Follow-up Telephone Call Date of discharge and from where: 09/30/22 from [place] AMA How have you been since you were released from the hospital? "I'm doing ok" Any questions or concerns? No  Items Reviewed: Did the pt receive and understand the discharge instructions provided?  Patient left AMA due to transportation, she did not receive any instructions Medications obtained and verified?  No new medications prescribed Other? No  Any new allergies since your discharge? No  Dietary orders reviewed? No Do you have support at home? Yes   Home Care and Equipment/Supplies: Were home health services ordered? no If so, what is the name of the agency? N/A  Has the agency set up a time to come to the patient's home? not applicable Were any new equipment or medical supplies ordered?  No What is the name of the medical supply agency? N/A Were you able to get the supplies/equipment? not applicable Do you have any questions related to the use of the equipment or supplies? No  Functional Questionnaire: (I = Independent and D = Dependent) ADLs: I  Bathing/Dressing- I  Meal Prep- I  Eating- I  Maintaining continence- I  Transferring/Ambulation- I  Managing Meds- I  Follow up appointments reviewed:  PCP Hospital f/u appt confirmed? No  Scheduled to see PCP on 10/29/22 . [facility] f/u appt confirmed? No  . Are transportation arrangements needed? No Patient will arrange transportation with [facility] If their condition worsens, is the pt aware to call PCP or go to the Emergency Dept.? Yes Was the patient provided with contact information for the PCP's office or ED? No Was to pt encouraged to call back with questions or concerns? Yes  [name] RN, BSN [place]  [employer] manager

## 2022-10-04 ENCOUNTER — Other Ambulatory Visit: Payer: Self-pay

## 2022-10-04 ENCOUNTER — Encounter: Payer: Self-pay | Admitting: Surgical

## 2022-10-04 DIAGNOSIS — M25512 Pain in left shoulder: Secondary | ICD-10-CM

## 2022-10-04 MED ORDER — METHYLPREDNISOLONE ACETATE 40 MG/ML IJ SUSP
40.0000 mg | INTRAMUSCULAR | Status: AC | PRN
  Administered 2022-10-02: 40 mg via INTRA_ARTICULAR

## 2022-10-04 MED ORDER — LIDOCAINE HCL 1 % IJ SOLN
5.0000 mL | INTRAMUSCULAR | Status: AC | PRN
  Administered 2022-10-02: 5 mL

## 2022-10-04 MED ORDER — BUPIVACAINE HCL 0.5 % IJ SOLN
9.0000 mL | INTRAMUSCULAR | Status: AC | PRN
  Administered 2022-10-02: 9 mL via INTRA_ARTICULAR

## 2022-10-04 NOTE — Progress Notes (Signed)
Follow-up Office Visit Note   Patient: [name]           Date of Birth: Apr 10, 1961           MRN: [id number] Visit Date: 10/02/2022 Requested by: Ladell Pier, MD [place],  [place] [address] PCP: Ladell Pier, MD  Subjective: Chief Complaint  Patient presents with   Right Shoulder - Pain    HPI: [name] is a 61 y.o. female who returns to the office for follow-up visit.    Plan at last visit was: Impression is right shoulder adhesive capsulitis. She does have diminished passive range of motion on the right compared to the left. Plan is ultrasound-guided intra-articular injection today with 8-week return and consideration for or against repeat injection versus manipulation and rotator interval release.   Since then, patient notes she had great relief of right shoulder pain for 2 months.  After that, pain began to slowly return.  She still complains of decreased range of motion of the shoulder.  She has had several falls since last visit with the most recent fall on 09/26/2022 that really kicked off the recurrence of her shoulder pain.  She does not have any new difficulty lifting her arm.  Taking Tylenol as needed for pain control.  She also describes left shoulder pain.  Both shoulders wake her up from sleep at night.  She has mechanical clicking symptoms in the left shoulder but not the right.  Denies any history of prior surgery to the left shoulder.  She does have history of diabetes that is overall controlled as well as thyroid disease.              ROS: All systems reviewed are negative as they relate to the chief complaint within the history of present illness.  Patient denies fevers or chills.  Assessment & Plan: Visit Diagnoses:  1. Adhesive capsulitis of right shoulder   2. Chronic right shoulder pain   3. Left shoulder pain, unspecified chronicity     Plan: [name] is a 61 y.o. female who returns to the office for  follow-up visit for right shoulder adhesive capsulitis and left shoulder pain.  Plan from last visit was noted above in HPI.  They now return with substantial improvement from right shoulder glenohumeral injection administered on 08/07/2022.  Pain returned within the last 1 to 2 weeks, especially worsening after fall on 09/26/2022.  There is no new radiographic findings on right shoulder radiographs today and she does not have any new weakness since her fall.  Impression is return of pain from adhesive capsulitis.  She would like to have right shoulder glenohumeral injection today which she tolerated well.  This was administered under ultrasound guidance with excellent flow of the injection without resistance.  She will continue with home exercise program.  Additionally, she complains of left shoulder pain and has some rotator cuff weakness noted on exam today.  She has coarseness in the shoulder with passive motion as well as nighttime pain concerning for rotator cuff pathology.  Radiographs are negative for any significant pathology.  Plan for further evaluation with MRI arthrogram of the left shoulder to rule out rotator cuff tear.  Follow-up after MRI to review results.  Follow-Up Instructions: No follow-ups on file.   Orders:  Orders Placed This Encounter  Procedures   XR Shoulder Right   US Guided Needle Placement - No Linked Charges   XR  Shoulder Left   No orders of the defined types were placed in this encounter.     Procedures: Large Joint Inj: R glenohumeral on 10/02/2022 7:42 AM Indications: diagnostic evaluation and pain Details: 22 G 1.5 in and 3.5 in needle, ultrasound-guided posterior approach  Arthrogram: No  Medications: 9 mL bupivacaine 0.5 %; 40 mg methylPREDNISolone acetate 40 MG/ML; 5 mL lidocaine 1 % Outcome: tolerated well, no immediate complications Procedure, treatment alternatives, risks and benefits explained, specific risks discussed. Consent was given by the  patient. Immediately prior to procedure a time out was called to verify the correct patient, procedure, equipment, support staff and site/side marked as required. Patient was prepped and draped in the usual sterile fashion.       Clinical Data: No additional findings.  Objective: Vital Signs: LMP 06/06/2015   Physical Exam:  Constitutional: Patient appears well-developed HEENT:  Head: Normocephalic Eyes:EOM are normal Neck: Normal range of motion Cardiovascular: Normal rate Pulmonary/chest: Effort normal Neurologic: Patient is alert Skin: Skin is warm Psychiatric: Patient has normal mood and affect  Ortho Exam: Ortho exam demonstrates right shoulder with 45 degrees X rotation, 90 degrees abduction, 150 degrees forward flexion.  This compared to the left shoulder with 50 degrees external rotation, 110 degrees abduction, 150 degrees forward flexion.  There is small amount of coarseness noted with passive motion of the left shoulder but none in the right shoulder.  She has 4/5 external rotation strength of the left shoulder relative to the right with 5/5 supraspinatus and subscapularis strength bilaterally.  Negative Hornblower sign.  Negative external rotation lag sign.  She has tenderness over the bicipital groove moderately in both shoulders.  No tenderness over the [facility] joint bilaterally.  No tenderness over the acromion bilaterally.  No tenderness over the axial cervical spine.  Negative Lhermitte sign.  Negative Spurling sign.  5/5 motor strength of bilateral EPL, FPL, finger abduction, grip strength testing, pronation/admission, bicep, tricep, deltoid.  Specialty Comments:  EXAM: MRI LUMBAR SPINE WITHOUT CONTRAST   TECHNIQUE: Multiplanar, multisequence MR imaging of the lumbar spine was performed. No intravenous contrast was administered.   COMPARISON:  05/16/2017   FINDINGS: Segmentation:  Standard.   Alignment:  Physiologic.   Vertebrae:  No fracture, evidence of  discitis, or bone lesion.   Conus medullaris and cauda equina: Conus extends to the L1 level. Conus and cauda equina appear normal.   Paraspinal and other soft tissues: Negative for perispinal mass or inflammation   Disc levels:   T12- L1: Unremarkable.   L1-L2: Unremarkable.   L2-L3: Small left inferior foraminal protrusion. No change or neural impingement   L3-L4: Small left inferior foraminal protrusion. Minor disc bulging. No change or neural impingement.   L4-L5: Minor annulus bulging   L5-S1:Minor annulus bulging   IMPRESSION: Stable mild degenerative changes when compared to 2018. No neural impingement     Electronically Signed   By: Jorje Guild M.D.   On: 03/31/2022 04:09  Imaging: No results found.   PMFS History: Patient Active Problem List   Diagnosis Date Noted   Complete tear of right rotator cuff 05/16/2022   Adhesive capsulitis of right shoulder 03/14/2022   Influenza vaccine needed 10/31/2020   Need for diphtheria-tetanus-pertussis (Tdap) vaccine 10/31/2020   Hyperlipidemia associated with type 2 diabetes mellitus ([place]) 10/31/2020   Overweight (BMI 25.0-29.9) 10/31/2020   Type 2 diabetes mellitus without complication, without long-term current use of insulin ([place]) 07/31/2020   OSA (obstructive sleep apnea) 02/02/2020   Spondylosis  without myelopathy or radiculopathy, lumbar region 09/17/2017   Essential hypertension 09/05/2017   Chronic bilateral low back pain with left-sided sciatica 09/05/2017   HTN (hypertension) 05/05/2017   Hypothyroidism 02/24/2017   Mixed hyperlipidemia 02/24/2017   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   Sleep related headaches 01/23/2017   Snoring 01/23/2017   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   Chronic headaches 10/30/2016   Obesity (BMI 30.0-34.9) 05/24/2016   PMB (postmenopausal bleeding) 05/24/2016   Back pain of lumbar region with sciatica 09/28/2015   Past Medical History:  Diagnosis Date    Anxiety    Arthritis    Depression    GERD (gastroesophageal reflux disease)    Hyperlipidemia    Hypertension    Migraines    Thyroid disease     Family History  Problem Relation Age of Onset   Headache Neg Hx    Colon cancer Neg Hx    Esophageal cancer Neg Hx    Rectal cancer Neg Hx    Stomach cancer Neg Hx    Breast cancer Neg Hx     Past Surgical History:  Procedure Laterality Date   BREAST CYST EXCISION Right 1980   BREAST CYST EXCISION Left 1985   NO PAST SURGERIES     Social History   Occupational History   Occupation: Unemployed  Tobacco Use   Smoking status: Never   Smokeless tobacco: Never  Substance and Sexual Activity   Alcohol use: No    Alcohol/week: 0.0 standard drinks of alcohol   Drug use: No   Sexual activity: Not Currently    Partners: Male

## 2022-10-08 ENCOUNTER — Telehealth: Payer: Self-pay

## 2022-10-08 NOTE — Telephone Encounter (Signed)
..   Medicaid Managed Care Note  10/08/2022 Name: [name] MRN: [id number] DOB: 07/26/1961  [name] is a 61 y.o. year old female who is a primary care patient of Ladell Pier, MD and is actively engaged with the care management team. I reached out to Jenita Seashore by phone today to assist with scheduling an initial visit with the RN Case Manager  Follow up plan: Unsuccessful telephone outreach attempt made. A HIPAA compliant phone message was left for the patient providing contact information and requesting a return call.  The care management team will reach out to the patient again over the next 7-14 days.   [place]

## 2022-10-17 ENCOUNTER — Telehealth: Payer: Self-pay

## 2022-10-17 NOTE — Telephone Encounter (Signed)
Note created in error.

## 2022-10-23 ENCOUNTER — Ambulatory Visit: Payer: Medicaid Other | Admitting: Physical Medicine and Rehabilitation

## 2022-10-23 DIAGNOSIS — F411 Generalized anxiety disorder: Secondary | ICD-10-CM | POA: Diagnosis not present

## 2022-10-25 ENCOUNTER — Ambulatory Visit (INDEPENDENT_AMBULATORY_CARE_PROVIDER_SITE_OTHER): Payer: Medicaid Other | Admitting: Physical Medicine and Rehabilitation

## 2022-10-25 ENCOUNTER — Encounter: Payer: Self-pay | Admitting: Physical Medicine and Rehabilitation

## 2022-10-25 VITALS — BP 99/66 | HR 75

## 2022-10-25 DIAGNOSIS — M7918 Myalgia, other site: Secondary | ICD-10-CM | POA: Diagnosis not present

## 2022-10-25 DIAGNOSIS — M546 Pain in thoracic spine: Secondary | ICD-10-CM | POA: Diagnosis not present

## 2022-10-25 DIAGNOSIS — M797 Fibromyalgia: Secondary | ICD-10-CM

## 2022-10-25 DIAGNOSIS — M5416 Radiculopathy, lumbar region: Secondary | ICD-10-CM | POA: Diagnosis not present

## 2022-10-25 DIAGNOSIS — G8929 Other chronic pain: Secondary | ICD-10-CM | POA: Diagnosis not present

## 2022-10-25 NOTE — Progress Notes (Unsigned)
[name] - 62 y.o. female MRN [id number]  Date of birth: 05/05/1961  Office Visit Note: Visit Date: 10/25/2022 PCP: Ladell Pier, MD Referred by: Ladell Pier, MD  Subjective: Chief Complaint  Patient presents with   Lower Back - Injury, Pain    Recent falls 09/2022, pain 3/10   HPI: [name] is a 62 y.o. female who comes in today for evaluation of chronic bilateral lower back pain radiating up to thoracic back and intermittently down entire left leg to foot. Patient reports pain has been ongoing for several years and worsens with movement, activity and standing. Some relief of pain with home exercise regimen, rest and use of medications. She continues with Cymbalta. History of formal physical therapy at [facility], she reports minimal relief of pain with these treatments. Lumbar MRI imaging from June of 2023 exhibits stable mild degenerative changes when compared to 2018. No neural Impingement. No high grade spinal canal stenosis. She has undergone multiple lumbar epidural steroid injections in our office over the years including transforaminal, facet joint blocks and radiofrequency ablation. Patient reports no relief of pain with these procedures. Patient states she would like to discuss other treatment options today, specifically spinal cord stimulator placement, states she has a close friend that has had good pain relief with stimulator. Patient denies focal weakness, numbness and tingling.   Of note, patient does reports multiple episodes of falls since October, states she did take sleeping medication several months ago, became dizzy and fell in the floor. Other falls involve her tripping over objects on the floor and losing her balance on the stairs. She did seek care post fall on 12/11 in the emergency department, however she left before treatment completed. CT of her head from ED visit was negative. She plans to discuss frequent falls  with her primary care provider during upcoming visit.          Review of Systems  Musculoskeletal:  Positive for back pain and myalgias.  Neurological:  Negative for tingling, sensory change, focal weakness and weakness.  All other systems reviewed and are negative.  Otherwise per HPI.  Assessment & Plan: Visit Diagnoses:    ICD-10-CM   1. Lumbar radiculopathy  M54.16 Ambulatory referral to Pain Clinic    2. Chronic bilateral thoracic back pain  M54.6 Ambulatory referral to Pain Clinic   G89.29     3. Myofascial pain syndrome  M79.18 Ambulatory referral to Pain Clinic    4. Fibromyalgia  M79.7 Ambulatory referral to Pain Clinic       Plan: Findings:  Chronic, worsening and severe bilateral lower back pain radiating up to thoracic back and intermittently down entire left leg to foot. She continues to have severe pain.  Patient has failed conservative therapies and received minimal pain relief with interventional spine procedures. Her clinical presentation and exam are complex. Anatomically her lumbar spine looks good for her age, there is no nerve impingement, only mild degenerative changes. 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 At this point, we do not feel repeating lumbar injections is the right answer due to only minimal short term relief of pain. I instructed patient to continue with Cymbalta. I did speak with her about spinal cord stimulator placement today, she seems to be interested in this procedure, however I am unsure she would be ideal candidate. We feel she would benefit from a more comprehensive  pain management approach. She is open to other suggestions for pain management at this time. I did place referral to [facility] today in [place]. I instructed patient that this practice will call her to set up appointment. She is agreeable with plan, we are happy to see her back as needed. No red  flag symptoms noted upon exam today.     Meds & Orders: No orders of the defined types were placed in this encounter.   Orders Placed This Encounter  Procedures   Ambulatory referral to Pain Clinic    Follow-up: Return if symptoms worsen or fail to improve.   Procedures: No procedures performed      Clinical History: EXAM: MRI LUMBAR SPINE WITHOUT CONTRAST   TECHNIQUE: Multiplanar, multisequence MR imaging of the lumbar spine was performed. No intravenous contrast was administered.   COMPARISON:  05/16/2017   FINDINGS: Segmentation:  Standard.   Alignment:  Physiologic.   Vertebrae:  No fracture, evidence of discitis, or bone lesion.   Conus medullaris and cauda equina: Conus extends to the L1 level. Conus and cauda equina appear normal.   Paraspinal and other soft tissues: Negative for perispinal mass or inflammation   Disc levels:   T12- L1: Unremarkable.   L1-L2: Unremarkable.   L2-L3: Small left inferior foraminal protrusion. No change or neural impingement   L3-L4: Small left inferior foraminal protrusion. Minor disc bulging. No change or neural impingement.   L4-L5: Minor annulus bulging   L5-S1:Minor annulus bulging   IMPRESSION: Stable mild degenerative changes when compared to 2018. No neural impingement     Electronically Signed   By: Jorje Guild M.D.   On: 03/31/2022 04:09   She reports that she has never smoked. She has never used smokeless tobacco.  Recent Labs    12/27/21 1550 05/20/22 1113  HGBA1C 6.4* 6.3    Objective:  VS:  HT:    WT:   BMI:     BP:99/66  HR:75bpm  TEMP: ( )  RESP:96 % Physical Exam Vitals and nursing note reviewed.  HENT:     Head: Normocephalic and atraumatic.     Right Ear: External ear normal.     Left Ear: External ear normal.     Nose: Nose normal.     Mouth/Throat:     Mouth: Mucous membranes are moist.  Eyes:     Extraocular Movements: Extraocular movements intact.  Cardiovascular:      Rate and Rhythm: Normal rate.     Pulses: Normal pulses.  Pulmonary:     Effort: Pulmonary effort is normal.  Abdominal:     General: Abdomen is flat. There is no distension.  Musculoskeletal:        General: Tenderness present.     Cervical back: Normal range of motion.     Comments: No discomfort noted with flexion, extension and side-to-side rotation. Patient has good strength in the upper extremities including 5 out of 5 strength in wrist extension, long finger flexion and APB.  There is no atrophy of the hands intrinsically. Tenderness noted upon palpation of bilateral thoracic paraspinal regions. Sensation intact bilaterally    Skin:    General: Skin is warm and dry.     Capillary Refill: Capillary refill takes less than 2 seconds.  Neurological:     General: No focal deficit present.     Mental Status: She is alert and oriented to person, place, and time.  Psychiatric:  Mood and Affect: Mood normal.        Behavior: Behavior normal.     Ortho Exam  Imaging: No results found.  Past Medical/Family/Surgical/Social History: Medications & Allergies reviewed per EMR, new medications updated. Patient Active Problem List   Diagnosis Date Noted   Complete tear of right rotator cuff 05/16/2022   Adhesive capsulitis of right shoulder 03/14/2022   Influenza vaccine needed 10/31/2020   Need for diphtheria-tetanus-pertussis (Tdap) vaccine 10/31/2020   Hyperlipidemia associated with type 2 diabetes mellitus ([place]) 10/31/2020   Overweight (BMI 25.0-29.9) 10/31/2020   Type 2 diabetes mellitus without complication, without long-term current use of insulin ([place]) 07/31/2020   OSA (obstructive sleep apnea) 02/02/2020   Spondylosis without myelopathy or radiculopathy, lumbar region 09/17/2017   Essential hypertension 09/05/2017   Chronic bilateral low back pain with left-sided sciatica 09/05/2017   HTN (hypertension) 05/05/2017   Hypothyroidism 02/24/2017   Mixed hyperlipidemia  02/24/2017   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   Sleep related headaches 01/23/2017   Snoring 01/23/2017   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   Chronic headaches 10/30/2016   Obesity (BMI 30.0-34.9) 05/24/2016   PMB (postmenopausal bleeding) 05/24/2016   Back pain of lumbar region with sciatica 09/28/2015   Past Medical History:  Diagnosis Date   Anxiety    Arthritis    Depression    GERD (gastroesophageal reflux disease)    Hyperlipidemia    Hypertension    Migraines    Thyroid disease    Family History  Problem Relation Age of Onset   Headache Neg Hx    Colon cancer Neg Hx    Esophageal cancer Neg Hx    Rectal cancer Neg Hx    Stomach cancer Neg Hx    Breast cancer Neg Hx    Past Surgical History:  Procedure Laterality Date   BREAST CYST EXCISION Right 1980   BREAST CYST EXCISION Left 1985   NO PAST SURGERIES     Social History   Occupational History   Occupation: Unemployed  Tobacco Use   Smoking status: Never   Smokeless tobacco: Never  Substance and Sexual Activity   Alcohol use: No    Alcohol/week: 0.0 standard drinks of alcohol   Drug use: No   Sexual activity: Not Currently    Partners: Male

## 2022-10-29 ENCOUNTER — Ambulatory Visit: Payer: Medicaid Other | Admitting: Internal Medicine

## 2022-10-29 DIAGNOSIS — F330 Major depressive disorder, recurrent, mild: Secondary | ICD-10-CM | POA: Diagnosis not present

## 2022-10-31 ENCOUNTER — Ambulatory Visit
Admission: RE | Admit: 2022-10-31 | Discharge: 2022-10-31 | Disposition: A | Discharge status: Home / Self Care | Payer: Medicaid Other | Source: Ambulatory Visit | Attending: Surgical | Admitting: Surgical

## 2022-10-31 DIAGNOSIS — M25512 Pain in left shoulder: Secondary | ICD-10-CM | POA: Diagnosis not present

## 2022-10-31 DIAGNOSIS — S46012A Strain of muscle(s) and tendon(s) of the rotator cuff of left shoulder, initial encounter: Secondary | ICD-10-CM | POA: Diagnosis not present

## 2022-10-31 MED ORDER — IOPAMIDOL (ISOVUE-M 200) INJECTION 41%
15.0000 mL | Freq: Once | INTRAMUSCULAR | Status: AC
  Administered 2022-10-31: 15 mL via INTRA_ARTICULAR

## 2022-11-01 ENCOUNTER — Other Ambulatory Visit: Payer: Medicaid Other | Admitting: *Deleted

## 2022-11-01 DIAGNOSIS — F331 Major depressive disorder, recurrent, moderate: Secondary | ICD-10-CM | POA: Diagnosis not present

## 2022-11-01 DIAGNOSIS — F411 Generalized anxiety disorder: Secondary | ICD-10-CM | POA: Diagnosis not present

## 2022-11-01 NOTE — Patient Instructions (Signed)
Visit Information  Ms. Delight Stare Lund  - as a part of your Medicaid benefit, you are eligible for care management and care coordination services at no cost or copay. I was unable to reach you by phone today but would be happy to help you with your health related needs. Please feel free to call me @ [phone].   A member of the Managed Medicaid care management team will reach out to you again over the next 14 days.   [name] RN, BSN [place]  [employer] manager

## 2022-11-01 NOTE — Patient Outreach (Signed)
  Medicaid Managed Care   Unsuccessful Attempt Note   11/01/2022 Name: [name] MRN: [id number] DOB: [birth year]  Referred by: Ladell Pier, MD Reason for referral : High Risk Managed Medicaid (Unsuccessful RNCM initial telephone outreach)   An unsuccessful telephone outreach was attempted today. The patient was referred to the case management team for assistance with care management and care coordination.    Follow Up Plan: A HIPAA compliant phone message was left for the patient providing contact information and requesting a return call. and The Managed Medicaid care management team will reach out to the patient again over the next 14 days.    [name] RN, BSN [place]  [employer] manager

## 2022-11-06 ENCOUNTER — Telehealth: Payer: Self-pay

## 2022-11-06 NOTE — Telephone Encounter (Signed)
..  Medicaid Managed Care Note  11/06/2022 Name: [name] MRN: [id number] DOB: 06-Apr-1961  [name] is a 62 y.o. year old female who is a primary care patient of Ladell Pier, MD and is actively engaged with the care management team. I reached out to Jenita Seashore by phone today to assist with re-scheduling an initial visit with the RN Case Manager  Follow up plan: Unsuccessful telephone outreach attempt made. A HIPAA compliant phone message was left for the patient providing contact information and requesting a return call.  The care management team will reach out to the patient again over the next 7 days.   [place]

## 2022-11-15 ENCOUNTER — Encounter: Payer: Self-pay | Admitting: *Deleted

## 2022-11-15 ENCOUNTER — Other Ambulatory Visit: Payer: Medicaid Other | Admitting: *Deleted

## 2022-11-15 NOTE — Patient Outreach (Signed)
Medicaid Managed Care   Nurse Care Manager Note  11/15/2022 Name:  [name] MRN:  [id number] DOB:  08/12/1961  [name] is an 62 y.o. year old female who is a primary patient of Patricia Pier, MD.  The [facility] Managed Care Coordination team was consulted for assistance with:    DMII Pain  [name] was given information about Medicaid Managed Care Coordination team services today. [place] Patient agreed to services and verbal consent obtained.  Engaged with patient by telephone for initial visit in response to provider referral for case management and/or care coordination services.   Assessments/Interventions:  Review of past medical history, allergies, medications, health status, including review of consultants reports, laboratory and other test data, was performed as part of comprehensive evaluation and provision of chronic care management services.  SDOH (Social Determinants of Health) assessments and interventions performed: SDOH Interventions    Flowsheet Row Patient Outreach Telephone from 11/15/2022 in [place] Telephone from 10/03/2022 in [place]  SDOH Interventions    Food Insecurity Interventions Intervention Not Indicated --  Housing Interventions Intervention Not Indicated --  Transportation Interventions Intervention Not Indicated Intervention Not Indicated  Utilities Interventions Intervention Not Indicated --       Care Plan  Allergies  Allergen Reactions   Iron Diarrhea   Metformin And Related Nausea And Vomiting   Penicillins Nausea And Vomiting    Has patient had a PCN reaction causing immediate rash, facial/tongue/throat swelling, SOB or lightheadedness with hypotension: No Has patient had a PCN reaction causing severe rash involving mucus membranes or skin necrosis: No Has patient had a PCN reaction that required hospitalization No Has patient had a PCN reaction  occurring within the last 10 years: No If all of the above answers are "NO", then may proceed with Cephalosporin use.    Medications Reviewed Today     Reviewed by Patricia Montane, RN (Registered Nurse) on 11/15/22 at 1253  Med List Status: <None>   Medication Order Taking? Sig Documenting Provider Last Dose Status Informant  Accu-Chek FastClix Lancets MISC [id number] Yes Use as instructed to check blood sugar three times daily Patricia Pier, MD Taking Active   acetaminophen (TYLENOL) 325 MG tablet [id number] Yes Take 2 tablets (650 mg total) by mouth every 6 (six) hours as needed for moderate pain. Patricia Spruce, FNP Taking Active   amitriptyline (ELAVIL) 25 MG tablet [id number] Yes Take 1 tablet (25 mg total) by mouth at bedtime. Patricia Pier, MD Taking Active   amLODipine (NORVASC) 10 MG tablet [id number] Yes Take 1 tablet (10 mg total) by mouth daily. Patricia Pier, MD Taking Active   atorvastatin (LIPITOR) 80 MG tablet [id number] Yes Take 1 tablet (80 mg total) by mouth daily. Patricia Pier, MD Taking Active   Blood Glucose Monitoring Suppl (ACCU-CHEK GUIDE ME) w/Device KIT [id number] Yes 1 kit by Does not apply route daily. Use as instructed to check blood sugar daily. E11.9 Patricia Pier, MD Taking Active   busPIRone (BUSPAR) 10 MG tablet [id number] Yes Take 10 mg by mouth 3 (three) times daily. [provider] Taking Active   Dulaglutide (TRULICITY) 1.5 OB/0.9GG [place] [facility] [id number] Yes INJECT 1.'5MG'$  INTO THE SKIN Once weekly Patricia Pier, MD Taking Active   DULoxetine (CYMBALTA) 30 MG capsule [id number] Yes Take 1 tablet (30 mg) twice a day. Patricia Bears, NP Taking Active  Med Note ([name] A   Fri Nov 15, 2022 12:50 PM) Instructed to take '60mg'$  once daily  glucose blood (ACCU-CHEK GUIDE) test strip [id number] Yes USE TO test THREE TIMES DAILY Patricia Pier, MD Taking Active   levothyroxine (SYNTHROID) 50 MCG tablet [id number] Yes  TAKE ONE TABLET BY MOUTH EVERY MORNING Patricia Pier, MD Taking Active   lisinopril (ZESTRIL) 20 MG tablet [id number] Yes TAKE ONE TABLET BY MOUTH EVERY DAY Patricia Pier, MD Taking Active   omeprazole (PRILOSEC) 20 MG capsule [id number] Yes TAKE ONE CAPSULE BY MOUTH EVERY DAY Patricia Pier, MD Taking Active   propranolol (INDERAL) 80 MG tablet [id number] Yes TAKE ONE TABLET BY MOUTH TWICE DAILY Patricia Pier, MD Taking Active             Patient Active Problem List   Diagnosis Date Noted   Complete tear of right rotator cuff 05/16/2022   Adhesive capsulitis of right shoulder 03/14/2022   Influenza vaccine needed 10/31/2020   Need for diphtheria-tetanus-pertussis (Tdap) vaccine 10/31/2020   Hyperlipidemia associated with type 2 diabetes mellitus ([place]) 10/31/2020   Overweight (BMI 25.0-29.9) 10/31/2020   Type 2 diabetes mellitus without complication, without long-term current use of insulin ([place]) 07/31/2020   OSA (obstructive sleep apnea) 02/02/2020   Spondylosis without myelopathy or radiculopathy, lumbar region 09/17/2017   Essential hypertension 09/05/2017   Chronic bilateral low back pain with left-sided sciatica 09/05/2017   HTN (hypertension) 05/05/2017   Hypothyroidism 02/24/2017   Mixed hyperlipidemia 02/24/2017   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   Sleep related headaches 01/23/2017   Snoring 01/23/2017   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   Chronic headaches 10/30/2016   Obesity (BMI 30.0-34.9) 05/24/2016   PMB (postmenopausal bleeding) 05/24/2016   Back pain of lumbar region with sciatica 09/28/2015    Conditions to be addressed/monitored per PCP order:  DMII and Pain  Care Plan : RN Care Manager Plan of Care  Updates made by Patricia Montane, RN since 11/15/2022 12:00 AM     Problem: Health Management needs related to DM and Pain      Long-Range Goal: Development of Plan of Care to address Health Management needs related to DM and Pain    Start Date: 11/15/2022  Expected End Date: 02/13/2023  Note:   Current Barriers:  Chronic Disease Management support and education needs related to DMII and Pain Financial Constraints. Patient needs help affording copays for PT  RNCM Clinical Goal(s):  Patient will verbalize understanding of plan for management of DMII and Pain as evidenced by Patient reports take all medications exactly as prescribed and will call provider for medication related questions as evidenced by Patient reports and EMR documentation    attend all scheduled medical appointments: 11/21/22 with PCP and 12/02/22 with Orthopedic to review MRI results as evidenced by Provider documentation        continue to work with [place] and/or Social Worker to address care management and care coordination needs related to DMII and Pain as evidenced by adherence to CM Team Scheduled appointments     through collaboration with RN Care manager, provider, and care team.   Interventions: Inter-disciplinary care team collaboration (see longitudinal plan of care) Evaluation of current treatment plan related to  self management and patient's adherence to plan as established by provider   Diabetes:  (Status: New goal.) Long Term Goal   Lab Results  Component Value Date   HGBA1C 6.3 05/20/2022   @  Assessed patient's understanding of A1c goal: <7% Provided education to patient about basic DM disease process; Reviewed medications with patient and discussed importance of medication adherence;        Reviewed prescribed diet with patient advised patient to increase vegetables and fruit in her diet; Discussed plans with patient for ongoing care management follow up and provided patient with direct contact information for care management team;      Review of patient status, including review of consultants reports, relevant laboratory and other test results, and medications completed;       Assessed social determinant of health barriers;         Collaborate with PCP for referral to nutrition  Pain:  (Status: New goal.) Long Term Goal  Pain assessment performed Medications reviewed Reviewed provider established plan for pain management; Discussed importance of adherence to all scheduled medical appointments; Advised patient to report to care team affect of pain on daily activities; Discussed use of relaxation techniques and/or diversional activities to assist with pain reduction (distraction, imagery, relaxation, massage, acupressure, TENS, heat, and cold application; Advised patient to discuss PT referral to improve balance with provider; Assessed social determinant of health barriers;  Referral to BSW for assistance with office copays, scheduled telephone appointment on 11/20/22 Advised patient to follow up with referral to Pain Management and arrange transportation with [facility] medical transportation  Patient Goals/Self-Care Activities: Take medications as prescribed   Attend all scheduled provider appointments Call provider office for new concerns or questions  Work with the social worker to address care coordination needs and will continue to work with the clinical team to address health care and disease management related needs take the blood sugar meter to all doctor visits drink 6 to 8 glasses of water each day fill half of plate with vegetables       Follow Up:  Patient agrees to Care Plan and Follow-up.  Plan: The Managed Medicaid care management team will reach out to the patient again over the next 30 days.  Date/time of next scheduled RN care management/care coordination outreach:  12/18/22 @ 10:30am  [name] RN, BSN [place] RN Care Coordinator

## 2022-11-15 NOTE — Patient Instructions (Signed)
Visit Information  [name] was given information about Medicaid Managed Care team care coordination services as a part of their [place] Medicaid benefit. [name] verbally consented to engagement with the [facility] Managed Care team.   If you are experiencing a medical emergency, please call 911 or report to your local emergency department or urgent care.   If you have a non-emergency medical problem during routine business hours, please contact your provider's office and ask to speak with a nurse.   For questions related to your [facility], please call: [phone] or visit the homepage here: [email or web address]  If you would like to schedule transportation through your [facility], please call the following number at least 2 days in advance of your appointment: [phone]   Rides for urgent appointments can also be made after hours by calling Member Services.  Call the [facility] at [phone], at any time, 24 hours a day, 7 days a week. If you are in danger or need immediate medical attention call 911.  If you would like help to quit smoking, call 1-800-QUIT-NOW 331-473-3174) OR Espaol: 1-855-Djelo-Ya (2-876-811-5726) o para ms informacin haga clic aqu or Text READY to 200-400 to register via text  [name],   Please see education materials related to DM provided by MyChart link.  Patient verbalizes understanding of instructions and care plan provided today and agrees to view in [place]. Active MyChart status and patient understanding of how to access instructions and care plan via MyChart confirmed with patient.     Telephone follow up appointment with Managed Medicaid care management team member scheduled for:12/18/22 @ 10:30am  Patricia Joiner RN, BSN [place] RN Care  Coordinator   Following is a copy of your plan of care:  Care Plan : RN Care Manager Plan of Care  Updates made by Patricia Montane, RN since 11/15/2022 12:00 AM     Problem: Health Management needs related to DM and Pain      Long-Range Goal: Development of Plan of Care to address Health Management needs related to DM and Pain   Start Date: 11/15/2022  Expected End Date: 02/13/2023  Note:   Current Barriers:  Chronic Disease Management support and education needs related to DMII and Pain Financial Constraints. Patient needs help affording copays for PT  RNCM Clinical Goal(s):  Patient will verbalize understanding of plan for management of DMII and Pain as evidenced by Patient reports take all medications exactly as prescribed and will call provider for medication related questions as evidenced by Patient reports and EMR documentation    attend all scheduled medical appointments: 11/21/22 with PCP and 12/02/22 with Orthopedic to review MRI results as evidenced by Provider documentation        continue to work with [place] and/or Social Worker to address care management and care coordination needs related to DMII and Pain as evidenced by adherence to CM Team Scheduled appointments     through collaboration with RN Care manager, provider, and care team.   Interventions: Inter-disciplinary care team collaboration (see longitudinal plan of care) Evaluation of current treatment plan related to  self management and patient's adherence to plan as established by provider   Diabetes:  (Status: New goal.) Long Term Goal   Lab Results  Component Value Date   HGBA1C 6.3 05/20/2022   @ Assessed patient's understanding of A1c goal: <7% Provided education to patient  about basic DM disease process; Reviewed medications with patient and discussed importance of medication adherence;        Reviewed prescribed diet with patient advised patient to increase vegetables and fruit in her  diet; Discussed plans with patient for ongoing care management follow up and provided patient with direct contact information for care management team;      Review of patient status, including review of consultants reports, relevant laboratory and other test results, and medications completed;       Assessed social determinant of health barriers;        Collaborate with PCP for referral to nutrition  Pain:  (Status: New goal.) Long Term Goal  Pain assessment performed Medications reviewed Reviewed provider established plan for pain management; Discussed importance of adherence to all scheduled medical appointments; Advised patient to report to care team affect of pain on daily activities; Discussed use of relaxation techniques and/or diversional activities to assist with pain reduction (distraction, imagery, relaxation, massage, acupressure, TENS, heat, and cold application; Advised patient to discuss PT referral to improve balance with provider; Assessed social determinant of health barriers;  Referral to BSW for assistance with office copays, scheduled telephone appointment on 11/20/22 Advised patient to follow up with referral to Pain Management and arrange transportation with [facility] medical transportation  Patient Goals/Self-Care Activities: Take medications as prescribed   Attend all scheduled provider appointments Call provider office for new concerns or questions  Work with the social worker to address care coordination needs and will continue to work with the clinical team to address health care and disease management related needs take the blood sugar meter to all doctor visits drink 6 to 8 glasses of water each day fill half of plate with vegetables

## 2022-11-18 ENCOUNTER — Other Ambulatory Visit: Payer: Self-pay | Admitting: Internal Medicine

## 2022-11-18 DIAGNOSIS — E1169 Type 2 diabetes mellitus with other specified complication: Secondary | ICD-10-CM

## 2022-11-18 DIAGNOSIS — E669 Obesity, unspecified: Secondary | ICD-10-CM

## 2022-11-20 ENCOUNTER — Other Ambulatory Visit: Payer: Medicaid Other

## 2022-11-20 NOTE — Patient Instructions (Signed)
Visit Information  Ms. Blauvelt was given information about Medicaid Managed Care team care coordination services as a part of their [place] Medicaid benefit. [place] verbally consented to engagement with the [facility] Managed Care team.   If you are experiencing a medical emergency, please call 911 or report to your local emergency department or urgent care.   If you have a non-emergency medical problem during routine business hours, please contact your provider's office and ask to speak with a nurse.   For questions related to your [facility], please call: [phone] or visit the homepage here: [email or web address]  If you would like to schedule transportation through your [facility], please call the following number at least 2 days in advance of your appointment: [phone]   Rides for urgent appointments can also be made after hours by calling Member Services.  Call the [place] at [phone], at any time, 24 hours a day, 7 days a week. If you are in danger or need immediate medical attention call 911.  If you would like help to quit smoking, call 1-800-QUIT-NOW 817-638-2167) OR Espaol: 1-855-Djelo-Ya (3-762-831-5176) o para ms informacin haga clic aqu or Text READY to 200-400 to register via text  Ms. Rochette - following are the goals we discussed in your visit today:   Goals Addressed   None      Social Worker will follow up 12/10/22.   Mickel Fuchs, BSW, [place]  High Risk Managed Medicaid Team  [phone]   Following is a copy of your plan of care:  Care Plan : [place] of Care  Updates made by Ethelda Chick since 11/20/2022 12:00 AM     Problem: Health Management needs related to DM and Pain      Long-Range Goal: Development of Plan of Care to address  Health Management needs related to DM and Pain   Start Date: 11/15/2022  Expected End Date: 02/13/2023  Note:   Current Barriers:  Chronic Disease Management support and education needs related to DMII and Pain Financial Constraints. Patient needs help affording copays for PT  RNCM Clinical Goal(s):  Patient will verbalize understanding of plan for management of DMII and Pain as evidenced by Patient reports take all medications exactly as prescribed and will call provider for medication related questions as evidenced by Patient reports and EMR documentation    attend all scheduled medical appointments: 11/21/22 with PCP and 12/02/22 with Orthopedic to review MRI results as evidenced by Provider documentation        continue to work with [place] and/or Social Worker to address care management and care coordination needs related to DMII and Pain as evidenced by adherence to CM Team Scheduled appointments     through collaboration with RN Care manager, provider, and care team.   Interventions: Inter-disciplinary care team collaboration (see longitudinal plan of care) Evaluation of current treatment plan related to  self management and patient's adherence to plan as established by provider BSW completed a telephone outreach with patient. She stated that she is in need of assistance with her PT copays. BSW informed patient there are no assistance programs for individuals with Medicaid. BSW asked patient if she needed resources for food rent or utilities to maybe free up some of the funds being used there to go towards her copays. Patient stated she would like resources for rent. Her rent  is going up to 875 in March. BSW will mail patient resources for rent and utilites. No other resources are needed at this time.   Diabetes:  (Status: New goal.) Long Term Goal   Lab Results  Component Value Date   HGBA1C 6.3 05/20/2022   @ Assessed patient's understanding of A1c goal: <7% Provided education  to patient about basic DM disease process; Reviewed medications with patient and discussed importance of medication adherence;        Reviewed prescribed diet with patient advised patient to increase vegetables and fruit in her diet; Discussed plans with patient for ongoing care management follow up and provided patient with direct contact information for care management team;      Review of patient status, including review of consultants reports, relevant laboratory and other test results, and medications completed;       Assessed social determinant of health barriers;        Collaborate with PCP for referral to nutrition  Pain:  (Status: New goal.) Long Term Goal  Pain assessment performed Medications reviewed Reviewed provider established plan for pain management; Discussed importance of adherence to all scheduled medical appointments; Advised patient to report to care team affect of pain on daily activities; Discussed use of relaxation techniques and/or diversional activities to assist with pain reduction (distraction, imagery, relaxation, massage, acupressure, TENS, heat, and cold application; Advised patient to discuss PT referral to improve balance with provider; Assessed social determinant of health barriers;  Referral to BSW for assistance with office copays, scheduled telephone appointment on 11/20/22 Advised patient to follow up with referral to Pain Management and arrange transportation with [facility] medical transportation  Patient Goals/Self-Care Activities: Take medications as prescribed   Attend all scheduled provider appointments Call provider office for new concerns or questions  Work with the social worker to address care coordination needs and will continue to work with the clinical team to address health care and disease management related needs take the blood sugar meter to all doctor visits drink 6 to 8 glasses of water each day fill half of plate with vegetables

## 2022-11-20 NOTE — Patient Outreach (Signed)
Medicaid Managed Care Social Work Note  11/20/2022 Name:  [name] MRN:  [id number] DOB:  Jan 02, 1961  [name] is an 62 y.o. year old female who is a primary patient of Ladell Pier, MD.  The Medicaid Managed Care Coordination team was consulted for assistance with:  Community Resources   Ms. Clover was given information about Medicaid Managed Care Coordination team services today. [place] Patient agreed to services and verbal consent obtained.  Engaged with patient  for by telephone forinitial visit in response to referral for case management and/or care coordination services.   Assessments/Interventions:  Review of past medical history, allergies, medications, health status, including review of consultants reports, laboratory and other test data, was performed as part of comprehensive evaluation and provision of chronic care management services.  SDOH: (Social Determinant of Health) assessments and interventions performed: SDOH Interventions    Flowsheet Row Patient Outreach Telephone from 11/15/2022 in [place] Telephone from 10/03/2022 in [place] Interventions Intervention Not Indicated --  Housing Interventions Intervention Not Indicated --  Transportation Interventions Intervention Not Indicated Intervention Not Indicated  Utilities Interventions Intervention Not Indicated --     BSW completed a telephone outreach with patient. She stated that she is in need of assistance with her PT copays. BSW informed patient there are no assistance programs for individuals with Medicaid. BSW asked patient if she needed resources for food rent or utilities to maybe free up some of the funds being used there to go towards her copays. Patient stated she would like resources for rent. Her rent is going up to 875 in March. BSW will mail patient resources for rent and utilites.  No other resources are needed at this time  Advanced Directives Status:  Not addressed in this encounter.  Care Plan                 Allergies  Allergen Reactions   Iron Diarrhea   Metformin And Related Nausea And Vomiting   Penicillins Nausea And Vomiting    Has patient had a PCN reaction causing immediate rash, facial/tongue/throat swelling, SOB or lightheadedness with hypotension: No Has patient had a PCN reaction causing severe rash involving mucus membranes or skin necrosis: No Has patient had a PCN reaction that required hospitalization No Has patient had a PCN reaction occurring within the last 10 years: No If all of the above answers are "NO", then may proceed with Cephalosporin use.    Medications Reviewed Today     Reviewed by [name], RN (Registered Nurse) on 11/15/22 at 1253  Med List Status: <None>   Medication Order Taking? Sig Documenting Provider Last Dose Status Informant  Accu-Chek FastClix Lancets MISC [id number] Yes Use as instructed to check blood sugar three times daily Ladell Pier, MD Taking Active   acetaminophen (TYLENOL) 325 MG tablet [id number] Yes Take 2 tablets (650 mg total) by mouth every 6 (six) hours as needed for moderate pain. Alfonse Spruce, FNP Taking Active   amitriptyline (ELAVIL) 25 MG tablet [id number] Yes Take 1 tablet (25 mg total) by mouth at bedtime. Ladell Pier, MD Taking Active   amLODipine (NORVASC) 10 MG tablet [id number] Yes Take 1 tablet (10 mg total) by mouth daily. Ladell Pier, MD Taking Active   atorvastatin (LIPITOR) 80 MG tablet [id number] Yes Take 1 tablet (80 mg total) by mouth daily. Ladell Pier, MD  Taking Active   Blood Glucose Monitoring Suppl (ACCU-CHEK GUIDE ME) w/Device KIT [id number] Yes 1 kit by Does not apply route daily. Use as instructed to check blood sugar daily. E11.9 Ladell Pier, MD Taking Active   busPIRone (BUSPAR) 10 MG tablet [id number] Yes Take 10 mg by mouth 3 (three)  times daily. [provider] Taking Active   Dulaglutide (TRULICITY) 1.5 IP/3.8SN [place] [facility] [id number] Yes INJECT 1.'5MG'$  INTO THE SKIN Once weekly Ladell Pier, MD Taking Active   DULoxetine (CYMBALTA) 30 MG capsule [id number] Yes Take 1 tablet (30 mg) twice a day. Lorine Bears, NP Taking Active            Med Note (ROBB, MELANIE A   Fri Nov 15, 2022 12:50 PM) Instructed to take '60mg'$  once daily  glucose blood (ACCU-CHEK GUIDE) test strip [id number] Yes USE TO test THREE TIMES DAILY Ladell Pier, MD Taking Active   levothyroxine (SYNTHROID) 50 MCG tablet [id number] Yes TAKE ONE TABLET BY MOUTH EVERY MORNING Ladell Pier, MD Taking Active   lisinopril (ZESTRIL) 20 MG tablet [id number] Yes TAKE ONE TABLET BY MOUTH EVERY DAY Ladell Pier, MD Taking Active   omeprazole (PRILOSEC) 20 MG capsule [id number] Yes TAKE ONE CAPSULE BY MOUTH EVERY DAY Ladell Pier, MD Taking Active   propranolol (INDERAL) 80 MG tablet [id number] Yes TAKE ONE TABLET BY MOUTH TWICE DAILY Ladell Pier, MD Taking Active             Patient Active Problem List   Diagnosis Date Noted   Complete tear of right rotator cuff 05/16/2022   Adhesive capsulitis of right shoulder 03/14/2022   Influenza vaccine needed 10/31/2020   Need for diphtheria-tetanus-pertussis (Tdap) vaccine 10/31/2020   Hyperlipidemia associated with type 2 diabetes mellitus ([place]) 10/31/2020   Overweight (BMI 25.0-29.9) 10/31/2020   Type 2 diabetes mellitus without complication, without long-term current use of insulin ([place]) 07/31/2020   OSA (obstructive sleep apnea) 02/02/2020   Spondylosis without myelopathy or radiculopathy, lumbar region 09/17/2017   Essential hypertension 09/05/2017   Chronic bilateral low back pain with left-sided sciatica 09/05/2017   HTN (hypertension) 05/05/2017   Hypothyroidism 02/24/2017   Mixed hyperlipidemia 02/24/2017   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   Sleep related headaches  01/23/2017   Snoring 01/23/2017   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   Chronic headaches 10/30/2016   Obesity (BMI 30.0-34.9) 05/24/2016   PMB (postmenopausal bleeding) 05/24/2016   Back pain of lumbar region with sciatica 09/28/2015    Conditions to be addressed/monitored per PCP order:   community resources  Care Plan : [place] of Care  Updates made by Ethelda Chick since 11/20/2022 12:00 AM     Problem: Health Management needs related to DM and Pain      Long-Range Goal: Development of Plan of Care to address Health Management needs related to DM and Pain   Start Date: 11/15/2022  Expected End Date: 02/13/2023  Note:   Current Barriers:  Chronic Disease Management support and education needs related to DMII and Pain Financial Constraints. Patient needs help affording copays for PT  RNCM Clinical Goal(s):  Patient will verbalize understanding of plan for management of DMII and Pain as evidenced by Patient reports take all medications exactly as prescribed and will call provider for medication related questions as evidenced by Patient reports and EMR documentation    attend all scheduled medical appointments: 11/21/22 with PCP and 12/02/22 with Orthopedic to review MRI  results as evidenced by Provider documentation        continue to work with RN Care Manager and/or Social Worker to address care management and care coordination needs related to DMII and Pain as evidenced by adherence to CM Team Scheduled appointments     through collaboration with RN Care manager, provider, and care team.   Interventions: Inter-disciplinary care team collaboration (see longitudinal plan of care) Evaluation of current treatment plan related to  self management and patient's adherence to plan as established by provider BSW completed a telephone outreach with patient. She stated that she is in need of assistance with her PT copays. BSW informed patient there are no assistance  programs for individuals with Medicaid. BSW asked patient if she needed resources for food rent or utilities to maybe free up some of the funds being used there to go towards her copays. Patient stated she would like resources for rent. Her rent is going up to 875 in March. BSW will mail patient resources for rent and utilites. No other resources are needed at this time.   Diabetes:  (Status: New goal.) Long Term Goal   Lab Results  Component Value Date   HGBA1C 6.3 05/20/2022   @ Assessed patient's understanding of A1c goal: <7% Provided education to patient about basic DM disease process; Reviewed medications with patient and discussed importance of medication adherence;        Reviewed prescribed diet with patient advised patient to increase vegetables and fruit in her diet; Discussed plans with patient for ongoing care management follow up and provided patient with direct contact information for care management team;      Review of patient status, including review of consultants reports, relevant laboratory and other test results, and medications completed;       Assessed social determinant of health barriers;        Collaborate with PCP for referral to nutrition  Pain:  (Status: New goal.) Long Term Goal  Pain assessment performed Medications reviewed Reviewed provider established plan for pain management; Discussed importance of adherence to all scheduled medical appointments; Advised patient to report to care team affect of pain on daily activities; Discussed use of relaxation techniques and/or diversional activities to assist with pain reduction (distraction, imagery, relaxation, massage, acupressure, TENS, heat, and cold application; Advised patient to discuss PT referral to improve balance with provider; Assessed social determinant of health barriers;  Referral to BSW for assistance with office copays, scheduled telephone appointment on 11/20/22 Advised patient to follow up with  referral to Pain Management and arrange transportation with [facility] medical transportation  Patient Goals/Self-Care Activities: Take medications as prescribed   Attend all scheduled provider appointments Call provider office for new concerns or questions  Work with the social worker to address care coordination needs and will continue to work with the clinical team to address health care and disease management related needs take the blood sugar meter to all doctor visits drink 6 to 8 glasses of water each day fill half of plate with vegetables       Follow up:  Patient agrees to Care Plan and Follow-up.  Plan: The Managed Medicaid care management team will reach out to the patient again over the next 10-14 days.  Date/time of next scheduled Social Work care management/care coordination outreach:  12/10/22  Mickel Fuchs, Arita Miss, [place] Medicaid Team  [phone]

## 2022-11-21 ENCOUNTER — Ambulatory Visit: Payer: Medicaid Other | Admitting: Internal Medicine

## 2022-12-02 ENCOUNTER — Ambulatory Visit (INDEPENDENT_AMBULATORY_CARE_PROVIDER_SITE_OTHER): Payer: Medicaid Other | Admitting: Surgical

## 2022-12-02 ENCOUNTER — Encounter: Payer: Self-pay | Admitting: Surgical

## 2022-12-02 ENCOUNTER — Ambulatory Visit (INDEPENDENT_AMBULATORY_CARE_PROVIDER_SITE_OTHER): Payer: Medicaid Other

## 2022-12-02 DIAGNOSIS — M5412 Radiculopathy, cervical region: Secondary | ICD-10-CM

## 2022-12-02 DIAGNOSIS — M5416 Radiculopathy, lumbar region: Secondary | ICD-10-CM | POA: Diagnosis not present

## 2022-12-02 NOTE — Progress Notes (Signed)
Office Visit Note   Patient: [name]           Date of Birth: 1961-10-03           MRN: [id number] Visit Date: 12/02/2022 Requested by: Ladell Pier, MD [place],  [place] [id number] PCP: Ladell Pier, MD  Subjective: Chief Complaint  Patient presents with   Other    Scan review    HPI: [name] is a 62 y.o. female who presents to the office for MRI review.  Or today for MRI review of the left shoulder.  She complains of left shoulder pain with radiation down into the left hand.  She has associated scapular pain and numbness and tingling in her fingertips.  She has a throbbing sensation of pain.  She is able to lay on the left shoulder at night.  Her right shoulder is bothering her again as well.  She has history of right shoulder adhesive capsulitis which she has had right shoulder injection for her.  Most of her shoulder pain in the right arm is around the lateral aspect of the shoulder to the mid humeral region.  Last injection lasted for about 2 to 3 weeks.  She has been doing home exercise program focusing on shoulder range of motion exercises for about 2 to 3 days out of the week.  Does have cystic neck pain on the left-hand side as well as some trapezius pain.               ROS: All systems reviewed are negative as they relate to the chief complaint within the history of present illness.  Patient denies fevers or chills.  Assessment & Plan: Visit Diagnoses:  1. Cervical radiculopathy     Plan: [name] is a 62 y.o. female who presents to the office for review of left shoulder MRI scan.  MRI demonstrates split tear of the long head of the bicep tendon but no rotator cuff tear.  Patient does have weakness of external rotation and bicep flexion of the left arm which is suspicious along with her cervical pathology noted on x-rays for cervical radiculopathy.  She has radicular pain down the entirety of the left arm now which is new  compared with her last visit where she really just primarily had left shoulder pain.  With no significant rotator cuff pathology noted on MRI scan but persisting rotator cuff weakness, plan to order MRI cervical spine for evaluation of cervical radiculopathy as well as the source of her bicep flexion and infraspinatus weakness.  Follow-Up Instructions: No follow-ups on file.   Orders:  Orders Placed This Encounter  Procedures   XR Cervical Spine 2 or 3 views   MR Cervical Spine w/o contrast   No orders of the defined types were placed in this encounter.     Procedures: No procedures performed   Clinical Data: No additional findings.  Objective: Vital Signs: LMP 06/06/2015   Physical Exam:  Constitutional: Patient appears well-developed HEENT:  Head: Normocephalic Eyes:EOM are normal Neck: Normal range of motion Cardiovascular: Normal rate Pulmonary/chest: Effort normal Neurologic: Patient is alert Skin: Skin is warm Psychiatric: Patient has normal mood and affect  Ortho Exam: Ortho exam demonstrates left shoulder with 40 degrees X rotation, 110 degrees abduction, 165 degrees forward elevation.  This compared with the right shoulder 35 degrees external rotation, 85 degrees abduction, 150 degrees forward elevation.  She has excellent strength of bilateral  grip strength, finger abduction, pronation/supination, tricep, deltoid.  Axillary nerve is intact with deltoid firing bilaterally.  She has 4/5 bicep flexion strength of the left arm compared with 5/5 bicep flexion strength of the right arm.  She also has 5/5 motor strength of bilateral supraspinatus and subscapularis but only 4/5 infraspinatus strength of the left arm versus 5/5 infraspinatus strength of right arm.  Negative O'Brien sign.  No tenderness over the [facility] joint of the left shoulder.    Specialty Comments:  EXAM: MRI LUMBAR SPINE WITHOUT CONTRAST   TECHNIQUE: Multiplanar, multisequence MR imaging of the lumbar  spine was performed. No intravenous contrast was administered.   COMPARISON:  05/16/2017   FINDINGS: Segmentation:  Standard.   Alignment:  Physiologic.   Vertebrae:  No fracture, evidence of discitis, or bone lesion.   Conus medullaris and cauda equina: Conus extends to the L1 level. Conus and cauda equina appear normal.   Paraspinal and other soft tissues: Negative for perispinal mass or inflammation   Disc levels:   T12- L1: Unremarkable.   L1-L2: Unremarkable.   L2-L3: Small left inferior foraminal protrusion. No change or neural impingement   L3-L4: Small left inferior foraminal protrusion. Minor disc bulging. No change or neural impingement.   L4-L5: Minor annulus bulging   L5-S1:Minor annulus bulging   IMPRESSION: Stable mild degenerative changes when compared to 2018. No neural impingement     Electronically Signed   By: Jorje Guild M.D.   On: 03/31/2022 04:09  Imaging: No results found.   PMFS History: Patient Active Problem List   Diagnosis Date Noted   Complete tear of right rotator cuff 05/16/2022   Adhesive capsulitis of right shoulder 03/14/2022   Influenza vaccine needed 10/31/2020   Need for diphtheria-tetanus-pertussis (Tdap) vaccine 10/31/2020   Hyperlipidemia associated with type 2 diabetes mellitus ([place]) 10/31/2020   Overweight (BMI 25.0-29.9) 10/31/2020   Type 2 diabetes mellitus without complication, without long-term current use of insulin ([place]) 07/31/2020   OSA (obstructive sleep apnea) 02/02/2020   Spondylosis without myelopathy or radiculopathy, lumbar region 09/17/2017   Essential hypertension 09/05/2017   Chronic bilateral low back pain with left-sided sciatica 09/05/2017   HTN (hypertension) 05/05/2017   Hypothyroidism 02/24/2017   Mixed hyperlipidemia 02/24/2017   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   Sleep related headaches 01/23/2017   Snoring 01/23/2017   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   Chronic  headaches 10/30/2016   Obesity (BMI 30.0-34.9) 05/24/2016   PMB (postmenopausal bleeding) 05/24/2016   Back pain of lumbar region with sciatica 09/28/2015   Past Medical History:  Diagnosis Date   Anxiety    Arthritis    Depression    GERD (gastroesophageal reflux disease)    Hyperlipidemia    Hypertension    Migraines    Thyroid disease     Family History  Problem Relation Age of Onset   Headache Neg Hx    Colon cancer Neg Hx    Esophageal cancer Neg Hx    Rectal cancer Neg Hx    Stomach cancer Neg Hx    Breast cancer Neg Hx     Past Surgical History:  Procedure Laterality Date   BREAST CYST EXCISION Right 1980   BREAST CYST EXCISION Left 1985   NO PAST SURGERIES     Social History   Occupational History   Occupation: Unemployed  Tobacco Use   Smoking status: Never   Smokeless tobacco: Never  Substance and Sexual Activity   Alcohol use: No  Alcohol/week: 0.0 standard drinks of alcohol   Drug use: No   Sexual activity: Not Currently    Partners: Male

## 2022-12-10 ENCOUNTER — Other Ambulatory Visit: Payer: Medicaid Other

## 2022-12-10 NOTE — Patient Instructions (Addendum)
  Medicaid Managed Care   Unsuccessful Outreach Note  12/10/2022 Name: [name] MRN: [id number] DOB: 1961/06/30  Referred by: Ladell Pier, MD Reason for referral : High Risk Managed Medicaid (MM social work telephone outreach )   An unsuccessful telephone outreach was attempted today. The patient was referred to the case management team for assistance with care management and care coordination.   Follow Up Plan: A HIPAA compliant phone message was left for the patient providing contact information and requesting a return call.   Mickel Fuchs, BSW, [place] Managed Medicaid Team  [phone] Visit Information  [name] was given information about Medicaid Managed Care team care coordination services as a part of their [place] Medicaid benefit. [name] verbally consented to engagement with the [facility] Managed Care team.   If you are experiencing a medical emergency, please call 911 or report to your local emergency department or urgent care.   If you have a non-emergency medical problem during routine business hours, please contact your provider's office and ask to speak with a nurse.   For questions related to your [facility], please call: [phone] or visit the homepage here: [email or web address]  If you would like to schedule transportation through your [facility], please call the following number at least 2 days in advance of your appointment: [phone]   Rides for urgent appointments can also be made after hours by calling Member Services.  Call the [place] at [phone], at any time, 24 hours a day, 7 days a week. If you are in danger or need immediate medical attention call 911.  If you would like help to quit smoking, call 1-800-QUIT-NOW  (514)492-0307) OR Espaol: 1-855-Djelo-Ya HD:1601594) o para ms informacin haga clic aqu or Text READY to 200-400 to register via text  [name] - following are the goals we discussed in your visit today:   Goals Addressed   None      Social Worker will follow up in 60 days.   Mickel Fuchs, BSW, [place] Managed Medicaid Team  [phone]   Following is a copy of your plan of care:  There are no care plans that you recently modified to display for this patient.

## 2022-12-10 NOTE — Patient Outreach (Signed)
Medicaid Managed Care Social Work Note  12/10/2022 Name:  [name] MRN:  [id number] DOB:  [birth year]  [name] is an 62 y.o. year old female who is a primary patient of Ladell Pier, MD.  The Medicaid Managed Care Coordination team was consulted for assistance with:  Community Resources   [name] was given information about Medicaid Managed Care Coordination team services today. [name] agreed to services and verbal consent obtained.  Engaged with patient  for by telephone forfollow up visit in response to referral for case management and/or care coordination services.   Assessments/Interventions:  Review of past medical history, allergies, medications, health status, including review of consultants reports, laboratory and other test data, was performed as part of comprehensive evaluation and provision of chronic care management services.  SDOH: (Social Determinant of Health) assessments and interventions performed: SDOH Interventions    Flowsheet Row Patient Outreach Telephone from 11/15/2022 in [place] Telephone from 10/03/2022 in [place] Interventions Intervention Not Indicated --  Housing Interventions Intervention Not Indicated --  Transportation Interventions Intervention Not Indicated Intervention Not Indicated  Utilities Interventions Intervention Not Indicated --      BSW completed a telephone outreach with patient. She stated she did not receive the list of resources BSW sent to her. BSW and patient agreed for resources to be resent via Mychart. Patient states no other resources are needed at this time. Advanced Directives Status:  Not addressed in this encounter.  Care Plan                 Allergies  Allergen Reactions   Iron Diarrhea   Metformin And Related Nausea And Vomiting   Penicillins Nausea And Vomiting    Has patient had a  PCN reaction causing immediate rash, facial/tongue/throat swelling, SOB or lightheadedness with hypotension: No Has patient had a PCN reaction causing severe rash involving mucus membranes or skin necrosis: No Has patient had a PCN reaction that required hospitalization No Has patient had a PCN reaction occurring within the last 10 years: No If all of the above answers are "NO", then may proceed with Cephalosporin use.    Medications Reviewed Today     Reviewed by Rosalin Hawking (Physician Assistant Certified) on [id number] at [place] List Status: <None>   Medication Order Taking? Sig Documenting Provider Last Dose Status Informant  Accu-Chek FastClix Lancets MISC [id number] No Use as instructed to check blood sugar three times daily Ladell Pier, MD Taking Active   acetaminophen (TYLENOL) 325 MG tablet [id number] No Take 2 tablets (650 mg total) by mouth every 6 (six) hours as needed for moderate pain. Alfonse Spruce, FNP Taking Active   amitriptyline (ELAVIL) 25 MG tablet [id number] No Take 1 tablet (25 mg total) by mouth at bedtime. Ladell Pier, MD Taking Active   amLODipine (NORVASC) 10 MG tablet [id number] No Take 1 tablet (10 mg total) by mouth daily. Ladell Pier, MD Taking Active   atorvastatin (LIPITOR) 80 MG tablet [id number] No Take 1 tablet (80 mg total) by mouth daily. Ladell Pier, MD Taking Active   Blood Glucose Monitoring Suppl (ACCU-CHEK GUIDE ME) w/Device KIT [id number] No 1 kit by Does not apply route daily. Use as instructed to check blood sugar daily. E11.9 Ladell Pier, MD Taking Active   busPIRone (BUSPAR) 10 MG tablet [id number] No Take 10 mg  by mouth 3 (three) times daily. [provider] Taking Active   Dulaglutide (TRULICITY) 1.5 0000000 SOPN [id number] No INJECT 1.5MG INTO THE SKIN Once weekly Ladell Pier, MD Taking Active   DULoxetine (CYMBALTA) 30 MG capsule [id number] No Take 1 tablet (30 mg) twice a day.  Lorine Bears, NP Taking Active            Med Note (ROBB, MELANIE A   Fri Nov 15, 2022 12:50 PM) Instructed to take 61m once daily  glucose blood (ACCU-CHEK GUIDE) test strip [id number] USE TO test THREE TIMES DAILY JLadell Pier MD Taking Active   levothyroxine (SYNTHROID) 50 MCG tablet [id number] TAKE ONE TABLET BY MOUTH EVERY MORNING JLadell Pier MD Taking Active   lisinopril (ZESTRIL) 20 MG tablet [id number] TAKE ONE TABLET BY MOUTH EVERY DAY JLadell Pier MD Taking Active   omeprazole (PRILOSEC) 20 MG capsule [id number] TAKE ONE CAPSULE BY MOUTH EVERY DAY JLadell Pier MD Taking Active   propranolol (INDERAL) 80 MG tablet [id number] TAKE ONE TABLET BY MOUTH TWICE DAILY JLadell Pier MD Taking Active             Patient Active Problem List   Diagnosis Date Noted   Complete tear of right rotator cuff 05/16/2022   Adhesive capsulitis of right shoulder 03/14/2022   Influenza vaccine needed 10/31/2020   Need for diphtheria-tetanus-pertussis (Tdap) vaccine 10/31/2020   Hyperlipidemia associated with type 2 diabetes mellitus ([place] 10/31/2020   Overweight (BMI 25.0-29.9) 10/31/2020   Type 2 diabetes mellitus without complication, without long-term current use of insulin ([place] 07/31/2020   OSA (obstructive sleep apnea) 02/02/2020   Spondylosis without myelopathy or radiculopathy, lumbar region 09/17/2017   Essential hypertension 09/05/2017   Chronic bilateral low back pain with left-sided sciatica 09/05/2017   HTN (hypertension) 05/05/2017   Hypothyroidism 02/24/2017   Mixed hyperlipidemia 02/24/2017   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   Sleep related headaches 01/23/2017   Snoring 01/23/2017   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   Chronic headaches 10/30/2016   Obesity (BMI 30.0-34.9) 05/24/2016   PMB (postmenopausal bleeding) 05/24/2016   Back pain of lumbar region with sciatica 09/28/2015    Conditions to be  addressed/monitored per PCP order:   community resources  There are no care plans that you recently modified to display for this patient.   Follow up:  Patient agrees to Care Plan and Follow-up.  Plan: The Managed Medicaid care management team will reach out to the patient again over the next 60 days.  Date/time of next scheduled Social Work care management/care coordination outreach:  02/07/23  AMickel Fuchs BArita Miss MChristineMedicaid Team  [phone]

## 2022-12-10 NOTE — Patient Outreach (Signed)
  Medicaid Managed Care   Unsuccessful Outreach Note  12/10/2022 Name: [name] MRN: [id number] DOB: 07-31-1961  Referred by: Ladell Pier, MD Reason for referral : High Risk Managed Medicaid (MM social work telephone outreach )   An unsuccessful telephone outreach was attempted today. The patient was referred to the case management team for assistance with care management and care coordination.   Follow Up Plan: A HIPAA compliant phone message was left for the patient providing contact information and requesting a return call.   Mickel Fuchs, BSW, [place] Managed Medicaid Team  [phone]

## 2022-12-13 ENCOUNTER — Telehealth: Payer: Self-pay | Admitting: Orthopedic Surgery

## 2022-12-13 ENCOUNTER — Other Ambulatory Visit: Payer: Self-pay | Admitting: Family Medicine

## 2022-12-13 DIAGNOSIS — I10 Essential (primary) hypertension: Secondary | ICD-10-CM

## 2022-12-13 NOTE — Telephone Encounter (Signed)
Requested Prescriptions  Pending Prescriptions Disp Refills   lisinopril (ZESTRIL) 20 MG tablet [Pharmacy Med Name: lisinopril 20 mg tablet] 90 tablet 0    Sig: TAKE ONE TABLET BY MOUTH EVERY DAY     Cardiovascular:  ACE Inhibitors Failed - 12/13/2022 10:38 AM      Failed - Valid encounter within last 6 months    Recent Outpatient Visits           6 months ago Diabetes mellitus type 2 in obese [facility])   [place] Karle Plumber B, MD   11 months ago Diabetes mellitus type 2 in obese [facility])   [place] Karle Plumber B, MD   1 year ago Type 2 diabetes mellitus without complication, without long-term current use of insulin [facility])   [place] Karle Plumber B, MD   1 year ago Type 2 diabetes mellitus without complication, without long-term current use of insulin [facility])   [place] Ladell Pier, MD   1 year ago Gastroesophageal reflux disease without esophagitis   [place], MD       Future Appointments             In 4 days Ladell Pier, MD [place]   In 1 week Marlou Sa, [place], MD [place] in normal range and within 180 days    Creat  Date Value Ref Range Status  09/28/2015 0.77 0.50 - 1.05 mg/dL Final   Creatinine, Ser  Date Value Ref Range Status  09/30/2022 0.96 0.44 - 1.00 mg/dL Final         Passed - K in normal range and within 180 days    Potassium  Date Value Ref Range Status  09/30/2022 4.1 3.5 - 5.1 mmol/L Final         Passed - Patient is not pregnant      Passed - Last BP in normal range    BP Readings from Last 1 Encounters:  10/25/22 99/66

## 2022-12-13 NOTE — Telephone Encounter (Signed)
Called pt 1X and left vm for pt to call and set an MRI review appt with Dr Marlou Sa after 12/24/22

## 2022-12-17 ENCOUNTER — Encounter: Payer: Self-pay | Admitting: Surgical

## 2022-12-17 ENCOUNTER — Ambulatory Visit: Payer: Medicaid Other | Attending: Internal Medicine | Admitting: Internal Medicine

## 2022-12-17 VITALS — BP 109/80 | HR 79 | Temp 98.1°F | Ht 67.0 in | Wt 181.0 lb

## 2022-12-17 DIAGNOSIS — R519 Headache, unspecified: Secondary | ICD-10-CM

## 2022-12-17 DIAGNOSIS — E1159 Type 2 diabetes mellitus with other circulatory complications: Secondary | ICD-10-CM | POA: Diagnosis not present

## 2022-12-17 DIAGNOSIS — M5412 Radiculopathy, cervical region: Secondary | ICD-10-CM | POA: Diagnosis not present

## 2022-12-17 DIAGNOSIS — Z7985 Long-term (current) use of injectable non-insulin antidiabetic drugs: Secondary | ICD-10-CM

## 2022-12-17 DIAGNOSIS — Z1211 Encounter for screening for malignant neoplasm of colon: Secondary | ICD-10-CM

## 2022-12-17 DIAGNOSIS — G319 Degenerative disease of nervous system, unspecified: Secondary | ICD-10-CM | POA: Diagnosis not present

## 2022-12-17 DIAGNOSIS — E1169 Type 2 diabetes mellitus with other specified complication: Secondary | ICD-10-CM | POA: Diagnosis not present

## 2022-12-17 DIAGNOSIS — E663 Overweight: Secondary | ICD-10-CM

## 2022-12-17 DIAGNOSIS — E039 Hypothyroidism, unspecified: Secondary | ICD-10-CM | POA: Diagnosis not present

## 2022-12-17 DIAGNOSIS — Z6828 Body mass index (BMI) 28.0-28.9, adult: Secondary | ICD-10-CM

## 2022-12-17 DIAGNOSIS — R296 Repeated falls: Secondary | ICD-10-CM

## 2022-12-17 DIAGNOSIS — I152 Hypertension secondary to endocrine disorders: Secondary | ICD-10-CM | POA: Diagnosis not present

## 2022-12-17 DIAGNOSIS — E785 Hyperlipidemia, unspecified: Secondary | ICD-10-CM | POA: Diagnosis not present

## 2022-12-17 DIAGNOSIS — Z23 Encounter for immunization: Secondary | ICD-10-CM

## 2022-12-17 LAB — POCT GLYCOSYLATED HEMOGLOBIN (HGB A1C): HbA1c, POC (controlled diabetic range): 6.2 % (ref 0.0–7.0)

## 2022-12-17 LAB — GLUCOSE, POCT (MANUAL RESULT ENTRY): POC Glucose: 130 mg/dl — AB (ref 70–99)

## 2022-12-17 MED ORDER — PROPRANOLOL HCL 80 MG PO TABS: 80.0000 mg | ORAL_TABLET | Freq: Two times a day (BID) | ORAL | 0 refills | Status: AC

## 2022-12-17 MED ORDER — LISINOPRIL 20 MG PO TABS: 20.0000 mg | ORAL_TABLET | Freq: Every day | ORAL | 0 refills | Status: DC

## 2022-12-17 MED ORDER — AMLODIPINE BESYLATE 10 MG PO TABS: 10.0000 mg | ORAL_TABLET | Freq: Every day | ORAL | 0 refills | Status: DC

## 2022-12-17 MED ORDER — ATORVASTATIN CALCIUM 80 MG PO TABS: 80.0000 mg | ORAL_TABLET | Freq: Every day | ORAL | 0 refills | Status: DC

## 2022-12-17 NOTE — Progress Notes (Unsigned)
Patient ID: [name], female    DOB: December 13, 1960  MRN: [id number]  CC: Diabetes (DM f/u. Med refills/Yes to flu vax.)   Subjective: [name] is a 62 y.o. female who presents for chronic ds management Her concerns today include:  HTN, HL, depression, hypothyroid, DM, obesity, tension HA, chronic LBP (minimal degen disc ds on MRI).    DM:  Results for orders placed or performed in visit on 12/17/22  POCT glucose (manual entry)  Result Value Ref Range   POC Glucose 130 (A) 70 - 99 mg/dl  POCT glycosylated hemoglobin (Hb A1C)  Result Value Ref Range   Hemoglobin A1C     HbA1c POC (<> result, manual entry)     HbA1c, POC (prediabetic range)     HbA1c, POC (controlled diabetic range) 6.2 0.0 - 7.0 %  On Trulicity 1.5 mg/wk; dec appetite, down 12 lbs since last visit 04/2022  HTN: Taking and tolerating Norvasc 10 mg daily, lisinopril 20 mg daily and propranolol 80 mg twice a day.   Limits salt in foods No chest pains or shortness of breath. Reports chronic nocturnal headaches x 2 years.  Usually goes to bed with a headache but also can wake up during the night with headache. Endorses blurred visison with the HA, no dizziness, no photophobia, N/V.  Had CT brain 2017 which showed age advance atrophy with chronic microvascular ischemic changes.  CT scan head 09/2022 showed chronic vascular ischemic disease changes. Goes away if she takes Tylenol "soon enough."  Last about 1 hr after taking Tylenol; takes 2 Tylenols every night.    Reports several falls since last visit -fell down front step 12/203 - miss step and hit head against brick wall.  Fell in bathtub before christmas.  Took Lexapro to see if it would help with sleep. Use to take it a while back , made her feel woobly Feel in kitchen,  Cab driver placed some grocery bags in door way.  Tried to kick out of the way, shoe caught on step causing her to fall on her side  On Cymbalta by Ocie Bob with Dr. Luster Landsberg with ortho  care Started in August. HL: Lipitor 80 mg  Hypothyroid: taking Levothyroxine  Seeing ortho.  Will have MRI neck cervical spine for LT sided radiculopathy Throbbing in shoulder - given 2 inj RT better for 2 wks then wore off  Patient Active Problem List   Diagnosis Date Noted   Complete tear of right rotator cuff 05/16/2022   Adhesive capsulitis of right shoulder 03/14/2022   Influenza vaccine needed 10/31/2020   Need for diphtheria-tetanus-pertussis (Tdap) vaccine 10/31/2020   Hyperlipidemia associated with type 2 diabetes mellitus ([place]) 10/31/2020   Overweight (BMI 25.0-29.9) 10/31/2020   Type 2 diabetes mellitus without complication, without long-term current use of insulin ([place]) 07/31/2020   OSA (obstructive sleep apnea) 02/02/2020   Spondylosis without myelopathy or radiculopathy, lumbar region 09/17/2017   Essential hypertension 09/05/2017   Chronic bilateral low back pain with left-sided sciatica 09/05/2017   HTN (hypertension) 05/05/2017   Hypothyroidism 02/24/2017   Mixed hyperlipidemia 02/24/2017   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   Sleep related headaches 01/23/2017   Snoring 01/23/2017   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   Chronic headaches 10/30/2016   Obesity (BMI 30.0-34.9) 05/24/2016   PMB (postmenopausal bleeding) 05/24/2016   Back pain of lumbar region with sciatica 09/28/2015     Current Outpatient Medications on File Prior to Visit  Medication Sig Dispense  Refill   Accu-Chek FastClix Lancets MISC Use as instructed to check blood sugar three times daily 102 each 6   acetaminophen (TYLENOL) 325 MG tablet Take 2 tablets (650 mg total) by mouth every 6 (six) hours as needed for moderate pain. 30 tablet    amitriptyline (ELAVIL) 25 MG tablet Take 1 tablet (25 mg total) by mouth at bedtime. 30 tablet 0   amLODipine (NORVASC) 10 MG tablet Take 1 tablet (10 mg total) by mouth daily. 90 tablet 0   atorvastatin (LIPITOR) 80 MG tablet Take 1 tablet (80 mg  total) by mouth daily. 90 tablet 0   Blood Glucose Monitoring Suppl (ACCU-CHEK GUIDE ME) w/Device KIT 1 kit by Does not apply route daily. Use as instructed to check blood sugar daily. E11.9 1 kit 0   busPIRone (BUSPAR) 10 MG tablet Take 10 mg by mouth 3 (three) times daily.     Dulaglutide (TRULICITY) 1.5 0000000 SOPN INJECT 1.'5MG'$  INTO THE SKIN Once weekly 2 mL 6   DULoxetine (CYMBALTA) 30 MG capsule Take 1 tablet (30 mg) twice a day. 60 capsule 1   glucose blood (ACCU-CHEK GUIDE) test strip USE TO test THREE TIMES DAILY 100 strip 6   levothyroxine (SYNTHROID) 50 MCG tablet TAKE ONE TABLET BY MOUTH EVERY MORNING 90 tablet 1   lisinopril (ZESTRIL) 20 MG tablet TAKE ONE TABLET BY MOUTH EVERY DAY 90 tablet 0   omeprazole (PRILOSEC) 20 MG capsule TAKE ONE CAPSULE BY MOUTH EVERY DAY 90 capsule 0   propranolol (INDERAL) 80 MG tablet TAKE ONE TABLET BY MOUTH TWICE DAILY 180 tablet 0   No current facility-administered medications on file prior to visit.    Allergies  Allergen Reactions   Iron Diarrhea   Metformin And Related Nausea And Vomiting   Penicillins Nausea And Vomiting    Has patient had a PCN reaction causing immediate rash, facial/tongue/throat swelling, SOB or lightheadedness with hypotension: No Has patient had a PCN reaction causing severe rash involving mucus membranes or skin necrosis: No Has patient had a PCN reaction that required hospitalization No Has patient had a PCN reaction occurring within the last 10 years: No If all of the above answers are "NO", then may proceed with Cephalosporin use.    Social History   Socioeconomic History   Marital status: Single    Spouse name: Not on file   Number of children: 1   Years of education: 12   Highest education level: Not on file  Occupational History   Occupation: Unemployed  Tobacco Use   Smoking status: Never   Smokeless tobacco: Never  Substance and Sexual Activity   Alcohol use: No    Alcohol/week: 0.0 standard  drinks of alcohol   Drug use: No   Sexual activity: Not Currently    Partners: Male  Other Topics Concern   Not on file  Social History Narrative   Lives with son, Shanon Brow.   Caffeine use: Daily       Left handed   Social Determinants of Health   Financial Resource Strain: Not on file  Food Insecurity: Food Insecurity Present (11/15/2022)   Hunger Vital Sign    Worried About Running Out of Food in the Last Year: Sometimes true    Ran Out of Food in the Last Year: Sometimes true  Transportation Needs: No Transportation Needs (11/15/2022)   PRAPARE - Hydrologist (Medical): No    Lack of Transportation (Non-Medical): No  Physical Activity: Not  on file  Stress: Not on file  Social Connections: Not on file  Intimate Partner Violence: Not on file    Family History  Problem Relation Age of Onset   Headache Neg Hx    Colon cancer Neg Hx    Esophageal cancer Neg Hx    Rectal cancer Neg Hx    Stomach cancer Neg Hx    Breast cancer Neg Hx     Past Surgical History:  Procedure Laterality Date   BREAST CYST EXCISION Right 1980   BREAST CYST EXCISION Left 1985   NO PAST SURGERIES      ROS: Review of Systems Negative except as stated above  PHYSICAL EXAM: BP 109/80 (BP Location: Left Arm, Patient Position: Sitting, Cuff Size: Normal)   Pulse 79   Temp 98.1 F (36.7 C) (Oral)   Ht '5\' 7"'$  (1.702 m)   Wt 181 lb (82.1 kg)   LMP 06/06/2015   SpO2 96%   BMI 28.35 kg/m   Wt Readings from Last 3 Encounters:  12/17/22 181 lb (82.1 kg)  05/20/22 193 lb (87.5 kg)  12/27/21 195 lb 12.8 oz (88.8 kg)    Physical Exam  {female adult master:310786} {female adult master:310785}     Latest Ref Rng & Units 09/30/2022    2:55 PM 05/20/2022   11:48 AM 08/28/2021    9:31 AM  CMP  Glucose 70 - 99 mg/dL 129  100  97   BUN 8 - 23 mg/dL '14  13  17   '$ Creatinine 0.44 - 1.00 mg/dL 0.96  0.83  1.08   Sodium 135 - 145 mmol/L 136  138  137   Potassium 3.5 - 5.1  mmol/L 4.1  4.6  4.7   Chloride 98 - 111 mmol/L 103  101  100   CO2 22 - 32 mmol/L '23  22  26   '$ Calcium 8.9 - 10.3 mg/dL 9.3  9.7  10.0   Total Protein 6.5 - 8.1 g/dL 8.2  8.6  8.9   Total Bilirubin 0.3 - 1.2 mg/dL 0.9  0.4  0.4   Alkaline Phos 38 - 126 U/L 82  131  105   AST 15 - 41 U/L '22  14  18   '$ ALT 0 - 44 U/L 29  18  33    Lipid Panel     Component Value Date/Time   CHOL 150 05/20/2022 1148   TRIG 139 05/20/2022 1148   HDL 34 (L) 05/20/2022 1148   CHOLHDL 4.4 05/20/2022 1148   CHOLHDL 9.2 (H) 09/28/2015 1420   VLDL 61 (H) 09/28/2015 1420   LDLCALC 91 05/20/2022 1148    CBC    Component Value Date/Time   WBC 9.4 09/30/2022 1455   RBC 4.73 09/30/2022 1455   HGB 15.0 09/30/2022 1455   HGB 15.7 08/28/2021 0931   HCT 45.0 09/30/2022 1455   HCT 44.8 08/28/2021 0931   PLT 361 09/30/2022 1455   PLT 302 08/28/2021 0931   MCV 95.1 09/30/2022 1455   MCV 91 08/28/2021 0931   MCH 31.7 09/30/2022 1455   MCHC 33.3 09/30/2022 1455   RDW 13.0 09/30/2022 1455   RDW 12.3 08/28/2021 0931   LYMPHSABS 2.4 09/30/2022 1455   LYMPHSABS 2.4 02/17/2017 1141   MONOABS 0.6 09/30/2022 1455   EOSABS 0.1 09/30/2022 1455   EOSABS 0.1 02/17/2017 1141   BASOSABS 0.1 09/30/2022 1455   BASOSABS 0.1 02/17/2017 1141    ASSESSMENT AND PLAN:  1. Diabetes mellitus type  2 in obese (HCC) *** - POCT glucose (manual entry) - POCT glycosylated hemoglobin (Hb A1C)    Patient was given the opportunity to ask questions.  Patient verbalized understanding of the plan and was able to repeat key elements of the plan.   This documentation was completed using Radio producer.  Any transcriptional errors are unintentional.  Orders Placed This Encounter  Procedures   POCT glucose (manual entry)   POCT glycosylated hemoglobin (Hb A1C)     Requested Prescriptions    No prescriptions requested or ordered in this encounter    No follow-ups on file.  Karle Plumber, MD, FACP

## 2022-12-18 ENCOUNTER — Encounter: Payer: Self-pay | Admitting: *Deleted

## 2022-12-18 ENCOUNTER — Other Ambulatory Visit: Payer: Medicaid Other | Admitting: *Deleted

## 2022-12-18 ENCOUNTER — Encounter: Payer: Self-pay | Admitting: Internal Medicine

## 2022-12-18 DIAGNOSIS — G319 Degenerative disease of nervous system, unspecified: Secondary | ICD-10-CM | POA: Insufficient documentation

## 2022-12-18 LAB — MICROALBUMIN / CREATININE URINE RATIO
Creatinine, Urine: 466 mg/dL
Microalb/Creat Ratio: 9 mg/g creat (ref 0–29)
Microalbumin, Urine: 41.6 ug/mL

## 2022-12-18 LAB — TSH: TSH: 1.34 u[IU]/mL (ref 0.450–4.500)

## 2022-12-18 MED ORDER — AMLODIPINE BESYLATE 10 MG PO TABS: 10.0000 mg | ORAL_TABLET | Freq: Every day | ORAL | 1 refills | Status: DC

## 2022-12-18 MED ORDER — LEVOTHYROXINE SODIUM 50 MCG PO TABS: 50.0000 ug | ORAL_TABLET | Freq: Every morning | ORAL | 1 refills | Status: DC

## 2022-12-18 MED ORDER — ATORVASTATIN CALCIUM 80 MG PO TABS: 80.0000 mg | ORAL_TABLET | Freq: Every day | ORAL | 1 refills | Status: DC

## 2022-12-18 MED ORDER — DULOXETINE HCL 30 MG PO CPEP: 30.0000 mg | ORAL_CAPSULE | Freq: Every day | ORAL | 1 refills | Status: DC

## 2022-12-18 NOTE — Addendum Note (Signed)
Addended by: Karle Plumber B on: 12/18/2022 11:46 AM   Modules accepted: Orders

## 2022-12-18 NOTE — Patient Instructions (Signed)
Visit Information  [name] was given information about Medicaid Managed Care team care coordination services as a part of their [place] Medicaid benefit. [name] verbally consented to engagement with the [facility] Managed Care team.   If you are experiencing a medical emergency, please call 911 or report to your local emergency department or urgent care.   If you have a non-emergency medical problem during routine business hours, please contact your provider's office and ask to speak with a nurse.   For questions related to your [facility], please call: [phone] or visit the homepage here: [email or web address]  If you would like to schedule transportation through your [facility], please call the following number at least 2 days in advance of your appointment: [phone]   Rides for urgent appointments can also be made after hours by calling Member Services.  Call the [facility] at [phone], at any time, 24 hours a day, 7 days a week. If you are in danger or need immediate medical attention call 911.  If you would like help to quit smoking, call 1-800-QUIT-NOW 867-666-6063) OR Espaol: 1-855-Djelo-Ya QO:409462) o para ms informacin haga clic aqu or Text READY to 200-400 to register via text  [name],   Please see education materials related to managing pain provided by MyChart link.  Patient verbalizes understanding of instructions and care plan provided today and agrees to view in [name]. Active MyChart status and patient understanding of how to access instructions and care plan via MyChart confirmed with patient.     Telephone follow up appointment with Managed Medicaid care management team member scheduled for:01/17/23 @ 10:30am  [name] RN, BSN [place] RN Care  Coordinator   Following is a copy of your plan of care:  Care Plan : RN Care Manager Plan of Care  Updates made by Patricia Montane, RN since 12/18/2022 12:00 AM     Problem: Health Management needs related to DM and Pain      Long-Range Goal: Development of Plan of Care to address Health Management needs related to DM and Pain   Start Date: 11/15/2022  Expected End Date: 02/13/2023  Note:   Current Barriers:  Chronic Disease Management support and education needs related to DMII and Pain Financial Constraints. Patient needs help affording copays for PT  RNCM Clinical Goal(s):  Patient will verbalize understanding of plan for management of DMII and Pain as evidenced by Patient reports take all medications exactly as prescribed and will call provider for medication related questions as evidenced by Patient reports and EMR documentation    attend all scheduled medical appointments: 12/23/22 for Diabetes Nutrition, 12/24/22 for MRI, 12/26/22 with Orthopedic and 02/07/23 with BSW as evidenced by Provider documentation        continue to work with [place] and/or Social Worker to address care management and care coordination needs related to DMII and Pain as evidenced by adherence to CM Team Scheduled appointments     through collaboration with Consulting civil engineer, provider, and care team.   Interventions: Inter-disciplinary care team collaboration (see longitudinal plan of care) Evaluation of current treatment plan related to  self management and patient's adherence to plan as established by provider   Diabetes:  (Status: Goal on Track (progressing): YES.) Long Term Goal   Lab Results  Component Value Date   HGBA1C 6.2 12/17/2022   @ Assessed patient's understanding of  A1c goal: <7% Provided education to patient about basic DM disease process; Reviewed medications with patient and discussed importance of medication adherence;        Reviewed prescribed diet with patient advised patient to  increase vegetables and fruit in her diet; Discussed plans with patient for ongoing care management follow up and provided patient with direct contact information for care management team;      Review of patient status, including review of consultants reports, relevant laboratory and other test results, and medications completed;       Assessed social determinant of health barriers;        Provided information to [facility] [phone], call for appointment if you haven't been contacted to schedule in one week Congratulated patient on diabetes management Advised patient to keep appointment on 12/23/22 with Diabetes Nutrition  Pain:  (Status: Goal on Track (progressing): YES.) Long Term Goal  Pain assessment performed Medications reviewed-advised patient to take propranalol as instructed(twice daily), set an alarm if needed Reviewed provider established plan for pain management; Discussed importance of adherence to all scheduled medical appointments; Advised patient to report to care team affect of pain on daily activities; Discussed use of relaxation techniques and/or diversional activities to assist with pain reduction (distraction, imagery, relaxation, massage, acupressure, TENS, heat, and cold application; Advised patient to discuss PT referral to improve balance with provider; Assessed social determinant of health barriers;  Provided patient with [place] [phone], call for appointment if you haven't been contacted in one week to schedule PT   Patient Goals/Self-Care Activities: Take medications as prescribed   Attend all scheduled provider appointments Call provider office for new concerns or questions  Work with the social worker to address care coordination needs and will continue to work with the clinical team to address health care and disease management related needs take the blood sugar meter to all doctor visits drink 6 to 8 glasses  of water each day fill half of plate with vegetables

## 2022-12-18 NOTE — Patient Outreach (Signed)
Medicaid Managed Care   Nurse Care Manager Note  12/18/2022 Name:  [name] MRN:  [id number] DOB:  1961-09-23  [name] is an 62 y.o. year old female who is a primary patient of Patricia Pier, MD.  The [facility] Managed Care Coordination team was consulted for assistance with:    DMII  Pain  [name] was given information about Medicaid Managed Care Coordination team services today. Frederic Patient agreed to services and verbal consent obtained.  Engaged with patient by telephone for follow up visit in response to provider referral for case management and/or care coordination services.   Assessments/Interventions:  Review of past medical history, allergies, medications, health status, including review of consultants reports, laboratory and other test data, was performed as part of comprehensive evaluation and provision of chronic care management services.  SDOH (Social Determinants of Health) assessments and interventions performed: SDOH Interventions    Flowsheet Row Patient Outreach Telephone from 11/15/2022 in [place] Telephone from 10/03/2022 in [place]  SDOH Interventions    Food Insecurity Interventions Intervention Not Indicated --  Housing Interventions Intervention Not Indicated --  Transportation Interventions Intervention Not Indicated Intervention Not Indicated  Utilities Interventions Intervention Not Indicated --       Care Plan  Allergies  Allergen Reactions   Iron Diarrhea   Metformin And Related Nausea And Vomiting   Penicillins Nausea And Vomiting    Has patient had a PCN reaction causing immediate rash, facial/tongue/throat swelling, SOB or lightheadedness with hypotension: No Has patient had a PCN reaction causing severe rash involving mucus membranes or skin necrosis: No Has patient had a PCN reaction that required hospitalization No Has patient had a PCN reaction  occurring within the last 10 years: No If all of the above answers are "NO", then may proceed with Cephalosporin use.    Medications Reviewed Today     Reviewed by [name], RN (Registered Nurse) on 12/18/22 at [place] List Status: <None>   Medication Order Taking? Sig Documenting Provider Last Dose Status Informant  Accu-Chek FastClix Lancets MISC [id number] Yes Use as instructed to check blood sugar three times daily Patricia Pier, MD Taking Active   acetaminophen (TYLENOL) 325 MG tablet [id number] Yes Take 2 tablets (650 mg total) by mouth every 6 (six) hours as needed for moderate pain. Alfonse Spruce, FNP Taking Active   amitriptyline (ELAVIL) 25 MG tablet [id number] Yes Take 1 tablet (25 mg total) by mouth at bedtime. Patricia Pier, MD Taking Active   amLODipine (NORVASC) 10 MG tablet [id number] Yes Take 1 tablet (10 mg total) by mouth daily. Patricia Pier, MD Taking Active   atorvastatin (LIPITOR) 80 MG tablet [id number] Yes Take 1 tablet (80 mg total) by mouth daily. Patricia Pier, MD Taking Active   Blood Glucose Monitoring Suppl (ACCU-CHEK GUIDE ME) w/Device KIT [id number] Yes 1 kit by Does not apply route daily. Use as instructed to check blood sugar daily. E11.9 Patricia Pier, MD Taking Active   busPIRone (BUSPAR) 10 MG tablet [id number] No Take 10 mg by mouth 3 (three) times daily.  Patient not taking: Reported on 12/18/2022   [provider] Not Taking Active   Dulaglutide (TRULICITY) 1.5 0000000 SOPN [id number] Yes INJECT 1.'5MG'$  INTO THE SKIN Once weekly Patricia Pier, MD Taking Active   DULoxetine (CYMBALTA) 30 MG capsule [id number] Yes Take 1 tablet (30 mg) twice a day. Barnet Pall  E, NP Taking Active            Med Note (Avik Leoni A   Wed Dec 18, 2022 10:44 AM) Taking one tablet once daily  glucose blood (ACCU-CHEK GUIDE) test strip [id number] Yes USE TO test THREE TIMES DAILY Patricia Pier, MD Taking Active    levothyroxine (SYNTHROID) 50 MCG tablet [id number] Yes TAKE ONE TABLET BY MOUTH EVERY MORNING Patricia Pier, MD Taking Active   lisinopril (ZESTRIL) 20 MG tablet [id number] Yes Take 1 tablet (20 mg total) by mouth daily. Patricia Pier, MD Taking Active   omeprazole (PRILOSEC) 20 MG capsule [id number] Yes TAKE ONE CAPSULE BY MOUTH EVERY DAY Patricia Pier, MD Taking Active   propranolol (INDERAL) 80 MG tablet [id number] Yes Take 1 tablet (80 mg total) by mouth 2 (two) times daily. Patricia Pier, MD Taking Active            Med Note Thamas Jaegers, Brindle Leyba A   Wed Dec 18, 2022 10:45 AM) Taking once daily            Patient Active Problem List   Diagnosis Date Noted   Brain atrophy ([place]) 12/18/2022   Complete tear of right rotator cuff 05/16/2022   Adhesive capsulitis of right shoulder 03/14/2022   Influenza vaccine needed 10/31/2020   Need for diphtheria-tetanus-pertussis (Tdap) vaccine 10/31/2020   Hyperlipidemia associated with type 2 diabetes mellitus ([place]) 10/31/2020   Overweight (BMI 25.0-29.9) 10/31/2020   Type 2 diabetes mellitus without complication, without long-term current use of insulin ([place]) 07/31/2020   OSA (obstructive sleep apnea) 02/02/2020   Spondylosis without myelopathy or radiculopathy, lumbar region 09/17/2017   Essential hypertension 09/05/2017   Chronic bilateral low back pain with left-sided sciatica 09/05/2017   HTN (hypertension) 05/05/2017   Hypothyroidism 02/24/2017   Mixed hyperlipidemia 02/24/2017   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   Sleep related headaches 01/23/2017   Snoring 01/23/2017   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   Chronic headaches 10/30/2016   Obesity (BMI 30.0-34.9) 05/24/2016   PMB (postmenopausal bleeding) 05/24/2016   Back pain of lumbar region with sciatica 09/28/2015    Conditions to be addressed/monitored per PCP order:  DMII and pain  Care Plan : RN Care Manager Plan of Care  Updates made by [name], RN since 12/18/2022 12:00 AM     Problem: Health Management needs related to DM and Pain      Long-Range Goal: Development of Plan of Care to address Health Management needs related to DM and Pain   Start Date: 11/15/2022  Expected End Date: 02/13/2023  Note:   Current Barriers:  Chronic Disease Management support and education needs related to DMII and Pain Financial Constraints. Patient needs help affording copays for PT  RNCM Clinical Goal(s):  Patient will verbalize understanding of plan for management of DMII and Pain as evidenced by Patient reports take all medications exactly as prescribed and will call provider for medication related questions as evidenced by Patient reports and EMR documentation    attend all scheduled medical appointments: 12/23/22 for Diabetes Nutrition, 12/24/22 for MRI, 12/26/22 with Orthopedic and 02/07/23 with BSW as evidenced by Provider documentation        continue to work with [place] and/or Social Worker to address care management and care coordination needs related to DMII and Pain as evidenced by adherence to CM Team Scheduled appointments     through collaboration with Consulting civil engineer, provider, and care team.  Interventions: Inter-disciplinary care team collaboration (see longitudinal plan of care) Evaluation of current treatment plan related to  self management and patient's adherence to plan as established by provider   Diabetes:  (Status: Goal on Track (progressing): YES.) Long Term Goal   Lab Results  Component Value Date   HGBA1C 6.2 12/17/2022   @ Assessed patient's understanding of A1c goal: <7% Provided education to patient about basic DM disease process; Reviewed medications with patient and discussed importance of medication adherence;        Reviewed prescribed diet with patient advised patient to increase vegetables and fruit in her diet; Discussed plans with patient for ongoing care management follow up and provided patient  with direct contact information for care management team;      Review of patient status, including review of consultants reports, relevant laboratory and other test results, and medications completed;       Assessed social determinant of health barriers;        Provided information to [facility] [phone], call for appointment if you haven't been contacted to schedule in one week Congratulated patient on diabetes management Advised patient to keep appointment on 12/23/22 with Diabetes Nutrition  Pain:  (Status: Goal on Track (progressing): YES.) Long Term Goal  Pain assessment performed Medications reviewed-advised patient to take propranalol as instructed(twice daily), set an alarm if needed Reviewed provider established plan for pain management; Discussed importance of adherence to all scheduled medical appointments; Advised patient to report to care team affect of pain on daily activities; Discussed use of relaxation techniques and/or diversional activities to assist with pain reduction (distraction, imagery, relaxation, massage, acupressure, TENS, heat, and cold application; Advised patient to discuss PT referral to improve balance with provider; Assessed social determinant of health barriers;  Provided patient with [place] [phone], call for appointment if you haven't been contacted in one week to schedule PT   Patient Goals/Self-Care Activities: Take medications as prescribed   Attend all scheduled provider appointments Call provider office for new concerns or questions  Work with the social worker to address care coordination needs and will continue to work with the clinical team to address health care and disease management related needs take the blood sugar meter to all doctor visits drink 6 to 8 glasses of water each day fill half of plate with vegetables       Follow Up:  Patient agrees to Care Plan and Follow-up.  Plan:  The Managed Medicaid care management team will reach out to the patient again over the next 30 days.  Date/time of next scheduled RN care management/care coordination outreach:  01/17/23 at 10:30am  [name] RN, BSN [place] RN Care Coordinator

## 2022-12-23 ENCOUNTER — Ambulatory Visit: Payer: Medicaid Other | Admitting: Registered"

## 2022-12-24 ENCOUNTER — Ambulatory Visit
Admission: RE | Admit: 2022-12-24 | Discharge: 2022-12-24 | Disposition: A | Discharge status: Home / Self Care | Payer: Medicaid Other | Source: Ambulatory Visit | Attending: Surgical | Admitting: Surgical

## 2022-12-24 DIAGNOSIS — M5412 Radiculopathy, cervical region: Secondary | ICD-10-CM

## 2022-12-25 ENCOUNTER — Telehealth: Payer: Self-pay

## 2022-12-25 ENCOUNTER — Other Ambulatory Visit: Payer: Self-pay

## 2022-12-25 DIAGNOSIS — M5412 Radiculopathy, cervical region: Secondary | ICD-10-CM

## 2022-12-25 NOTE — Telephone Encounter (Signed)
-----   Message from Donella Stade, PA-C sent at 12/25/2022 11:19 AM EST ----- [name], I called Ivin Booty and we discussed her MRI results.  I recommended getting her set up with Dr. Ernestina Patches for cervical spine ESI.  Also think that she should follow-up with Dr. Laurance Flatten after her [facility] after 1 to 2 weeks to see how she is doing.  I  answered her questions and told her that she does not have to come to her appointment tomorrow with Dr. Marlou Sa but she is welcome to come if she would like to go over the images in person.

## 2022-12-25 NOTE — Telephone Encounter (Signed)
Referrals placed appointment cancelled.

## 2022-12-25 NOTE — Progress Notes (Signed)
[name], I called Maurina and we discussed her MRI results.  I recommended getting her set up with Dr. Ernestina Patches for cervical spine ESI.  Also think that she should follow-up with Dr. Laurance Flatten after her [facility] after 1 to 2 weeks to see how she is doing.  I answered her questions and told her that she does not have to come to her appointment tomorrow with Dr. Marlou Sa but she is welcome to come if she would like to go over the images in person.

## 2022-12-26 ENCOUNTER — Telehealth: Payer: Self-pay | Admitting: Orthopedic Surgery

## 2022-12-26 ENCOUNTER — Ambulatory Visit: Payer: Medicaid Other | Admitting: Orthopedic Surgery

## 2022-12-26 ENCOUNTER — Encounter: Payer: Self-pay | Admitting: Orthopedic Surgery

## 2022-12-26 NOTE — Telephone Encounter (Signed)
Patient asking if she still needs to keep appointment due to already speaking to [facility] about everything. Please advise.

## 2022-12-26 NOTE — Telephone Encounter (Signed)
No she does not need to come in today if she does not want to

## 2022-12-27 ENCOUNTER — Encounter: Payer: Self-pay | Admitting: Internal Medicine

## 2022-12-30 ENCOUNTER — Encounter (HOSPITAL_COMMUNITY): Payer: Self-pay

## 2022-12-30 ENCOUNTER — Emergency Department (HOSPITAL_COMMUNITY)
Admission: EM | Admit: 2022-12-30 | Discharge: 2022-12-30 | Discharge status: Against Medical Advice | Payer: 59 | Attending: Emergency Medicine | Admitting: Emergency Medicine

## 2022-12-30 DIAGNOSIS — R197 Diarrhea, unspecified: Secondary | ICD-10-CM | POA: Insufficient documentation

## 2022-12-30 DIAGNOSIS — R55 Syncope and collapse: Secondary | ICD-10-CM | POA: Diagnosis not present

## 2022-12-30 DIAGNOSIS — R42 Dizziness and giddiness: Secondary | ICD-10-CM | POA: Diagnosis not present

## 2022-12-30 DIAGNOSIS — R1110 Vomiting, unspecified: Secondary | ICD-10-CM | POA: Insufficient documentation

## 2022-12-30 DIAGNOSIS — E119 Type 2 diabetes mellitus without complications: Secondary | ICD-10-CM | POA: Insufficient documentation

## 2022-12-30 DIAGNOSIS — Z5321 Procedure and treatment not carried out due to patient leaving prior to being seen by health care provider: Secondary | ICD-10-CM | POA: Diagnosis not present

## 2022-12-30 LAB — CBC WITH DIFFERENTIAL/PLATELET
Abs Immature Granulocytes: 0.07 10*3/uL (ref 0.00–0.07)
Basophils Absolute: 0.1 10*3/uL (ref 0.0–0.1)
Basophils Relative: 1 %
Eosinophils Absolute: 0.1 10*3/uL (ref 0.0–0.5)
Eosinophils Relative: 1 %
HCT: 49 % — ABNORMAL HIGH (ref 36.0–46.0)
Hemoglobin: 16.1 g/dL — ABNORMAL HIGH (ref 12.0–15.0)
Immature Granulocytes: 1 %
Lymphocytes Relative: 18 %
Lymphs Abs: 2.7 10*3/uL (ref 0.7–4.0)
MCH: 31.7 pg (ref 26.0–34.0)
MCHC: 32.9 g/dL (ref 30.0–36.0)
MCV: 96.5 fL (ref 80.0–100.0)
Monocytes Absolute: 0.9 10*3/uL (ref 0.1–1.0)
Monocytes Relative: 6 %
Neutro Abs: 11.2 10*3/uL — ABNORMAL HIGH (ref 1.7–7.7)
Neutrophils Relative %: 73 %
Platelets: 392 10*3/uL (ref 150–400)
RBC: 5.08 MIL/uL (ref 3.87–5.11)
RDW: 12.5 % (ref 11.5–15.5)
WBC: 14.9 10*3/uL — ABNORMAL HIGH (ref 4.0–10.5)
nRBC: 0 % (ref 0.0–0.2)

## 2022-12-30 LAB — URINALYSIS, ROUTINE W REFLEX MICROSCOPIC
Glucose, UA: NEGATIVE mg/dL
Hgb urine dipstick: NEGATIVE
Ketones, ur: 5 mg/dL — AB
Nitrite: NEGATIVE
Protein, ur: 100 mg/dL — AB
Specific Gravity, Urine: 1.03 (ref 1.005–1.030)
pH: 5 (ref 5.0–8.0)

## 2022-12-30 LAB — COMPREHENSIVE METABOLIC PANEL
ALT: 27 U/L (ref 0–44)
AST: 24 U/L (ref 15–41)
Albumin: 4.6 g/dL (ref 3.5–5.0)
Alkaline Phosphatase: 114 U/L (ref 38–126)
Anion gap: 12 (ref 5–15)
BUN: 17 mg/dL (ref 8–23)
CO2: 24 mmol/L (ref 22–32)
Calcium: 9.6 mg/dL (ref 8.9–10.3)
Chloride: 102 mmol/L (ref 98–111)
Creatinine, Ser: 1.11 mg/dL — ABNORMAL HIGH (ref 0.44–1.00)
GFR, Estimated: 57 mL/min — ABNORMAL LOW (ref >60)
Glucose, Bld: 137 mg/dL — ABNORMAL HIGH (ref 70–99)
Potassium: 4.4 mmol/L (ref 3.5–5.1)
Sodium: 138 mmol/L (ref 135–145)
Total Bilirubin: 0.7 mg/dL (ref 0.3–1.2)
Total Protein: 8.6 g/dL — ABNORMAL HIGH (ref 6.5–8.1)

## 2022-12-30 LAB — CBG MONITORING, ED: Glucose-Capillary: 146 mg/dL — ABNORMAL HIGH (ref 70–99)

## 2022-12-30 MED ORDER — ACETAMINOPHEN 325 MG PO TABS
650.0000 mg | ORAL_TABLET | Freq: Four times a day (QID) | ORAL | Status: DC | PRN
  Administered 2022-12-30: 650 mg via ORAL
  Filled 2022-12-30: qty 2

## 2022-12-30 MED ORDER — ONDANSETRON 4 MG PO TBDP
4.0000 mg | ORAL_TABLET | Freq: Once | ORAL | Status: AC
  Administered 2022-12-30: 4 mg via ORAL
  Filled 2022-12-30: qty 1

## 2022-12-30 NOTE — ED Triage Notes (Signed)
Pt to ED via [facility] c/o emesis and near syncopal. Pt was grocery shopping, went to the restroom to urinate, pt felt unwell, became dizzy, hot, and had emesis episode. No fall , No LOC. AA&O X 4.   HX: DM, Fibromyalgia  Last VS 128/94, p66, 98%RA, CBG 132.   No medications given by EMS

## 2022-12-30 NOTE — ED Notes (Signed)
The patient and her family member stated that they would need assistance getting back to their residence due to them not having transportation. They stated that they lived in [place].

## 2022-12-30 NOTE — ED Notes (Signed)
Pt stated that they were leaving without being seen since they didn't have transportation.

## 2022-12-30 NOTE — ED Provider Triage Note (Signed)
Emergency Medicine Provider Triage Evaluation Note  [name] , a 62 y.o. female  was evaluated in triage.  Pt complains of having an episode of diarrhea and then vomiting.  Pt reports she felt like she was going to pass out.    Review of Systems  Positive: nausea and abdominal cramping  Negative:no fever   Physical Exam  BP 121/86 (BP Location: Right Arm)   Pulse 62   Temp (!) 97.5 F (36.4 C) (Oral)   Resp 16   Ht '5\' 7"'$  (1.702 m)   Wt 82.1 kg   LMP 06/06/2015   SpO2 100%   BMI 28.35 kg/m  Gen:   Awake, no distress   Resp:  Normal effort  MSK:   Moves extremities without difficulty  Other:    Medical Decision Making  Medically screening exam initiated at 4:20 PM.  Appropriate orders placed.  Saphyre Weghorst Eichler was informed that the remainder of the evaluation will be completed by another provider, this initial triage assessment does not replace that evaluation, and the importance of remaining in the ED until their evaluation is complete.     Fransico Meadow, [place] 12/30/22 1622

## 2023-01-13 ENCOUNTER — Encounter: Payer: Self-pay | Admitting: Orthopedic Surgery

## 2023-01-16 ENCOUNTER — Other Ambulatory Visit (INDEPENDENT_AMBULATORY_CARE_PROVIDER_SITE_OTHER): Payer: 59

## 2023-01-16 ENCOUNTER — Encounter: Payer: Self-pay | Admitting: Internal Medicine

## 2023-01-16 ENCOUNTER — Ambulatory Visit (INDEPENDENT_AMBULATORY_CARE_PROVIDER_SITE_OTHER): Payer: 59 | Admitting: Orthopedic Surgery

## 2023-01-16 ENCOUNTER — Encounter: Payer: Self-pay | Admitting: Orthopedic Surgery

## 2023-01-16 VITALS — BP 103/75 | HR 80 | Ht 67.0 in | Wt 181.0 lb

## 2023-01-16 DIAGNOSIS — M5412 Radiculopathy, cervical region: Secondary | ICD-10-CM

## 2023-01-16 NOTE — Progress Notes (Addendum)
Orthopedic Spine Surgery Office Note  Assessment: Patient is a 62 y.o. female with neck pain that radiates into bilateral upper extremities.  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 The most common natural history of cervical myelopathy was explained to the patient, which included stepwise decline in function. Given this course, operative management in the form of C4-7 anterior cervical discectomy and fusion was recommended to the patient. The risks, including but not limited to pseudarthrosis, dysphagia, hematoma, airway compromise, recurrent laryngeal nerve injury, esophageal perforation, durotomy, spinal cord injury, nerve root injury, persistent pain, adjacent segment disease, infection, bleeding, hardware failure, vascular injury, heart attack, death, stroke, fracture, and need for additional procedures were discussed with the patient. The benefit of surgery would be preventing progression of the myelopathy and not to reverse any myelopathic symptoms. Explained that a secondary goal would be relief of the pain radiating into her upper extremities, but she may not get full relief of her pain with this surgery, especially any neck pain. The alternatives to surgical management would be continued monitoring, physical therapy, over-the-counter pain medications, ambulatory aids, and activity modification. I reiterated that these modalities would not change the natural history of the disease and that is why  operative management has been recommended. All the patient's questions were answered to her satisfaction. After this discussion, the patient expressed understanding and elected to proceed with surgical intervention.      ___________________________________________________________________________   History:  Patient is a 62 y.o. female who presents today for neck pain that radiates into her bilateral upper extremities.  She states that she has had pain starting in her neck going into her right and left shoulder for the last 2 years.  It is gotten progressively worse with time.  She feels it goes into the right side of the neck and into the lateral shoulder.  On the left side, she feels it in the lateral shoulder as well and then it goes down the dorsal aspect of the forearm.  It does not radiate to the hand.   Weakness: Yes, feels her hands are weaker and has had trouble with simple tasks such as opening jars.  No other weakness noted Difficulty with fine motor skills (e.g., buttoning shirts, handwriting): Yes, feels like she is been dropping objects a lot recently Symptoms of imbalance: Yes, feels off balance frequently and has to use a handrail when going up stairs Paresthesias and numbness: Denies Bowel or bladder incontinence: Denies Saddle anesthesia: Denies  Treatments tried: Shoulder injections, physical therapy, Tylenol, NSAIDs  Review of systems: Denies fevers and chills, night sweats, unexplained weight loss, history of cancer.  Has had pain that wakes her at night  Past medical history: Hyperlipidemia Hypertension Migraines Depression/anxiety Fibromyalgia GERD  Allergies: Penicillin, metformin  Past surgical history:  Breast cyst excision  Social history: Denies use of nicotine product (smoking, vaping, patches, smokeless) Alcohol use: Denies Denies recreational drug use   Physical Exam:  General: no acute distress, appears stated age Neurologic: alert,  answering questions appropriately, following commands Respiratory: unlabored breathing on room  air, symmetric chest rise Psychiatric: appropriate affect, normal cadence to speech   MSK (spine):  -Strength exam      Left  Right Grip strength                5/5  5/5 Interosseus   5/5   5/5 Wrist extension  5/5  5/5 Wrist flexion   5/5  5/5 Elbow flexion   5/5  5/5 Deltoid    5/5  5/5  EHL    5/5  5/5 TA    5/5  5/5 GSC    5/5  5/5 Knee extension  5/5  5/5 Hip flexion   5/5  5/5  -Sensory exam    Sensation intact to light touch in L3-S1 nerve distributions of bilateral lower extremities  Sensation intact to light touch in C5-T1 nerve distributions of bilateral upper extremities  -Brachioradialis DTR: 2/4 on the left, 2/4 on the right -Biceps DTR: 2/4 on the left, 2/4 on the right -Triceps DTR: 2/4 on the left, 2/4 on the right -Achilles DTR: 2/4 on the left, 2/4 on the right -Patellar tendon DTR: 3/4 on the left, 3/4 on the right  -Spurling: Negative bilaterally -Hoffman sign: Positive on the right, negative on the left -Interosseous atrophy: None seen -Grip and release test: Negative -Clonus: No beats bilaterally -Romberg: Positive -Gait: Wide-based -Imbalance with tandem gait: Yes  Tinel's at wrist: Negative bilaterally Phalen's at wrist: Negative bilaterally Durkan's: Negative bilaterally  Tinel's at elbow: Negative bilaterally  -Left shoulder exam: No pain through range of motion, negative Jobe, negative belly press, no weakness with external rotation with arm at side -Right shoulder exam: Pain with external rotation past 90 degrees, no pain through remainder of range of motion, no pain or weakness with Jobe test, negative belly press, no weakness with external rotation with arm at side  Imaging: XR of the cervical spine from 12/02/2022 and 01/16/2023 was independently reviewed and interpreted, showing no fracture or dislocation.  No evidence of instability on  flexion/extension views.  Disc height loss with anterior osteophyte formation at C4/5, C5/6, C6/7.  MRI of the cervical spine from 12/24/2022 was independently reviewed and interpreted, showing central stenosis and left-sided foraminal stenosis at C4/5, central and bilateral foraminal stenosis at C5/6, bilateral foraminal stenosis at C6/7.  Degenerative disc disease at C5/6 and C6/7.  No T2 cord signal change.   Patient name: [name] Patient MRN: [id number] Date of visit: 01/16/23

## 2023-01-16 NOTE — Progress Notes (Signed)
Pre-operative Scores  NDI: 46 mJOA: 14 SF-36:  -Physical functioning: 25  -Role limitations due to physical health: 0  -Role limitations due to emotional problems: 0  -Energy/fatigue: 20  -Emotional well-being: 40  -Social functioning: 37.5  -Pain: 45  -General health: 25  Callie Fielding, MD Orthopedic Surgeon

## 2023-01-17 ENCOUNTER — Other Ambulatory Visit: Payer: Self-pay | Admitting: Internal Medicine

## 2023-01-17 ENCOUNTER — Encounter: Payer: Self-pay | Admitting: Internal Medicine

## 2023-01-17 ENCOUNTER — Other Ambulatory Visit: Payer: Self-pay

## 2023-01-17 ENCOUNTER — Other Ambulatory Visit: Payer: Medicaid Other | Admitting: *Deleted

## 2023-01-17 MED ORDER — TRULICITY 1.5 MG/0.5ML ~~LOC~~ SOAJ
1.5000 mg | SUBCUTANEOUS | 6 refills | Status: DC
  Filled 2023-01-17: qty 2, 28d supply, fill #0

## 2023-01-17 NOTE — Patient Instructions (Signed)
Visit Information  [name]  - as a part of your Medicaid benefit, you are eligible for care management and care coordination services at no cost or copay. I was unable to reach you by phone today but would be happy to help you with your health related needs. Please feel free to call me @ [phone].   A member of the Managed Medicaid care management team will reach out to you again over the next 7 days.   [name] RN, BSN [facility] RN Care Coordinator [phone]

## 2023-01-17 NOTE — Patient Outreach (Signed)
  Medicaid Managed Care   Unsuccessful Attempt Note   01/17/2023 Name: [name] MRN: [id number] DOB: [birth year]  Referred by: Ladell Pier, MD Reason for referral : High Risk Managed Medicaid (Unsuccessful RNCM follow up telephone outreach)   An unsuccessful telephone outreach was attempted today. The patient was referred to the case management team for assistance with care management and care coordination.    Follow Up Plan: The Managed Medicaid care management team will reach out to the patient again over the next 7 days.    [name] RN, BSN [place] [facility] RN Care Coordinator [phone]

## 2023-01-18 ENCOUNTER — Encounter: Payer: Self-pay | Admitting: Orthopedic Surgery

## 2023-01-20 ENCOUNTER — Other Ambulatory Visit: Payer: Self-pay

## 2023-01-21 ENCOUNTER — Other Ambulatory Visit: Payer: Self-pay

## 2023-01-21 ENCOUNTER — Telehealth: Payer: Self-pay

## 2023-01-21 NOTE — Telephone Encounter (Signed)
..   Medicaid Managed Care   Unsuccessful Outreach Note  01/21/2023 Name: [name] MRN: [id number] DOB: 06-24-1961  Referred by: Ladell Pier, MD Reason for referral : Appointment (I called the patient today to reschedule her missed phone appointment with the MM RNCM.)   A second unsuccessful telephone outreach was attempted today. The patient was referred to the case management team for assistance with care management and care coordination.   Follow Up Plan: The care management team will reach out to the patient again over the next 7 days.   [place]  [phone]

## 2023-01-22 ENCOUNTER — Other Ambulatory Visit: Payer: Self-pay

## 2023-01-23 ENCOUNTER — Other Ambulatory Visit: Payer: Self-pay

## 2023-01-24 ENCOUNTER — Telehealth: Payer: Self-pay | Admitting: Orthopedic Surgery

## 2023-01-24 NOTE — Telephone Encounter (Signed)
Patient is having some issues with her Pre op with her PCP, she is unable to to get into her PCP till July and doesn't know what to do?

## 2023-01-27 ENCOUNTER — Telehealth: Payer: Self-pay

## 2023-01-27 NOTE — Telephone Encounter (Signed)
Patient is scheduled to see [facility]ian [facility] for pre-op on 02/12/23

## 2023-01-27 NOTE — Telephone Encounter (Signed)
I reached out to the patient today after she left me a voicemail about transportation. She did not answer when I called her back and her voicemail was full so I was not able to leave her a message.   Weston Settle Care Guide  [facility] Managed  [facility]  [phone]

## 2023-01-28 ENCOUNTER — Other Ambulatory Visit: Payer: Self-pay

## 2023-01-28 ENCOUNTER — Ambulatory Visit: Payer: 59 | Admitting: Internal Medicine

## 2023-01-29 ENCOUNTER — Other Ambulatory Visit: Payer: Self-pay

## 2023-01-29 ENCOUNTER — Encounter: Payer: Self-pay | Admitting: Orthopedic Surgery

## 2023-01-30 ENCOUNTER — Telehealth: Payer: Self-pay

## 2023-01-30 NOTE — Telephone Encounter (Signed)
..   Medicaid Managed Care   Unsuccessful Outreach Note  01/30/2023 Name: [name] MRN: [id number] DOB: 09-27-1961  Referred by: Marcine Matar, MD Reason for referral : Appointment (I called to reschedule her missed phone appointment with the MM RNCM. I was not able to leave a message.)   A second unsuccessful telephone outreach was attempted today. The patient was referred to the case management team for assistance with care management and care coordination.   Follow Up Plan: The care management team will reach out to the patient again over the next 7 days.   Weston Settle Care Guide  [facility] Managed  [facility]  [phone]

## 2023-02-02 ENCOUNTER — Encounter: Payer: Self-pay | Admitting: Orthopedic Surgery

## 2023-02-03 ENCOUNTER — Other Ambulatory Visit: Payer: Self-pay

## 2023-02-03 ENCOUNTER — Encounter: Payer: Self-pay | Admitting: Internal Medicine

## 2023-02-04 ENCOUNTER — Other Ambulatory Visit: Payer: 59 | Admitting: *Deleted

## 2023-02-04 NOTE — Patient Outreach (Signed)
   Embedded Care Coordination  Case Closure Note  02/04/2023 Name: [name] MRN: [id number] DOB: 12/19/1960  [name] is a 62 y.o. year old female who is a primary care patient of Marcine Matar, MD. The Embedded Care Coordination team was consulted for assistance with chronic disease management and care coordination needs related to DM and Pain  We have been unable to make contact with the patient for follow up. The care management team is available to follow up with the patient after provider conversation with the patient regarding recommendation for care management engagement and subsequent re-referral to the care management team [phone]).   If patient returns call to provider office and is in need of assistance from the embedded care coordination team, please advise that the patient call the [facility] Care Guide at [phone] for assistance.   Estanislado Emms RN, BSN [place]  Managed [facility] RN Care Coordinator [phone]

## 2023-02-05 ENCOUNTER — Ambulatory Visit: Payer: Medicaid Other | Admitting: Registered"

## 2023-02-05 ENCOUNTER — Telehealth: Payer: Self-pay | Admitting: Physical Medicine and Rehabilitation

## 2023-02-05 NOTE — Telephone Encounter (Signed)
Patient called in and stated she no longer needs the injection. Appointment cancelled.

## 2023-02-05 NOTE — Telephone Encounter (Signed)
Patient called, would like to cxl her appointment with Dr. Alvester Morin. Don't need.

## 2023-02-06 ENCOUNTER — Encounter: Payer: 59 | Admitting: Physical Medicine and Rehabilitation

## 2023-02-07 ENCOUNTER — Other Ambulatory Visit: Payer: Medicaid Other

## 2023-02-07 NOTE — Patient Outreach (Signed)
Medicaid Managed Care Social Work Note  02/07/2023 Name:  [name] MRN:  [id number] DOB:  [birth year]  [name] is an 62 y.o. year old female who is a primary patient of Patricia Matar, MD.  The Medicaid Managed Care Coordination team was consulted for assistance with:  Community Resources   [name] was given information about Medicaid Managed Care Coordination team services today. [name] Patient agreed to services and verbal consent obtained.  Engaged with patient  for by telephone forfollow up visit in response to referral for case management and/or care coordination services.   Assessments/Interventions:  Review of past medical history, allergies, medications, health status, including review of consultants reports, laboratory and other test data, was performed as part of comprehensive evaluation and provision of chronic care management services.  SDOH: (Social Determinant of Health) assessments and interventions performed: SDOH Interventions    Flowsheet Row Patient Outreach Telephone from 11/15/2022 in [place] POPULATION HEALTH DEPARTMENT Telephone from 10/03/2022 in [place] POPULATION HEALTH DEPARTMENT  SDOH Interventions    Food Insecurity Interventions Intervention Not Indicated --  Housing Interventions Intervention Not Indicated --  Transportation Interventions Intervention Not Indicated Intervention Not Indicated  Utilities Interventions Intervention Not Indicated --     BSW completed a telephone outreach with patient, Patient states everything is going well, all barriers have been resolved and no resources are needed at this time.   Advanced Directives Status:  Not addressed in this encounter.  Care Plan                 Allergies  Allergen Reactions   Iron Diarrhea   Metformin And Related Nausea And Vomiting   Penicillins Nausea And Vomiting    Has patient had a PCN reaction causing immediate rash, facial/tongue/throat swelling, SOB or  lightheadedness with hypotension: No Has patient had a PCN reaction causing severe rash involving mucus membranes or skin necrosis: No Has patient had a PCN reaction that required hospitalization No Has patient had a PCN reaction occurring within the last 10 years: No If all of the above answers are "NO", then may proceed with Cephalosporin use.    Medications Reviewed Today     Reviewed by [name], [name], RT (Technologist) on 01/16/23 at 1526  Med List Status: <None>   Medication Order Taking? Sig Documenting Provider Last Dose Status Informant  Accu-Chek FastClix Lancets MISC [id number] No Use as instructed to check blood sugar three times daily Patricia Matar, MD Taking Active   acetaminophen (TYLENOL) 325 MG tablet [id number] No Take 2 tablets (650 mg total) by mouth every 6 (six) hours as needed for moderate pain. Lizbeth Bark, FNP Taking Active   amitriptyline (ELAVIL) 25 MG tablet [id number] No Take 1 tablet (25 mg total) by mouth at bedtime. Patricia Matar, MD Taking Active   amLODipine (NORVASC) 10 MG tablet [id number]  Take 1 tablet (10 mg total) by mouth daily. Patricia Matar, MD  Active   atorvastatin (LIPITOR) 80 MG tablet [id number]  Take 1 tablet (80 mg total) by mouth daily. Patricia Matar, MD  Active   Blood Glucose Monitoring Suppl (ACCU-CHEK GUIDE ME) w/Device KIT [id number] No 1 kit by Does not apply route daily. Use as instructed to check blood sugar daily. E11.9 Patricia Matar, MD Taking Active   busPIRone (BUSPAR) 10 MG tablet [id number] No Take 10 mg by mouth 3 (three) times daily.  Patient not taking: Reported on 12/18/2022   [provider] Not Taking Active   Dulaglutide (TRULICITY) 1.5 MG/0.5ML SOPN [id number] No INJECT 1.5MG  INTO THE SKIN Once weekly Patricia Matar, MD Taking Active   DULoxetine (CYMBALTA) 30 MG capsule [id number]  Take 1 capsule (30 mg total) by mouth daily. Take 1 tablet (30 mg) twice a day. Patricia Matar,  MD  Active   glucose blood (ACCU-CHEK GUIDE) test strip [id number] No USE TO test THREE TIMES DAILY Patricia Matar, MD Taking Active   levothyroxine (SYNTHROID) 50 MCG tablet [id number]  Take 1 tablet (50 mcg total) by mouth every morning. Patricia Matar, MD  Active   lisinopril (ZESTRIL) 20 MG tablet [id number] No Take 1 tablet (20 mg total) by mouth daily. Patricia Matar, MD Taking Active   omeprazole (PRILOSEC) 20 MG capsule [id number] No TAKE ONE CAPSULE BY MOUTH EVERY DAY Patricia Matar, MD Taking Active   propranolol (INDERAL) 80 MG tablet [id number] No Take 1 tablet (80 mg total) by mouth 2 (two) times daily. Patricia Matar, MD Taking Active            Med Note [name], [name]   Wed Dec 18, 2022 10:45 AM) Taking once daily            Patient Active Problem List   Diagnosis Date Noted   Brain atrophy 12/18/2022   Complete tear of right rotator cuff 05/16/2022   Adhesive capsulitis of right shoulder 03/14/2022   Influenza vaccine needed 10/31/2020   Need for diphtheria-tetanus-pertussis (Tdap) vaccine 10/31/2020   Hyperlipidemia associated with type 2 diabetes mellitus 10/31/2020   Overweight (BMI 25.0-29.9) 10/31/2020   Type 2 diabetes mellitus without complication, without long-term current use of insulin 07/31/2020   OSA (obstructive sleep apnea) 02/02/2020   Spondylosis without myelopathy or radiculopathy, lumbar region 09/17/2017   Essential hypertension 09/05/2017   Chronic bilateral low back pain with left-sided sciatica 09/05/2017   HTN (hypertension) 05/05/2017   Hypothyroidism 02/24/2017   Mixed hyperlipidemia 02/24/2017   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   Sleep related headaches 01/23/2017   Snoring 01/23/2017   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   Chronic headaches 10/30/2016   Obesity (BMI 30.0-34.9) 05/24/2016   PMB (postmenopausal bleeding) 05/24/2016   Back pain of lumbar region with sciatica 09/28/2015    Conditions to be  addressed/monitored per PCP order:   community resources  There are no care plans that you recently modified to display for this patient.   Follow up:  Patient requests no follow-up at this time.  Plan: The  Patient has been provided with contact information for the Managed Medicaid care management team and has been advised to call with any health related questions or concerns.    Abelino Derrick, MHA [facility]  Managed [facility] Social Worker [phone]

## 2023-02-07 NOTE — Patient Instructions (Signed)
Visit Information  [name] was given information about Medicaid Managed Care team care coordination services as a part of their [facility] Community Plan Medicaid benefit. [name] verbally consented to engagement with the [facility] Managed Care team.   If you are experiencing a medical emergency, please call 911 or report to your local emergency department or urgent care.   If you have a non-emergency medical problem during routine business hours, please contact your provider's office and ask to speak with a nurse.   For questions related to your [facility], please call: [phone] or visit the homepage here: [email or web address]  If you would like to schedule transportation through your [facility], please call the following number at least 2 days in advance of your appointment: [phone]   Rides for urgent appointments can also be made after hours by calling Member Services.  Call the Behavioral Health Crisis Line at [phone], at any time, 24 hours a day, 7 days a week. If you are in danger or need immediate medical attention call 911.  If you would like help to quit smoking, call 1-800-QUIT-NOW (956-025-4782) OR Espaol: 1-855-Djelo-Ya (1-324-401-0272) o para ms informacin haga clic aqu or Text READY to 536-644 to register via text  [name] - following are the goals we discussed in your visit today:   Goals Addressed   None       The  Patient                                              has been provided with contact information for the Managed Medicaid care management team and has been advised to call with any health related questions or concerns.   Gus Puma, Kenard Gower, MHA [facility]  Managed Medicaid Social Worker [phone]   Following is a copy of your plan of care:  There are no care plans that you recently modified to display for  this patient.

## 2023-02-12 ENCOUNTER — Ambulatory Visit: Payer: 59 | Admitting: Physician Assistant

## 2023-02-13 ENCOUNTER — Telehealth: Payer: Self-pay | Admitting: Internal Medicine

## 2023-02-13 ENCOUNTER — Encounter: Payer: Self-pay | Admitting: Internal Medicine

## 2023-02-13 ENCOUNTER — Ambulatory Visit: Payer: 59 | Attending: Internal Medicine | Admitting: Internal Medicine

## 2023-02-13 ENCOUNTER — Encounter: Payer: Self-pay | Admitting: Orthopedic Surgery

## 2023-02-13 VITALS — BP 111/72 | HR 73 | Temp 98.0°F | Ht 67.0 in | Wt 184.0 lb

## 2023-02-13 DIAGNOSIS — I152 Hypertension secondary to endocrine disorders: Secondary | ICD-10-CM | POA: Diagnosis not present

## 2023-02-13 DIAGNOSIS — E1169 Type 2 diabetes mellitus with other specified complication: Secondary | ICD-10-CM

## 2023-02-13 DIAGNOSIS — E039 Hypothyroidism, unspecified: Secondary | ICD-10-CM

## 2023-02-13 DIAGNOSIS — E785 Hyperlipidemia, unspecified: Secondary | ICD-10-CM | POA: Diagnosis not present

## 2023-02-13 DIAGNOSIS — Z01818 Encounter for other preprocedural examination: Secondary | ICD-10-CM | POA: Diagnosis not present

## 2023-02-13 DIAGNOSIS — E1159 Type 2 diabetes mellitus with other circulatory complications: Secondary | ICD-10-CM

## 2023-02-13 MED ORDER — LANTUS SOLOSTAR 100 UNIT/ML ~~LOC~~ SOPN: 5.0000 [IU] | PEN_INJECTOR | Freq: Every day | SUBCUTANEOUS | 0 refills | Status: DC

## 2023-02-13 NOTE — Patient Instructions (Signed)
Stop Trulicity for 7 full days prior to your surgery.  Monitor your blood sugars three times a day during this time.  If blood sugars start to increase, start Lantus insulin 5 units at bedtime to cover your blood sugars while off Trulicity.  After your surgery, once you are eating normally, you can stop the insulin and restart the Trulicity.

## 2023-02-13 NOTE — Telephone Encounter (Signed)
Marchelle Folks calling from [facility] is calling to report that the taxi voucher was not handed to the  driver. Wanting to know if this can be be emailed?  [email or web address] CB- [phone]

## 2023-02-13 NOTE — Progress Notes (Signed)
Patient ID: [name], female    DOB: 1961/03/30  MRN: [id number]  CC: Pre-op Exam (Pre-op. )   Subjective: [name] is a 62 y.o. female who presents for pre-op eval Her concerns today include:  HTN, HL, depression, hypothyroid, DM, obesity, tension HA, chronic LBP (minimal degen disc ds on MRI).    Patient is scheduled to have C4-7 anterior cervical discectomy and fusion by Dr. Willia Craze. No previous surgeries. Walks outside around her apartment complex when weather permits.  Walks at moderate pace for 20 mins about 2-3 days/wk.  Also walks flight stairs in her apartment to get to her BR -no CP/SOB with exercise -Last CBC done 12/30/2022 in ER where she was seen for vomiting and diarrhea.  WBC and Hb were elev  -checks BS TID before meals; range has been 130s. Last A1C 2 mths was 6.2.  On Trulicity 1.5 mg Q wk -she has been taking Levothyroxine consistently.  Last TSH 1.34 2 mths ago  -Taking Norvasc 10 mg and Lisinopril 20 mg daily.  Checks BP intermittently and it has been good  Patient Active Problem List   Diagnosis Date Noted   Brain atrophy 12/18/2022   Complete tear of right rotator cuff 05/16/2022   Adhesive capsulitis of right shoulder 03/14/2022   Influenza vaccine needed 10/31/2020   Need for diphtheria-tetanus-pertussis (Tdap) vaccine 10/31/2020   Hyperlipidemia associated with type 2 diabetes mellitus 10/31/2020   Overweight (BMI 25.0-29.9) 10/31/2020   Type 2 diabetes mellitus without complication, without long-term current use of insulin 07/31/2020   OSA (obstructive sleep apnea) 02/02/2020   Spondylosis without myelopathy or radiculopathy, lumbar region 09/17/2017   Essential hypertension 09/05/2017   Chronic bilateral low back pain with left-sided sciatica 09/05/2017   HTN (hypertension) 05/05/2017   Hypothyroidism 02/24/2017   Mixed hyperlipidemia 02/24/2017   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   Sleep related headaches 01/23/2017   Snoring  01/23/2017   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   Chronic headaches 10/30/2016   Obesity (BMI 30.0-34.9) 05/24/2016   PMB (postmenopausal bleeding) 05/24/2016   Back pain of lumbar region with sciatica 09/28/2015     Current Outpatient Medications on File Prior to Visit  Medication Sig Dispense Refill   Accu-Chek FastClix Lancets MISC Use as instructed to check blood sugar three times daily 102 each 6   acetaminophen (TYLENOL) 325 MG tablet Take 2 tablets (650 mg total) by mouth every 6 (six) hours as needed for moderate pain. 30 tablet    amitriptyline (ELAVIL) 25 MG tablet Take 1 tablet (25 mg total) by mouth at bedtime. 30 tablet 0   amLODipine (NORVASC) 10 MG tablet Take 1 tablet (10 mg total) by mouth daily. 90 tablet 1   atorvastatin (LIPITOR) 80 MG tablet Take 1 tablet (80 mg total) by mouth daily. 90 tablet 1   Blood Glucose Monitoring Suppl (ACCU-CHEK GUIDE ME) w/Device KIT 1 kit by Does not apply route daily. Use as instructed to check blood sugar daily. E11.9 1 kit 0   busPIRone (BUSPAR) 10 MG tablet Take 10 mg by mouth 3 (three) times daily.     Dulaglutide (TRULICITY) 1.5 MG/0.5ML SOPN Inject 1.5 mg into the skin once a week. 2 mL 6   DULoxetine (CYMBALTA) 30 MG capsule Take 1 capsule (30 mg total) by mouth daily. Take 1 tablet (30 mg) twice a day. 90 capsule 1   glucose blood (ACCU-CHEK GUIDE) test strip USE TO test THREE TIMES DAILY 100 strip 6  levothyroxine (SYNTHROID) 50 MCG tablet Take 1 tablet (50 mcg total) by mouth every morning. 90 tablet 1   lisinopril (ZESTRIL) 20 MG tablet Take 1 tablet (20 mg total) by mouth daily. 90 tablet 0   omeprazole (PRILOSEC) 20 MG capsule TAKE ONE CAPSULE BY MOUTH EVERY DAY 90 capsule 0   propranolol (INDERAL) 80 MG tablet Take 1 tablet (80 mg total) by mouth 2 (two) times daily. 180 tablet 0   No current facility-administered medications on file prior to visit.    Allergies  Allergen Reactions   Iron Diarrhea   Metformin  And Related Nausea And Vomiting   Penicillins Nausea And Vomiting    Has patient had a PCN reaction causing immediate rash, facial/tongue/throat swelling, SOB or lightheadedness with hypotension: No Has patient had a PCN reaction causing severe rash involving mucus membranes or skin necrosis: No Has patient had a PCN reaction that required hospitalization No Has patient had a PCN reaction occurring within the last 10 years: No If all of the above answers are "NO", then may proceed with Cephalosporin use.    Social History   Socioeconomic History   Marital status: Single    Spouse name: Not on file   Number of children: 1   Years of education: 12   Highest education level: 12th grade  Occupational History   Occupation: Unemployed  Tobacco Use   Smoking status: Never   Smokeless tobacco: Never  Substance and Sexual Activity   Alcohol use: No    Alcohol/week: 0.0 standard drinks of alcohol   Drug use: No   Sexual activity: Not Currently    Partners: Male  Other Topics Concern   Not on file  Social History Narrative   Lives with son, Patricia Hua.   Caffeine use: Daily       Left handed   Social Determinants of Health   Financial Resource Strain: Low Risk  (02/12/2023)   Overall Financial Resource Strain (CARDIA)    Difficulty of Paying Living Expenses: Not hard at all  Food Insecurity: Food Insecurity Present (02/12/2023)   Hunger Vital Sign    Worried About Running Out of Food in the Last Year: Sometimes true    Ran Out of Food in the Last Year: Sometimes true  Transportation Needs: No Transportation Needs (02/12/2023)   PRAPARE - Administrator, Civil Service (Medical): No    Lack of Transportation (Non-Medical): No  Physical Activity: Unknown (02/12/2023)   Exercise Vital Sign    Days of Exercise per Week: 2 days    Minutes of Exercise per Session: Patient declined  Stress: Stress Concern Present (02/12/2023)   [employer] of Occupational Health -  Occupational Stress Questionnaire    Feeling of Stress : To some extent  Social Connections: Unknown (02/12/2023)   Social Connection and Isolation Panel [NHANES]    Frequency of Communication with Friends and Family: Twice a week    Frequency of Social Gatherings with Friends and Family: Never    Attends Religious Services: Never    Diplomatic Services operational officer: No    Attends Engineer, structural: Not on file    Marital Status: Patient declined  Catering manager Violence: Not on file    Family History  Problem Relation Age of Onset   Headache Neg Hx    Colon cancer Neg Hx    Esophageal cancer Neg Hx    Rectal cancer Neg Hx    Stomach cancer Neg Hx  Breast cancer Neg Hx     Past Surgical History:  Procedure Laterality Date   BREAST CYST EXCISION Right 1980   BREAST CYST EXCISION Left 1985   NO PAST SURGERIES      ROS: Review of Systems Negative except as stated above  PHYSICAL EXAM: BP 111/72 (BP Location: Left Arm, Patient Position: Sitting, Cuff Size: Normal)   Pulse 73   Temp 98 F (36.7 C) (Oral)   Ht 5\' 7"  (1.702 m)   Wt 184 lb (83.5 kg)   LMP 06/06/2015   SpO2 97%   BMI 28.82 kg/m   Wt Readings from Last 3 Encounters:  02/13/23 184 lb (83.5 kg)  01/16/23 181 lb (82.1 kg)  12/30/22 181 lb (82.1 kg)    Physical Exam  General appearance - alert, well appearing, older Caucasian female and in no distress Mental status -quiet demeanor and flat affect. Eyes -Pink conjunctiva. Nose - normal and patent, no erythema, discharge or polyps Mouth - mucous membranes moist, pharynx normal without lesions Neck -no cervical lymphadenopathy. Chest - clear to auscultation, no wheezes, rales or rhonchi, symmetric air entry Heart - normal rate, regular rhythm, normal S1, S2, no murmurs, rubs, clicks or gallops Extremities - peripheral pulses normal, no pedal edema, no clubbing or cyanosis      Latest Ref Rng & Units 12/30/2022    4:35 PM  09/30/2022    2:55 PM 05/20/2022   11:48 AM  CMP  Glucose 70 - 99 mg/dL 604  540  981   BUN 8 - 23 mg/dL 17  14  13    Creatinine 0.44 - 1.00 mg/dL 1.91  4.78  2.95   Sodium 135 - 145 mmol/L 138  136  138   Potassium 3.5 - 5.1 mmol/L 4.4  4.1  4.6   Chloride 98 - 111 mmol/L 102  103  101   CO2 22 - 32 mmol/L 24  23  22    Calcium 8.9 - 10.3 mg/dL 9.6  9.3  9.7   Total Protein 6.5 - 8.1 g/dL 8.6  8.2  8.6   Total Bilirubin 0.3 - 1.2 mg/dL 0.7  0.9  0.4   Alkaline Phos 38 - 126 U/L 114  82  131   AST 15 - 41 U/L 24  22  14    ALT 0 - 44 U/L 27  29  18     Lipid Panel     Component Value Date/Time   CHOL 150 05/20/2022 1148   TRIG 139 05/20/2022 1148   HDL 34 (L) 05/20/2022 1148   CHOLHDL 4.4 05/20/2022 1148   CHOLHDL 9.2 (H) 09/28/2015 1420   VLDL 61 (H) 09/28/2015 1420   LDLCALC 91 05/20/2022 1148    CBC    Component Value Date/Time   WBC 14.9 (H) 12/30/2022 1635   RBC 5.08 12/30/2022 1635   HGB 16.1 (H) 12/30/2022 1635   HGB 15.7 08/28/2021 0931   HCT 49.0 (H) 12/30/2022 1635   HCT 44.8 08/28/2021 0931   PLT 392 12/30/2022 1635   PLT 302 08/28/2021 0931   MCV 96.5 12/30/2022 1635   MCV 91 08/28/2021 0931   MCH 31.7 12/30/2022 1635   MCHC 32.9 12/30/2022 1635   RDW 12.5 12/30/2022 1635   RDW 12.3 08/28/2021 0931   LYMPHSABS 2.7 12/30/2022 1635   LYMPHSABS 2.4 02/17/2017 1141   MONOABS 0.9 12/30/2022 1635   EOSABS 0.1 12/30/2022 1635   EOSABS 0.1 02/17/2017 1141   BASOSABS 0.1 12/30/2022 1635  BASOSABS 0.1 02/17/2017 1141   Most recent EKG done 12/2022 reviewed in the system.  ASSESSMENT AND PLAN: 1. Preoperative evaluation to rule out surgical contraindication Patient is optimally maximized and is at acceptable risk for the planned procedure.  We will recheck her A1c today to make sure diabetes is still under good control and repeat CBC as well.  2. Type 2 diabetes mellitus with hyperlipidemia Blood sugars at goal. Told to hold Trulicity for a full 7 days  prior to her procedure to help prevent aspiration perioperatively.  We will bridge with low-dose Lantus insulin at bedtime after a skip dose of Trulicity to make sure that her blood sugars stay good in preparation for surgery.  Once she has had her surgery, she can restart the Trulicity once oral intake is reestablished.  Stop the Lantus insulin once she has restarted Trulicity. - CBC With Differential - Hemoglobin A1c - insulin glargine (LANTUS SOLOSTAR) 100 UNIT/ML Solostar Pen; Inject 5 Units into the skin at bedtime.  Dispense: 3 mL; Refill: 0  3. Hypertension associated with type 2 diabetes mellitus At goal.  Continue Norvasc and lisinopril  4. Hypothyroidism (acquired) Recent TSH at goal.  Continue levothyroxine    Patient was given the opportunity to ask questions.  Patient verbalized understanding of the plan and was able to repeat key elements of the plan.   This documentation was completed using Paediatric nurse.  Any transcriptional errors are unintentional.  No orders of the defined types were placed in this encounter.    Requested Prescriptions    No prescriptions requested or ordered in this encounter    No follow-ups on file.  Jonah Blue, MD, FACP

## 2023-02-14 LAB — HEMOGLOBIN A1C
Est. average glucose Bld gHb Est-mCnc: 143 mg/dL
Hgb A1c MFr Bld: 6.6 % — ABNORMAL HIGH (ref 4.8–5.6)

## 2023-02-14 LAB — CBC WITH DIFFERENTIAL
Basophils Absolute: 0.1 10*3/uL (ref 0.0–0.2)
Basos: 1 %
EOS (ABSOLUTE): 0.1 10*3/uL (ref 0.0–0.4)
Eos: 1 %
Hematocrit: 43.8 % (ref 34.0–46.6)
Hemoglobin: 15.1 g/dL (ref 11.1–15.9)
Immature Grans (Abs): 0 10*3/uL (ref 0.0–0.1)
Immature Granulocytes: 0 %
Lymphocytes Absolute: 3.7 10*3/uL — ABNORMAL HIGH (ref 0.7–3.1)
Lymphs: 41 %
MCH: 31.9 pg (ref 26.6–33.0)
MCHC: 34.5 g/dL (ref 31.5–35.7)
MCV: 93 fL (ref 79–97)
Monocytes Absolute: 0.7 10*3/uL (ref 0.1–0.9)
Monocytes: 8 %
Neutrophils Absolute: 4.5 10*3/uL (ref 1.4–7.0)
Neutrophils: 49 %
RBC: 4.73 x10E6/uL (ref 3.77–5.28)
RDW: 13.5 % (ref 11.7–15.4)
WBC: 9.2 10*3/uL (ref 3.4–10.8)

## 2023-02-14 NOTE — Telephone Encounter (Signed)
Patient was contacted within 30 minutes of her leaving a message. Surgery has been scheduled for 03/11/23.

## 2023-03-03 NOTE — Pre-Procedure Instructions (Signed)
Surgical Instructions    Your procedure is scheduled on Tuesday, May 21.  Report to [facility] Main Entrance "A" at 5:30 A.M., then check in with the Admitting office.  Call this number if you have problems the morning of surgery:  [phone]   If you have any questions prior to your surgery date call [phone]: Open Monday-Friday 8am-4pm If you experience any cold or flu symptoms such as cough, fever, chills, shortness of breath, etc. between now and your scheduled surgery, please notify us at the above number     Remember:  Do not eat after midnight the night before your surgery  You may drink clear liquids until 4:30AM the morning of your surgery.   Clear liquids allowed are: Water, Non-Citrus Juices (without pulp), Carbonated Beverages, Clear Tea, Black Coffee ONLY (NO MILK, CREAM OR POWDERED CREAMER of any kind), and Gatorade    Take these medicines the morning of surgery with A SIP OF WATER:  amLODipine (NORVASC)  atorvastatin (LIPITOR)  busPIRone (BUSPAR) DULoxetine (CYMBALTA)  levothyroxine (SYNTHROID)  omeprazole (PRILOSEC)  propranolol (INDERAL)   If needed: acetaminophen (TYLENOL)   As of today, STOP taking any Aspirin (unless otherwise instructed by your surgeon) Aleve, Naproxen, Ibuprofen, Motrin, Advil, Goody's, BC's, all herbal medications, fish oil, and all vitamins.   WHAT DO I DO ABOUT MY DIABETES MEDICATION?   Do not take oral diabetes medicines (pills) the morning of surgery.  THE NIGHT BEFORE SURGERY, take 2 units (half dose) of  insulin glargine (Lantus) insulin.      DO NOT TAKE Trulicity for 7 days prior to surgery. DO NOT take after 03/03/23.   The day of surgery, do not take other diabetes injectables, including Byetta (exenatide), Bydureon (exenatide ER), Victoza (liraglutide), or Trulicity (dulaglutide).  If your CBG is greater than 220 mg/dL, you may take  of your sliding scale (correction) dose of insulin.   HOW TO MANAGE YOUR  DIABETES BEFORE AND AFTER SURGERY  Why is it important to control my blood sugar before and after surgery? Improving blood sugar levels before and after surgery helps healing and can limit problems. A way of improving blood sugar control is eating a healthy diet by:  Eating less sugar and carbohydrates  Increasing activity/exercise  Talking with your doctor about reaching your blood sugar goals High blood sugars (greater than 180 mg/dL) can raise your risk of infections and slow your recovery, so you will need to focus on controlling your diabetes during the weeks before surgery. Make sure that the doctor who takes care of your diabetes knows about your planned surgery including the date and location.  How do I manage my blood sugar before surgery? Check your blood sugar at least 4 times a day, starting 2 days before surgery, to make sure that the level is not too high or low.  Check your blood sugar the morning of your surgery when you wake up and every 2 hours until you get to the Short Stay unit.  If your blood sugar is less than 70 mg/dL, you will need to treat for low blood sugar: Do not take insulin. Treat a low blood sugar (less than 70 mg/dL) with  cup of clear juice (cranberry or apple), 4 glucose tablets, OR glucose gel. Recheck blood sugar in 15 minutes after treatment (to make sure it is greater than 70 mg/dL). If your blood sugar is not greater than 70 mg/dL on recheck, call [phone] for further instructions. Report your blood sugar to the  short stay nurse when you get to Short Stay.  If you are admitted to the hospital after surgery: Your blood sugar will be checked by the staff and you will probably be given insulin after surgery (instead of oral diabetes medicines) to make sure you have good blood sugar levels. The goal for blood sugar control after surgery is 80-180 mg/dL.   [facility] is not responsible for any belongings or valuables.    Do NOT Smoke  (Tobacco/Vaping)  24 hours prior to your procedure  If you use a CPAP at night, you may bring your mask for your overnight stay.   Contacts, glasses, hearing aids, dentures or partials may not be worn into surgery, please bring cases for these belongings   For patients admitted to the hospital, discharge time will be determined by your treatment team.   Patients discharged the day of surgery will not be allowed to drive home, and someone needs to stay with them for 24 hours.   SURGICAL WAITING ROOM VISITATION Patients having surgery or a procedure may have no more than 2 support people in the waiting area - these visitors may rotate.   Children under the age of 74 must have an adult with them who is not the patient. If the patient needs to stay at the hospital during part of their recovery, the visitor guidelines for inpatient rooms apply. Pre-op nurse will coordinate an appropriate time for 1 support person to accompany patient in pre-op.  This support person may not rotate.   Please refer to [email or web address] for the visitor guidelines for Inpatients (after your surgery is over and you are in a regular room).    Special instructions:    Oral Hygiene is also important to reduce your risk of infection.  Remember - BRUSH YOUR TEETH THE MORNING OF SURGERY WITH YOUR REGULAR TOOTHPASTE    Pre-operative 5 CHG Bath Instructions   You can play a key role in reducing the risk of infection after surgery. Your skin needs to be as free of germs as possible. You can reduce the number of germs on your skin by washing with CHG (chlorhexidine gluconate) soap before surgery. CHG is an antiseptic soap that kills germs and continues to kill germs even after washing.   DO NOT use if you have an allergy to chlorhexidine/CHG or antibacterial soaps. If your skin becomes reddened or irritated, stop using the CHG and notify one of our RNs at [phone].    Please shower with the CHG soap starting 4 days before surgery using the following schedule:     Please keep in mind the following:  DO NOT shave, including legs and underarms, starting the day of your first shower.   You may shave your face at any point before/day of surgery.  Place clean sheets on your bed the day you start using CHG soap. Use a clean washcloth (not used since being washed) for each shower. DO NOT sleep with pets once you start using the CHG.   CHG Shower Instructions:  If you choose to wash your hair and private area, wash first with your normal shampoo/soap.  After you use shampoo/soap, rinse your hair and body thoroughly to remove shampoo/soap residue.  Turn the water OFF and apply about 3 tablespoons (45 ml) of CHG soap to a CLEAN washcloth.  Apply CHG soap ONLY FROM YOUR NECK DOWN TO YOUR TOES (washing for 3-5 minutes)  DO NOT use CHG soap on face, private areas, open wounds, or  sores.  Pay special attention to the area where your surgery is being performed.  If you are having back surgery, having someone wash your back for you may be helpful. Wait 2 minutes after CHG soap is applied, then you may rinse off the CHG soap.  Pat dry with a clean towel  Put on clean clothes/pajamas   If you choose to wear lotion, please use ONLY the CHG-compatible lotions on the back of this paper.     Additional instructions for the day of surgery: DO NOT APPLY any lotions, deodorants, cologne, or perfumes.   Put on clean/comfortable clothes.  Brush your teeth.  Ask your nurse before applying any prescription medications to the skin.      CHG Compatible Lotions   Aveeno Moisturizing lotion  Cetaphil Moisturizing Cream  Cetaphil Moisturizing Lotion  Clairol Herbal Essence Moisturizing Lotion, Dry Skin  Clairol Herbal Essence Moisturizing Lotion, Extra Dry Skin  Clairol Herbal Essence Moisturizing Lotion, Normal Skin  Curel Age Defying Therapeutic Moisturizing Lotion  with Alpha Hydroxy  Curel Extreme Care Body Lotion  Curel Soothing Hands Moisturizing Hand Lotion  Curel Therapeutic Moisturizing Cream, Fragrance-Free  Curel Therapeutic Moisturizing Lotion, Fragrance-Free  Curel Therapeutic Moisturizing Lotion, Original Formula  Eucerin Daily Replenishing Lotion  Eucerin Dry Skin Therapy Plus Alpha Hydroxy Crme  Eucerin Dry Skin Therapy Plus Alpha Hydroxy Lotion  Eucerin Original Crme  Eucerin Original Lotion  Eucerin Plus Crme Eucerin Plus Lotion  Eucerin TriLipid Replenishing Lotion  Keri Anti-Bacterial Hand Lotion  Keri Deep Conditioning Original Lotion Dry Skin Formula Softly Scented  Keri Deep Conditioning Original Lotion, Fragrance Free Sensitive Skin Formula  Keri Lotion Fast Absorbing Fragrance Free Sensitive Skin Formula  Keri Lotion Fast Absorbing Softly Scented Dry Skin Formula  Keri Original Lotion  Keri Skin Renewal Lotion Keri Silky Smooth Lotion  Keri Silky Smooth Sensitive Skin Lotion  Nivea Body Creamy Conditioning Oil  Nivea Body Extra Enriched Lotion  Nivea Body Original Lotion  Nivea Body Sheer Moisturizing Lotion Nivea Crme  Nivea Skin Firming Lotion  NutraDerm 30 Skin Lotion  NutraDerm Skin Lotion  NutraDerm Therapeutic Skin Cream  NutraDerm Therapeutic Skin Lotion  ProShield Protective Hand Cream  Provon moisturizing lotion   Day of Surgery:  Wear Clean/Comfortable clothing the morning of surgery Do not wear jewelry or makeup. Do not wear lotions, powders, perfumes/cologne or deodorant. Do not shave 48 hours prior to surgery.  Men may shave face and neck. Do not bring valuables to the hospital. Do not wear nail polish, gel polish, artificial nails, or any other type of covering on natural nails (fingers and toes) If you have artificial nails or gel coating that need to be removed by a nail salon, please have this removed prior to surgery. Artificial nails or gel coating may interfere with anesthesia's ability to  adequately monitor your vital signs. Remember to brush your teeth WITH YOUR REGULAR TOOTHPASTE.    If you received a COVID test during your pre-op visit, it is requested that you wear a mask when out in public, stay away from anyone that may not be feeling well, and notify your surgeon if you develop symptoms. If you have been in contact with anyone that has tested positive in the last 10 days, please notify your surgeon.    Please read over the following fact sheets that you were given.

## 2023-03-04 ENCOUNTER — Encounter (HOSPITAL_COMMUNITY)
Admission: RE | Admit: 2023-03-04 | Discharge: 2023-03-04 | Disposition: A | Discharge status: Home / Self Care | Payer: 59 | Source: Ambulatory Visit | Attending: Orthopedic Surgery | Admitting: Orthopedic Surgery

## 2023-03-04 ENCOUNTER — Other Ambulatory Visit: Payer: Self-pay

## 2023-03-04 ENCOUNTER — Encounter (HOSPITAL_COMMUNITY): Payer: Self-pay

## 2023-03-04 VITALS — BP 120/83 | HR 93 | Temp 98.4°F | Resp 17 | Ht 67.0 in | Wt 185.7 lb

## 2023-03-04 DIAGNOSIS — Z01818 Encounter for other preprocedural examination: Secondary | ICD-10-CM

## 2023-03-04 DIAGNOSIS — G549 Nerve root and plexus disorder, unspecified: Secondary | ICD-10-CM | POA: Diagnosis not present

## 2023-03-04 DIAGNOSIS — Z01812 Encounter for preprocedural laboratory examination: Secondary | ICD-10-CM | POA: Insufficient documentation

## 2023-03-04 HISTORY — DX: Type 2 diabetes mellitus without complications: E11.9

## 2023-03-04 LAB — TYPE AND SCREEN
ABO/RH(D): O POS
Antibody Screen: NEGATIVE

## 2023-03-04 LAB — CBC
HCT: 42.6 % (ref 36.0–46.0)
Hemoglobin: 14.3 g/dL (ref 12.0–15.0)
MCH: 31.5 pg (ref 26.0–34.0)
MCHC: 33.6 g/dL (ref 30.0–36.0)
MCV: 93.8 fL (ref 80.0–100.0)
Platelets: 317 10*3/uL (ref 150–400)
RBC: 4.54 MIL/uL (ref 3.87–5.11)
RDW: 12.8 % (ref 11.5–15.5)
WBC: 8 10*3/uL (ref 4.0–10.5)
nRBC: 0 % (ref 0.0–0.2)

## 2023-03-04 LAB — BASIC METABOLIC PANEL
Anion gap: 10 (ref 5–15)
BUN: 12 mg/dL (ref 8–23)
CO2: 24 mmol/L (ref 22–32)
Calcium: 9.4 mg/dL (ref 8.9–10.3)
Chloride: 102 mmol/L (ref 98–111)
Creatinine, Ser: 0.77 mg/dL (ref 0.44–1.00)
GFR, Estimated: >60 mL/min (ref >60)
Glucose, Bld: 149 mg/dL — ABNORMAL HIGH (ref 70–99)
Potassium: 4.3 mmol/L (ref 3.5–5.1)
Sodium: 136 mmol/L (ref 135–145)

## 2023-03-04 LAB — SURGICAL PCR SCREEN
MRSA, PCR: NEGATIVE
Staphylococcus aureus: POSITIVE — AB

## 2023-03-04 LAB — GLUCOSE, CAPILLARY: Glucose-Capillary: 157 mg/dL — ABNORMAL HIGH (ref 70–99)

## 2023-03-04 NOTE — Progress Notes (Signed)
PCP - Dr. Jonah Blue Cardiologist - Denies  PPM/ICD - Denies  Chest x-ray - na EKG - 12/30/2022 Stress Test -  ECHO -  Cardiac Cath -   Sleep Study - Had a sleep study, was not diagnosed with sleep apnea CPAP - None  Type II diabetic Fasting Blood Sugar - 130-150  Checks Blood Sugar three times a day  Last dose of GLP1 agonist-  Trulicity,  GLP1 instructions: Last dose 02/26/2023 Insulin glargine (Lantus) states has not started yet.   Blood Thinner Instructions:Denies Aspirin Instructions:Denies  ERAS Protcol -Yes, until 0430 PRE-SURGERY G2   COVID TEST- n/a  Anesthesia review: Yes. HTN, DM  Patient denies shortness of breath, fever, cough and chest pain at PAT appointment   All instructions explained to the patient, with a verbal understanding of the material. Patient agrees to go over the instructions while at home for a better understanding. Patient also instructed to self quarantine after being tested for COVID-19. The opportunity to ask questions was provided.

## 2023-03-10 NOTE — Anesthesia Preprocedure Evaluation (Addendum)
Anesthesia Evaluation  Patient identified by MRN, date of birth, ID band Patient awake    Reviewed: Allergy & Precautions, H&P , NPO status , Patient's Chart, lab work & pertinent test results  Airway Mallampati: II  TM Distance: >3 FB     Dental no notable dental hx. (+) Teeth Intact, Dental Advisory Given   Pulmonary sleep apnea and Continuous Positive Airway Pressure Ventilation    Pulmonary exam normal breath sounds clear to auscultation       Cardiovascular Exercise Tolerance: Good hypertension, Pt. on medications and Pt. on home beta blockers  Rhythm:Regular Rate:Normal     Neuro/Psych  Headaches  Anxiety Depression    negative neurological ROS     GI/Hepatic Neg liver ROS,GERD  Medicated,,  Endo/Other  diabetes, Type 2, Insulin Dependent, Oral Hypoglycemic AgentsHypothyroidism    Renal/GU negative Renal ROS  negative genitourinary   Musculoskeletal  (+) Arthritis , Osteoarthritis,    Abdominal   Peds  Hematology negative hematology ROS (+)   Anesthesia Other Findings   Reproductive/Obstetrics negative OB ROS                             Anesthesia Physical Anesthesia Plan  ASA: 3  Anesthesia Plan: General   Post-op Pain Management: Tylenol PO (pre-op)*   Induction: Intravenous  PONV Risk Score and Plan: 4 or greater and Ondansetron, Dexamethasone and Midazolam  Airway Management Planned: Oral ETT  Additional Equipment:   Intra-op Plan:   Post-operative Plan: Extubation in OR  Informed Consent: I have reviewed the patients History and Physical, chart, labs and discussed the procedure including the risks, benefits and alternatives for the proposed anesthesia with the patient or authorized representative who has indicated his/her understanding and acceptance.     Dental advisory given  Plan Discussed with: CRNA  Anesthesia Plan Comments:        Anesthesia  Quick Evaluation

## 2023-03-11 ENCOUNTER — Observation Stay (HOSPITAL_COMMUNITY): Payer: 59

## 2023-03-11 ENCOUNTER — Encounter (HOSPITAL_COMMUNITY)
Admission: RE | Disposition: A | Discharge status: Home / Self Care | Payer: Self-pay | Source: Home / Self Care | Attending: Orthopedic Surgery

## 2023-03-11 ENCOUNTER — Encounter (HOSPITAL_COMMUNITY): Payer: Self-pay | Admitting: Orthopedic Surgery

## 2023-03-11 ENCOUNTER — Other Ambulatory Visit: Payer: Self-pay

## 2023-03-11 ENCOUNTER — Observation Stay (HOSPITAL_COMMUNITY)
Admission: RE | Admit: 2023-03-11 | Discharge: 2023-03-12 | Disposition: A | Discharge status: Home / Self Care | Payer: 59 | Attending: Orthopedic Surgery | Admitting: Orthopedic Surgery

## 2023-03-11 ENCOUNTER — Ambulatory Visit (HOSPITAL_COMMUNITY): Payer: 59 | Admitting: Physician Assistant

## 2023-03-11 ENCOUNTER — Ambulatory Visit (HOSPITAL_BASED_OUTPATIENT_CLINIC_OR_DEPARTMENT_OTHER): Payer: 59 | Admitting: Anesthesiology

## 2023-03-11 ENCOUNTER — Ambulatory Visit (HOSPITAL_COMMUNITY): Payer: 59

## 2023-03-11 DIAGNOSIS — M50122 Cervical disc disorder at C5-C6 level with radiculopathy: Secondary | ICD-10-CM | POA: Insufficient documentation

## 2023-03-11 DIAGNOSIS — M50123 Cervical disc disorder at C6-C7 level with radiculopathy: Secondary | ICD-10-CM | POA: Diagnosis not present

## 2023-03-11 DIAGNOSIS — G549 Nerve root and plexus disorder, unspecified: Secondary | ICD-10-CM | POA: Diagnosis present

## 2023-03-11 DIAGNOSIS — G0491 Myelitis, unspecified: Secondary | ICD-10-CM

## 2023-03-11 DIAGNOSIS — M50023 Cervical disc disorder at C6-C7 level with myelopathy: Secondary | ICD-10-CM | POA: Diagnosis not present

## 2023-03-11 DIAGNOSIS — M2578 Osteophyte, vertebrae: Secondary | ICD-10-CM

## 2023-03-11 DIAGNOSIS — G4733 Obstructive sleep apnea (adult) (pediatric): Secondary | ICD-10-CM

## 2023-03-11 DIAGNOSIS — M50121 Cervical disc disorder at C4-C5 level with radiculopathy: Secondary | ICD-10-CM | POA: Diagnosis not present

## 2023-03-11 DIAGNOSIS — M50022 Cervical disc disorder at C5-C6 level with myelopathy: Secondary | ICD-10-CM | POA: Diagnosis not present

## 2023-03-11 DIAGNOSIS — M50021 Cervical disc disorder at C4-C5 level with myelopathy: Principal | ICD-10-CM | POA: Insufficient documentation

## 2023-03-11 DIAGNOSIS — I10 Essential (primary) hypertension: Secondary | ICD-10-CM | POA: Insufficient documentation

## 2023-03-11 DIAGNOSIS — R7309 Other abnormal glucose: Secondary | ICD-10-CM | POA: Diagnosis not present

## 2023-03-11 DIAGNOSIS — Z9989 Dependence on other enabling machines and devices: Secondary | ICD-10-CM | POA: Diagnosis not present

## 2023-03-11 HISTORY — PX: ANTERIOR CERVICAL DECOMP/DISCECTOMY FUSION: SHX1161

## 2023-03-11 LAB — GLUCOSE, CAPILLARY
Glucose-Capillary: 150 mg/dL — ABNORMAL HIGH (ref 70–99)
Glucose-Capillary: 177 mg/dL — ABNORMAL HIGH (ref 70–99)
Glucose-Capillary: 175 mg/dL — ABNORMAL HIGH (ref 70–99)
Glucose-Capillary: 214 mg/dL — ABNORMAL HIGH (ref 70–99)

## 2023-03-11 SURGERY — ANTERIOR CERVICAL DECOMPRESSION/DISCECTOMY FUSION 3 LEVELS
Anesthesia: General

## 2023-03-11 MED ORDER — CEFAZOLIN SODIUM 1 G IJ SOLR
INTRAMUSCULAR | Status: AC
  Filled 2023-03-11: qty 20

## 2023-03-11 MED ORDER — PROPOFOL 10 MG/ML IV BOLUS
INTRAVENOUS | Status: AC
  Filled 2023-03-11: qty 20

## 2023-03-11 MED ORDER — POLYETHYLENE GLYCOL 3350 17 G PO PACK
17.0000 g | PACK | Freq: Two times a day (BID) | ORAL | Status: DC
  Administered 2023-03-11: 17 g via ORAL
  Filled 2023-03-11: qty 1

## 2023-03-11 MED ORDER — PROPRANOLOL HCL 10 MG PO TABS
80.0000 mg | ORAL_TABLET | Freq: Two times a day (BID) | ORAL | Status: DC
  Administered 2023-03-11: 80 mg via ORAL
  Filled 2023-03-11: qty 8

## 2023-03-11 MED ORDER — HYDROMORPHONE HCL 1 MG/ML IJ SOLN
0.5000 mg | INTRAMUSCULAR | Status: DC | PRN
  Administered 2023-03-11: 1 mg via INTRAVENOUS
  Filled 2023-03-11: qty 1

## 2023-03-11 MED ORDER — EPHEDRINE SULFATE-NACL 50-0.9 MG/10ML-% IV SOSY
PREFILLED_SYRINGE | INTRAVENOUS | Status: DC | PRN
  Administered 2023-03-11 (×2): 10 mg via INTRAVENOUS
  Administered 2023-03-11: 15 mg via INTRAVENOUS
  Administered 2023-03-11: 10 mg via INTRAVENOUS

## 2023-03-11 MED ORDER — EPHEDRINE 5 MG/ML INJ
INTRAVENOUS | Status: AC
  Filled 2023-03-11: qty 5

## 2023-03-11 MED ORDER — ONDANSETRON HCL 4 MG/2ML IJ SOLN
INTRAMUSCULAR | Status: DC | PRN
  Administered 2023-03-11: 4 mg via INTRAVENOUS

## 2023-03-11 MED ORDER — ACETAMINOPHEN 500 MG PO TABS
1000.0000 mg | ORAL_TABLET | Freq: Once | ORAL | Status: DC
  Filled 2023-03-11: qty 2

## 2023-03-11 MED ORDER — METHOCARBAMOL 500 MG PO TABS
500.0000 mg | ORAL_TABLET | Freq: Four times a day (QID) | ORAL | Status: DC
  Administered 2023-03-11 – 2023-03-12 (×3): 500 mg via ORAL
  Filled 2023-03-11 (×3): qty 1

## 2023-03-11 MED ORDER — FENTANYL CITRATE (PF) 250 MCG/5ML IJ SOLN
INTRAMUSCULAR | Status: AC
  Filled 2023-03-11: qty 5

## 2023-03-11 MED ORDER — AMITRIPTYLINE HCL 50 MG PO TABS: 50.0000 mg | ORAL_TABLET | Freq: Every day | ORAL | Status: DC

## 2023-03-11 MED ORDER — DEXAMETHASONE SODIUM PHOSPHATE 10 MG/ML IJ SOLN
INTRAMUSCULAR | Status: AC
  Filled 2023-03-11: qty 1

## 2023-03-11 MED ORDER — PANTOPRAZOLE SODIUM 40 MG PO TBEC: 40.0000 mg | DELAYED_RELEASE_TABLET | Freq: Every day | ORAL | Status: DC

## 2023-03-11 MED ORDER — DULOXETINE HCL 30 MG PO CPEP
30.0000 mg | ORAL_CAPSULE | Freq: Two times a day (BID) | ORAL | Status: DC
  Administered 2023-03-11: 30 mg via ORAL
  Filled 2023-03-11: qty 1

## 2023-03-11 MED ORDER — LISINOPRIL 20 MG PO TABS: 20.0000 mg | ORAL_TABLET | Freq: Every day | ORAL | Status: DC

## 2023-03-11 MED ORDER — PHENYLEPHRINE 80 MCG/ML (10ML) SYRINGE FOR IV PUSH (FOR BLOOD PRESSURE SUPPORT)
PREFILLED_SYRINGE | INTRAVENOUS | Status: AC
  Filled 2023-03-11: qty 10

## 2023-03-11 MED ORDER — CEFAZOLIN SODIUM-DEXTROSE 2-4 GM/100ML-% IV SOLN
2.0000 g | Freq: Four times a day (QID) | INTRAVENOUS | Status: AC
  Administered 2023-03-11 – 2023-03-12 (×3): 2 g via INTRAVENOUS
  Filled 2023-03-11 (×3): qty 100

## 2023-03-11 MED ORDER — INSULIN ASPART 100 UNIT/ML IJ SOLN
0.0000 [IU] | Freq: Every day | INTRAMUSCULAR | Status: DC
  Administered 2023-03-11: 2 [IU] via SUBCUTANEOUS

## 2023-03-11 MED ORDER — HYDROMORPHONE HCL 1 MG/ML IJ SOLN
INTRAMUSCULAR | Status: AC
  Filled 2023-03-11: qty 0.5

## 2023-03-11 MED ORDER — TRANEXAMIC ACID-NACL 1000-0.7 MG/100ML-% IV SOLN
1000.0000 mg | Freq: Once | INTRAVENOUS | Status: AC
  Administered 2023-03-11: 1000 mg via INTRAVENOUS
  Filled 2023-03-11: qty 100

## 2023-03-11 MED ORDER — 0.9 % SODIUM CHLORIDE (POUR BTL) OPTIME
TOPICAL | Status: DC | PRN
  Administered 2023-03-11: 1000 mL

## 2023-03-11 MED ORDER — ONDANSETRON HCL 4 MG/2ML IJ SOLN
INTRAMUSCULAR | Status: AC
  Filled 2023-03-11: qty 2

## 2023-03-11 MED ORDER — SUCCINYLCHOLINE CHLORIDE 200 MG/10ML IV SOSY
PREFILLED_SYRINGE | INTRAVENOUS | Status: DC | PRN
  Administered 2023-03-11: 120 mg via INTRAVENOUS

## 2023-03-11 MED ORDER — MIDAZOLAM HCL 2 MG/2ML IJ SOLN
INTRAMUSCULAR | Status: DC | PRN
  Administered 2023-03-11: 2 mg via INTRAVENOUS

## 2023-03-11 MED ORDER — AMLODIPINE BESYLATE 10 MG PO TABS: 10.0000 mg | ORAL_TABLET | Freq: Every day | ORAL | Status: DC

## 2023-03-11 MED ORDER — HYDROMORPHONE HCL 1 MG/ML IJ SOLN: 0.2500 mg | INTRAMUSCULAR | Status: DC | PRN

## 2023-03-11 MED ORDER — INSULIN ASPART 100 UNIT/ML IJ SOLN: 0.0000 [IU] | INTRAMUSCULAR | Status: DC | PRN

## 2023-03-11 MED ORDER — CHLORHEXIDINE GLUCONATE 0.12 % MT SOLN
15.0000 mL | Freq: Once | OROMUCOSAL | Status: AC
  Administered 2023-03-11: 15 mL via OROMUCOSAL

## 2023-03-11 MED ORDER — POVIDONE-IODINE 10 % EX SWAB
2.0000 | Freq: Once | CUTANEOUS | Status: AC
  Administered 2023-03-11: 2 via TOPICAL

## 2023-03-11 MED ORDER — ORAL CARE MOUTH RINSE: 15.0000 mL | Freq: Once | OROMUCOSAL | Status: AC

## 2023-03-11 MED ORDER — HYDROMORPHONE HCL 1 MG/ML IJ SOLN
INTRAMUSCULAR | Status: DC | PRN
  Administered 2023-03-11 (×2): .5 mg via INTRAVENOUS

## 2023-03-11 MED ORDER — ATORVASTATIN CALCIUM 80 MG PO TABS: 80.0000 mg | ORAL_TABLET | Freq: Every day | ORAL | Status: DC

## 2023-03-11 MED ORDER — ACETAMINOPHEN 10 MG/ML IV SOLN
INTRAVENOUS | Status: DC | PRN
  Administered 2023-03-11: 1000 mg via INTRAVENOUS

## 2023-03-11 MED ORDER — LEVOTHYROXINE SODIUM 25 MCG PO TABS
50.0000 ug | ORAL_TABLET | Freq: Every day | ORAL | Status: DC
  Administered 2023-03-12: 50 ug via ORAL
  Filled 2023-03-11: qty 2

## 2023-03-11 MED ORDER — BUSPIRONE HCL 10 MG PO TABS
10.0000 mg | ORAL_TABLET | Freq: Three times a day (TID) | ORAL | Status: DC
  Administered 2023-03-11 (×2): 10 mg via ORAL
  Filled 2023-03-11 (×2): qty 1

## 2023-03-11 MED ORDER — ACETAMINOPHEN 10 MG/ML IV SOLN
INTRAVENOUS | Status: AC
  Filled 2023-03-11: qty 100

## 2023-03-11 MED ORDER — ACETAMINOPHEN 500 MG PO TABS
1000.0000 mg | ORAL_TABLET | Freq: Three times a day (TID) | ORAL | Status: DC
  Administered 2023-03-11 – 2023-03-12 (×3): 1000 mg via ORAL
  Filled 2023-03-11 (×3): qty 2

## 2023-03-11 MED ORDER — PHENYLEPHRINE 80 MCG/ML (10ML) SYRINGE FOR IV PUSH (FOR BLOOD PRESSURE SUPPORT)
PREFILLED_SYRINGE | INTRAVENOUS | Status: DC | PRN
  Administered 2023-03-11: 160 ug via INTRAVENOUS
  Administered 2023-03-11: 80 ug via INTRAVENOUS

## 2023-03-11 MED ORDER — SUCCINYLCHOLINE CHLORIDE 200 MG/10ML IV SOSY
PREFILLED_SYRINGE | INTRAVENOUS | Status: AC
  Filled 2023-03-11: qty 10

## 2023-03-11 MED ORDER — PROPOFOL 500 MG/50ML IV EMUL
INTRAVENOUS | Status: DC | PRN
  Administered 2023-03-11: 125 ug/kg/min via INTRAVENOUS

## 2023-03-11 MED ORDER — DOCUSATE SODIUM 100 MG PO CAPS
100.0000 mg | ORAL_CAPSULE | Freq: Two times a day (BID) | ORAL | Status: DC
  Administered 2023-03-11: 100 mg via ORAL
  Filled 2023-03-11: qty 1

## 2023-03-11 MED ORDER — DEXAMETHASONE SODIUM PHOSPHATE 10 MG/ML IJ SOLN
10.0000 mg | Freq: Once | INTRAMUSCULAR | Status: AC
  Administered 2023-03-11: 10 mg via INTRAVENOUS
  Filled 2023-03-11: qty 1

## 2023-03-11 MED ORDER — TRANEXAMIC ACID-NACL 1000-0.7 MG/100ML-% IV SOLN
1000.0000 mg | INTRAVENOUS | Status: AC
  Administered 2023-03-11: 1000 mg via INTRAVENOUS
  Filled 2023-03-11: qty 100

## 2023-03-11 MED ORDER — CEFAZOLIN SODIUM-DEXTROSE 2-4 GM/100ML-% IV SOLN
2.0000 g | INTRAVENOUS | Status: AC
  Administered 2023-03-11 (×2): 2 g via INTRAVENOUS
  Filled 2023-03-11: qty 100

## 2023-03-11 MED ORDER — OXYCODONE HCL 5 MG PO TABS
5.0000 mg | ORAL_TABLET | ORAL | Status: DC | PRN
  Administered 2023-03-11: 10 mg via ORAL
  Administered 2023-03-12: 5 mg via ORAL
  Filled 2023-03-11: qty 2
  Filled 2023-03-11: qty 1

## 2023-03-11 MED ORDER — PHENYLEPHRINE HCL-NACL 20-0.9 MG/250ML-% IV SOLN
INTRAVENOUS | Status: DC | PRN
  Administered 2023-03-11: 30 ug/min via INTRAVENOUS

## 2023-03-11 MED ORDER — THROMBIN 20000 UNITS EX KIT
PACK | CUTANEOUS | Status: DC | PRN
  Administered 2023-03-11: 20000 [IU] via TOPICAL

## 2023-03-11 MED ORDER — PHENOL 1.4 % MT LIQD
1.0000 | OROMUCOSAL | Status: DC | PRN
  Administered 2023-03-11: 1 via OROMUCOSAL
  Filled 2023-03-11: qty 177

## 2023-03-11 MED ORDER — DEXAMETHASONE SODIUM PHOSPHATE 10 MG/ML IJ SOLN
10.0000 mg | Freq: Three times a day (TID) | INTRAMUSCULAR | Status: AC
  Administered 2023-03-11 – 2023-03-12 (×3): 10 mg via INTRAVENOUS
  Filled 2023-03-11 (×4): qty 1

## 2023-03-11 MED ORDER — LACTATED RINGERS IV SOLN: INTRAVENOUS | Status: DC

## 2023-03-11 MED ORDER — LIDOCAINE 2% (20 MG/ML) 5 ML SYRINGE
INTRAMUSCULAR | Status: DC | PRN
  Administered 2023-03-11: 100 mg via INTRAVENOUS

## 2023-03-11 MED ORDER — INSULIN ASPART 100 UNIT/ML IJ SOLN
0.0000 [IU] | Freq: Three times a day (TID) | INTRAMUSCULAR | Status: DC
  Administered 2023-03-12: 3 [IU] via SUBCUTANEOUS

## 2023-03-11 MED ORDER — LIDOCAINE 2% (20 MG/ML) 5 ML SYRINGE
INTRAMUSCULAR | Status: AC
  Filled 2023-03-11: qty 5

## 2023-03-11 MED ORDER — ONDANSETRON HCL 4 MG/2ML IJ SOLN: 4.0000 mg | Freq: Four times a day (QID) | INTRAMUSCULAR | Status: DC | PRN

## 2023-03-11 MED ORDER — MIDAZOLAM HCL 2 MG/2ML IJ SOLN
INTRAMUSCULAR | Status: AC
  Filled 2023-03-11: qty 2

## 2023-03-11 MED ORDER — CHLORHEXIDINE GLUCONATE 0.12 % MT SOLN
15.0000 mL | Freq: Once | OROMUCOSAL | Status: AC
  Filled 2023-03-11: qty 15

## 2023-03-11 MED ORDER — MENTHOL 3 MG MT LOZG
1.0000 | LOZENGE | OROMUCOSAL | Status: DC | PRN
  Administered 2023-03-11: 3 mg via ORAL
  Filled 2023-03-11: qty 9

## 2023-03-11 MED ORDER — ONDANSETRON HCL 4 MG PO TABS: 4.0000 mg | ORAL_TABLET | Freq: Four times a day (QID) | ORAL | Status: DC | PRN

## 2023-03-11 MED ORDER — PROPOFOL 10 MG/ML IV BOLUS
INTRAVENOUS | Status: DC | PRN
  Administered 2023-03-11: 50 mg via INTRAVENOUS
  Administered 2023-03-11: 20 mg via INTRAVENOUS
  Administered 2023-03-11: 150 mg via INTRAVENOUS
  Administered 2023-03-11 (×2): 30 mg via INTRAVENOUS
  Administered 2023-03-11: 20 mg via INTRAVENOUS
  Administered 2023-03-11: 50 mg via INTRAVENOUS

## 2023-03-11 MED ORDER — THROMBIN 20000 UNITS EX KIT
PACK | CUTANEOUS | Status: AC
  Filled 2023-03-11: qty 1

## 2023-03-11 MED ORDER — FENTANYL CITRATE (PF) 250 MCG/5ML IJ SOLN
INTRAMUSCULAR | Status: DC | PRN
  Administered 2023-03-11: 100 ug via INTRAVENOUS
  Administered 2023-03-11 (×3): 50 ug via INTRAVENOUS

## 2023-03-11 SURGICAL SUPPLY — 76 items
AGENT HMST KT MTR STRL THRMB (HEMOSTASIS)
ALLOGRAFT TRIAD LORDOTIC CC (Bone Implant) IMPLANT
APL SKNCLS STERI-STRIP NONHPOA (GAUZE/BANDAGES/DRESSINGS) ×1
BAG COUNTER SPONGE SURGICOUNT (BAG) ×1 IMPLANT
BAG SPNG CNTER NS LX DISP (BAG) ×1
BAND INSRT 18 STRL LF DISP RB (MISCELLANEOUS)
BAND RUBBER #18 3X1/16 STRL (MISCELLANEOUS) ×2 IMPLANT
BENZOIN TINCTURE PRP APPL 2/3 (GAUZE/BANDAGES/DRESSINGS) IMPLANT
BLADE CLIPPER SURG (BLADE) IMPLANT
BUR MATCHSTICK NEURO 3.0 HP (BURR) ×1 IMPLANT
CABLE BIPOLOR RESECTION CORD (MISCELLANEOUS) ×1 IMPLANT
CANISTER SUCT 3000ML PPV (MISCELLANEOUS) ×1 IMPLANT
CLSR STERI-STRIP ANTIMIC 1/2X4 (GAUZE/BANDAGES/DRESSINGS) ×1 IMPLANT
COVER MAYO STAND STRL (DRAPES) ×3 IMPLANT
COVER SURGICAL LIGHT HANDLE (MISCELLANEOUS) ×2 IMPLANT
DRAIN CHANNEL 15F RND FF W/TCR (WOUND CARE) IMPLANT
DRAPE C-ARM 42X72 X-RAY (DRAPES) ×1 IMPLANT
DRAPE LAPAROTOMY 100X72X124 (DRAPES) ×1 IMPLANT
DRAPE MICROSCOPE LEICA (MISCELLANEOUS) ×1 IMPLANT
DRAPE MICROSCOPE NONGLARE (MISCELLANEOUS) IMPLANT
DRAPE POUCH INSTRU U-SHP 10X18 (DRAPES) ×1 IMPLANT
DRAPE SURG 17X23 STRL (DRAPES) ×1 IMPLANT
DRAPE U-SHAPE 47X51 STRL (DRAPES) ×1 IMPLANT
DRAPE UTILITY XL STRL (DRAPES) ×1 IMPLANT
DRSG OPSITE POSTOP 3X4 (GAUZE/BANDAGES/DRESSINGS) ×1 IMPLANT
DURAPREP 26ML APPLICATOR (WOUND CARE) ×1 IMPLANT
ELECT COATED BLADE 2.86 ST (ELECTRODE) ×1 IMPLANT
ELECT PENCIL ROCKER SW 15FT (MISCELLANEOUS) ×1 IMPLANT
ELECT REM PT RETURN 9FT ADLT (ELECTROSURGICAL) ×1
ELECTRODE REM PT RTRN 9FT ADLT (ELECTROSURGICAL) ×1 IMPLANT
FEE INTRAOP CADWELL SUPPLY NCS (MISCELLANEOUS) IMPLANT
FEE INTRAOP MONITOR IMPULS NCS (MISCELLANEOUS) IMPLANT
GAUZE SPONGE 4X4 12PLY STRL (GAUZE/BANDAGES/DRESSINGS) IMPLANT
GLOVE BIO SURGEON STRL SZ 6.5 (GLOVE) ×1 IMPLANT
GLOVE BIOGEL PI IND STRL 6.5 (GLOVE) ×1 IMPLANT
GLOVE BIOGEL PI IND STRL 8.5 (GLOVE) ×1 IMPLANT
GLOVE SS BIOGEL STRL SZ 8.5 (GLOVE) ×1 IMPLANT
GOWN STRL REUS W/ TWL LRG LVL3 (GOWN DISPOSABLE) ×1 IMPLANT
GOWN STRL REUS W/TWL 2XL LVL3 (GOWN DISPOSABLE) ×1 IMPLANT
GOWN STRL REUS W/TWL LRG LVL3 (GOWN DISPOSABLE) ×1
GOWN STRL SURGICAL XL XLNG (GOWN DISPOSABLE) IMPLANT
INTRAOP CADWELL SUPPLY FEE NCS (MISCELLANEOUS) ×1
INTRAOP DISP SUPPLY FEE NCS (MISCELLANEOUS) ×1
INTRAOP MONITOR FEE IMPULS NCS (MISCELLANEOUS) ×1
KIT BASIN OR (CUSTOM PROCEDURE TRAY) ×1 IMPLANT
KIT TURNOVER KIT B (KITS) ×1 IMPLANT
NDL SPNL 18GX3.5 QUINCKE PK (NEEDLE) ×1 IMPLANT
NEEDLE HYPO 22GX1.5 SAFETY (NEEDLE) ×1 IMPLANT
NEEDLE SPNL 18GX3.5 QUINCKE PK (NEEDLE) ×1 IMPLANT
NS IRRIG 1000ML POUR BTL (IV SOLUTION) ×1 IMPLANT
PACK ORTHO CERVICAL (CUSTOM PROCEDURE TRAY) ×1 IMPLANT
PACK UNIVERSAL I (CUSTOM PROCEDURE TRAY) ×1 IMPLANT
PAD ARMBOARD 7.5X6 YLW CONV (MISCELLANEOUS) ×2 IMPLANT
PATTIES SURGICAL .25X.25 (GAUZE/BANDAGES/DRESSINGS) IMPLANT
PENCIL BUTTON HOLSTER BLD 10FT (ELECTRODE) IMPLANT
PLATE ACP 1.9X56 3LVL (Screw) IMPLANT
POSITIONER HEAD DONUT 9IN (MISCELLANEOUS) ×1 IMPLANT
RESTRAINT LIMB HOLDER UNIV (RESTRAINTS) ×1 IMPLANT
SCREW ACP 3.5X17 S/D VARIA (Screw) IMPLANT
SCREW ACP VA SD 3.5X15 (Screw) IMPLANT
SPONGE INTESTINAL PEANUT (DISPOSABLE) ×1 IMPLANT
SPONGE SURGIFOAM ABS GEL SZ50 (HEMOSTASIS) ×1 IMPLANT
SUCTION FRAZIER TIP 8 FR DISP (SUCTIONS) ×1
SUCTION TUBE FRAZIER 8FR DISP (SUCTIONS) IMPLANT
SURGIFLO W/THROMBIN 8M KIT (HEMOSTASIS) IMPLANT
SUT BONE WAX W31G (SUTURE) ×1 IMPLANT
SUT MNCRL AB 3-0 PS2 27 (SUTURE) ×1 IMPLANT
SUT VIC AB 2-0 CT1 18 (SUTURE) ×1 IMPLANT
SYR BULB IRRIG 60ML STRL (SYRINGE) ×1 IMPLANT
TAPE CLOTH 4X10 WHT NS (GAUZE/BANDAGES/DRESSINGS) ×1 IMPLANT
TAPE CLOTH SURG 4X10 WHT LF (GAUZE/BANDAGES/DRESSINGS) IMPLANT
TOWEL GREEN STERILE (TOWEL DISPOSABLE) ×1 IMPLANT
TOWEL GREEN STERILE FF (TOWEL DISPOSABLE) ×1 IMPLANT
TRAY FOLEY W/BAG SLVR 14FR LF (SET/KITS/TRAYS/PACK) IMPLANT
TUBING FEATHERFLOW (TUBING) ×1 IMPLANT
WATER STERILE IRR 1000ML POUR (IV SOLUTION) ×1 IMPLANT

## 2023-03-11 NOTE — Anesthesia Procedure Notes (Signed)
Procedure Name: Intubation Date/Time: 03/11/2023 7:53 AM  Performed by: Orlin Hilding, CRNAPre-anesthesia Checklist: Patient identified, Emergency Drugs available, Suction available, Patient being monitored and Timeout performed Patient Re-evaluated:Patient Re-evaluated prior to induction Oxygen Delivery Method: Circle system utilized Preoxygenation: Pre-oxygenation with 100% oxygen Induction Type: IV induction Ventilation: Mask ventilation without difficulty and Oral airway inserted - appropriate to patient size Laryngoscope Size: Glidescope and 3 Grade View: Grade I Tube type: Oral Tube size: 7.0 mm Number of attempts: 1 Placement Confirmation: ETT inserted through vocal cords under direct vision, positive ETCO2 and breath sounds checked- equal and bilateral Secured at: 22 cm Tube secured with: Tape Dental Injury: Teeth and Oropharynx as per pre-operative assessment

## 2023-03-11 NOTE — Op Note (Signed)
Orthopedic Spine Surgery Operative Report  Procedure: C4-7 anterior cervical discectomy and fusion C4/5, C5/6, C6/7 placement of structural allograft interbody spacer C4, C5, C6, C7 anterior plate instrumentation  Modifier: none  Date of procedure: 03/11/2023  Patient name: [name] MRN: [id number] DOB: 16-Feb-1961  Surgeon: Willia Craze, MD Assistant: None Pre-operative diagnosis: cervical myeloradiculopathy Post-operative diagnosis: same as above Findings: Anterior osteophytes at C5/6 and C6/7 with disc height loss  Specimens: none Anesthesia: general EBL: 50cc Complications: none Pre-incision antibiotic: ancef Pre-incision decadron: 10mg   Implants:   C4/5: 6x14x1mm allograft interbody spacer C5/6: 6x14x71mm allograft interbody spacer C6/7: 6x14x81mm allograft interbody spacer C4 screws: Nuvasive 3.5x15 bilaterally C5 screws: Nuvasive 3.5x15 bilaterally C6 screws: Nuvasive 3.5x15 bilaterally C7 screw: Nuvasive 3.5x17 screws 56mm Nuvasive anterior cervical plate    Indication for procedure: Patient is a 62 y.o. female who presented to the office with signs and symptoms consistent with cervical myeloradiculopathy. The natural history of cervical myelopathy was explained to the patient. After this discussion, surgical intervention was recommended. The pre-operative MRI showed stenosis from C4-7 so a C4-7 anterior cervical discectomy and fusion was presented as a treatment option. The risks, including but not limited to pseudarthrosis, dysphagia, hematoma, airway compromise, recurrent laryngeal nerve injury, esophageal perforation, durotomy, spinal cord injury, nerve root injury, persistent pain, adjacent segment disease, infection, bleeding, hardware failure, vascular injury, heart attack, death, stroke, fracture, and need for additional procedures were discussed with the patient. The benefit of surgery would be preventing progression of the myelopathy and not to reverse  any myelopathic symptoms. Explained that the patient can expect to get relief of her radicular type pain, but she may not get full relief of her pain with this surgery, especially any neck pain. The alternatives to surgical management would be continued monitoring, physical therapy, over-the-counter pain medications, ambulatory aids, and activity modification. I reiterated that these modalities would not change the natural history of the disease and that is why operative management has been recommended. All the patient's questions were answered to her satisfaction. After this discussion, the patient expressed understanding and elected to proceed with surgical intervention.    Procedure Description: The patient was met in the pre-operative holding area. The patient's identity and consent were verified. The operative site was marked. The patient's remaining questions about the surgery were answered. The patient was brought back to the operating room. General anesthesia was induced and an endotracheal tube was placed by the anesthesia staff. The patient was transferred to the flat top Shepherd table in the supine position. All bony prominences were well padded. A bump was placed underneath the patient's shoulders to extend the neck slightly.  Neuromonitoring leads were attached to the patient by the neuromonitoring technologist. The patient's shoulders were than taped to the bed to improve the fluoroscopic visualization during the procedure. The surgical area was cleansed with alcohol. Fluoroscopy was then brought in to check rotation on the AP image and to mark the levels on the lateral image. The patient's skin was then prepped and draped in a standard, sterile fashion. A time out was performed that identified the patient, the procedure, and the operative levels. All team members agreed with what was stated in the time out.   A left-sided transverse incision was made in the middle of the previously marked levels.  Incision was taken sharply down through the skin and dermis. Electrocautery was used to dissect down to the level of the platysma. A Metzenbaum scissors was used to elevate flaps both cranially  and caudally above the platysma. A small opening was made in the platysma with the Metzenbaum scissors. The scissors were then placed under the platysma and electrocautery was used on top of the scissors to split the platysma. Blunt dissection was carried out medial to the sternocleidomastoid to identify the omohyoid. The omohyoid was then dissected over its lateral aspect with the Metzenbaum scissors. An appendiceal retractor was placed around the carotid sheath to retract it laterally and a cloward retractor was placed medially to retractor the esophagus and trachea medially. The interval between these structures was then bluntly dissected with the Metzenbaum scissors. At this point, the prevertebral fascia was visible within the wound. A kittner was used to dissect the prevertebral fascia off the vertebral bodies and discs. A bayonetted needle was placed into the disc to mark the level. A lateral fluoroscopic shot was then used to confirm the level. Once the correct level was identified, electrocautery was used to mark the disc space. The retractors that were previously placed were then put back into the wound. Electrocautery was used to elevate the longus coli off the bone on each side from C4 to C7.   Shadowline retractor blades were then placed under the longus coli on each side and the self-retainer was attached to these retractor blades. At this point, the microscope was brought in for visualization. A long handle fifteen blade was then used to incise the C6/7 disc space along the endplates and longitudinally to connect these end plate incisions to create a rectangular incision in the disc. A pituitary was used to remove the incised disc material. A 2 kerrison was used to remove the anterior osteophyte and square  the inferior endplate of the cranial vertebra. A combination of straight and curved curettes were used to remove further disc material and the cartilaginous aspect of the endplates. This was done until both the inferior and superior endplates had been prepared from the uncus to uncus and from ventral vertebral body to the posterior osteophytes. A burr was then used to remove the posterior vertebral osteophytes. Once the PLL was visualized within the wound, a nerve hook was used to develop the plane between the PLL and the dura. While lifting up the PLL with the nerve hook, a 1 kerrison was placed under the PLL and used to remove the PLL. Once some of the PLL had been removed, a combination of 1 and 2 kerrisons were used to continue removing the PLL until it had been completely removed. A curved curette was then placed into the foramen and pointed ventrally. It was used to remove soft tissue and the deep aspect of the uncus. A nerve hook was then easily passed into the foramen. The same process was repeated for the other foramen. Lateral fluoroscopic imaging was used to guide the insertion of trials into the disc space in a serial fashion, starting with the smallest trial. Once a good fit was obtained, the trial was left in and neuromonitoring signals were checked. There were no changes from baseline. AP and lateral fluoroscopic images were obtained and confirmed satisfactory position of the trial. A 6mm was selected for use as the final implant. The allograft interbody implant was then placed into the prepared disc space and lightly malleted into place. Neuromonitoring signals were checked again and no changes from baseline were noted. AP and lateral fluoroscopic images was obtained and confirmed satisfactory position of the final implant.  The retractor blades were removed from the wound. Then, the same steps  in the above paragraph were repeated for the C5/6 and C4/5 disc spaces. AP and lateral fluoroscopic  images of the entire C spine were obtained which showed satisfactory positioning of all the interbody implants.   The microscope was then moved away from the operative field and the retractor blades were removed from the wound. A cloward was then used to retract the esophagus and trachea away from the ventral vertebral bodies and another was used to retract the carotid and sternocleidomastoid laterally. A plate was then selected and placed over the ventral aspects of the vertebral bodies. No soft tissue structures were underneath the plate. An awl was put into the wound and into one of the C4 screw holes in the plate. It was aimed slightly medially. A 15mm screw was then inserted into the drilled hole. The same steps were repeated with the awl and screw to place the same sized screws in the bilateral C4, C5, and C6 holes. The same steps were repeated with the awl and screws at C7, except 17mm screws were inserted. Again, the wound was checked and no soft tissue structures were seen underneath the plate.   Final AP and lateral fluoroscopic films were taken and confirmed satisfactory position of the plate and interbody implants. The screws were final tightened. Hemostasis was achieved. A penrose drain was placed into the wound. The platysma was reapproximated using 2-0 vicryl. Neuromonitoring was checked and there were no changes from baseline. Neuromonitoring was discontinued at this time. The deep dermal layer was closed with 2-0 vicryl. The skin was closed with a 3-0 monocryl. All counts were correct at the end of the procedure. Benzoine and steri strips were used over the incision area. The wound was dressed with 4x4 gauze and paper tape. The patient was awakened from anesthesia and transferred to a bed where a hard cervical collar was applied. The patient was brought back to the post-anesthesia care unit in stable condition by the anesthesia staff.   Post-operative plan: The patient will recover in the  post-anesthesia care unit and then go to the floor. The patient will receive two post-operative doses of ancef. The patient will be out of bed as tolerated in a cervical collar. The patient will work with physical therapy. The drain will be removed on post-operative day one. The patient's disposition will be determined based off how she does on the floor post-operatively.       Willia Craze, MD Orthopedic Surgeon

## 2023-03-11 NOTE — Progress Notes (Signed)
Pt states that she took her Lisinopril 20 mg at 4 am today. Dr Sampson Goon is aware.

## 2023-03-11 NOTE — Transfer of Care (Signed)
Immediate Anesthesia Transfer of Care Note  Patient: [name]  Procedure(s) Performed: C4-5, C5-6, C6-7 ANTERIOR CERVICAL DECOMPRESSION/DISCECTOMY FUSION 3 LEVELS  Patient Location: PACU  Anesthesia Type:General  Level of Consciousness: drowsy and patient cooperative  Airway & Oxygen Therapy: Patient Spontanous Breathing and Patient connected to face mask oxygen  Post-op Assessment: Report given to RN and Post -op Vital signs reviewed and stable  Post vital signs: Reviewed and stable  Last Vitals:  Vitals Value Taken Time  BP 130/72 03/11/23 1445  Temp 36.5 C 03/11/23 1445  Pulse 94 03/11/23 1451  Resp 10 03/11/23 1451  SpO2 97 % 03/11/23 1451  Vitals shown include unvalidated device data.  Last Pain:  Vitals:   03/11/23 0657  TempSrc:   PainSc: 0-No pain         Complications: No notable events documented.

## 2023-03-11 NOTE — Anesthesia Postprocedure Evaluation (Signed)
Anesthesia Post Note  Patient: Adi Tetrault Kahl  Procedure(s) Performed: C4-5, C5-6, C6-7 ANTERIOR CERVICAL DECOMPRESSION/DISCECTOMY FUSION 3 LEVELS     Patient location during evaluation: PACU Anesthesia Type: General Level of consciousness: awake and alert Pain management: pain level controlled Vital Signs Assessment: post-procedure vital signs reviewed and stable Respiratory status: spontaneous breathing, nonlabored ventilation, respiratory function stable and patient connected to nasal cannula oxygen Cardiovascular status: blood pressure returned to baseline and stable Postop Assessment: no apparent nausea or vomiting Anesthetic complications: no  No notable events documented.  Last Vitals:  Vitals:   03/11/23 1530 03/11/23 1545  BP: 112/64 108/66  Pulse: 92 93  Resp: 10 10  Temp:    SpO2: 96% 95%    Last Pain:  Vitals:   03/11/23 1545  TempSrc:   PainSc: 0-No pain                 Jeanell Mangan,W. EDMOND

## 2023-03-11 NOTE — Progress Notes (Signed)
Orthopedic Tech Progress Note Patient Details:  [name] [birth year] [id number]  OR RN called requesting an ASPEN COLLAR, called in order STAT to [facility]   Patient ID: [name], female   DOB: 1961-08-03, 62 y.o.   MRN: [id number]  [name] 03/11/2023, 8:24 AM

## 2023-03-11 NOTE — H&P (Signed)
Orthopedic Spine Surgery H&P Note  Assessment: Patient is a 62 y.o. female with cervical myeloradiculopathy   Plan: -Out of bed as tolerated, activity as tolerated, no brace -Covered the risks of surgery one more time with the patient and patient elected to proceed with planned surgery -Written consent verified -Hold anticoagulation in anticipation of surgery -Ancef on all to OR -NPO for procedure -Site marked -To OR when ready  The patient has signs and symptoms consistent with cervical myelopathy. The most common natural history of cervical myelopathy was explained to the patient. Given this course, operative management in the form of C4-7 anterior cervical discectomy and fusion was recommended to the patient. The risks, including but not limited to pseudarthrosis, dysphagia, hematoma, airway compromise, recurrent laryngeal nerve injury, esophageal perforation, durotomy, spinal cord injury, nerve root injury, persistent pain, adjacent segment disease, infection, bleeding, hardware failure, vascular injury, heart attack, death, stroke, fracture, and need for additional procedures were discussed with the patient. The benefit of surgery would be preventing progression of the myelopathy and not to reverse any myelopathic symptoms. Explained that the other benefit would be improvement in her radiating arm pain. Tole her that she may not get full relief of her pain with this surgery, especially any neck pain. The alternatives to surgical management would be continued monitoring, physical therapy, over-the-counter pain medications, ambulatory aids, and activity modification. I reiterated that these modalities would not change the natural history of the disease and that is why operative management has been recommended. All the patient's questions were answered to her satisfaction. After this discussion, the patient expressed understanding and elected to proceed with surgical intervention.      ___________________________________________________________________________  Chief Complaint: neck pain that radiates into bilateral upper extremities  History: Patient is 62 y.o. female who has been previously seen in the office for neck pain that radiated into bilateral upper extremities. On further questioning and exam, she had signs/symptoms of myelopathy. Due to the natural history of myelopathy involving stepwise decline in function, operative intervention was recommended. The patient presents today with no changes in her symptoms since the last office visit. See previous office note for further details.    Review of systems: General: denies fevers and chills, myalgias Neurologic: denies recent changes in vision, slurred speech Abdomen: denies nausea, vomiting, hematemesis Respiratory: denies cough, shortness of breath  Past medical history: Hyperlipidemia Hypertension Migraines Depression/anxiety Fibromyalgia GERD   Allergies: Penicillin, metformin   Past surgical history:  Breast cyst excision   Social history: Denies use of nicotine product (smoking, vaping, patches, smokeless) Alcohol use: Denies Denies recreational drug use  Family history: -reviewed and not pertinent to cervical myeloradiculopathy   Physical Exam:  BMI of 29.1  General: no acute distress, appears stated age Neurologic: alert, answering questions appropriately, following commands Cardiovascular: regular rate, no cyanosis Respiratory: unlabored breathing on room air, symmetric chest rise Psychiatric: appropriate affect, normal cadence to speech   MSK (spine):  -Strength exam      Left  Right Grip strength                5/5  5/5 Interosseus   5/5   5/5 Wrist extension  5/5  5/5 Wrist flexion   5/5  5/5 Elbow extension  5/5  5/5 Elbow flexion   5/5  5/5 Deltoid    5/5  5/5  EHL    5/5  5/5 TA    5/5  5/5 GSC    5/5  5/5 Knee extension  5/5  5/5 Knee flexion   5/5  5/5 Hip  flexion   5/5  5/5  -Sensory exam    Sensation intact to light touch in L3-S1 nerve distributions of bilateral lower extremities  Sensation intact to light touch in C5-T1 nerve distributions of bilateral upper extremities   Patient name: [name] Patient MRN: [id number] Date: 03/11/23

## 2023-03-11 NOTE — Progress Notes (Signed)
Orthopedic Surgery Post-operative Progress Note  Assessment: Patient is a 62 y.o. female who is currently admitted after undergoing C4-7 ACDF  Plan: -Operative plans complete -Needs upright films when able -Drain to be maintained until tomorrow morning -Out of bed as tolerated with aspen collar -No bending/lifting/twisting greater than 10 pounds -PT evaluation and treat -Pain control -Diabetic diet -Antibiotics x2 post-operative doses -No antiplatelet or dvt chemoprophylaxis for 72 hours after surgery -Disposition: to floor from PACU   Hyperlipidemia -continue home lipitor  Hypertension -continue home amlodipine, lisinopril, and propanolol  Migraines Depression/anxiety -continue home buspar  Fibromyalgia -continue home amitriptyline and cymbalta  GERD -continue home PPI  DM -diabetic diet  -sliding scale insulin _________________________________________________________________________  Subjective: No acute events since surgery. Recovering in PACU. Pain adequately controlled. Has a sore throat. Has not tried eating/drinking yet. Denies paresthesias and numbness.   Objective:  General: no acute distress, appropriate affect Neurologic: alert, answering questions appropriately, following commands Respiratory: unlabored breathing on room air Neck: no hematoma appreciated, c collar in place Skin: dressing clear/dry/intact  MSK (spine):  -Strength exam      Right  Left Grip strength                5/5  5/5 Interosseus   5/5   5/5 Wrist extension  5/5  5/5 Wrist flexion   5/5  5/5 Elbow flexion   5/5  5/5 Deltoid    5/5  5/5  EHL    5/5  5/5 TA    5/5  5/5 GSC    5/5  5/5 Knee extension  5/5  5/5 Hip flexion   5/5  5/5  -Sensory exam    Sensation intact to light touch in L3-S1 nerve distributions of bilateral lower extremities  Sensation intact to light touch in C5-T1 nerve distributions of bilateral upper extremities   Patient name: [name] Patient MRN: [id number] Date: 03/11/23

## 2023-03-12 ENCOUNTER — Encounter (HOSPITAL_COMMUNITY): Payer: Self-pay | Admitting: Orthopedic Surgery

## 2023-03-12 DIAGNOSIS — M50021 Cervical disc disorder at C4-C5 level with myelopathy: Secondary | ICD-10-CM | POA: Diagnosis not present

## 2023-03-12 LAB — CBC
HCT: 37.3 % (ref 36.0–46.0)
Hemoglobin: 12.6 g/dL (ref 12.0–15.0)
MCH: 31.7 pg (ref 26.0–34.0)
MCHC: 33.8 g/dL (ref 30.0–36.0)
MCV: 93.7 fL (ref 80.0–100.0)
Platelets: 246 10*3/uL (ref 150–400)
RBC: 3.98 MIL/uL (ref 3.87–5.11)
RDW: 13 % (ref 11.5–15.5)
WBC: 16.1 10*3/uL — ABNORMAL HIGH (ref 4.0–10.5)
nRBC: 0 % (ref 0.0–0.2)

## 2023-03-12 LAB — BASIC METABOLIC PANEL
Anion gap: 11 (ref 5–15)
BUN: 14 mg/dL (ref 8–23)
CO2: 22 mmol/L (ref 22–32)
Calcium: 8.7 mg/dL — ABNORMAL LOW (ref 8.9–10.3)
Chloride: 101 mmol/L (ref 98–111)
Creatinine, Ser: 0.86 mg/dL (ref 0.44–1.00)
GFR, Estimated: >60 mL/min (ref >60)
Glucose, Bld: 206 mg/dL — ABNORMAL HIGH (ref 70–99)
Potassium: 4.2 mmol/L (ref 3.5–5.1)
Sodium: 134 mmol/L — ABNORMAL LOW (ref 135–145)

## 2023-03-12 LAB — GLUCOSE, CAPILLARY: Glucose-Capillary: 193 mg/dL — ABNORMAL HIGH (ref 70–99)

## 2023-03-12 MED ORDER — POLYETHYLENE GLYCOL 3350 17 G PO PACK: 17.0000 g | PACK | Freq: Every day | ORAL | 0 refills | Status: AC

## 2023-03-12 MED ORDER — ACETAMINOPHEN 500 MG PO TABS: 1000.0000 mg | ORAL_TABLET | Freq: Three times a day (TID) | ORAL | 0 refills | Status: AC

## 2023-03-12 MED ORDER — SENNA 8.6 MG PO TABS: 1.0000 | ORAL_TABLET | Freq: Two times a day (BID) | ORAL | 0 refills | Status: AC

## 2023-03-12 MED ORDER — INSULIN ASPART 100 UNIT/ML IJ SOLN: 5.0000 [IU] | Freq: Once | INTRAMUSCULAR | Status: DC

## 2023-03-12 MED ORDER — OXYCODONE HCL 5 MG PO TABS: 5.0000 mg | ORAL_TABLET | ORAL | 0 refills | Status: AC | PRN

## 2023-03-12 MED ORDER — MENTHOL 3 MG MT LOZG: 1.0000 | LOZENGE | OROMUCOSAL | 0 refills | Status: DC | PRN

## 2023-03-12 MED ORDER — METHOCARBAMOL 500 MG PO TABS: 500.0000 mg | ORAL_TABLET | Freq: Four times a day (QID) | ORAL | 0 refills | Status: AC

## 2023-03-12 NOTE — Discharge Instructions (Addendum)
Orthopedic Surgery Discharge Instructions  Patient name: [name] Procedure Performed: C4-7 ACDF Date of Surgery: 03/11/2023 Surgeon: Willia Craze, MD  Pre-operative Diagnosis: cervical myeloradiculopathy Post-operative Diagnosis: same as above  Discharge Date: 03/12/2023 Discharged to: home Discharge Condition: stable  Activity: You should wear your cervical collar for six weeks after surgery. It is okay to remove the cervical collar when you are in bed or when taking a shower, but you should have it on at all other times. You should refrain from bending, lifting, or twisting with objects greater than ten pounds until three months after surgery. You are encouraged to walk as much as desired. You can perform household activities such as cleaning dishes, doing laundry, vacuuming, etc. as long as the ten-pound restriction is followed.  Incision Care: Your incision site has a dressing over it. That dressing should remain in place and dry at all times for a total of one week after surgery. After one week, you can remove the dressing. Underneath the dressing, you will find pieces of tape. You should leave these pieces of tape in place. They will fall off with time. Do not pick, rub, or scrub at them. Do not put cream or lotion over the surgical area. After one week and once the dressing is off, it is okay to let soap and water run over your incision. Again, do not pick, scrub, or rub at the pieces of tape when bathing. Do not submerge (e.g., take a bath, swim, go in a hot tub, etc.) until six weeks after surgery. There may be some bloody drainage from the incision into the dressing after surgery. This is normal. You do not need to replace the dressing. Continue to leave it in place for the one week as instructed above. Should the dressing become saturated with blood or drainage, please call the office for further instructions.   Medications: You have been prescribed oxycodone. This is a narcotic  pain medication and should only be taken as prescribed. You should not drink alcohol or operate heavy machinery (including driving) while taking this medication. The oxycodone can cause constipation as a side effect. For that reason, you have been prescribed senna and miralax. These are both laxatives. You do not need to take this medication if you develop diarrhea. Should you remain constipated even while taking these medications, please increase the dose of miralax to twice daily. Tylenol has been prescribed to be taken every 8 hours, which will give you additional pain relief. Robaxin is a muscle relaxer that has been prescribed to you for muscle spasm type pain. Take this medication as needed. Lozenges were prescribed to you as well to help with the sore throat. All these medications were sent to [facility] in [place].  Do not take NSAIDs (ibuprofen, Aleve, Celebrex, naproxen, meloxicam, etc.) for the first 6 weeks after surgery as there is some evidence that their use may decrease the chances of successful fusion.   In order to set expectations for opioid prescriptions, you will only be prescribed opioids for a total of six weeks after surgery and, at two-weeks after surgery, your opioid prescription will start to tapered (decreased dosage and number of pills). If you have ongoing need for opioid medication six weeks after surgery, you will be referred to pain management. If you are already established with a provider that is giving you opioid medications, you should schedule an appointment with them for six weeks after surgery if you feel you are going to need another  prescription. State law only allows for opioid prescriptions one week at a time. If you are running out of opioid medication near the end of the week, please call the office during business hours before running out so I can send you another prescription.   You may resume any home blood thinners (aspirin, warfarin, lovenox, apixaban,  plavix, xarelto, etc.) 72 hours after your surgery. Take these medications as they were previously prescribed.   Driving: You should not drive while taking narcotic pain medications. You should also not drive if you feel you cannot turn your body enough to check your blind spots while in your cervical collar. In this case, you should wait six weeks to drive when your cervical collar will be discontinued. You should start getting back to driving slowly and you may want to try driving in a parking lot before doing anything more. Please be mindful that in some states it is illegal to drive with a cervical collar in place. You should check your local laws before driving and you may have to wait until the collar is discontinued before driving.  Diet: You are safe to resume your regular diet. A common complication after cervical spine surgery is trouble swallowing especially if the incision was made over the front part of your neck. This complication happens often and, the vast majority of the time, it is a temporary problem that gets better with time. The first few days after surgery, you should take more bites than normal to break the food into smaller pieces before swallowing. With time as the swallowing becomes easier, you can gradually get back to taking bites like you normally would.   Reasons to Call the Office After Surgery: You should feel free to call the office with any concerns or questions you have in the post-operative period, but you should definitely notify the office if you develop: -shortness of breath, chest pain, or trouble breathing -excessive bleeding, drainage, redness, or swelling around the surgical site -fevers, chills, or pain that is getting worse with each passing day -persistent nausea or vomiting -new weakness in any extremity, new or worsening numbness or tingling in any extremity -numbness in the groin, bowel or bladder incontinence -other concerns about your surgery  Follow  Up Appointments: You have an office appointment on Monday, June 3rd at 1:45pm with Dr. Christell Constant to check on your progress after surgery.   Office Information:  -Willia Craze, MD -Phone number: [phone] -Address: [address]       [place], [place] [address]

## 2023-03-12 NOTE — Progress Notes (Signed)
Orthopedic Surgery Post-operative Progress Note  Assessment: Patient is a 62 y.o. female who is currently admitted after undergoing C4-7 ACDF  Plan: -Operative plans complete -Drain removed this morning -Out of bed as tolerated with aspen collar -No bending/lifting/twisting greater than 10 pounds -PT evaluation and treat -Pain control -Diabetic diet -Ancef x2 post-operative doses -No antiplatelet or dvt chemoprophylaxis for 72 hours after surgery -Anticipate discharge to home today   Hyperlipidemia -continue home lipitor  Hypertension -continue home amlodipine, lisinopril, and propanolol  Migraines Depression/anxiety -continue home buspar  Fibromyalgia -continue home amitriptyline and cymbalta  GERD -continue home PPI  DM -diabetic diet  -sliding scale insulin _________________________________________________________________________  Subjective: No acute events overnight. Having some neck pain but medications are controlling that pain. No pain radiating into either upper extremity. Has walked the halls with nursing. Sore throat is still present but has improved. Was able to eat dinner last night. Took longer and she had to take smaller bites. Denies paresthesias and numbness.   Objective:  General: no acute distress, appropriate affect Neurologic: alert, answering questions appropriately, following commands Respiratory: unlabored breathing on room air Neck: no hematoma appreciated, c collar in place Skin: dressing clear/dry/intact  MSK (spine):  -Strength exam      Right  Left Grip strength                5/5  5/5 Interosseus   5/5   5/5 Wrist extension  5/5  5/5 Wrist flexion   5/5  5/5 Elbow flexion   5/5  5/5 Deltoid    5/5  5/5  EHL    5/5  5/5 TA    5/5  5/5 GSC    5/5  5/5 Knee extension  5/5  5/5 Hip flexion   5/5  5/5  -Sensory exam    Sensation intact to light touch in L3-S1 nerve distributions of bilateral lower extremities  Sensation  intact to light touch in C5-T1 nerve distributions of bilateral upper extremities   Patient name: [name] Patient MRN: [id number] Date: 03/12/23

## 2023-03-12 NOTE — Discharge Summary (Addendum)
Orthopedic Surgery Discharge Summary  Patient name: [name] Patient MRN: [id number] Admit today: 03/11/2023 Discharge date: 03/12/2023  Attending physician: Willia Craze, MD Final diagnosis: cervical myeloradiculopathy Findings: disc height loss and anterior osteophyte formation at C5/6 and C6/7  Hospital course: Patient is a 62 y.o. female who was admitted after undergoing C4-7 ACDF. The patient had significant pain immediately after surgery, but pain eventually was controlled with a multimodal regimen including oxycodone. Labs during the hospitalization revealed hyperglycemia but no other significant abnormalities. Her hyperglycemia was treated with insulin. The patient worked with physical therapy who recommended discharge to home. The patient was tolerating an oral diet with some discomfort. She was voiding spontaneously after surgery. The patient's vitals were stable on the day of discharge. The patient's drains were removed on the day of discharge. The patient was medically ready for discharge and was discharge to home on post-operative day 1.  Instructions:   Orthopedic Surgery Discharge Instructions  Patient name: [name] Procedure Performed: C4-7 ACDF Date of Surgery: 03/11/2023 Surgeon: Willia Craze, MD  Pre-operative Diagnosis: cervical myeloradiculopathy Post-operative Diagnosis: same as above  Discharge Date: 03/12/2023 Discharged to: home Discharge Condition: stable  Activity: You should wear your cervical collar for six weeks after surgery. It is okay to remove the cervical collar when you are in bed or when taking a shower, but you should have it on at all other times. You should refrain from bending, lifting, or twisting with objects greater than ten pounds until three months after surgery. You are encouraged to walk as much as desired. You can perform household activities such as cleaning dishes, doing laundry, vacuuming, etc. as long as the ten-pound  restriction is followed.  Incision Care: Your incision site has a dressing over it. That dressing should remain in place and dry at all times for a total of one week after surgery. After one week, you can remove the dressing. Underneath the dressing, you will find pieces of tape. You should leave these pieces of tape in place. They will fall off with time. Do not pick, rub, or scrub at them. Do not put cream or lotion over the surgical area. After one week and once the dressing is off, it is okay to let soap and water run over your incision. Again, do not pick, scrub, or rub at the pieces of tape when bathing. Do not submerge (e.g., take a bath, swim, go in a hot tub, etc.) until six weeks after surgery. There may be some bloody drainage from the incision into the dressing after surgery. This is normal. You do not need to replace the dressing. Continue to leave it in place for the one week as instructed above. Should the dressing become saturated with blood or drainage, please call the office for further instructions.   Medications: You have been prescribed oxycodone. This is a narcotic pain medication and should only be taken as prescribed. You should not drink alcohol or operate heavy machinery (including driving) while taking this medication. The oxycodone can cause constipation as a side effect. For that reason, you have been prescribed senna and miralax. These are both laxatives. You do not need to take this medication if you develop diarrhea. Should you remain constipated even while taking these medications, please increase the dose of miralax to twice daily. Tylenol has been prescribed to be taken every 8 hours, which will give you additional pain relief. Robaxin is a muscle relaxer that has been prescribed to you for muscle  spasm type pain. Take this medication as needed. Lozenges were prescribed to you as well to help with the sore throat. All these medications were sent to [facility] in  [place].  Do not take NSAIDs (ibuprofen, Aleve, Celebrex, naproxen, meloxicam, etc.) for the first 6 weeks after surgery as there is some evidence that their use may decrease the chances of successful fusion.   In order to set expectations for opioid prescriptions, you will only be prescribed opioids for a total of six weeks after surgery and, at two-weeks after surgery, your opioid prescription will start to tapered (decreased dosage and number of pills). If you have ongoing need for opioid medication six weeks after surgery, you will be referred to pain management. If you are already established with a provider that is giving you opioid medications, you should schedule an appointment with them for six weeks after surgery if you feel you are going to need another prescription. State law only allows for opioid prescriptions one week at a time. If you are running out of opioid medication near the end of the week, please call the office during business hours before running out so I can send you another prescription.   You may resume any home blood thinners (aspirin, warfarin, lovenox, apixaban, plavix, xarelto, etc.) 72 hours after your surgery. Take these medications as they were previously prescribed.   Driving: You should not drive while taking narcotic pain medications. You should also not drive if you feel you cannot turn your body enough to check your blind spots while in your cervical collar. In this case, you should wait six weeks to drive when your cervical collar will be discontinued. You should start getting back to driving slowly and you may want to try driving in a parking lot before doing anything more. Please be mindful that in some states it is illegal to drive with a cervical collar in place. You should check your local laws before driving and you may have to wait until the collar is discontinued before driving.  Diet: You are safe to resume your regular diet. A common complication after  cervical spine surgery is trouble swallowing especially if the incision was made over the front part of your neck. This complication happens often and, the vast majority of the time, it is a temporary problem that gets better with time. The first few days after surgery, you should take more bites than normal to break the food into smaller pieces before swallowing. With time as the swallowing becomes easier, you can gradually get back to taking bites like you normally would.   Reasons to Call the Office After Surgery: You should feel free to call the office with any concerns or questions you have in the post-operative period, but you should definitely notify the office if you develop: -shortness of breath, chest pain, or trouble breathing -excessive bleeding, drainage, redness, or swelling around the surgical site -fevers, chills, or pain that is getting worse with each passing day -persistent nausea or vomiting -new weakness in any extremity, new or worsening numbness or tingling in any extremity -numbness in the groin, bowel or bladder incontinence -other concerns about your surgery  Follow Up Appointments: You have an office appointment on Monday, June 3rd at 1:45pm with Dr. Christell Constant to check on your progress after surgery.   Office Information:  -Willia Craze, MD -Phone number: [phone] -Address: [address]       [place], [place] [address]

## 2023-03-12 NOTE — Evaluation (Signed)
Occupational Therapy Evaluation and Discharge Patient Details Name: [name] MRN: [id number] DOB: 02-10-1961 Today's Date: 03/12/2023   History of Present Illness Pt is a 62 yo female s/p C4-7 anterior cervical discectomy and fusion.   Clinical Impression   This 62 yo female admitted and underwent above presents to acute OT with all education completed with pt and son, we will D/C from acute OT.      Recommendations for follow up therapy are one component of a multi-disciplinary discharge planning process, led by the attending physician.  Recommendations may be updated based on patient status, additional functional criteria and insurance authorization.   Assistance Recommended at Discharge PRN  Patient can return home with the following Assistance with cooking/housework;Help with stairs or ramp for entrance    Functional Status Assessment  Patient has had a recent decline in their functional status and demonstrates the ability to make significant improvements in function in a reasonable and predictable amount of time. (without further need for skilled OT, all education and handout have been provided)  Equipment Recommendations  Other (comment) (RW)       Precautions / Restrictions Precautions Precautions: Cervical Precaution Booklet Issued: Yes (comment) Required Braces or Orthoses: Cervical Brace Cervical Brace: Hard collar;At all times Restrictions Weight Bearing Restrictions: No      Mobility Bed Mobility Overal bed mobility: Modified Independent                  Transfers   Equipment used: Rolling walker (2 wheels) Transfers: Sit to/from Stand Sit to Stand: Modified independent (Device/Increase time)           General transfer comment: does and feels better overall with ambulation with RW. Did step-too pattern for up and down steps with use of one rail at min guard A level (son will be there to A prn and they were also given a gait belt to use when  she does go up and down the steps). Son is also aware that he will have to take the RW up and down the steps for her.      Balance Overall balance assessment: Mild deficits observed, not formally tested                                         ADL either performed or assessed with clinical judgement   ADL Overall ADL's : Modified independent                                       General ADL Comments: Educated on cervical collar wear and care, bed mobility, use of cups with straws for drinking, use of 2 cups for brushing teeth (one to spit and one to rinse with straw), what type of clothes she can wear on UB and how do put them on and take them off to protect her neck     Vision Patient Visual Report: No change from baseline              Pertinent Vitals/Pain Pain Assessment Pain Assessment: No/denies pain     Hand Dominance Right   Extremity/Trunk Assessment Upper Extremity Assessment Upper Extremity Assessment: Generalized weakness              Cognition Arousal/Alertness: Awake/alert Behavior During Therapy: WFL for tasks assessed/performed Overall  Cognitive Status: Within Functional Limits for tasks assessed                                                  Home Living Family/patient expects to be discharged to:: Private residence Living Arrangements: Children Available Help at Discharge: Family;Available 24 hours/day Type of Home: Apartment Home Access: Stairs to enter Entrance Stairs-Number of Steps: 1 Entrance Stairs-Rails: None Home Layout: Two level;Bed/bath upstairs;1/2 bath on main level Alternate Level Stairs-Number of Steps: 10 Alternate Level Stairs-Rails: Left (going up) Bathroom Shower/Tub: Walk-in shower;Door   [employer] Toilet: Standard     Home Equipment: Hand held shower head;Shower seat          Prior Functioning/Environment Prior Level of Function : Independent/Modified  Independent;Driving                        OT Problem List: Pain;Impaired balance (sitting and/or standing)         OT Goals(Current goals can be found in the care plan section) Acute Rehab OT Goals Patient Stated Goal: home today ASAP         AM-PAC OT "6 Clicks" Daily Activity     Outcome Measure Help from another person eating meals?: None Help from another person taking care of personal grooming?: None Help from another person toileting, which includes using toliet, bedpan, or urinal?: None Help from another person bathing (including washing, rinsing, drying)?: None Help from another person to put on and taking off regular upper body clothing?: None Help from another person to put on and taking off regular lower body clothing?: None 6 Click Score: 24   End of Session Equipment Utilized During Treatment: Gait belt;Rolling walker (2 wheels) Nurse Communication:  (pt needs a RW and this is good to be D/C'd from therapy standpoint)  Activity Tolerance: Patient tolerated treatment well Patient left:  (sittin EOB')  OT Visit Diagnosis: Unsteadiness on feet (R26.81);Pain Pain - part of body:  (back of neck)                Time: 1610-9604 OT Time Calculation (min): 50 min Charges:  OT General Charges $OT Visit: 1 Visit OT Evaluation $OT Eval Moderate Complexity: 1 Mod OT Treatments $Self Care/Home Management : 23-37 mins  Lindon Romp OT Acute Rehabilitation Services Office [phone]    Evette Georges 03/12/2023, 12:27 PM

## 2023-03-13 ENCOUNTER — Other Ambulatory Visit: Payer: Self-pay | Admitting: Internal Medicine

## 2023-03-13 ENCOUNTER — Telehealth: Payer: Self-pay

## 2023-03-13 NOTE — Telephone Encounter (Signed)
Requested Prescriptions  Pending Prescriptions Disp Refills   TRULICITY 1.5 MG/0.5ML SOPN [Pharmacy Med Name: Trulicity 1.5 mg/0.5 mL subcutaneous pen injector] 2 mL 6    Sig: INJECT 1.5MG  INTO THE SKIN Once weekly     Endocrinology:  Diabetes - GLP-1 Receptor Agonists Passed - 03/13/2023  9:28 AM      Passed - HBA1C is between 0 and 7.9 and within 180 days    HbA1c, POC (prediabetic range)  Date Value Ref Range Status  05/20/2022 6.3 5.7 - 6.4 % Final   HbA1c, POC (controlled diabetic range)  Date Value Ref Range Status  12/17/2022 6.2 0.0 - 7.0 % Final   Hgb A1c MFr Bld  Date Value Ref Range Status  02/13/2023 6.6 (H) 4.8 - 5.6 % Final    Comment:             Prediabetes: 5.7 - 6.4          Diabetes: >6.4          Glycemic control for adults with diabetes: <7.0          Passed - Valid encounter within last 6 months    Recent Outpatient Visits           4 weeks ago Preoperative evaluation to rule out surgical contraindication   [place] [facility] Jonah Blue B, MD   2 months ago Type 2 diabetes mellitus with hyperlipidemia [facility])   [place] [facility] Jonah Blue B, MD   9 months ago Diabetes mellitus type 2 in obese [facility])   [place] [facility] Jonah Blue B, MD   1 year ago Diabetes mellitus type 2 in obese [facility])   [place] [facility] Jonah Blue B, MD   1 year ago Type 2 diabetes mellitus without complication, without long-term current use of insulin [facility])    [facility] Marcine Matar, MD       Future Appointments             In 1 month Laural Benes, Binnie Rail, MD [facility]

## 2023-03-13 NOTE — Transitions of Care (Post Inpatient/ED Visit) (Signed)
   03/13/2023  Name: [name] MRN: [id number] DOB: 1960/12/26  Today's TOC FU Call Status: Today's TOC FU Call Status:: Unsuccessul Call (1st Attempt) Unsuccessful Call (1st Attempt) Date: 03/13/23  Attempted to reach the patient regarding the most recent Inpatient/ED visit.  Follow Up Plan: Additional outreach attempts will be made to reach the patient to complete the Transitions of Care (Post Inpatient/ED visit) call.   Signature  Robyne Peers, RN

## 2023-03-16 ENCOUNTER — Encounter: Payer: Self-pay | Admitting: Orthopedic Surgery

## 2023-03-18 ENCOUNTER — Telehealth: Payer: Self-pay

## 2023-03-18 NOTE — Transitions of Care (Post Inpatient/ED Visit) (Signed)
   03/18/2023  Name: [name] MRN: [id number] DOB: 06-15-1961  Today's TOC FU Call Status: Today's TOC FU Call Status:: Unsuccessful Call (2nd Attempt) Unsuccessful Call (1st Attempt) Date: 03/13/23 Unsuccessful Call (2nd Attempt) Date: 03/18/23  Attempted to reach the patient regarding the most recent Inpatient/ED visit.  Follow Up Plan: Additional outreach attempts will be made to reach the patient to complete the Transitions of Care (Post Inpatient/ED visit) call.   Signature Robyne Peers, RN

## 2023-03-19 ENCOUNTER — Encounter (HOSPITAL_COMMUNITY): Payer: Self-pay | Admitting: Orthopedic Surgery

## 2023-03-19 ENCOUNTER — Telehealth: Payer: Self-pay

## 2023-03-19 ENCOUNTER — Encounter: Payer: Self-pay | Admitting: Orthopedic Surgery

## 2023-03-19 MED ORDER — CYCLOBENZAPRINE HCL 10 MG PO TABS: 10.0000 mg | ORAL_TABLET | Freq: Three times a day (TID) | ORAL | 0 refills | Status: DC | PRN

## 2023-03-19 NOTE — Transitions of Care (Post Inpatient/ED Visit) (Signed)
   03/19/2023  Name: [name] MRN: [id number] DOB: [birth year]  Today's TOC FU Call Status: Today's TOC FU Call Status:: Unsuccessful Call (3rd Attempt) Unsuccessful Call (1st Attempt) Date: 03/13/23 Unsuccessful Call (2nd Attempt) Date: 03/18/23 Unsuccessful Call (3rd Attempt) Date: 03/19/23  Attempted to reach the patient regarding the most recent Inpatient/ED visit.  Follow Up Plan: No further outreach attempts will be made at this time. We have been unable to contact the patient.  She has an appointment scheduled with [facility]- 03/24/2023  Signature   Robyne Peers, RN

## 2023-03-20 ENCOUNTER — Encounter: Payer: Self-pay | Admitting: Orthopedic Surgery

## 2023-03-20 ENCOUNTER — Telehealth: Payer: Self-pay | Admitting: Orthopedic Surgery

## 2023-03-20 NOTE — Telephone Encounter (Signed)
He will call her back at the end of clinic

## 2023-03-20 NOTE — Telephone Encounter (Signed)
Orthopedic Telephone Call  Spoke with patient this evening.  She has been having pain in her neck.  She describes a muscle spasm type pain.  She says that Robaxin had not been helping.  She just picked up the Flexeril and said that was helping her.  She has noticed pain in her right shoulder especially with overhead activity.  She describes it feels heavy and almost like a weakness. She has not noticed any numbness/paresthesias. She is able to lift her arm over her head. She said she hurts when she brushes her teeth in the right shoulder.  She does not have any pain on the left side.  There is no trauma or injury after surgery that causes pain.  She said it has been slowly getting better with time. She has an appointment with me on 6/03. I told her to keep taking the flexeril. I told her if they heaviness does not continue to improve, then she should get in contact with my office as I may have to have her evaluated sooner in the office or ER. Otherwise, I will plan to see her on 6/03.   London Sheer, MD Orthopedic Surgeon

## 2023-03-20 NOTE — Telephone Encounter (Signed)
Patient returned call to Dr. Christell Constant asked for a call back. Patient said her voicemail was full and Dr. Christell Constant could not leave a message.  The number to contact patient is [phone]

## 2023-03-22 ENCOUNTER — Encounter: Payer: Self-pay | Admitting: Internal Medicine

## 2023-03-23 ENCOUNTER — Encounter: Payer: Self-pay | Admitting: Orthopedic Surgery

## 2023-03-24 ENCOUNTER — Ambulatory Visit (INDEPENDENT_AMBULATORY_CARE_PROVIDER_SITE_OTHER): Payer: 59 | Admitting: Orthopedic Surgery

## 2023-03-24 ENCOUNTER — Encounter: Payer: 59 | Admitting: Orthopedic Surgery

## 2023-03-24 ENCOUNTER — Other Ambulatory Visit (INDEPENDENT_AMBULATORY_CARE_PROVIDER_SITE_OTHER): Payer: 59

## 2023-03-24 DIAGNOSIS — Z981 Arthrodesis status: Secondary | ICD-10-CM

## 2023-03-24 MED ORDER — CYCLOBENZAPRINE HCL 10 MG PO TABS: 10.0000 mg | ORAL_TABLET | Freq: Three times a day (TID) | ORAL | 1 refills | Status: DC | PRN

## 2023-03-24 MED ORDER — CYCLOBENZAPRINE HCL 10 MG PO TABS
10.0000 mg | ORAL_TABLET | Freq: Two times a day (BID) | ORAL | 1 refills | Status: DC | PRN
Start: 2023-03-24 — End: 2023-04-23

## 2023-03-24 NOTE — Progress Notes (Signed)
Orthopedic Surgery Post-operative Office Visit  Procedure: C4-7 anterior cervical discectomy and fusion Date of Surgery: 03/11/2023  Assessment: Patient is a 62 y.o. female who has had dysphagia post-operatively but is otherwise doing well after surgery. Radiating arm pain has improved.    Plan: -Operative plans complete -Out of bed as tolerated -Okay to let soap/water run over the incision, do not submerge -Patient could not tolerate the cervical collar, so this discontinued -No bending/lifting/twisting greater than 10 pounds -Pain management: tylenol, flexeril -Return to office in 4 weeks, cervical spine films needed at next visit: AP/lateral cervical  ___________________________________________________________________________   Subjective: Patient has been having difficulty with swallowing since discharge from the hospital. It varies in terms of severity. Some days she does not have any dysphagia and other days she notices it taking significantly longer to eat her food. Overall, she feels it has been gradually getting better. Her radiating arm pain has improved since surgery. Neck pain has been getting better. She is not requiring any narcotics and is getting adequate relief with flexeril and tylenol. She has not noticed any redness or drainage around her incision.   Objective:  General: no acute distress, appropriate affect Neurologic: alert, answering questions appropriately, following commands Respiratory: unlabored breathing on room air Skin: incision is well approximated with no erythema, induration, active/expressible drainage  MSK (spine):  -Strength exam      Left  Right Grip strength                5/5  5/5 Interosseus   5/5   5/5 Wrist extension  5/5  5/5 Wrist flexion   5/5  5/5 Elbow flexion   5/5  5/5 Deltoid    5/5  4+/5  EHL    5/5  5/5 TA    5/5  5/5 GSC    5/5  5/5 Knee extension  5/5  5/5 Hip flexion   5/5  5/5  -Sensory exam    Sensation intact  to light touch in L3-S1 nerve distributions of bilateral lower extremities  Sensation intact to light touch in C5-T1 nerve distributions of bilateral upper extremities   Imaging: X-rays of the cervical spine taken 03/24/2023 were independently reviewed and interpreted, showing anterior instrumentation from C4-7. There is no lucency around the screws. Screws have not backed out. Allograft interbody spacers appear in appropriate position and are in a similar position to immediate post-operative films on 03/11/2023.   Patient name: [name] Patient MRN: [id number] Date of visit: 03/24/23

## 2023-03-26 ENCOUNTER — Other Ambulatory Visit: Payer: Self-pay | Admitting: Internal Medicine

## 2023-03-26 ENCOUNTER — Other Ambulatory Visit: Payer: Self-pay | Admitting: Family Medicine

## 2023-03-26 ENCOUNTER — Encounter: Payer: 59 | Admitting: Orthopedic Surgery

## 2023-03-26 DIAGNOSIS — K219 Gastro-esophageal reflux disease without esophagitis: Secondary | ICD-10-CM

## 2023-03-26 DIAGNOSIS — E119 Type 2 diabetes mellitus without complications: Secondary | ICD-10-CM

## 2023-03-26 MED ORDER — ACCU-CHEK SOFTCLIX LANCETS MISC: 6 refills | Status: DC

## 2023-03-27 DIAGNOSIS — F330 Major depressive disorder, recurrent, mild: Secondary | ICD-10-CM | POA: Diagnosis not present

## 2023-04-03 ENCOUNTER — Encounter: Payer: Self-pay | Admitting: Orthopedic Surgery

## 2023-04-07 ENCOUNTER — Encounter: Payer: Self-pay | Admitting: Registered"

## 2023-04-07 ENCOUNTER — Encounter: Payer: Medicaid Other | Attending: Internal Medicine | Admitting: Registered"

## 2023-04-07 DIAGNOSIS — E119 Type 2 diabetes mellitus without complications: Secondary | ICD-10-CM | POA: Insufficient documentation

## 2023-04-07 NOTE — Progress Notes (Signed)
Diabetes Self-Management Education  Visit Type: First/Initial  Appt. Start Time: 1520 Appt. End Time: 1623  04/07/2023  [name], identified by name and date of birth, is a 62 y.o. female with a diagnosis of Diabetes: Type 2.   This patient is accompanied in the office by her son.   ASSESSMENT  Last menstrual period 06/06/2015. There is no height or weight on file to calculate BMI.  A1c: 6.6%  SMBG: FBS: ~135 mg/dL; 161 this morning after having coffee with sweetener. Lunch time before eating ~130-145; 7 -8 pm pre-meal ~145 mg/dL  DM Medications: Trulicity injections on Thursdays, Lantus 5 units evening.  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 Encouraged patient to talk to her doctor about her concerns.  Physical activity: Pt states she does not exercise because weather is too hot, rainy, etc. Gets exercise going up and down stairs in her house. Pt states a typical day activity includes going down stairs to make coffee and breakfast, the sits on couch to watch 2 tv shows, eats lunch, goes up stairs to take a nap, comes downstairs and watches TV in the afternoon eats dinner, goes back upstairs for bed. Other activity may be going grocery shopping with her son.  Dietary intake affected by recent surgery on her throat. Pt states she doesn't like yogurt, but likes cheese and tuna fish.  Pt's son said he used to make protein shakes with yogurt, fruit and greens and will start making for her again.   Diabetes Self-Management Education - 04/07/23 1500       Visit Information   Visit Type First/Initial      Initial Visit   Diabetes Type Type 2     Date Diagnosed 5 yrs ago    Are you currently following a meal plan? No    Are you taking your medications as prescribed? Yes      Health Coping   How would you rate your overall health? Fair      Psychosocial Assessment   Patient Belief/Attitude about Diabetes Motivated to manage diabetes    How often do you need to have someone help you when you read instructions, pamphlets, or other written materials from your doctor or pharmacy? 1 - Never    What is the last grade level you completed in school? 12      Complications   Last HgB A1C per patient/outside source 6.6 %    Have you had a dilated eye exam in the past 12 months? No   has appt next month   Have you had a dental exam in the past 12 months? No   last time 2 yrs ago   Are you checking your feet? No      Dietary Intake   Breakfast coffee, splenda, creamer, milk    Lunch spaghetti and meatballs    Snack (afternoon) none    Dinner tator [employer] (evening) none    Beverage(s) Simply lemonade, pedialyte, gatorade, low fat milk      Activity / Exercise   Activity / Exercise Type ADL's      Patient Education   Previous Diabetes Education No    Healthy  Eating Role of diet in the treatment of diabetes and the relationship between the three main macronutrients and blood glucose level;Reviewed blood glucose goals for pre and post meals and how to evaluate the patients' food intake on their blood glucose level.    Being Active Role of exercise on diabetes management, blood pressure control and cardiac health.;Helped patient identify appropriate exercises in relation to his/her diabetes, diabetes complications and other health issue.    Monitoring Purpose and frequency of SMBG.      Individualized Goals (developed by patient)   Nutrition General guidelines for healthy choices and portions discussed    Physical Activity Exercise 1-2 times per week    Monitoring  Test my blood glucose as discussed      Outcomes   Expected  Outcomes Demonstrated interest in learning. Expect positive outcomes    Future DMSE PRN    Program Status Not Completed            Individualized Plan for Diabetes Self-Management Training:   Learning Objective:  Patient will have a greater understanding of diabetes self-management. Patient education plan is to attend individual and/or group sessions per assessed needs and concerns.  Patient Instructions  Physical activity: Look into your insurance coverage to see if you can start going to the [facility]. Your One Pass code to use track and pool. Start with once a week and try to build up to 2-3 times per week.  Try for a couple more times up and down your stairs at your home.  Start reading labels and see how carbs and protein content in foods affect your blood sugar. Read the labels on your beverages to make sure you are not getting a lot of extra sugar  Consider checking your blood 2-3 times per day. Fasting number goal 80-130 mg/dL, and 2 hours after eating with a goal of less than 170 mg/dL  Aim to have dinner by about 7 pm, a snack later is okay as long as it isn't too high in sugar or fat.  Expected Outcomes:  Demonstrated interest in learning. Expect positive outcomes  Education material provided: A1c, Planning Healthy Meals, Nutrition Label  If problems or questions, patient to contact team via:  Phone and MyChart  Future DSME appointment: PRN

## 2023-04-07 NOTE — Patient Instructions (Addendum)
Physical activity: Look into your insurance coverage to see if you can start going to the [facility]. Your One Pass code to use track and pool. Start with once a week and try to build up to 2-3 times per week.  Try for a couple more times up and down your stairs at your home.  Start reading labels and see how carbs and protein content in foods affect your blood sugar. Read the labels on your beverages to make sure you are not getting a lot of extra sugar  Consider checking your blood 2-3 times per day. Fasting number goal 80-130 mg/dL, and 2 hours after eating with a goal of less than 170 mg/dL  Aim to have dinner by about 7 pm, a snack later is okay as long as it isn't too high in sugar or fat.

## 2023-04-08 ENCOUNTER — Encounter: Payer: Self-pay | Admitting: Orthopedic Surgery

## 2023-04-16 ENCOUNTER — Encounter: Payer: Self-pay | Admitting: Orthopedic Surgery

## 2023-04-18 ENCOUNTER — Encounter: Payer: Self-pay | Admitting: Orthopedic Surgery

## 2023-04-20 ENCOUNTER — Encounter: Payer: Self-pay | Admitting: Internal Medicine

## 2023-04-21 ENCOUNTER — Ambulatory Visit: Payer: Medicaid Other | Admitting: Internal Medicine

## 2023-04-21 ENCOUNTER — Encounter: Payer: Self-pay | Admitting: Orthopedic Surgery

## 2023-04-23 ENCOUNTER — Other Ambulatory Visit: Payer: Self-pay | Admitting: Orthopedic Surgery

## 2023-04-25 ENCOUNTER — Encounter: Payer: Self-pay | Admitting: Orthopedic Surgery

## 2023-04-28 ENCOUNTER — Other Ambulatory Visit (INDEPENDENT_AMBULATORY_CARE_PROVIDER_SITE_OTHER): Payer: Medicaid Other

## 2023-04-28 ENCOUNTER — Ambulatory Visit (INDEPENDENT_AMBULATORY_CARE_PROVIDER_SITE_OTHER): Payer: Medicaid Other | Admitting: Orthopedic Surgery

## 2023-04-28 DIAGNOSIS — Z981 Arthrodesis status: Secondary | ICD-10-CM | POA: Diagnosis not present

## 2023-04-28 NOTE — Progress Notes (Signed)
Orthopedic Surgery Post-operative Office Visit   Procedure: C4-7 anterior cervical discectomy and fusion Date of Surgery: 03/11/2023 (~6 weeks post-op)   Assessment: Patient is a 62 y.o. female who has improving but still present dysphagia. Is otherwise doing well after surgery     Plan: -Operative plans complete -Out of bed as tolerated, no brace needed -Okay to submerge wound at this time -No bending/lifting/twisting greater than 10 pounds -Recommended continued monitoring for her dysphagia since it has been improving.  Encouraged her to take more bites -Pain management: tylenol -Return to office in 6 weeks, cervical spine films needed at next visit: AP/lateral/flex/ex cervical   ___________________________________________________________________________     Subjective: Patient has had significant proved in her radiating arm pain bilaterally.  She still has mild pain that radiates into the left trapezius.  Sometimes, she gets right shoulder pain.  No other pain radiating into the upper extremities.  She has been having difficulty with swallowing.  She feels that food sometimes gets stuck in her throat.  She has had no issues with liquids.  She feels that the dysphagia has been gradually improving with time.  Denies paresthesias and numbness.   Objective:   General: no acute distress, appropriate affect Neurologic: alert, answering questions appropriately, following commands Respiratory: unlabored breathing on room air Skin: incision is well healed with no erythema, induration, active/expressible drainage   MSK (spine):   -Strength exam                                                   Left                  Right Grip strength                5/5                  5/5 Interosseus                  5/5                  5/5 Wrist extension            5/5                  5/5 Wrist flexion                 5/5                  5/5 Elbow flexion                5/5                   5/5 Deltoid                          5/5                  4+/5   EHL                              5/5                  5/5 TA  5/5                  5/5 GSC                             5/5                  5/5 Knee extension            5/5                  5/5 Hip flexion                    5/5                  5/5   -Sensory exam                           Sensation intact to light touch in L3-S1 nerve distributions of bilateral lower extremities             Sensation intact to light touch in C5-T1 nerve distributions of bilateral upper extremities     Imaging: X-rays of the cervical spine taken 04/28/2023 were independently reviewed and interpreted, showing anterior instrumentation from C4-7. There is no lucency around the screws. Screws have not backed out. Allograft interbody spacers appear in appropriate position and are in a similar position to immediate post-operative films on 03/11/2023.     Patient name: [name] Patient MRN: [id number] Date of visit: 04/28/23

## 2023-04-29 DIAGNOSIS — F330 Major depressive disorder, recurrent, mild: Secondary | ICD-10-CM | POA: Diagnosis not present

## 2023-05-06 DIAGNOSIS — H0102B Squamous blepharitis left eye, upper and lower eyelids: Secondary | ICD-10-CM | POA: Diagnosis not present

## 2023-05-06 DIAGNOSIS — H538 Other visual disturbances: Secondary | ICD-10-CM | POA: Diagnosis not present

## 2023-05-06 DIAGNOSIS — H0102A Squamous blepharitis right eye, upper and lower eyelids: Secondary | ICD-10-CM | POA: Diagnosis not present

## 2023-05-06 DIAGNOSIS — E119 Type 2 diabetes mellitus without complications: Secondary | ICD-10-CM | POA: Diagnosis not present

## 2023-05-06 DIAGNOSIS — H35033 Hypertensive retinopathy, bilateral: Secondary | ICD-10-CM | POA: Diagnosis not present

## 2023-05-06 DIAGNOSIS — H35413 Lattice degeneration of retina, bilateral: Secondary | ICD-10-CM | POA: Diagnosis not present

## 2023-05-06 DIAGNOSIS — H2513 Age-related nuclear cataract, bilateral: Secondary | ICD-10-CM | POA: Diagnosis not present

## 2023-05-19 ENCOUNTER — Ambulatory Visit: Payer: Medicaid Other | Admitting: Registered"

## 2023-05-22 ENCOUNTER — Encounter: Payer: Self-pay | Admitting: Orthopedic Surgery

## 2023-05-29 DIAGNOSIS — F330 Major depressive disorder, recurrent, mild: Secondary | ICD-10-CM | POA: Diagnosis not present

## 2023-06-03 ENCOUNTER — Other Ambulatory Visit: Payer: Self-pay | Admitting: Internal Medicine

## 2023-06-03 ENCOUNTER — Encounter: Payer: Self-pay | Admitting: Internal Medicine

## 2023-06-03 DIAGNOSIS — E1169 Type 2 diabetes mellitus with other specified complication: Secondary | ICD-10-CM

## 2023-06-05 ENCOUNTER — Telehealth: Payer: Self-pay

## 2023-06-05 NOTE — Telephone Encounter (Signed)
VMT pt requesting call back to set up time for intake for 06/17/23 PREP class she would like to do with her son.

## 2023-06-09 ENCOUNTER — Ambulatory Visit: Payer: Medicaid Other | Admitting: Orthopedic Surgery

## 2023-06-09 ENCOUNTER — Encounter: Payer: Self-pay | Admitting: Orthopedic Surgery

## 2023-06-12 NOTE — Telephone Encounter (Signed)
Sorry I had to work her in for Monday. She was originally scheduled then said she couldn't come but now says she already got a ride and wants monday

## 2023-06-12 NOTE — Progress Notes (Signed)
[facility] PREP Evaluation  Patient Details  Name: [name] MRN: [id number] Date of Birth: 07-16-1961 Age: 62 y.o. PCP: Patricia Matar, MD  Vitals:   06/12/23 1230  BP: 102/68  Pulse: 68  SpO2: 95%  Weight: 184 lb (83.5 kg)     [facility] Eval - 06/12/23 1300       [facility] "PREP" Location   [facility] "PREP" Location [facility]      Referral    Referring Provider [name]    Reason for referral Inactivity;Hypertension;Diabetes;High Cholesterol    Program Start Date 06/17/23   T/TH 1p-215p x 12wks     Measurement   Waist Circumference 40 inches    Hip Circumference 43 inches    Body fat 39 percent      Information for Trainer   Goals weight goal 160, inc activity, control DM    Current Exercise walks occ    Orthopedic Concerns back pain, hx of neck fusion, frozen shoulder    Pertinent Medical History DM2, HTN, inc chol    Current Barriers none    Restrictions/Precautions Diabetic snack before exercise    Medications that affect exercise Medication causing dizziness/drowsiness      Timed Up and Go (TUGS)   Timed Up and Go Moderate risk 10-12 seconds      Mobility and Daily Activities   I find it easy to walk up or down two or more flights of stairs. 2    I have no trouble taking out the trash. 3    I do housework such as vacuuming and dusting on my own without difficulty. 1    I can easily lift a gallon of milk (8lbs). 4    I can easily walk a mile. 1    I have no trouble reaching into high cupboards or reaching down to pick up something from the floor. 2    I do not have trouble doing out-door work such as Loss adjuster, chartered, raking leaves, or gardening. 1      Mobility and Daily Activities   I feel younger than my age. 2    I feel independent. 2    I feel energetic. 1    I live an active life.  1    I feel strong. 1    I feel healthy. 1    I feel active as other people my age. 1      How fit and strong are you.   Fit and Strong Total Score 23             Past Medical History:  Diagnosis Date   Anxiety    Arthritis    Depression    Diabetes mellitus without complication (HCC)    GERD (gastroesophageal reflux disease)    Hyperlipidemia    Hypertension    Migraines    Thyroid disease    Past Surgical History:  Procedure Laterality Date   ANTERIOR CERVICAL DECOMP/DISCECTOMY FUSION N/A 03/11/2023   Procedure: C4-5, C5-6, C6-7 ANTERIOR CERVICAL DECOMPRESSION/DISCECTOMY FUSION 3 LEVELS;  Surgeon: Patricia Sheer, MD;  Location: [facility] OR;  Service: Orthopedics;  Laterality: N/A;   BREAST CYST EXCISION Right 1980   BREAST CYST EXCISION Left 1985   NO PAST SURGERIES     Social History   Tobacco Use  Smoking Status Never  Smokeless Tobacco Never    [name] 06/12/2023, 2:03 PM

## 2023-06-12 NOTE — Telephone Encounter (Signed)
Please advise 

## 2023-06-13 ENCOUNTER — Other Ambulatory Visit (INDEPENDENT_AMBULATORY_CARE_PROVIDER_SITE_OTHER): Payer: Medicaid Other

## 2023-06-13 ENCOUNTER — Ambulatory Visit: Payer: Medicaid Other | Admitting: Orthopedic Surgery

## 2023-06-13 DIAGNOSIS — Z981 Arthrodesis status: Secondary | ICD-10-CM

## 2023-06-13 NOTE — Telephone Encounter (Signed)
I will see her if she is late for her 230 appointment but the sooner she gets there to better since I am apparently double booked at 330 and 345

## 2023-06-13 NOTE — Progress Notes (Signed)
Orthopedic Surgery Post-operative Office Visit   Procedure: C4-7 anterior cervical discectomy and fusion Date of Surgery: 03/11/2023 (~3 months post-op)   Assessment: Patient is a 62 y.o. female who had dysphagia post-operative but that has resolved. Doing well post-operatively     Plan: -Operative plans complete -Out of bed as tolerated, no brace -No spine specific restrictions at this time -Pain management: tylenol -Return to office in 3 months, films needed at next visit: AP/lateral/flex/ex cervical   ___________________________________________________________________________     Subjective: Patient has been doing well since she was last seen. She is not having any more issues with swallowing. She says she can eat pretty much whatever she wants and is swallowing like she was before surgery. Not having any pain radiating into either upper extremity. Sometimes has some neck stiffness and soreness.    Objective:   General: no acute distress, appropriate affect Neurologic: alert, answering questions appropriately, following commands Respiratory: unlabored breathing on room air Skin: incision is well healed   MSK (spine):   -Strength exam                                                   Left                  Right Grip strength                5/5                  5/5 Interosseus                  5/5                  5/5 Wrist extension            5/5                  5/5 Wrist flexion                 5/5                  5/5 Elbow flexion                5/5                  5/5 Deltoid                          5/5                  4+/5   EHL                              5/5                  5/5 TA                                 5/5                  5/5 GSC  5/5                  5/5 Knee extension            5/5                  5/5 Hip flexion                    5/5                  5/5   -Sensory exam                           Sensation  intact to light touch in L3-S1 nerve distributions of bilateral lower extremities             Sensation intact to light touch in C5-T1 nerve distributions of bilateral upper extremities     Imaging: X-rays of the cervical spine taken 06/13/2023 were independently reviewed and interpreted, showing anterior instrumentation from C4-7. There is no lucency around the screws. Screws have not backed out. Allograft interbody spacers appear in appropriate position and are in a similar position to immediate post-operative films on 03/11/2023.     Patient name: [name] Patient MRN: [id number] Date of visit: 06/13/23

## 2023-06-16 ENCOUNTER — Other Ambulatory Visit: Payer: Self-pay

## 2023-06-16 ENCOUNTER — Ambulatory Visit: Payer: Medicaid Other | Admitting: Surgical

## 2023-06-16 DIAGNOSIS — Z981 Arthrodesis status: Secondary | ICD-10-CM

## 2023-06-16 DIAGNOSIS — M19011 Primary osteoarthritis, right shoulder: Secondary | ICD-10-CM | POA: Diagnosis not present

## 2023-06-16 DIAGNOSIS — G8929 Other chronic pain: Secondary | ICD-10-CM | POA: Diagnosis not present

## 2023-06-16 DIAGNOSIS — M25511 Pain in right shoulder: Secondary | ICD-10-CM

## 2023-06-19 ENCOUNTER — Ambulatory Visit: Payer: Medicaid Other | Admitting: Orthopedic Surgery

## 2023-06-20 ENCOUNTER — Encounter: Payer: Self-pay | Admitting: Surgical

## 2023-06-20 MED ORDER — BUPIVACAINE HCL 0.25 % IJ SOLN
9.0000 mL | INTRAMUSCULAR | Status: AC | PRN
Start: 2023-06-16 — End: 2023-06-16
  Administered 2023-06-16: 9 mL via INTRA_ARTICULAR

## 2023-06-20 MED ORDER — LIDOCAINE HCL 1 % IJ SOLN
5.0000 mL | INTRAMUSCULAR | Status: AC | PRN
Start: 2023-06-16 — End: 2023-06-16
  Administered 2023-06-16: 5 mL

## 2023-06-20 MED ORDER — METHYLPREDNISOLONE ACETATE 40 MG/ML IJ SUSP
40.0000 mg | INTRAMUSCULAR | Status: AC | PRN
Start: 2023-06-16 — End: 2023-06-16
  Administered 2023-06-16: 40 mg via INTRA_ARTICULAR

## 2023-06-20 NOTE — Progress Notes (Signed)
Office Visit Note   Patient: [name]           Date of Birth: 15-Dec-1960           MRN: [id number] Visit Date: 06/16/2023 Requested by: Marcine Matar, MD [address] [place] [address] [place],  [place] [address] PCP: Marcine Matar, MD  Subjective: Chief Complaint  Patient presents with   Right Shoulder - Pain    HPI: [name] is a 62 y.o. female who presents to the office reporting right shoulder pain.  Patient states that her pain began in late May.  She describes mid humeral and shoulder pain that will occasionally radiate to the elbow and sometimes to the wrist.  She has been taking Tylenol for pain with little relief.  Describes pain as a soreness.  She has no numbness or tingling or any neck pain height.  Does have some occasional scapular pain.  Feels that her shoulder is stiff.  She has difficulty reaching behind her and difficulty putting her bra on.  She denies any new weakness or any difficulty swallowing.  She has had recent cervical spine surgery by Dr. Christell Constant and is recovering well from this.  States that this increase in her shoulder pain has caused her to wake up with pain pretty much every night.  No history of prior surgery to the right shoulder.  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                ROS: All systems reviewed are negative as they relate to the chief complaint within the history of present illness.  Patient denies fevers or chills.  Assessment & Plan: Visit Diagnoses:  1. Arthritis of right glenohumeral joint   2. S/P cervical spinal fusion   3. Chronic right shoulder pain     Plan: Patient is a 62 year old female who presents for evaluation of right shoulder pain.  Has about 3 months of pain without any history of injury.  Has stiffness associated with her pain.  She is concerned about  frozen shoulder.  Does have some loss of passive motion compared with the contralateral shoulder but she has prior MRI demonstrating mild to moderate arthritis of the shoulder which may be the primary culprit.  Regardless, after discussion of options, patient would like to try right glenohumeral injection.  This was administered under ultrasound guidance.  Recommended she continue with range of motion exercises and follow-up with the office as needed if pain does not improve.  Follow-Up Instructions: No follow-ups on file.   Orders:  Orders Placed This Encounter  Procedures   US Guided Needle Placement - No Linked Charges   No orders of the defined types were placed in this encounter.     Procedures: Large Joint Inj: R glenohumeral on 06/16/2023 10:44 AM Details: 22 G 3.5 in needle, ultrasound-guided posterior approach Medications: 5 mL lidocaine 1 %; 9 mL bupivacaine 0.25 %; 40 mg methylPREDNISolone acetate 40 MG/ML Outcome: tolerated well, no immediate complications Procedure, treatment alternatives, risks and benefits explained, specific risks discussed. Consent was given by the patient. Patient was prepped and draped in the usual sterile fashion.       Clinical Data: No additional findings.  Objective: Vital Signs: LMP 06/06/2015   Physical Exam:  Constitutional: Patient appears well-developed HEENT:  Head: Normocephalic Eyes:EOM  are normal Neck: Normal range of motion Cardiovascular: Normal rate Pulmonary/chest: Effort normal Neurologic: Patient is alert Skin: Skin is warm Psychiatric: Patient has normal mood and affect  Ortho Exam: Ortho exam demonstrates right shoulder with 40 degrees external rotation, 90 degrees abduction, 150 degrees forward elevation passively and actively.  This compared with the left shoulder with 50 degrees X rotation, 110 degrees abduction, 170 degrees forward elevation passively and actively.  Excellent rotator cuff strength of right shoulder  including supra, infra, subscap strength rated 5/5.  Axillary nerve intact with deltoid firing.  Intact EPL, FPL, finger abduction, pronation/supination, bicep, tricep, deltoid.  No cellulitis or skin changes noted around the right shoulder region.  Specialty Comments:  MRI CERVICAL SPINE WITHOUT CONTRAST   TECHNIQUE: Multiplanar, multisequence MR imaging of the cervical spine was performed. No intravenous contrast was administered.   COMPARISON:  None Available.   FINDINGS: Alignment: Physiologic.   Vertebrae: No acute fracture, evidence of discitis, or aggressive bone lesion.   Cord: Normal signal and morphology.   Posterior Fossa, vertebral arteries, paraspinal tissues: Posterior fossa demonstrates no focal abnormality. Vertebral artery flow voids are maintained. Paraspinal soft tissues are unremarkable.   Disc levels:   Discs: Degenerative disease with disc height loss at C4-5, C5-6 and C6-7.   C2-3: No disc protrusion. Mild right facet arthropathy. Moderate-severe right foraminal stenosis. No left foraminal stenosis. No spinal stenosis.   C3-4: Mild broad-based disc bulge. Moderate left and mild right facet arthropathy. Moderate-severe left foraminal stenosis. Mild right foraminal stenosis. No spinal stenosis.   C4-5: Broad left paracentral disc protrusion contacting the left ventral paracentral cervical spinal cord. Moderate left foraminal stenosis. Mild right foraminal stenosis. Mild spinal stenosis.   C5-6: Mild broad-based disc bulge. Bilateral uncovertebral degenerative changes. Severe bilateral foraminal stenosis. Mild spinal stenosis.   C6-7: Broad-based disc bulge. Mild-moderate left foraminal stenosis. Mild right foraminal stenosis. No spinal stenosis.   C7-T1: Small central disc protrusion. No foraminal or central canal stenosis.   IMPRESSION: 1. Cervical spine spondylosis as described above. 2. No acute osseous injury of the cervical spine.      Electronically Signed   By: Elige Ko M.D.   On: 12/25/2022 09:13  Imaging: No results found.   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   Brain atrophy (HCC) 12/18/2022   Complete tear of right rotator cuff 05/16/2022   Adhesive capsulitis of right shoulder 03/14/2022   Influenza vaccine needed 10/31/2020   Need for diphtheria-tetanus-pertussis (Tdap) vaccine 10/31/2020   Hyperlipidemia associated with type 2 diabetes mellitus (HCC) 10/31/2020   Overweight (BMI 25.0-29.9) 10/31/2020   Type 2 diabetes mellitus without complication, without long-term current use of insulin (HCC) 07/31/2020   OSA (obstructive sleep apnea) 02/02/2020   Spondylosis without myelopathy or radiculopathy, lumbar region 09/17/2017   Essential hypertension 09/05/2017   Chronic bilateral low back pain with left-sided sciatica 09/05/2017   HTN (hypertension) 05/05/2017   Hypothyroidism 02/24/2017   Mixed hyperlipidemia 02/24/2017   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   Sleep related headaches 01/23/2017   Snoring 01/23/2017   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   Chronic headaches 10/30/2016   Obesity (BMI 30.0-34.9) 05/24/2016   PMB (postmenopausal bleeding) 05/24/2016   Back pain of lumbar region with sciatica 09/28/2015   Past Medical History:  Diagnosis Date   Anxiety    Arthritis    Depression    Diabetes mellitus without complication (HCC)    GERD (gastroesophageal  reflux disease)    Hyperlipidemia    Hypertension    Migraines    Thyroid disease     Family History  Problem Relation Age of Onset   Headache Neg Hx    Colon cancer Neg Hx    Esophageal cancer Neg Hx    Rectal cancer Neg Hx    Stomach cancer Neg Hx    Breast cancer Neg Hx     Past Surgical History:  Procedure Laterality Date   ANTERIOR CERVICAL DECOMP/DISCECTOMY FUSION N/A 03/11/2023   Procedure: C4-5, C5-6, C6-7 ANTERIOR CERVICAL  DECOMPRESSION/DISCECTOMY FUSION 3 LEVELS;  Surgeon: London Sheer, MD;  Location: [facility] OR;  Service: Orthopedics;  Laterality: N/A;   BREAST CYST EXCISION Right 1980   BREAST CYST EXCISION Left 1985   NO PAST SURGERIES     Social History   Occupational History   Occupation: Unemployed  Tobacco Use   Smoking status: Never   Smokeless tobacco: Never  Vaping Use   Vaping status: Never Used  Substance and Sexual Activity   Alcohol use: No    Alcohol/week: 0.0 standard drinks of alcohol   Drug use: No   Sexual activity: Not Currently    Partners: Male

## 2023-06-24 ENCOUNTER — Telehealth: Payer: Self-pay

## 2023-06-24 NOTE — Telephone Encounter (Signed)
Text from pt's son with message that pt would not be able to attend PREP due to nausea and frozen shoulder asking to be withdrawn from classes.  Attended no sessions.

## 2023-06-25 ENCOUNTER — Other Ambulatory Visit: Payer: Self-pay | Admitting: Internal Medicine

## 2023-06-25 DIAGNOSIS — I152 Hypertension secondary to endocrine disorders: Secondary | ICD-10-CM

## 2023-06-25 DIAGNOSIS — K219 Gastro-esophageal reflux disease without esophagitis: Secondary | ICD-10-CM

## 2023-06-30 ENCOUNTER — Ambulatory Visit (INDEPENDENT_AMBULATORY_CARE_PROVIDER_SITE_OTHER): Payer: Medicaid Other | Admitting: Surgical

## 2023-06-30 ENCOUNTER — Encounter: Payer: Self-pay | Admitting: Surgical

## 2023-06-30 DIAGNOSIS — M19011 Primary osteoarthritis, right shoulder: Secondary | ICD-10-CM

## 2023-06-30 NOTE — Progress Notes (Signed)
Follow-up Office Visit Note   Patient: [name]           Date of Birth: November 04, 1960           MRN: [id number] Visit Date: 06/30/2023 Requested by: Marcine Matar, MD [address] [place] [address] [place],  [place] [address] PCP: Marcine Matar, MD  Subjective: Chief Complaint  Patient presents with   Right Shoulder - Follow-up    HPI: [name] is a 62 y.o. female who returns to the office for follow-up visit.    Plan at last visit was: Right glenohumeral injection on 06/16/2023  Since then, patient notes 80% improvement.  Has been doing home exercise program.  Really does not have any significant complaints regarding her right shoulder.              ROS: All systems reviewed are negative as they relate to the chief complaint within the history of present illness.  Patient denies fevers or chills.  Assessment & Plan: Visit Diagnoses:  1. Arthritis of right glenohumeral joint     Plan: [name] is a 62 y.o. female who returns to the office for follow-up visit.  Plan from last visit was noted above in HPI.  They now return with significant relief from glenohumeral injection.  We can do this injection every 4 months or so as needed.  Recommended she follow-up with the office as needed and reach back out to [place] when the shoulder develops recurrence of pain.  Does seem that early arthritis is the main driver of her symptoms based on relief from this glenohumeral injection.  Follow-Up Instructions: No follow-ups on file.   Orders:  No orders of the defined types were placed in this encounter.  No orders of the defined types were placed in this encounter.     Procedures: No procedures performed   Clinical Data: No additional findings.  Objective: Vital Signs: LMP 06/06/2015   Physical Exam:  Constitutional: Patient appears well-developed HEENT:  Head: Normocephalic Eyes:EOM are normal Neck: Normal range of motion Cardiovascular: Normal  rate Pulmonary/chest: Effort normal Neurologic: Patient is alert Skin: Skin is warm Psychiatric: Patient has normal mood and affect  Ortho Exam: Ortho exam demonstrates right shoulder with 70 degrees X rotation, 85 degrees abduction, 170 degrees forward elevation passively and actively.  No pain with passive motion or active motion.  She is able to get her arm overhead actively.  Excellent rotator cuff strength.  Axillary nerve intact with deltoid firing.  No evidence of cellulitis around the injection site.  Specialty Comments:  MRI CERVICAL SPINE WITHOUT CONTRAST   TECHNIQUE: Multiplanar, multisequence MR imaging of the cervical spine was performed. No intravenous contrast was administered.   COMPARISON:  None Available.   FINDINGS: Alignment: Physiologic.   Vertebrae: No acute fracture, evidence of discitis, or aggressive bone lesion.   Cord: Normal signal and morphology.   Posterior Fossa, vertebral arteries, paraspinal tissues: Posterior fossa demonstrates no focal abnormality. Vertebral artery flow voids are maintained. Paraspinal soft tissues are unremarkable.   Disc levels:   Discs: Degenerative disease with disc height loss at C4-5, C5-6 and C6-7.   C2-3: No disc protrusion. Mild right facet arthropathy. Moderate-severe right foraminal stenosis. No left foraminal stenosis. No spinal stenosis.   C3-4: Mild broad-based disc bulge. Moderate left and mild right facet arthropathy. Moderate-severe left foraminal stenosis. Mild right foraminal stenosis. No spinal stenosis.   C4-5: Broad left paracentral disc protrusion contacting the left  ventral paracentral cervical spinal cord. Moderate left foraminal stenosis. Mild right foraminal stenosis. Mild spinal stenosis.   C5-6: Mild broad-based disc bulge. Bilateral uncovertebral degenerative changes. Severe bilateral foraminal stenosis. Mild spinal stenosis.   C6-7: Broad-based disc bulge. Mild-moderate left foraminal  stenosis. Mild right foraminal stenosis. No spinal stenosis.   C7-T1: Small central disc protrusion. No foraminal or central canal stenosis.   IMPRESSION: 1. Cervical spine spondylosis as described above. 2. No acute osseous injury of the cervical spine.     Electronically Signed   By: Elige Ko M.D.   On: 12/25/2022 09:13  Imaging: No results found.   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   Brain atrophy (HCC) 12/18/2022   Complete tear of right rotator cuff 05/16/2022   Adhesive capsulitis of right shoulder 03/14/2022   Influenza vaccine needed 10/31/2020   Need for diphtheria-tetanus-pertussis (Tdap) vaccine 10/31/2020   Hyperlipidemia associated with type 2 diabetes mellitus (HCC) 10/31/2020   Overweight (BMI 25.0-29.9) 10/31/2020   Type 2 diabetes mellitus without complication, without long-term current use of insulin (HCC) 07/31/2020   OSA (obstructive sleep apnea) 02/02/2020   Spondylosis without myelopathy or radiculopathy, lumbar region 09/17/2017   Essential hypertension 09/05/2017   Chronic bilateral low back pain with left-sided sciatica 09/05/2017   HTN (hypertension) 05/05/2017   Hypothyroidism 02/24/2017   Mixed hyperlipidemia 02/24/2017   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   Sleep related headaches 01/23/2017   Snoring 01/23/2017   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   Chronic headaches 10/30/2016   Obesity (BMI 30.0-34.9) 05/24/2016   PMB (postmenopausal bleeding) 05/24/2016   Back pain of lumbar region with sciatica 09/28/2015   Past Medical History:  Diagnosis Date   Anxiety    Arthritis    Depression    Diabetes mellitus without complication (HCC)    GERD (gastroesophageal reflux disease)    Hyperlipidemia    Hypertension    Migraines    Thyroid disease     Family History  Problem Relation Age of Onset   Headache Neg Hx    Colon cancer Neg Hx    Esophageal cancer Neg  Hx    Rectal cancer Neg Hx    Stomach cancer Neg Hx    Breast cancer Neg Hx     Past Surgical History:  Procedure Laterality Date   ANTERIOR CERVICAL DECOMP/DISCECTOMY FUSION N/A 03/11/2023   Procedure: C4-5, C5-6, C6-7 ANTERIOR CERVICAL DECOMPRESSION/DISCECTOMY FUSION 3 LEVELS;  Surgeon: London Sheer, MD;  Location: [facility] OR;  Service: Orthopedics;  Laterality: N/A;   BREAST CYST EXCISION Right 1980   BREAST CYST EXCISION Left 1985   NO PAST SURGERIES     Social History   Occupational History   Occupation: Unemployed  Tobacco Use   Smoking status: Never   Smokeless tobacco: Never  Vaping Use   Vaping status: Never Used  Substance and Sexual Activity   Alcohol use: No    Alcohol/week: 0.0 standard drinks of alcohol   Drug use: No   Sexual activity: Not Currently    Partners: Male

## 2023-07-11 ENCOUNTER — Other Ambulatory Visit: Payer: Self-pay | Admitting: Internal Medicine

## 2023-07-11 ENCOUNTER — Ambulatory Visit: Payer: Medicaid Other | Attending: Internal Medicine | Admitting: Internal Medicine

## 2023-07-11 ENCOUNTER — Encounter: Payer: Self-pay | Admitting: Internal Medicine

## 2023-07-11 VITALS — BP 110/80 | HR 84 | Temp 98.2°F | Ht 67.0 in | Wt 183.0 lb

## 2023-07-11 DIAGNOSIS — Z7985 Long-term (current) use of injectable non-insulin antidiabetic drugs: Secondary | ICD-10-CM

## 2023-07-11 DIAGNOSIS — E1169 Type 2 diabetes mellitus with other specified complication: Secondary | ICD-10-CM

## 2023-07-11 DIAGNOSIS — Z23 Encounter for immunization: Secondary | ICD-10-CM

## 2023-07-11 DIAGNOSIS — E1159 Type 2 diabetes mellitus with other circulatory complications: Secondary | ICD-10-CM | POA: Diagnosis not present

## 2023-07-11 DIAGNOSIS — E785 Hyperlipidemia, unspecified: Secondary | ICD-10-CM | POA: Diagnosis not present

## 2023-07-11 DIAGNOSIS — E039 Hypothyroidism, unspecified: Secondary | ICD-10-CM

## 2023-07-11 DIAGNOSIS — I152 Hypertension secondary to endocrine disorders: Secondary | ICD-10-CM

## 2023-07-11 LAB — POCT GLYCOSYLATED HEMOGLOBIN (HGB A1C): HbA1c, POC (controlled diabetic range): 6.5 % (ref 0.0–7.0)

## 2023-07-11 LAB — GLUCOSE, POCT (MANUAL RESULT ENTRY): POC Glucose: 129 mg/dl — AB (ref 70–99)

## 2023-07-11 NOTE — Progress Notes (Signed)
Patient ID: [name], female    DOB: 1961-04-23  MRN: [id number]  CC: Diabetes (DM & HTN f/u./Reports did not receive cologuard kit Valentino Hue to flu vax)   Subjective: [name] is a 62 y.o. female who presents for chronic ds management. Her concerns today include:  HTN, HL, depression, hypothyroid, DM, obesity, tension HA, chronic LBP (minimal degen disc ds on MRI).    HM: yes to flu shot.  Due for COVID-19 booster, plans to get at her pharmacy.  Given FIT kit.  Had misplaced it but found it. Plans to turn in next wk  DM: Results for orders placed or performed in visit on 07/11/23  POCT glycosylated hemoglobin (Hb A1C)  Result Value Ref Range   Hemoglobin A1C     HbA1c POC (<> result, manual entry)     HbA1c, POC (prediabetic range)     HbA1c, POC (controlled diabetic range) 6.5 0.0 - 7.0 %  POCT glucose (manual entry)  Result Value Ref Range   POC Glucose 129 (A) 70 - 99 mg/dl  Still taking and tolerating Trulicity 1.5 mg once a wk.  No longer on Lantus 5 units Reports appetite goes and comes.  Can do better with staying away from sugar stuff  HTN:  reports compliance with Norvasc 10 mg, Lisinopril 20 mg daily and Inderal 80 mg BID Limits salt.  No chest pains or shortness of breath.  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   Brain atrophy (HCC) 12/18/2022   Complete tear of right rotator cuff 05/16/2022   Adhesive capsulitis of right shoulder 03/14/2022   Influenza vaccine needed 10/31/2020   Need for diphtheria-tetanus-pertussis (Tdap) vaccine 10/31/2020   Hyperlipidemia associated with type 2 diabetes mellitus (HCC) 10/31/2020   Overweight (BMI 25.0-29.9) 10/31/2020   Type 2 diabetes mellitus without complication, without long-term current use of insulin (HCC) 07/31/2020   OSA (obstructive sleep apnea) 02/02/2020    Spondylosis without myelopathy or radiculopathy, lumbar region 09/17/2017   Essential hypertension 09/05/2017   Chronic bilateral low back pain with left-sided sciatica 09/05/2017   HTN (hypertension) 05/05/2017   Hypothyroidism 02/24/2017   Mixed hyperlipidemia 02/24/2017   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   Sleep related headaches 01/23/2017   Snoring 01/23/2017   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   Chronic headaches 10/30/2016   Obesity (BMI 30.0-34.9) 05/24/2016   PMB (postmenopausal bleeding) 05/24/2016   Back pain of lumbar region with sciatica 09/28/2015     Current Outpatient Medications on File Prior to Visit  Medication Sig Dispense Refill   Accu-Chek FastClix Lancets MISC Use as instructed to check blood sugar three times daily 102 each 6   ACCU-CHEK GUIDE test strip USE TO test THREE TIMES DAILY 100 strip 6   Accu-Chek Softclix Lancets lancets Use as instructed to check blood sugar three times daily 100 each 6   amitriptyline (ELAVIL) 50 MG tablet Take 50 mg by mouth at bedtime.     amLODipine (NORVASC) 10 MG tablet Take 1 tablet (10 mg total) by mouth daily. 30 tablet 0   Blood Glucose Monitoring Suppl (ACCU-CHEK GUIDE ME) w/Device KIT 1 kit by Does not apply route daily. Use as instructed to check blood sugar daily. E11.9 1 kit 0   busPIRone (BUSPAR) 10 MG tablet Take 10 mg by mouth 3 (three) times daily.  DULoxetine (CYMBALTA) 30 MG capsule Take 1 capsule (30 mg total) by mouth daily. Take 1 tablet (30 mg) twice a day. (Patient taking differently: Take 30 mg by mouth 2 (two) times daily.) 90 capsule 1   levothyroxine (SYNTHROID) 50 MCG tablet Take 1 tablet (50 mcg total) by mouth every morning. 30 tablet 0   lisinopril (ZESTRIL) 20 MG tablet Take 1 tablet (20 mg total) by mouth daily. 90 tablet 0   omeprazole (PRILOSEC) 20 MG capsule TAKE ONE CAPSULE BY MOUTH EVERY DAY 90 capsule 0   propranolol (INDERAL) 80 MG tablet Take 1 tablet (80 mg total) by mouth 2  (two) times daily. 180 tablet 0   TRULICITY 1.5 MG/0.5ML SOPN INJECT 1.5MG  INTO THE SKIN Once weekly 2 mL 6   No current facility-administered medications on file prior to visit.    Allergies  Allergen Reactions   Iron Diarrhea   Metformin And Related Nausea And Vomiting   Penicillins Nausea And Vomiting    Social History   Socioeconomic History   Marital status: Single    Spouse name: Not on file   Number of children: 1   Years of education: 80   Highest education level: 12th grade  Occupational History   Occupation: Unemployed  Tobacco Use   Smoking status: Never   Smokeless tobacco: Never  Vaping Use   Vaping status: Never Used  Substance and Sexual Activity   Alcohol use: No    Alcohol/week: 0.0 standard drinks of alcohol   Drug use: No   Sexual activity: Not Currently    Partners: Male  Other Topics Concern   Not on file  Social History Narrative   Lives with son, Onalee Hua.   Caffeine use: Daily       Left handed   Social Determinants of Health   Financial Resource Strain: Low Risk  (02/12/2023)   Overall Financial Resource Strain (CARDIA)    Difficulty of Paying Living Expenses: Not hard at all  Food Insecurity: No Food Insecurity (04/07/2023)   Hunger Vital Sign    Worried About Running Out of Food in the Last Year: Never true    Ran Out of Food in the Last Year: Never true  Recent Concern: Food Insecurity - Food Insecurity Present (02/12/2023)   Hunger Vital Sign    Worried About Running Out of Food in the Last Year: Sometimes true    Ran Out of Food in the Last Year: Sometimes true  Transportation Needs: No Transportation Needs (02/12/2023)   PRAPARE - Administrator, Civil Service (Medical): No    Lack of Transportation (Non-Medical): No  Physical Activity: Unknown (02/12/2023)   Exercise Vital Sign    Days of Exercise per Week: 2 days    Minutes of Exercise per Session: Patient declined  Stress: Stress Concern Present (02/12/2023)   [employer] of Occupational Health - Occupational Stress Questionnaire    Feeling of Stress : To some extent  Social Connections: Unknown (02/12/2023)   Social Connection and Isolation Panel [NHANES]    Frequency of Communication with Friends and Family: Twice a week    Frequency of Social Gatherings with Friends and Family: Never    Attends Religious Services: Never    Database administrator or Organizations: No    Attends Engineer, structural: Not on file    Marital Status: Patient declined  Intimate Partner Violence: Not on file    Family History  Problem Relation Age of Onset  Headache Neg Hx    Colon cancer Neg Hx    Esophageal cancer Neg Hx    Rectal cancer Neg Hx    Stomach cancer Neg Hx    Breast cancer Neg Hx     Past Surgical History:  Procedure Laterality Date   ANTERIOR CERVICAL DECOMP/DISCECTOMY FUSION N/A 03/11/2023   Procedure: C4-5, C5-6, C6-7 ANTERIOR CERVICAL DECOMPRESSION/DISCECTOMY FUSION 3 LEVELS;  Surgeon: London Sheer, MD;  Location: [facility] OR;  Service: Orthopedics;  Laterality: N/A;   BREAST CYST EXCISION Right 1980   BREAST CYST EXCISION Left 1985   NO PAST SURGERIES      ROS: Review of Systems Negative except as stated above  PHYSICAL EXAM: BP 110/80   Pulse 84   Temp 98.2 F (36.8 C) (Oral)   Ht 5\' 7"  (1.702 m)   Wt 183 lb (83 kg)   LMP 06/06/2015   SpO2 96%   BMI 28.66 kg/m   Wt Readings from Last 3 Encounters:  07/11/23 183 lb (83 kg)  06/12/23 184 lb (83.5 kg)  03/11/23 186 lb (84.4 kg)    Physical Exam  General appearance - alert, well appearing, and in no distress Mental status - normal mood, behavior, speech, dress, motor activity, and thought processes Chest - clear to auscultation, no wheezes, rales or rhonchi, symmetric air entry Heart - normal rate, regular rhythm, normal S1, S2, no murmurs, rubs, clicks or gallops Extremities - peripheral pulses normal, no pedal edema, no clubbing or cyanosis MSK: Gait is  wide-based and slow.  She walks with her trunk slightly flexed forward.     Latest Ref Rng & Units 03/12/2023    6:33 AM 03/04/2023    9:31 AM 12/30/2022    4:35 PM  CMP  Glucose 70 - 99 mg/dL 161  096  045   BUN 8 - 23 mg/dL 14  12  17    Creatinine 0.44 - 1.00 mg/dL 4.09  8.11  9.14   Sodium 135 - 145 mmol/L 134  136  138   Potassium 3.5 - 5.1 mmol/L 4.2  4.3  4.4   Chloride 98 - 111 mmol/L 101  102  102   CO2 22 - 32 mmol/L 22  24  24    Calcium 8.9 - 10.3 mg/dL 8.7  9.4  9.6   Total Protein 6.5 - 8.1 g/dL   8.6   Total Bilirubin 0.3 - 1.2 mg/dL   0.7   Alkaline Phos 38 - 126 U/L   114   AST 15 - 41 U/L   24   ALT 0 - 44 U/L   27    Lipid Panel     Component Value Date/Time   CHOL 150 05/20/2022 1148   TRIG 139 05/20/2022 1148   HDL 34 (L) 05/20/2022 1148   CHOLHDL 4.4 05/20/2022 1148   CHOLHDL 9.2 (H) 09/28/2015 1420   VLDL 61 (H) 09/28/2015 1420   LDLCALC 91 05/20/2022 1148    CBC    Component Value Date/Time   WBC 16.1 (H) 03/12/2023 0633   RBC 3.98 03/12/2023 0633   HGB 12.6 03/12/2023 0633   HGB 15.1 02/13/2023 1548   HCT 37.3 03/12/2023 0633   HCT 43.8 02/13/2023 1548   PLT 246 03/12/2023 0633   PLT 302 08/28/2021 0931   MCV 93.7 03/12/2023 0633   MCV 93 02/13/2023 1548   MCH 31.7 03/12/2023 0633   MCHC 33.8 03/12/2023 0633   RDW 13.0 03/12/2023 0633   RDW 13.5  02/13/2023 1548   LYMPHSABS 3.7 (H) 02/13/2023 1548   MONOABS 0.9 12/30/2022 1635   EOSABS 0.1 02/13/2023 1548   BASOSABS 0.1 02/13/2023 1548    ASSESSMENT AND PLAN: 1. Type 2 diabetes mellitus with hyperlipidemia (HCC) At goal.  Continue Trulicity 1.5 mg once a week.  Encouraged her to choose healthier snacks. - POCT glycosylated hemoglobin (Hb A1C) - POCT glucose (manual entry) - Lipid panel - Comprehensive metabolic panel  2. Hypothyroidism (acquired) Continue levothyroxine. - TSH  3. Hypertension associated with type 2 diabetes mellitus (HCC) At goal.  Continue lisinopril 20 mg,  Norvasc 10 mg and Inderal 80 mg twice a day.  4. Encounter for immunization - Flu vaccine trivalent PF, 6mos and older(Flulaval,Afluria,Fluarix,Fluzone)   Patient was given the opportunity to ask questions.  Patient verbalized understanding of the plan and was able to repeat key elements of the plan.   This documentation was completed using Paediatric nurse.  Any transcriptional errors are unintentional.  Orders Placed This Encounter  Procedures   Flu vaccine trivalent PF, 6mos and older(Flulaval,Afluria,Fluarix,Fluzone)   TSH   Lipid panel   Comprehensive metabolic panel   POCT glycosylated hemoglobin (Hb A1C)   POCT glucose (manual entry)     Requested Prescriptions    No prescriptions requested or ordered in this encounter    Return in about 4 months (around 11/10/2023).  Jonah Blue, MD, FACP

## 2023-07-12 LAB — LIPID PANEL
Chol/HDL Ratio: 4.5 ratio — ABNORMAL HIGH (ref 0.0–4.4)
Cholesterol, Total: 190 mg/dL (ref 100–199)
HDL: 42 mg/dL (ref >39)
LDL Chol Calc (NIH): 123 mg/dL — ABNORMAL HIGH (ref 0–99)
Triglycerides: 138 mg/dL (ref 0–149)
VLDL Cholesterol Cal: 25 mg/dL (ref 5–40)

## 2023-07-12 LAB — TSH: TSH: 1.15 u[IU]/mL (ref 0.450–4.500)

## 2023-07-12 LAB — COMPREHENSIVE METABOLIC PANEL
ALT: 46 IU/L — ABNORMAL HIGH (ref 0–32)
AST: 24 IU/L (ref 0–40)
Albumin: 4.9 g/dL (ref 3.9–4.9)
Alkaline Phosphatase: 119 IU/L (ref 44–121)
BUN/Creatinine Ratio: 18 (ref 12–28)
BUN: 15 mg/dL (ref 8–27)
Bilirubin Total: 0.6 mg/dL (ref 0.0–1.2)
CO2: 20 mmol/L (ref 20–29)
Calcium: 9.5 mg/dL (ref 8.7–10.3)
Chloride: 102 mmol/L (ref 96–106)
Creatinine, Ser: 0.85 mg/dL (ref 0.57–1.00)
Globulin, Total: 3.2 g/dL (ref 1.5–4.5)
Glucose: 121 mg/dL — ABNORMAL HIGH (ref 70–99)
Potassium: 4.8 mmol/L (ref 3.5–5.2)
Sodium: 138 mmol/L (ref 134–144)
Total Protein: 8.1 g/dL (ref 6.0–8.5)
eGFR: 78 mL/min/{1.73_m2} (ref >59)

## 2023-07-15 DIAGNOSIS — F330 Major depressive disorder, recurrent, mild: Secondary | ICD-10-CM | POA: Diagnosis not present

## 2023-07-17 ENCOUNTER — Other Ambulatory Visit: Payer: Self-pay | Admitting: Orthopedic Surgery

## 2023-07-30 DIAGNOSIS — Z1211 Encounter for screening for malignant neoplasm of colon: Secondary | ICD-10-CM | POA: Diagnosis not present

## 2023-08-01 LAB — FECAL OCCULT BLOOD, IMMUNOCHEMICAL: Fecal Occult Bld: NEGATIVE

## 2023-08-04 ENCOUNTER — Encounter: Payer: Self-pay | Admitting: Orthopedic Surgery

## 2023-08-13 ENCOUNTER — Telehealth: Payer: Self-pay

## 2023-08-13 NOTE — Telephone Encounter (Signed)
Copied from CRM [id number]. Topic: Referral - Request for Referral >> Aug 12, 2023 10:31 AM Patsy Lager T wrote: Has patient seen PCP for this complaint? Yes.   Referral for which specialty: [facility] Preferred provider/office: [facility] on [address] Reason for referral: headaches

## 2023-08-14 DIAGNOSIS — F330 Major depressive disorder, recurrent, mild: Secondary | ICD-10-CM | POA: Diagnosis not present

## 2023-08-18 DIAGNOSIS — H538 Other visual disturbances: Secondary | ICD-10-CM | POA: Diagnosis not present

## 2023-09-01 NOTE — Telephone Encounter (Signed)
Okay for repeat injection with me or Dr August Saucer.  Other option would be rpeeat MRI scan but I think needs re-eval first for inj vs MRI

## 2023-09-02 ENCOUNTER — Other Ambulatory Visit: Payer: Self-pay | Admitting: Orthopedic Surgery

## 2023-09-10 DIAGNOSIS — F413 Other mixed anxiety disorders: Secondary | ICD-10-CM | POA: Diagnosis not present

## 2023-09-10 DIAGNOSIS — F330 Major depressive disorder, recurrent, mild: Secondary | ICD-10-CM | POA: Diagnosis not present

## 2023-09-10 DIAGNOSIS — F331 Major depressive disorder, recurrent, moderate: Secondary | ICD-10-CM | POA: Diagnosis not present

## 2023-09-11 ENCOUNTER — Ambulatory Visit: Payer: 59 | Admitting: Orthopedic Surgery

## 2023-09-17 ENCOUNTER — Other Ambulatory Visit: Payer: Self-pay | Admitting: Internal Medicine

## 2023-09-17 NOTE — Telephone Encounter (Signed)
Requested by interface surscripts. Future visit in 2 months . Requested Prescriptions  Pending Prescriptions Disp Refills   TRULICITY 1.5 MG/0.5ML SOAJ [Pharmacy Med Name: Trulicity 1.5 mg/0.5 mL subcutaneous pen injector] 2 mL 6    Sig: INJECT 1.5mg  SUBCUTANEOUSLY Once weekly     Endocrinology:  Diabetes - GLP-1 Receptor Agonists Passed - 09/17/2023 12:33 PM      Passed - HBA1C is between 0 and 7.9 and within 180 days    HbA1c, POC (prediabetic range)  Date Value Ref Range Status  05/20/2022 6.3 5.7 - 6.4 % Final   HbA1c, POC (controlled diabetic range)  Date Value Ref Range Status  07/11/2023 6.5 0.0 - 7.0 % Final         Passed - Valid encounter within last 6 months    Recent Outpatient Visits           2 months ago Type 2 diabetes mellitus with hyperlipidemia (HCC)   [facility] [place]. [facility] Marcine Matar, MD   7 months ago Preoperative evaluation to rule out surgical contraindication   [facility] - [facility] Of [place]. [facility] Jonah Blue B, MD   9 months ago Type 2 diabetes mellitus with hyperlipidemia [facility])   [facility] Of [place]. [facility] Marcine Matar, MD   1 year ago Diabetes mellitus type 2 in obese [facility])   [facility] - [facility] Dept Of [place]. [facility] Marcine Matar, MD   1 year ago Diabetes mellitus type 2 in obese [facility])   [facility] - A Dept Of . [facility] Marcine Matar, MD       Future Appointments             In 1 week Christell Constant Tyrone Apple, MD [facility]   In 2 months Marcine Matar, MD [facility] - A Dept Of [place]igha Brideg[facility]. [facility]

## 2023-09-22 NOTE — Telephone Encounter (Signed)
Hey Autumn, can you call Clarina and see if she wants to make an appointment?  Thank you

## 2023-09-24 ENCOUNTER — Ambulatory Visit: Payer: 59 | Admitting: Orthopedic Surgery

## 2023-09-24 DIAGNOSIS — F331 Major depressive disorder, recurrent, moderate: Secondary | ICD-10-CM | POA: Diagnosis not present

## 2023-09-24 DIAGNOSIS — F413 Other mixed anxiety disorders: Secondary | ICD-10-CM | POA: Diagnosis not present

## 2023-09-25 ENCOUNTER — Other Ambulatory Visit (INDEPENDENT_AMBULATORY_CARE_PROVIDER_SITE_OTHER): Payer: Self-pay

## 2023-09-25 ENCOUNTER — Ambulatory Visit: Payer: 59 | Admitting: Orthopedic Surgery

## 2023-09-25 DIAGNOSIS — Z981 Arthrodesis status: Secondary | ICD-10-CM

## 2023-09-25 NOTE — Progress Notes (Signed)
Orthopedic Surgery Post-operative Office Visit   Procedure: C4-7 anterior cervical discectomy and fusion Date of Surgery: 03/11/2023 (~6 months post-op)   Assessment: Patient is a 62 y.o. female who had dysphagia post-operative but that has resolved. Doing well post-operatively     Plan: -Operative plans complete -Activity as tolerated -No spine specific restrictions at this time -Pain management: tylenol -Return to office in 6 months, films needed at next visit: AP/lateral/flex/ex cervical   ___________________________________________________________________________     Subjective: Has some pain on the left side of her neck in the area of the SCM.  It hurts worse when she looks to the left.  She is not having any pain radiating into either upper extremity.  She is no longer having any dysphagia.  She is satisfied with her surgical outcome at this point.     Objective:   General: no acute distress, appropriate affect Neurologic: alert, answering questions appropriately, following commands Respiratory: unlabored breathing on room air Skin: incision is well healed   MSK (spine):   -Strength exam                                                   Left                  Right Grip strength                5/5                  5/5 Interosseus                  5/5                  5/5 Wrist extension            5/5                  5/5 Wrist flexion                 5/5                  5/5 Elbow flexion                5/5                  5/5 Deltoid                          5/5                  4+/5  -Sensory exam                          Sensation intact to light touch in C5-T1 nerve distributions of bilateral upper extremities     Imaging: X-rays of the cervical spine taken 09/25/2023 were independently reviewed and interpreted, showing allograft interbody spacers in appropriate position at C4/5, C5/6, C6/7. There is anterior instrumentation from C4-7. No lucency seen around  the screws. No fracture or dislocation seen.      Patient name: [name] Patient MRN: [id number] Date of visit: 09/25/23

## 2023-09-26 ENCOUNTER — Ambulatory Visit (INDEPENDENT_AMBULATORY_CARE_PROVIDER_SITE_OTHER): Payer: 59 | Admitting: Surgical

## 2023-09-26 ENCOUNTER — Encounter: Payer: Self-pay | Admitting: Surgical

## 2023-09-26 DIAGNOSIS — M25511 Pain in right shoulder: Secondary | ICD-10-CM | POA: Diagnosis not present

## 2023-09-26 DIAGNOSIS — G8929 Other chronic pain: Secondary | ICD-10-CM

## 2023-09-27 ENCOUNTER — Encounter: Payer: Self-pay | Admitting: Surgical

## 2023-09-27 NOTE — Progress Notes (Signed)
Office Visit Note   Patient: [name]           Date of Birth: 28-Oct-1960           MRN: [id number] Visit Date: 09/26/2023 Requested by: Marcine Matar, MD [address] [place],  [place] [address] PCP: Marcine Matar, MD  Subjective: Chief Complaint  Patient presents with   Right Shoulder - Pain    HPI: [name] is a 62 y.o. female who presents to the office reporting right shoulder pain.  Patient has history of right shoulder pain with prior MRI without contrast from 2023 that demonstrated mild to moderate degenerative changes of the glenohumeral joint.  She has had good relief with occasional glenohumeral injections up into the last injection on 06/16/2023 that only provided relief for about 2 to 3 weeks.  She states that she has had return of pain that she mostly localizes to the anterior and lateral aspects of the shoulder with some radiation down to the mid humeral region.  She has no radiation past this area.  No scapular pain, neck pain, numbness/tingling.  Does not really note any significant weakness but the pain is bothersome and will occasionally wake her up from sleep at night.  She takes Tylenol with some relief..                ROS: All systems reviewed are negative as they relate to the chief complaint within the history of present illness.  Patient denies fevers or chills.  Assessment & Plan: Visit Diagnoses:  1. Chronic right shoulder pain     Plan: Patient is a 62 year old female who presents for evaluation of right shoulder pain.  She has history of right shoulder osteoarthritis as noted on MRI from 2023.  Looking back at that MRI scan she also has AC joint arthritis and there was some tendinosis in the "critical zone" of the supraspinatus without any evidence of tearing.  However with the lack of contrast, difficult to exclude any partial-thickness rotator cuff tear.  Additionally, axial view appears to demonstrate possible interstitial  tear of the long head of the bicep tendon.  After discussion of options, patient would like to avoid repeat injection and we will proceed with MRI arthrogram of the right shoulder for evaluation of bicep tendon split tear.  Also want to evaluate if there is any rotator cuff damage and current status of the glenohumeral degenerative changes.  Follow-up after MRI.  Follow-Up Instructions: No follow-ups on file.   Orders:  Orders Placed This Encounter  Procedures   Arthrogram   MR SHOULDER RIGHT W CONTRAST   No orders of the defined types were placed in this encounter.     Procedures: No procedures performed   Clinical Data: No additional findings.  Objective: Vital Signs: LMP 06/06/2015   Physical Exam:  Constitutional: Patient appears well-developed HEENT:  Head: Normocephalic Eyes:EOM are normal Neck: Normal range of motion Cardiovascular: Normal rate Pulmonary/chest: Effort normal Neurologic: Patient is alert Skin: Skin is warm Psychiatric: Patient has normal mood and affect  Ortho Exam: Ortho exam demonstrates right shoulder with 40 degrees external rotation, 80 degrees abduction, 160 degrees forward elevation both passively and actively.  She has axillary nerve intact with deltoid firing.  2+ radial pulse of the right upper extremity.  Intact EPL, FPL, finger abduction, pronation/supination, bicep, tricep, deltoid.  Excellent rotator cuff strength of supra, infra, subscap though infraspinatus strength testing does reproduce her pain.  She has tenderness over the bicipital groove.  Positive O'Brien sign.  Mild tenderness over the [facility] joint.  No significant pain with cervical spine range of motion.  Negative Spurling sign.  Specialty Comments:  MRI CERVICAL SPINE WITHOUT CONTRAST   TECHNIQUE: Multiplanar, multisequence MR imaging of the cervical spine was performed. No intravenous contrast was administered.   COMPARISON:  None Available.   FINDINGS: Alignment:  Physiologic.   Vertebrae: No acute fracture, evidence of discitis, or aggressive bone lesion.   Cord: Normal signal and morphology.   Posterior Fossa, vertebral arteries, paraspinal tissues: Posterior fossa demonstrates no focal abnormality. Vertebral artery flow voids are maintained. Paraspinal soft tissues are unremarkable.   Disc levels:   Discs: Degenerative disease with disc height loss at C4-5, C5-6 and C6-7.   C2-3: No disc protrusion. Mild right facet arthropathy. Moderate-severe right foraminal stenosis. No left foraminal stenosis. No spinal stenosis.   C3-4: Mild broad-based disc bulge. Moderate left and mild right facet arthropathy. Moderate-severe left foraminal stenosis. Mild right foraminal stenosis. No spinal stenosis.   C4-5: Broad left paracentral disc protrusion contacting the left ventral paracentral cervical spinal cord. Moderate left foraminal stenosis. Mild right foraminal stenosis. Mild spinal stenosis.   C5-6: Mild broad-based disc bulge. Bilateral uncovertebral degenerative changes. Severe bilateral foraminal stenosis. Mild spinal stenosis.   C6-7: Broad-based disc bulge. Mild-moderate left foraminal stenosis. Mild right foraminal stenosis. No spinal stenosis.   C7-T1: Small central disc protrusion. No foraminal or central canal stenosis.   IMPRESSION: 1. Cervical spine spondylosis as described above. 2. No acute osseous injury of the cervical spine.     Electronically Signed   By: Elige Ko M.D.   On: 12/25/2022 09:13  Imaging: No results found.   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   Brain atrophy (HCC) 12/18/2022   Complete tear of right rotator cuff 05/16/2022   Adhesive capsulitis of right shoulder 03/14/2022   Influenza vaccine needed 10/31/2020   Need for diphtheria-tetanus-pertussis (Tdap) vaccine 10/31/2020   Hyperlipidemia associated with type 2 diabetes  mellitus (HCC) 10/31/2020   Overweight (BMI 25.0-29.9) 10/31/2020   Type 2 diabetes mellitus without complication, without long-term current use of insulin (HCC) 07/31/2020   OSA (obstructive sleep apnea) 02/02/2020   Spondylosis without myelopathy or radiculopathy, lumbar region 09/17/2017   Essential hypertension 09/05/2017   Chronic bilateral low back pain with left-sided sciatica 09/05/2017   HTN (hypertension) 05/05/2017   Hypothyroidism 02/24/2017   Mixed hyperlipidemia 02/24/2017   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   Sleep related headaches 01/23/2017   Snoring 01/23/2017   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   Chronic headaches 10/30/2016   Obesity (BMI 30.0-34.9) 05/24/2016   PMB (postmenopausal bleeding) 05/24/2016   Back pain of lumbar region with sciatica 09/28/2015   Past Medical History:  Diagnosis Date   Anxiety    Arthritis    Depression    Diabetes mellitus without complication (HCC)    GERD (gastroesophageal reflux disease)    Hyperlipidemia    Hypertension    Migraines    Thyroid disease     Family History  Problem Relation Age of Onset   Headache Neg Hx    Colon cancer Neg Hx    Esophageal cancer Neg Hx    Rectal cancer Neg Hx    Stomach cancer Neg Hx    Breast cancer Neg Hx     Past Surgical History:  Procedure Laterality Date   ANTERIOR  CERVICAL DECOMP/DISCECTOMY FUSION N/A 03/11/2023   Procedure: C4-5, C5-6, C6-7 ANTERIOR CERVICAL DECOMPRESSION/DISCECTOMY FUSION 3 LEVELS;  Surgeon: London Sheer, MD;  Location: [facility] OR;  Service: Orthopedics;  Laterality: N/A;   BREAST CYST EXCISION Right 1980   BREAST CYST EXCISION Left 1985   NO PAST SURGERIES     Social History   Occupational History   Occupation: Unemployed  Tobacco Use   Smoking status: Never   Smokeless tobacco: Never  Vaping Use   Vaping status: Never Used  Substance and Sexual Activity   Alcohol use: No    Alcohol/week: 0.0 standard drinks of alcohol   Drug use: No    Sexual activity: Not Currently    Partners: Male

## 2023-10-01 DIAGNOSIS — F330 Major depressive disorder, recurrent, mild: Secondary | ICD-10-CM | POA: Diagnosis not present

## 2023-10-13 DIAGNOSIS — F3181 Bipolar II disorder: Secondary | ICD-10-CM | POA: Diagnosis not present

## 2023-10-13 DIAGNOSIS — F413 Other mixed anxiety disorders: Secondary | ICD-10-CM | POA: Diagnosis not present

## 2023-10-21 ENCOUNTER — Other Ambulatory Visit: Payer: Self-pay | Admitting: Internal Medicine

## 2023-10-21 ENCOUNTER — Other Ambulatory Visit: Payer: Self-pay | Admitting: Orthopedic Surgery

## 2023-10-21 DIAGNOSIS — I152 Hypertension secondary to endocrine disorders: Secondary | ICD-10-CM

## 2023-10-24 ENCOUNTER — Ambulatory Visit (INDEPENDENT_AMBULATORY_CARE_PROVIDER_SITE_OTHER): Payer: 59 | Admitting: Surgical

## 2023-10-24 ENCOUNTER — Encounter: Payer: Self-pay | Admitting: Surgical

## 2023-10-24 DIAGNOSIS — M19011 Primary osteoarthritis, right shoulder: Secondary | ICD-10-CM

## 2023-10-24 NOTE — Progress Notes (Signed)
 Follow-up Office Visit Note   Patient: [name]           Date of Birth: 31-Jul-1961           MRN: [id number] Visit Date: 10/24/2023 Requested by: Shirly Carlin CROME, PA-C [address] [place]  [place],  [place] [address] PCP: Vicci Barnie NOVAK, MD  Subjective: Chief Complaint  Patient presents with   Right Shoulder - Pain    HPI: [name] is a 63 y.o. female who returns to the office for follow-up visit.    Plan at last visit was: Patient is a 63 year old female who presents for evaluation of right shoulder pain. She has history of right shoulder osteoarthritis as noted on MRI from 2023. Looking back at that MRI scan she also has AC joint arthritis and there was some tendinosis in the critical zone of the supraspinatus without any evidence of tearing. However with the lack of contrast, difficult to exclude any partial-thickness rotator cuff tear. Additionally, axial view appears to demonstrate possible interstitial tear of the long head of the bicep tendon. After discussion of options, patient would like to avoid repeat injection and we will proceed with MRI arthrogram of the right shoulder for evaluation of bicep tendon split tear. Also want to evaluate if there is any rotator cuff damage and current status of the glenohumeral degenerative changes. Follow-up after MRI.   Since then, patient notes continued right shoulder pain.  Currently has MRI arthrogram ordered for 11/12/2023.  Notes worsening pain primarily with range of motion.  Does not have pain at rest.  Has difficulty putting her jacket on due to pain.  Has tried heating pad, Tylenol , IcyHot with some relief but it is short-lived.  Cannot take NSAIDs due to the effect on her blood pressure.  She denies any new injury.              ROS: All systems reviewed are negative as they relate to the chief complaint within the history of present illness.  Patient denies fevers or chills.  Assessment & Plan: Visit Diagnoses:  1.  Arthritis of right glenohumeral joint     Plan: [name] is a 63 y.o. female who returns to the office for follow-up visit.  Plan from last visit was noted above in HPI.  They now return with continued right shoulder pain.  Right shoulder pain is slightly worse than at her last visit.  Currently has MRI scheduled for 11/12/2023.  Think that the best next step for this shoulder is to continue with plan for MRI scan.  We will hold off on injection today just in case there is any operative pathology noted on scan.  We did opt for ultrasound examination of the shoulder today which did demonstrate some tendinosis of the supraspinatus with very mild cortical irregularity of the distal edge of the supraspinatus tendon posteriorly.  Also noted was what appears to be split-tear of the long head of the bicep tendon.  Bicep tendon was present in the bicipital groove with no dislocation with dynamic exam while patient's shoulder was externally rotated.  We will plan for patient to try lidocaine  patches and topical Voltaren gel applied over the bicipital groove area and follow-up after MRI to review results.  Follow-Up Instructions: No follow-ups on file.   Orders:  No orders of the defined types were placed in this encounter.  No orders of the defined types were placed in this encounter.     Procedures: No procedures  performed   Clinical Data: No additional findings.  Objective: Vital Signs: LMP 06/06/2015   Physical Exam:  Constitutional: Patient appears well-developed HEENT:  Head: Normocephalic Eyes:EOM are normal Neck: Normal range of motion Cardiovascular: Normal rate Pulmonary/chest: Effort normal Neurologic: Patient is alert Skin: Skin is warm Psychiatric: Patient has normal mood and affect  Ortho Exam: Ortho exam demonstrates right shoulder without any cellulitis or skin changes.  Does have tenderness over the bicipital groove.  2+ radial pulse of the right upper  extremity.  Specialty Comments:  MRI CERVICAL SPINE WITHOUT CONTRAST   TECHNIQUE: Multiplanar, multisequence MR imaging of the cervical spine was performed. No intravenous contrast was administered.   COMPARISON:  None Available.   FINDINGS: Alignment: Physiologic.   Vertebrae: No acute fracture, evidence of discitis, or aggressive bone lesion.   Cord: Normal signal and morphology.   Posterior Fossa, vertebral arteries, paraspinal tissues: Posterior fossa demonstrates no focal abnormality. Vertebral artery flow voids are maintained. Paraspinal soft tissues are unremarkable.   Disc levels:   Discs: Degenerative disease with disc height loss at C4-5, C5-6 and C6-7.   C2-3: No disc protrusion. Mild right facet arthropathy. Moderate-severe right foraminal stenosis. No left foraminal stenosis. No spinal stenosis.   C3-4: Mild broad-based disc bulge. Moderate left and mild right facet arthropathy. Moderate-severe left foraminal stenosis. Mild right foraminal stenosis. No spinal stenosis.   C4-5: Broad left paracentral disc protrusion contacting the left ventral paracentral cervical spinal cord. Moderate left foraminal stenosis. Mild right foraminal stenosis. Mild spinal stenosis.   C5-6: Mild broad-based disc bulge. Bilateral uncovertebral degenerative changes. Severe bilateral foraminal stenosis. Mild spinal stenosis.   C6-7: Broad-based disc bulge. Mild-moderate left foraminal stenosis. Mild right foraminal stenosis. No spinal stenosis.   C7-T1: Small central disc protrusion. No foraminal or central canal stenosis.   IMPRESSION: 1. Cervical spine spondylosis as described above. 2. No acute osseous injury of the cervical spine.     Electronically Signed   By: Julaine Blanch M.D.   On: 12/25/2022 09:13  Imaging: No results found.   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   Brain atrophy (HCC)  12/18/2022   Complete tear of right rotator cuff 05/16/2022   Adhesive capsulitis of right shoulder 03/14/2022   Influenza vaccine needed 10/31/2020   Need for diphtheria-tetanus-pertussis (Tdap) vaccine 10/31/2020   Hyperlipidemia associated with type 2 diabetes mellitus (HCC) 10/31/2020   Overweight (BMI 25.0-29.9) 10/31/2020   Type 2 diabetes mellitus without complication, without long-term current use of insulin  (HCC) 07/31/2020   OSA (obstructive sleep apnea) 02/02/2020   Spondylosis without myelopathy or radiculopathy, lumbar region 09/17/2017   Essential hypertension 09/05/2017   Chronic bilateral low back pain with left-sided sciatica 09/05/2017   HTN (hypertension) 05/05/2017   Hypothyroidism 02/24/2017   Mixed hyperlipidemia 02/24/2017   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   Sleep related headaches 01/23/2017   Snoring 01/23/2017   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   Chronic headaches 10/30/2016   Obesity (BMI 30.0-34.9) 05/24/2016   PMB (postmenopausal bleeding) 05/24/2016   Back pain of lumbar region with sciatica 09/28/2015   Past Medical History:  Diagnosis Date   Anxiety    Arthritis    Depression    Diabetes mellitus without complication (HCC)    GERD (gastroesophageal reflux disease)    Hyperlipidemia    Hypertension    Migraines    Thyroid  disease     Family History  Problem Relation Age of  Onset   Headache Neg Hx    Colon cancer Neg Hx    Esophageal cancer Neg Hx    Rectal cancer Neg Hx    Stomach cancer Neg Hx    Breast cancer Neg Hx     Past Surgical History:  Procedure Laterality Date   ANTERIOR CERVICAL DECOMP/DISCECTOMY FUSION N/A 03/11/2023   Procedure: C4-5, C5-6, C6-7 ANTERIOR CERVICAL DECOMPRESSION/DISCECTOMY FUSION 3 LEVELS;  Surgeon: Georgina Ozell LABOR, MD;  Location: [facility] OR;  Service: Orthopedics;  Laterality: N/A;   BREAST CYST EXCISION Right 1980   BREAST CYST EXCISION Left 1985   NO PAST SURGERIES     Social History    Occupational History   Occupation: Unemployed  Tobacco Use   Smoking status: Never   Smokeless tobacco: Never  Vaping Use   Vaping status: Never Used  Substance and Sexual Activity   Alcohol use: No    Alcohol/week: 0.0 standard drinks of alcohol   Drug use: No   Sexual activity: Not Currently    Partners: Male

## 2023-10-27 ENCOUNTER — Ambulatory Visit: Payer: 59 | Admitting: Surgical

## 2023-11-05 ENCOUNTER — Other Ambulatory Visit: Payer: Self-pay | Admitting: Internal Medicine

## 2023-11-05 DIAGNOSIS — E1169 Type 2 diabetes mellitus with other specified complication: Secondary | ICD-10-CM

## 2023-11-06 ENCOUNTER — Encounter: Payer: Self-pay | Admitting: Family Medicine

## 2023-11-06 NOTE — Telephone Encounter (Signed)
Called and left vm for pt to return my call. Pt has [employer] which means she can not get the scan done at [facility] imaging due to being OON.

## 2023-11-06 NOTE — Telephone Encounter (Signed)
Called and left message for pt to return my call

## 2023-11-07 ENCOUNTER — Telehealth: Payer: Self-pay | Admitting: *Deleted

## 2023-11-07 NOTE — Telephone Encounter (Signed)
Contacted regarding PREP Class referral. She expressed interest previously for a class in early 2025. Left voice message to return my call for more information.

## 2023-11-10 ENCOUNTER — Other Ambulatory Visit: Payer: Self-pay | Admitting: Internal Medicine

## 2023-11-10 DIAGNOSIS — E119 Type 2 diabetes mellitus without complications: Secondary | ICD-10-CM

## 2023-11-11 DIAGNOSIS — F3181 Bipolar II disorder: Secondary | ICD-10-CM | POA: Diagnosis not present

## 2023-11-11 DIAGNOSIS — F413 Other mixed anxiety disorders: Secondary | ICD-10-CM | POA: Diagnosis not present

## 2023-11-12 ENCOUNTER — Other Ambulatory Visit: Payer: 59

## 2023-11-17 ENCOUNTER — Encounter: Payer: Self-pay | Admitting: Internal Medicine

## 2023-11-17 ENCOUNTER — Ambulatory Visit: Payer: Medicaid Other | Attending: Internal Medicine | Admitting: Internal Medicine

## 2023-11-17 VITALS — BP 124/79 | HR 75 | Temp 97.9°F | Ht 67.0 in | Wt 187.0 lb

## 2023-11-17 DIAGNOSIS — E039 Hypothyroidism, unspecified: Secondary | ICD-10-CM | POA: Diagnosis not present

## 2023-11-17 DIAGNOSIS — E1159 Type 2 diabetes mellitus with other circulatory complications: Secondary | ICD-10-CM

## 2023-11-17 DIAGNOSIS — I152 Hypertension secondary to endocrine disorders: Secondary | ICD-10-CM

## 2023-11-17 DIAGNOSIS — E1169 Type 2 diabetes mellitus with other specified complication: Secondary | ICD-10-CM | POA: Diagnosis not present

## 2023-11-17 DIAGNOSIS — Z23 Encounter for immunization: Secondary | ICD-10-CM | POA: Diagnosis not present

## 2023-11-17 DIAGNOSIS — E785 Hyperlipidemia, unspecified: Secondary | ICD-10-CM

## 2023-11-17 DIAGNOSIS — Z7985 Long-term (current) use of injectable non-insulin antidiabetic drugs: Secondary | ICD-10-CM | POA: Diagnosis not present

## 2023-11-17 DIAGNOSIS — R519 Headache, unspecified: Secondary | ICD-10-CM

## 2023-11-17 LAB — POCT GLYCOSYLATED HEMOGLOBIN (HGB A1C): HbA1c, POC (controlled diabetic range): 6.6 % (ref 0.0–7.0)

## 2023-11-17 LAB — GLUCOSE, POCT (MANUAL RESULT ENTRY): POC Glucose: 111 mg/dL — AB (ref 70–99)

## 2023-11-17 MED ORDER — AMITRIPTYLINE HCL 50 MG PO TABS
50.0000 mg | ORAL_TABLET | Freq: Every day | ORAL | 1 refills | Status: AC
Start: 2023-11-17 — End: ?

## 2023-11-17 NOTE — Progress Notes (Signed)
Patient ID: [name], female    DOB: [birth year]  MRN: [id number]  CC: Diabetes (DM f/u. Med refill. /No questions / concerns/Yes to pneumonia vax)   Subjective: [name] is a 63 y.o. female who presents for chronic ds management. Her concerns today include:  HTN, HL, depression, hypothyroid, DM, obesity, tension HA, chronic LBP (minimal degen disc ds on MRI).    DM: Results for orders placed or performed in visit on 11/17/23  POCT glucose (manual entry)   Collection Time: 11/17/23  4:33 PM  Result Value Ref Range   POC Glucose 111 (A) 70 - 99 mg/dl  POCT glycosylated hemoglobin (Hb A1C)   Collection Time: 11/17/23  4:42 PM  Result Value Ref Range   Hemoglobin A1C     HbA1c POC (<> result, manual entry)     HbA1c, POC (prediabetic range)     HbA1c, POC (controlled diabetic range) 6.6 0.0 - 7.0 %  Still taking and tolerating Trulicity 1.5 mg once a wk.  Reports decrease appetite with Trulicity.  Did not over eat during the holidays. Has not exercise much recently due to cold weather HTN: Norvasc 10 mg, Lisinopril 20 mg daily and Inderal 80 mg BID  HL:  taking and tolerating Lipitor 80 mg.  Last LDL was 123. Hypothyroid:  taking Levothyroxine 50 mcg consistently.   Request neurology referral.  Gets HA at nights for years.  Interferes with sleep.  Starts at back of head as a squeezing sensation then spreads all over the head. Associated with blurred vision but better since getting new rxn glasses.  No N/V.  Headaches occur about 5 out of 7 nights.  They have not changed in intensity.  Takes Elavil at bedtime which helps sometimes.  Had MRI done back in 2018 for the same reason which showed some mild frontal and mild parietal cortical atrophy otherwise negative.  She had seen the neurologist Dr. 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   Brain atrophy (HCC) 12/18/2022   Complete tear of right rotator cuff 05/16/2022    Adhesive capsulitis of right shoulder 03/14/2022   Influenza vaccine needed 10/31/2020   Need for diphtheria-tetanus-pertussis (Tdap) vaccine 10/31/2020   Hyperlipidemia associated with type 2 diabetes mellitus (HCC) 10/31/2020   Overweight (BMI 25.0-29.9) 10/31/2020   Type 2 diabetes mellitus without complication, without long-term current use of insulin (HCC) 07/31/2020   OSA (obstructive sleep apnea) 02/02/2020   Spondylosis without myelopathy or radiculopathy, lumbar region 09/17/2017   Essential hypertension 09/05/2017   Chronic bilateral low back pain with left-sided sciatica 09/05/2017   HTN (hypertension) 05/05/2017   Hypothyroidism 02/24/2017   Mixed hyperlipidemia 02/24/2017   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   Sleep related headaches 01/23/2017   Snoring 01/23/2017   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   Chronic headaches 10/30/2016   Obesity (BMI 30.0-34.9) 05/24/2016   PMB (postmenopausal bleeding) 05/24/2016   Back pain of lumbar region with sciatica 09/28/2015     Current Outpatient Medications on File Prior to Visit  Medication Sig Dispense Refill   Accu-Chek FastClix Lancets MISC Use as instructed to check blood sugar three times daily 102 each 6   ACCU-CHEK GUIDE TEST test strip USE TO TEST THREE TIMES DAILY 100 strip 6   Accu-Chek Softclix Lancets lancets Use as instructed to check blood sugar three times daily 100 each 6   amLODipine (NORVASC) 10 MG tablet TAKE ONE TABLET BY MOUTH EVERY DAY  30 tablet 2   atorvastatin (LIPITOR) 80 MG tablet TAKE ONE TABLET BY MOUTH DAILY 30 tablet 0   Blood Glucose Monitoring Suppl (ACCU-CHEK GUIDE ME) w/Device KIT 1 kit by Does not apply route daily. Use as instructed to check blood sugar daily. E11.9 1 kit 0   busPIRone (BUSPAR) 10 MG tablet Take 10 mg by mouth 3 (three) times daily.     cyclobenzaprine (FLEXERIL) 10 MG tablet Take 1 tablet (10 mg total) by mouth 2 (two) times daily as needed (pain, muscle spasms). 60  tablet 1   DULoxetine (CYMBALTA) 30 MG capsule Take 1 capsule (30 mg total) by mouth daily. Take 1 tablet (30 mg) twice a day. (Patient taking differently: Take 30 mg by mouth 2 (two) times daily.) 90 capsule 1   levothyroxine (SYNTHROID) 50 MCG tablet TAKE ONE TABLET BY MOUTH EVERY MORNING 30 tablet 2   lisinopril (ZESTRIL) 20 MG tablet Take 1 tablet (20 mg total) by mouth daily. 90 tablet 0   omeprazole (PRILOSEC) 20 MG capsule TAKE ONE CAPSULE BY MOUTH EVERY DAY 90 capsule 0   propranolol (INDERAL) 80 MG tablet Take 1 tablet (80 mg total) by mouth 2 (two) times daily. 180 tablet 0   TRULICITY 1.5 MG/0.5ML SOAJ INJECT 1.5mg  SUBCUTANEOUSLY Once weekly 2 mL 6   No current facility-administered medications on file prior to visit.    Allergies  Allergen Reactions   Iron Diarrhea   Metformin And Related Nausea And Vomiting   Penicillins Nausea And Vomiting    Social History   Socioeconomic History   Marital status: Single    Spouse name: Not on file   Number of children: 1   Years of education: 37   Highest education level: 12th grade  Occupational History   Occupation: Unemployed  Tobacco Use   Smoking status: Never   Smokeless tobacco: Never  Vaping Use   Vaping status: Never Used  Substance and Sexual Activity   Alcohol use: No    Alcohol/week: 0.0 standard drinks of alcohol   Drug use: No   Sexual activity: Not Currently    Partners: Male  Other Topics Concern   Not on file  Social History Narrative   Lives with son, Onalee Hua.   Caffeine use: Daily       Left handed   Social Drivers of Health   Financial Resource Strain: Low Risk  (11/17/2023)   Overall Financial Resource Strain (CARDIA)    Difficulty of Paying Living Expenses: Not hard at all  Food Insecurity: No Food Insecurity (11/17/2023)   Hunger Vital Sign    Worried About Running Out of Food in the Last Year: Never true    Ran Out of Food in the Last Year: Never true  Transportation Needs: No Transportation  Needs (11/17/2023)   PRAPARE - Administrator, Civil Service (Medical): No    Lack of Transportation (Non-Medical): No  Recent Concern: Transportation Needs - Unmet Transportation Needs (11/13/2023)   PRAPARE - Transportation    Lack of Transportation (Medical): Yes    Lack of Transportation (Non-Medical): Yes  Physical Activity: Insufficiently Active (11/17/2023)   Exercise Vital Sign    Days of Exercise per Week: 2 days    Minutes of Exercise per Session: 30 min  Stress: Stress Concern Present (11/17/2023)   [employer] of Occupational Health - Occupational Stress Questionnaire    Feeling of Stress : Rather much  Social Connections: Socially Isolated (11/17/2023)   Social Connection and Isolation  Panel [NHANES]    Frequency of Communication with Friends and Family: Once a week    Frequency of Social Gatherings with Friends and Family: Once a week    Attends Religious Services: Never    Database administrator or Organizations: No    Attends Banker Meetings: Never    Marital Status: Separated  Intimate Partner Violence: Not At Risk (11/17/2023)   Humiliation, Afraid, Rape, and Kick questionnaire    Fear of Current or Ex-Partner: No    Emotionally Abused: No    Physically Abused: No    Sexually Abused: No    Family History  Problem Relation Age of Onset   Headache Neg Hx    Colon cancer Neg Hx    Esophageal cancer Neg Hx    Rectal cancer Neg Hx    Stomach cancer Neg Hx    Breast cancer Neg Hx     Past Surgical History:  Procedure Laterality Date   ANTERIOR CERVICAL DECOMP/DISCECTOMY FUSION N/A 03/11/2023   Procedure: C4-5, C5-6, C6-7 ANTERIOR CERVICAL DECOMPRESSION/DISCECTOMY FUSION 3 LEVELS;  Surgeon: London Sheer, MD;  Location: [facility] OR;  Service: Orthopedics;  Laterality: N/A;   BREAST CYST EXCISION Right 1980   BREAST CYST EXCISION Left 1985   NO PAST SURGERIES      ROS: Review of Systems Negative except as stated above  PHYSICAL  EXAM: BP 124/79 (BP Location: Left Arm, Patient Position: Sitting, Cuff Size: Large)   Pulse 75   Temp 97.9 F (36.6 C) (Oral)   Ht 5\' 7"  (1.702 m)   Wt 187 lb (84.8 kg)   LMP 06/06/2015   SpO2 96%   BMI 29.29 kg/m   Wt Readings from Last 3 Encounters:  11/17/23 187 lb (84.8 kg)  07/11/23 183 lb (83 kg)  06/12/23 184 lb (83.5 kg)    Physical Exam  General appearance - alert, well appearing, and in no distress Mental status - normal mood, behavior, speech, dress, motor activity, and thought processes Chest - clear to auscultation, no wheezes, rales or rhonchi, symmetric air entry Heart - normal rate, regular rhythm, normal S1, S2, no murmurs, rubs, clicks or gallops Neurological - cranial nerves II through XII intact, motor and sensory grossly normal bilaterally.  Gait is wide-based and slow with low foot to floor clearance. Extremities - peripheral pulses normal, no pedal edema, no clubbing or cyanosis      Latest Ref Rng & Units 07/11/2023    4:54 PM 03/12/2023    6:33 AM 03/04/2023    9:31 AM  CMP  Glucose 70 - 99 mg/dL 604  540  981   BUN 8 - 27 mg/dL 15  14  12    Creatinine 0.57 - 1.00 mg/dL 1.91  4.78  2.95   Sodium 134 - 144 mmol/L 138  134  136   Potassium 3.5 - 5.2 mmol/L 4.8  4.2  4.3   Chloride 96 - 106 mmol/L 102  101  102   CO2 20 - 29 mmol/L 20  22  24    Calcium 8.7 - 10.3 mg/dL 9.5  8.7  9.4   Total Protein 6.0 - 8.5 g/dL 8.1     Total Bilirubin 0.0 - 1.2 mg/dL 0.6     Alkaline Phos 44 - 121 IU/L 119     AST 0 - 40 IU/L 24     ALT 0 - 32 IU/L 46      Lipid Panel     Component Value  Date/Time   CHOL 190 07/11/2023 1654   TRIG 138 07/11/2023 1654   HDL 42 07/11/2023 1654   CHOLHDL 4.5 (H) 07/11/2023 1654   CHOLHDL 9.2 (H) 09/28/2015 1420   VLDL 61 (H) 09/28/2015 1420   LDLCALC 123 (H) 07/11/2023 1654    CBC    Component Value Date/Time   WBC 16.1 (H) 03/12/2023 0633   RBC 3.98 03/12/2023 0633   HGB 12.6 03/12/2023 0633   HGB 15.1 02/13/2023  1548   HCT 37.3 03/12/2023 0633   HCT 43.8 02/13/2023 1548   PLT 246 03/12/2023 0633   PLT 302 08/28/2021 0931   MCV 93.7 03/12/2023 0633   MCV 93 02/13/2023 1548   MCH 31.7 03/12/2023 0633   MCHC 33.8 03/12/2023 0633   RDW 13.0 03/12/2023 0633   RDW 13.5 02/13/2023 1548   LYMPHSABS 3.7 (H) 02/13/2023 1548   MONOABS 0.9 12/30/2022 1635   EOSABS 0.1 02/13/2023 1548   BASOSABS 0.1 02/13/2023 1548    ASSESSMENT AND PLAN: 1. Type 2 diabetes mellitus with hyperlipidemia (HCC) (Primary) At goal.  Continue Trulicity 1.5 mg once a week.  Continue healthy eating habits. - POCT glycosylated hemoglobin (Hb A1C) - POCT glucose (manual entry) - Microalbumin / creatinine urine ratio  2. Hypertension associated with type 2 diabetes mellitus (HCC) At goal.  Continue lisinopril 20 mg daily, amlodipine 10 mg daily and propranolol 80 mg twice a day.  3. Hypothyroidism (acquired) Continue levothyroxine.  4. Nocturnal headaches We will get her back in with neurology.  Will get updated imaging of her head. - amitriptyline (ELAVIL) 50 MG tablet; Take 1 tablet (50 mg total) by mouth at bedtime.  Dispense: 90 tablet; Refill: 1 - Ambulatory referral to Neurology - CT HEAD WO CONTRAST ( ); Future  5. Need for Streptococcus pneumoniae vaccination - PNEUMOCOCCAL CONJUGATE VACCINE 15-VALENT     Patient was given the opportunity to ask questions.  Patient verbalized understanding of the plan and was able to repeat key elements of the plan.   This documentation was completed using Paediatric nurse.  Any transcriptional errors are unintentional.  Orders Placed This Encounter  Procedures   CT HEAD WO CONTRAST ( )   PNEUMOCOCCAL CONJUGATE VACCINE 15-VALENT   Microalbumin / creatinine urine ratio   Ambulatory referral to Neurology   POCT glycosylated hemoglobin (Hb A1C)   POCT glucose (manual entry)     Requested Prescriptions   Signed Prescriptions Disp Refills    amitriptyline (ELAVIL) 50 MG tablet 90 tablet 1    Sig: Take 1 tablet (50 mg total) by mouth at bedtime.    Return in about 4 months (around 03/16/2024).  Jonah Blue, MD, FACP

## 2023-11-19 ENCOUNTER — Encounter: Payer: Self-pay | Admitting: Internal Medicine

## 2023-11-19 LAB — MICROALBUMIN / CREATININE URINE RATIO
Creatinine, Urine: 376.5 mg/dL
Microalb/Creat Ratio: 13 mg/g{creat} (ref 0–29)
Microalbumin, Urine: 50.5 ug/mL

## 2023-11-21 ENCOUNTER — Ambulatory Visit: Payer: 59 | Admitting: Surgical

## 2023-11-24 ENCOUNTER — Other Ambulatory Visit: Payer: Self-pay | Admitting: Internal Medicine

## 2023-11-24 DIAGNOSIS — E119 Type 2 diabetes mellitus without complications: Secondary | ICD-10-CM

## 2023-11-25 ENCOUNTER — Encounter: Payer: Self-pay | Admitting: Internal Medicine

## 2023-11-25 DIAGNOSIS — F330 Major depressive disorder, recurrent, mild: Secondary | ICD-10-CM | POA: Diagnosis not present

## 2023-12-11 ENCOUNTER — Other Ambulatory Visit: Payer: Self-pay | Admitting: *Deleted

## 2023-12-11 ENCOUNTER — Encounter: Payer: Self-pay | Admitting: Internal Medicine

## 2023-12-11 DIAGNOSIS — E119 Type 2 diabetes mellitus without complications: Secondary | ICD-10-CM

## 2023-12-11 MED ORDER — ACCU-CHEK GUIDE ME W/DEVICE KIT
1.0000 | PACK | Freq: Every day | 0 refills | Status: DC
Start: 2023-12-11 — End: 2024-01-27

## 2023-12-12 ENCOUNTER — Other Ambulatory Visit: Payer: Self-pay | Admitting: Surgical

## 2023-12-12 ENCOUNTER — Other Ambulatory Visit: Payer: 59

## 2023-12-12 ENCOUNTER — Inpatient Hospital Stay: Admission: RE | Admit: 2023-12-12 | Payer: 59 | Source: Ambulatory Visit

## 2023-12-12 MED ORDER — ACETAMINOPHEN-CODEINE 300-30 MG PO TABS: 1.0000 | ORAL_TABLET | Freq: Every evening | ORAL | 0 refills | Status: DC | PRN

## 2023-12-17 ENCOUNTER — Ambulatory Visit
Admission: RE | Admit: 2023-12-17 | Discharge: 2023-12-17 | Disposition: A | Discharge status: Home / Self Care | Payer: Medicaid Other | Source: Ambulatory Visit | Attending: Internal Medicine | Admitting: Internal Medicine

## 2023-12-17 DIAGNOSIS — R519 Headache, unspecified: Secondary | ICD-10-CM | POA: Diagnosis not present

## 2023-12-17 DIAGNOSIS — R42 Dizziness and giddiness: Secondary | ICD-10-CM | POA: Diagnosis not present

## 2023-12-22 ENCOUNTER — Other Ambulatory Visit: Payer: Self-pay | Admitting: Internal Medicine

## 2023-12-22 DIAGNOSIS — I152 Hypertension secondary to endocrine disorders: Secondary | ICD-10-CM

## 2023-12-29 ENCOUNTER — Encounter: Payer: Self-pay | Admitting: Neurology

## 2023-12-30 NOTE — Discharge Instructions (Signed)

## 2023-12-31 ENCOUNTER — Other Ambulatory Visit: Payer: Self-pay | Admitting: Surgical

## 2023-12-31 ENCOUNTER — Ambulatory Visit
Admission: RE | Admit: 2023-12-31 | Discharge: 2023-12-31 | Disposition: A | Discharge status: Home / Self Care | Payer: 59 | Source: Ambulatory Visit | Attending: Surgical | Admitting: Surgical

## 2023-12-31 ENCOUNTER — Ambulatory Visit
Admission: RE | Admit: 2023-12-31 | Discharge: 2023-12-31 | Disposition: A | Discharge status: Home / Self Care | Payer: 59 | Source: Ambulatory Visit | Attending: Surgical

## 2023-12-31 DIAGNOSIS — M25511 Pain in right shoulder: Secondary | ICD-10-CM | POA: Diagnosis not present

## 2023-12-31 DIAGNOSIS — G8929 Other chronic pain: Secondary | ICD-10-CM

## 2023-12-31 DIAGNOSIS — R29898 Other symptoms and signs involving the musculoskeletal system: Secondary | ICD-10-CM | POA: Diagnosis not present

## 2023-12-31 MED ORDER — IOPAMIDOL (ISOVUE-M 200) INJECTION 41%
10.0000 mL | Freq: Once | INTRAMUSCULAR | Status: AC
Start: 2023-12-31 — End: 2023-12-31
  Administered 2023-12-31: 10 mL via INTRA_ARTICULAR

## 2023-12-31 MED ORDER — TRAMADOL HCL 50 MG PO TABS
50.0000 mg | ORAL_TABLET | Freq: Every evening | ORAL | 0 refills | Status: DC | PRN
Start: 2023-12-31 — End: 2024-01-31

## 2024-01-01 DIAGNOSIS — F330 Major depressive disorder, recurrent, mild: Secondary | ICD-10-CM | POA: Diagnosis not present

## 2024-01-01 NOTE — Telephone Encounter (Signed)
 Waiting for the scan to be read

## 2024-01-07 DIAGNOSIS — F330 Major depressive disorder, recurrent, mild: Secondary | ICD-10-CM | POA: Diagnosis not present

## 2024-01-09 ENCOUNTER — Encounter: Payer: Self-pay | Admitting: Internal Medicine

## 2024-01-09 ENCOUNTER — Other Ambulatory Visit: Payer: Self-pay | Admitting: Internal Medicine

## 2024-01-09 ENCOUNTER — Telehealth: Payer: Self-pay | Admitting: Internal Medicine

## 2024-01-09 DIAGNOSIS — K219 Gastro-esophageal reflux disease without esophagitis: Secondary | ICD-10-CM

## 2024-01-09 MED ORDER — OMEPRAZOLE 20 MG PO CPDR: 20.0000 mg | DELAYED_RELEASE_CAPSULE | Freq: Every day | ORAL | 0 refills | Status: DC

## 2024-01-09 NOTE — Telephone Encounter (Signed)
 PC placed 2x today to [phone]. I got an automated VM.  Did not leave message.  Will send results of CT head via Mychart.

## 2024-01-12 ENCOUNTER — Telehealth: Payer: Self-pay

## 2024-01-12 NOTE — Telephone Encounter (Signed)
 Hey Autumn, can we get an appointment for [name] to see me next Monday?

## 2024-01-12 NOTE — Telephone Encounter (Signed)
 Copied from CRM [id number]. Topic: General - Call Back - No Documentation >> Jan 09, 2024  3:46 PM Payton Doughty wrote: Reason for CRM: pt called backReLL

## 2024-01-13 ENCOUNTER — Telehealth: Payer: Self-pay | Admitting: Surgical

## 2024-01-13 NOTE — Telephone Encounter (Signed)
 Lvm for pt to cb to schedule.

## 2024-01-14 ENCOUNTER — Telehealth: Payer: Self-pay | Admitting: Internal Medicine

## 2024-01-14 NOTE — Telephone Encounter (Signed)
 PC returned to pt today to address questions about recent CAT scan of brain.  Inform that CAT scan showed some nonspecific changes similar to what was seen on MRI of the brain that was done back in 2018.  It showed some atrophy of the frontal lobe which sometimes can be indicative of neurodegenerative disorders including certain dementias.  Advised that I would need neurology evaluation to be done to assist with interpreting the significance of the findings seen on the CAT scan. Advise to keep upcoming appt with Dr. Lucia Gaskins.  All questions answered.

## 2024-01-19 ENCOUNTER — Other Ambulatory Visit: Payer: Self-pay | Admitting: Internal Medicine

## 2024-01-20 DIAGNOSIS — F411 Generalized anxiety disorder: Secondary | ICD-10-CM | POA: Diagnosis not present

## 2024-01-21 ENCOUNTER — Ambulatory Visit: Admitting: Surgical

## 2024-01-21 ENCOUNTER — Encounter: Payer: Self-pay | Admitting: Surgical

## 2024-01-21 DIAGNOSIS — M7501 Adhesive capsulitis of right shoulder: Secondary | ICD-10-CM | POA: Diagnosis not present

## 2024-01-21 DIAGNOSIS — M7521 Bicipital tendinitis, right shoulder: Secondary | ICD-10-CM

## 2024-01-23 ENCOUNTER — Encounter: Payer: Self-pay | Admitting: Surgical

## 2024-01-23 NOTE — Progress Notes (Signed)
 Office Visit Note   Patient: [name]           Date of Birth: [birth year]           MRN: [id number] Visit Date: 01/21/2024 Requested by: Marcine Matar, MD [address] [place],  [place] [address] PCP: Marcine Matar, MD  Subjective: Chief Complaint  Patient presents with   Right Shoulder - Pain, Follow-up    MRI review    HPI: [name] is a 63 y.o. female who presents to the office for MRI review.  Continues to complain mainly of right shoulder pain.  Has continued pain that has been progressively worsening.  Taking occasional tramadol to help with sleeping.  Pain is getting in the way of sleeping if it was not for the tramadol.  She localizes pain primarily to the anterior aspect of the right shoulder as well as in the mid humeral region laterally.  She denies any significant weakness, primarily has complaint of pain and some stiffness.  MRI results revealed: MR SHOULDER RIGHT W CONTRAST Result Date: 01/12/2024 CLINICAL DATA:  Right shoulder pain, limited range of motion for 3 months EXAM: MRI OF THE RIGHT SHOULDER WITH CONTRAST TECHNIQUE: Multiplanar, multisequence MR imaging of the right shoulder was performed following the administration of intra-articular contrast. CONTRAST:  See Injection Documentation. COMPARISON:  None Available. FINDINGS: Rotator cuff: Supraspinatus tendon is intact. Mild infraspinatus tendinosis. Teres minor tendon is intact. Subscapularis tendon is intact. Muscles: No muscle atrophy or edema. No intramuscular fluid collection or hematoma. Biceps Long Head: Mild tendinosis of the intra-articular portion of the long head of the biceps tendon. Acromioclavicular Joint: Mild arthropathy of the acromioclavicular joint. No subacromial/subdeltoid bursal fluid. Glenohumeral Joint: Intraarticular contrast distending the joint capsule. Normal glenohumeral ligaments. Mild partial-thickness cartilage loss of the glenohumeral joint. Mild thickening  of the inferior joint capsule as can be seen with adhesive capsulitis. Labrum: Intact. Bones: No fracture or dislocation. No aggressive osseous lesion. Other: No fluid collection or hematoma. IMPRESSION: 1. Mild infraspinatus tendinosis. 2. Mild tendinosis of the intra-articular portion of the long head of the biceps tendon. 3. Mild thickening of the inferior joint capsule as can be seen with adhesive capsulitis. Electronically Signed   By: Elige Ko M.D.   On: 01/12/2024 11:47                 ROS: All systems reviewed are negative as they relate to the chief complaint within the history of present illness.  Patient denies fevers or chills.  Assessment & Plan: Visit Diagnoses:  1. Adhesive capsulitis of right shoulder   2. Biceps tendinitis of right shoulder     Plan: [name] is a 63 y.o. female who presents to the office for review of right shoulder MRI.  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  We have tried glenohumeral injections in the past that have provided temporary relief but never significant resolution of her symptoms.  Seems that her right shoulder is getting more stiff relative to past visits and at this point patient would like definitive relief of her symptoms.  She would like to proceed with right shoulder manipulation under anesthesia with arthroscopic rotator interval release.  We will also take a look at the bicep tendon anchor with a very  low threshold for mini open bicep tenodesis if there is any abnormality noted due to her history of pain in the past localizing to the bicipital groove and positive O'Brien sign on exam today.  We discussed the risks and benefits of the procedure as well as the recovery timeframe.  She would still like to proceed with  surgical intervention.  Follow-up after procedure.  Follow-Up Instructions: No follow-ups on file.   Orders:  No orders of the defined types were placed in this encounter.  No orders of the defined types were placed in this encounter.     Procedures: No procedures performed   Clinical Data: No additional findings.  Objective: Vital Signs: LMP 06/06/2015   Physical Exam:  Constitutional: Patient appears well-developed HEENT:  Head: Normocephalic Eyes:EOM are normal Neck: Normal range of motion Cardiovascular: Normal rate Pulmonary/chest: Effort normal Neurologic: Patient is alert Skin: Skin is warm Psychiatric: Patient has normal mood and affect  Ortho Exam: Ortho exam demonstrates right shoulder with 30 degrees X rotation, 70 degrees abduction, 145 degrees forward elevation passively and actively.  This compared with the left shoulder with 60 degrees X rotation, 110 degrees abduction, 175 degrees forward elevation passively and actively.  Intact EPL, FPL, finger abduction, pronation/abduction, bicep, tricep, deltoid of the right arm.  Excellent rotator cuff strength of supra, infra, subscap rated 5/5.  Cellulitis or skin changes noted over the right shoulder region.  2+ radial pulse of the right upper extremity.  She has no significant pain reproduced with neck range of motion.  Positive O'Brien sign.  Tenderness over the bicipital groove rated moderate.  Minimal tenderness over the right AC joint which is slightly increased over the left [employer] joint which is nontender.  Specialty Comments:  MRI CERVICAL SPINE WITHOUT CONTRAST   TECHNIQUE: Multiplanar, multisequence MR imaging of the cervical spine was performed. No intravenous contrast was administered.   COMPARISON:  None Available.   FINDINGS: Alignment: Physiologic.   Vertebrae: No acute fracture, evidence of discitis, or aggressive bone lesion.   Cord: Normal signal and morphology.   Posterior Fossa, vertebral  arteries, paraspinal tissues: Posterior fossa demonstrates no focal abnormality. Vertebral artery flow voids are maintained. Paraspinal soft tissues are unremarkable.   Disc levels:   Discs: Degenerative disease with disc height loss at C4-5, C5-6 and C6-7.   C2-3: No disc protrusion. Mild right facet arthropathy. Moderate-severe right foraminal stenosis. No left foraminal stenosis. No spinal stenosis.   C3-4: Mild broad-based disc bulge. Moderate left and mild right facet arthropathy. Moderate-severe left foraminal stenosis. Mild right foraminal stenosis. No spinal stenosis.   C4-5: Broad left paracentral disc protrusion contacting the left ventral paracentral cervical spinal cord. Moderate left foraminal stenosis. Mild right foraminal stenosis. Mild spinal stenosis.   C5-6: Mild broad-based disc bulge. Bilateral uncovertebral degenerative changes. Severe bilateral foraminal stenosis. Mild spinal stenosis.   C6-7: Broad-based disc bulge. Mild-moderate left foraminal stenosis. Mild right foraminal stenosis. No spinal stenosis.   C7-T1: Small central disc protrusion. No foraminal or central canal stenosis.   IMPRESSION: 1. Cervical spine spondylosis as described above. 2. No acute osseous injury of the cervical spine.     Electronically Signed   By: Elige Ko M.D.   On: 12/25/2022 09:13  Imaging: No results found.   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   Brain atrophy (HCC) 12/18/2022   Complete tear of right rotator cuff 05/16/2022   Adhesive capsulitis of right shoulder  03/14/2022   Influenza vaccine needed 10/31/2020   Need for diphtheria-tetanus-pertussis (Tdap) vaccine 10/31/2020   Hyperlipidemia associated with type 2 diabetes mellitus (HCC) 10/31/2020   Overweight (BMI 25.0-29.9) 10/31/2020   Type 2 diabetes mellitus without complication, without long-term current use of insulin (HCC)  07/31/2020   OSA (obstructive sleep apnea) 02/02/2020   Spondylosis without myelopathy or radiculopathy, lumbar region 09/17/2017   Essential hypertension 09/05/2017   Chronic bilateral low back pain with left-sided sciatica 09/05/2017   HTN (hypertension) 05/05/2017   Hypothyroidism 02/24/2017   Mixed hyperlipidemia 02/24/2017   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   Sleep related headaches 01/23/2017   Snoring 01/23/2017   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   Chronic headaches 10/30/2016   Obesity (BMI 30.0-34.9) 05/24/2016   PMB (postmenopausal bleeding) 05/24/2016   Back pain of lumbar region with sciatica 09/28/2015   Past Medical History:  Diagnosis Date   Anxiety    Arthritis    Depression    Diabetes mellitus without complication (HCC)    GERD (gastroesophageal reflux disease)    Hyperlipidemia    Hypertension    Migraines    Thyroid disease     Family History  Problem Relation Age of Onset   Headache Neg Hx    Colon cancer Neg Hx    Esophageal cancer Neg Hx    Rectal cancer Neg Hx    Stomach cancer Neg Hx    Breast cancer Neg Hx     Past Surgical History:  Procedure Laterality Date   ANTERIOR CERVICAL DECOMP/DISCECTOMY FUSION N/A 03/11/2023   Procedure: C4-5, C5-6, C6-7 ANTERIOR CERVICAL DECOMPRESSION/DISCECTOMY FUSION 3 LEVELS;  Surgeon: London Sheer, MD;  Location: [facility] OR;  Service: Orthopedics;  Laterality: N/A;   BREAST CYST EXCISION Right 1980   BREAST CYST EXCISION Left 1985   NO PAST SURGERIES     Social History   Occupational History   Occupation: Unemployed  Tobacco Use   Smoking status: Never   Smokeless tobacco: Never  Vaping Use   Vaping status: Never Used  Substance and Sexual Activity   Alcohol use: No    Alcohol/week: 0.0 standard drinks of alcohol   Drug use: No   Sexual activity: Not Currently    Partners: Male

## 2024-01-26 NOTE — Progress Notes (Signed)
 Surgical Instructions   Your procedure is scheduled on Thursday, April 10, 25.. Report to [facility] Main Entrance "A" at 5:30 A.M., then check in with the Admitting office. Any questions or running late day of surgery: call [phone]  Questions prior to your surgery date: call [phone], Monday-Friday, 8am-4pm. If you experience any cold or flu symptoms such as cough, fever, chills, shortness of breath, etc. between now and your scheduled surgery, please notify us at the above number.     Remember:  Do not eat after midnight the night before your surgery   You may drink clear liquids until 4:30 the morning of your surgery.   Clear liquids allowed are: Water, Non-Citrus Juices (without pulp), Carbonated Beverages, Clear Tea (no milk, honey, etc.), Black Coffee Only (NO MILK, CREAM OR POWDERED CREAMER of any kind), and Gatorade.    Take these medicines the morning of surgery with A SIP OF WATER  amLODipine (NORVASC)  atorvastatin (LIPITOR)  busPIRone (BUSPAR)  DULoxetine (CYMBALTA)  levothyroxine (SYNTHROID)  omeprazole (PRILOSEC)  propranolol (INDERAL)    May take these medicines IF NEEDED: traMADol (ULTRAM)    One week prior to surgery, STOP taking any Aspirin (unless otherwise instructed by your surgeon) Aleve, Naproxen, Ibuprofen, Motrin, Advil, Goody's, BC's, all herbal medications, fish oil, and non-prescription vitamins.          WHAT DO I DO ABOUT MY DIABETES MEDICATION?   As of today, HOLD your diabetic injectable medication TRULICITY until after your surgery.    If your CBG is greater than 220 mg/dL, you may take  of your sliding scale (correction) dose of insulin.   HOW TO MANAGE YOUR DIABETES BEFORE AND AFTER SURGERY  Why is it important to control my blood sugar before and after surgery? Improving blood sugar levels before and after surgery helps healing and can limit problems. A way of improving blood sugar control is eating a healthy diet by:   Eating less sugar and carbohydrates  Increasing activity/exercise  Talking with your doctor about reaching your blood sugar goals High blood sugars (greater than 180 mg/dL) can raise your risk of infections and slow your recovery, so you will need to focus on controlling your diabetes during the weeks before surgery. Make sure that the doctor who takes care of your diabetes knows about your planned surgery including the date and location.  How do I manage my blood sugar before surgery? Check your blood sugar at least 4 times a day, starting 2 days before surgery, to make sure that the level is not too high or low.  Check your blood sugar the morning of your surgery when you wake up and every 2 hours until you get to the Short Stay unit.  If your blood sugar is less than 70 mg/dL, you will need to treat for low blood sugar: Do not take insulin. Treat a low blood sugar (less than 70 mg/dL) with  cup of clear juice (cranberry or apple), 4 glucose tablets, OR glucose gel. Recheck blood sugar in 15 minutes after treatment (to make sure it is greater than 70 mg/dL). If your blood sugar is not greater than 70 mg/dL on recheck, call [phone] for further instructions. Report your blood sugar to the short stay nurse when you get to Short Stay.  If you are admitted to the hospital after surgery: Your blood sugar will be checked by the staff and you will probably be given insulin after surgery (instead of oral diabetes medicines) to make sure  you have good blood sugar levels. The goal for blood sugar control after surgery is 80-180 mg/dL.            Do NOT Smoke (Tobacco/Vaping) for 24 hours prior to your procedure.  If you use a CPAP at night, you may bring your mask/headgear for your overnight stay.   You will be asked to remove any contacts, glasses, piercing's, hearing aid's, dentures/partials prior to surgery. Please bring cases for these items if needed.    Patients discharged the day of  surgery will not be allowed to drive home, and someone needs to stay with them for 24 hours.  SURGICAL WAITING ROOM VISITATION Patients may have no more than 2 support people in the waiting area - these visitors may rotate.   Pre-op nurse will coordinate an appropriate time for 1 ADULT support person, who may not rotate, to accompany patient in pre-op.  Children under the age of 9 must have an adult with them who is not the patient and must remain in the main waiting area with an adult.  If the patient needs to stay at the hospital during part of their recovery, the visitor guidelines for inpatient rooms apply.  Please refer to the [facility] website for the visitor guidelines for any additional information.   If you received a COVID test during your pre-op visit  it is requested that you wear a mask when out in public, stay away from anyone that may not be feeling well and notify your surgeon if you develop symptoms. If you have been in contact with anyone that has tested positive in the last 10 days please notify you surgeon.      Pre-operative CHG Bathing Instructions   You can play a key role in reducing the risk of infection after surgery. Your skin needs to be as free of germs as possible. You can reduce the number of germs on your skin by washing with CHG (chlorhexidine gluconate) soap before surgery. CHG is an antiseptic soap that kills germs and continues to kill germs even after washing.   DO NOT use if you have an allergy to chlorhexidine/CHG or antibacterial soaps. If your skin becomes reddened or irritated, stop using the CHG and notify one of our RNs at [phone].              TAKE A SHOWER THE NIGHT BEFORE SURGERY AND THE DAY OF SURGERY    Please keep in mind the following:  DO NOT shave, including legs and underarms, 48 hours prior to surgery.   You may shave your face before/day of surgery.  Place clean sheets on your bed the night before surgery Use a clean  washcloth (not used since being washed) for each shower. DO NOT sleep with pet's night before surgery.  CHG Shower Instructions:  Wash your face and private area with normal soap. If you choose to wash your hair, wash first with your normal shampoo.  After you use shampoo/soap, rinse your hair and body thoroughly to remove shampoo/soap residue.  Turn the water OFF and apply half the bottle of CHG soap to a CLEAN washcloth.  Apply CHG soap ONLY FROM YOUR NECK DOWN TO YOUR TOES (washing for 3-5 minutes)  DO NOT use CHG soap on face, private areas, open wounds, or sores.  Pay special attention to the area where your surgery is being performed.  If you are having back surgery, having someone wash your back for you may be helpful. Wait  2 minutes after CHG soap is applied, then you may rinse off the CHG soap.  Pat dry with a clean towel  Put on clean pajamas    Additional instructions for the day of surgery: DO NOT APPLY any lotions, deodorants, cologne, or perfumes.   Do not wear jewelry or makeup Do not wear nail polish, gel polish, artificial nails, or any other type of covering on natural nails (fingers and toes) Do not bring valuables to the hospital. [facility] is not responsible for valuables/personal belongings. Put on clean/comfortable clothes.  Please brush your teeth.  Ask your nurse before applying any prescription medications to the skin.

## 2024-01-27 ENCOUNTER — Other Ambulatory Visit: Payer: Self-pay

## 2024-01-27 ENCOUNTER — Encounter (HOSPITAL_COMMUNITY): Payer: Self-pay

## 2024-01-27 ENCOUNTER — Encounter (HOSPITAL_COMMUNITY)
Admission: RE | Admit: 2024-01-27 | Discharge: 2024-01-27 | Disposition: A | Discharge status: Home / Self Care | Source: Ambulatory Visit | Attending: Orthopedic Surgery | Admitting: Orthopedic Surgery

## 2024-01-27 VITALS — BP 106/81 | HR 77 | Temp 98.2°F | Resp 17 | Ht 67.0 in | Wt 187.0 lb

## 2024-01-27 DIAGNOSIS — Z01812 Encounter for preprocedural laboratory examination: Secondary | ICD-10-CM | POA: Diagnosis present

## 2024-01-27 DIAGNOSIS — Z01818 Encounter for other preprocedural examination: Secondary | ICD-10-CM | POA: Diagnosis not present

## 2024-01-27 DIAGNOSIS — Z01810 Encounter for preprocedural cardiovascular examination: Secondary | ICD-10-CM | POA: Diagnosis present

## 2024-01-27 HISTORY — DX: Hypothyroidism, unspecified: E03.9

## 2024-01-27 LAB — CBC
HCT: 43.7 % (ref 36.0–46.0)
Hemoglobin: 15 g/dL (ref 12.0–15.0)
MCH: 32.4 pg (ref 26.0–34.0)
MCHC: 34.3 g/dL (ref 30.0–36.0)
MCV: 94.4 fL (ref 80.0–100.0)
Platelets: 303 10*3/uL (ref 150–400)
RBC: 4.63 MIL/uL (ref 3.87–5.11)
RDW: 12.9 % (ref 11.5–15.5)
WBC: 6.7 10*3/uL (ref 4.0–10.5)
nRBC: 0 % (ref 0.0–0.2)

## 2024-01-27 LAB — BASIC METABOLIC PANEL WITH GFR
Anion gap: 11 (ref 5–15)
BUN: 17 mg/dL (ref 8–23)
CO2: 25 mmol/L (ref 22–32)
Calcium: 9 mg/dL (ref 8.9–10.3)
Chloride: 102 mmol/L (ref 98–111)
Creatinine, Ser: 0.87 mg/dL (ref 0.44–1.00)
GFR, Estimated: >60 mL/min (ref >60)
Glucose, Bld: 128 mg/dL — ABNORMAL HIGH (ref 70–99)
Potassium: 4.2 mmol/L (ref 3.5–5.1)
Sodium: 138 mmol/L (ref 135–145)

## 2024-01-27 LAB — HEMOGLOBIN A1C
Hgb A1c MFr Bld: 5.5 % (ref 4.8–5.6)
Mean Plasma Glucose: 111.15 mg/dL

## 2024-01-27 LAB — GLUCOSE, CAPILLARY: Glucose-Capillary: 131 mg/dL — ABNORMAL HIGH (ref 70–99)

## 2024-01-27 NOTE — Progress Notes (Signed)
 PCP - Marcine Matar, MD  Cardiologist -   PPM/ICD - denies Device Orders - n/a Rep Notified - n/a  Chest x-ray - denies EKG - 01-27-24 Stress Test - denies ECHO - denies Cardiac Cath - denies  Sleep Study - denies   Fasting Blood Sugar - per patient between 125-131  Checks Blood Sugar TID  Last dose of GLP1 agonist-  Trulicity GLP1 instructions: Last dose 01-22-24  Blood Thinner Instructions: denies Aspirin Instructions: n/a  ERAS Protcol -clear liquids until 4:30   COVID TEST-    Anesthesia review: no  Patient denies shortness of breath, fever, cough and chest pain at PAT appointment   All instructions explained to the patient, with a verbal understanding of the material. Patient agrees to go over the instructions while at home for a better understanding. Patient also instructed to self quarantine after being tested for COVID-19. The opportunity to ask questions was provided.

## 2024-01-29 ENCOUNTER — Other Ambulatory Visit: Payer: Self-pay | Admitting: Radiology

## 2024-01-29 ENCOUNTER — Encounter (HOSPITAL_COMMUNITY)
Admission: RE | Disposition: A | Discharge status: Home / Self Care | Payer: Self-pay | Source: Home / Self Care | Attending: Orthopedic Surgery

## 2024-01-29 ENCOUNTER — Other Ambulatory Visit: Payer: Self-pay

## 2024-01-29 ENCOUNTER — Ambulatory Visit (HOSPITAL_COMMUNITY)
Admission: RE | Admit: 2024-01-29 | Discharge: 2024-01-29 | Disposition: A | Discharge status: Home / Self Care | Attending: Orthopedic Surgery | Admitting: Orthopedic Surgery

## 2024-01-29 ENCOUNTER — Ambulatory Visit (HOSPITAL_COMMUNITY): Admitting: Certified Registered Nurse Anesthetist

## 2024-01-29 ENCOUNTER — Encounter (HOSPITAL_COMMUNITY): Payer: Self-pay | Admitting: Orthopedic Surgery

## 2024-01-29 ENCOUNTER — Other Ambulatory Visit: Payer: Self-pay | Admitting: Internal Medicine

## 2024-01-29 DIAGNOSIS — I10 Essential (primary) hypertension: Secondary | ICD-10-CM | POA: Diagnosis not present

## 2024-01-29 DIAGNOSIS — M7521 Bicipital tendinitis, right shoulder: Secondary | ICD-10-CM | POA: Diagnosis not present

## 2024-01-29 DIAGNOSIS — M7501 Adhesive capsulitis of right shoulder: Secondary | ICD-10-CM | POA: Diagnosis not present

## 2024-01-29 DIAGNOSIS — G4730 Sleep apnea, unspecified: Secondary | ICD-10-CM | POA: Diagnosis not present

## 2024-01-29 DIAGNOSIS — F418 Other specified anxiety disorders: Secondary | ICD-10-CM | POA: Diagnosis not present

## 2024-01-29 DIAGNOSIS — Z7985 Long-term (current) use of injectable non-insulin antidiabetic drugs: Secondary | ICD-10-CM | POA: Insufficient documentation

## 2024-01-29 DIAGNOSIS — Z01818 Encounter for other preprocedural examination: Secondary | ICD-10-CM

## 2024-01-29 DIAGNOSIS — M65911 Unspecified synovitis and tenosynovitis, right shoulder: Secondary | ICD-10-CM

## 2024-01-29 DIAGNOSIS — S43431A Superior glenoid labrum lesion of right shoulder, initial encounter: Secondary | ICD-10-CM | POA: Diagnosis not present

## 2024-01-29 DIAGNOSIS — G8918 Other acute postprocedural pain: Secondary | ICD-10-CM | POA: Diagnosis not present

## 2024-01-29 DIAGNOSIS — K219 Gastro-esophageal reflux disease without esophagitis: Secondary | ICD-10-CM | POA: Insufficient documentation

## 2024-01-29 DIAGNOSIS — E039 Hypothyroidism, unspecified: Secondary | ICD-10-CM | POA: Insufficient documentation

## 2024-01-29 DIAGNOSIS — M65811 Other synovitis and tenosynovitis, right shoulder: Secondary | ICD-10-CM | POA: Insufficient documentation

## 2024-01-29 DIAGNOSIS — E1159 Type 2 diabetes mellitus with other circulatory complications: Secondary | ICD-10-CM

## 2024-01-29 DIAGNOSIS — E119 Type 2 diabetes mellitus without complications: Secondary | ICD-10-CM | POA: Insufficient documentation

## 2024-01-29 DIAGNOSIS — X58XXXA Exposure to other specified factors, initial encounter: Secondary | ICD-10-CM | POA: Insufficient documentation

## 2024-01-29 HISTORY — PX: BICEPS TENDON REPAIR: SHX566

## 2024-01-29 HISTORY — PX: POSTERIOR LUMBAR FUSION 2 WITH HARDWARE REMOVAL: SHX7297

## 2024-01-29 HISTORY — PX: EXAM UNDER ANESTHESIA WITH MANIPULATION OF SHOULDER: SHX5817

## 2024-01-29 LAB — GLUCOSE, CAPILLARY
Glucose-Capillary: 124 mg/dL — ABNORMAL HIGH (ref 70–99)
Glucose-Capillary: 200 mg/dL — ABNORMAL HIGH (ref 70–99)

## 2024-01-29 SURGERY — EXAM UNDER ANESTHESIA, SHOULDER, WITH MANIPULATION
Anesthesia: General | Site: Shoulder | Laterality: Right

## 2024-01-29 MED ORDER — ONDANSETRON HCL 4 MG/2ML IJ SOLN
INTRAMUSCULAR | Status: DC | PRN
  Administered 2024-01-29: 4 mg via INTRAVENOUS

## 2024-01-29 MED ORDER — LISINOPRIL 20 MG PO TABS: 20.0000 mg | ORAL_TABLET | Freq: Every day | ORAL | 1 refills | Status: DC

## 2024-01-29 MED ORDER — PROPOFOL 10 MG/ML IV BOLUS
INTRAVENOUS | Status: AC
  Filled 2024-01-29: qty 20

## 2024-01-29 MED ORDER — EPINEPHRINE PF 1 MG/ML IJ SOLN
INTRAMUSCULAR | Status: AC
  Filled 2024-01-29: qty 4

## 2024-01-29 MED ORDER — ALBUMIN HUMAN 5 % IV SOLN: INTRAVENOUS | Status: DC | PRN

## 2024-01-29 MED ORDER — FENTANYL CITRATE (PF) 100 MCG/2ML IJ SOLN
INTRAMUSCULAR | Status: AC
  Filled 2024-01-29: qty 2

## 2024-01-29 MED ORDER — ONDANSETRON HCL 4 MG/2ML IJ SOLN: 4.0000 mg | Freq: Four times a day (QID) | INTRAMUSCULAR | Status: DC | PRN

## 2024-01-29 MED ORDER — BUPIVACAINE-EPINEPHRINE (PF) 0.25% -1:200000 IJ SOLN
INTRAMUSCULAR | Status: AC
  Filled 2024-01-29: qty 30

## 2024-01-29 MED ORDER — FENTANYL CITRATE (PF) 100 MCG/2ML IJ SOLN
25.0000 ug | INTRAMUSCULAR | Status: DC | PRN
  Administered 2024-01-29: 25 ug via INTRAVENOUS

## 2024-01-29 MED ORDER — MIDAZOLAM HCL 2 MG/2ML IJ SOLN
INTRAMUSCULAR | Status: AC
  Filled 2024-01-29: qty 2

## 2024-01-29 MED ORDER — BUPIVACAINE LIPOSOME 1.3 % IJ SUSP
INTRAMUSCULAR | Status: DC | PRN
  Administered 2024-01-29: 10 mL via PERINEURAL

## 2024-01-29 MED ORDER — EPINEPHRINE (ANAPHYLAXIS) 30 MG/30ML IJ SOLN
INTRAMUSCULAR | Status: DC | PRN
  Administered 2024-01-29 (×2): 1 mL via INTRAMUSCULAR

## 2024-01-29 MED ORDER — MIDAZOLAM HCL 2 MG/2ML IJ SOLN
INTRAMUSCULAR | Status: DC | PRN
  Administered 2024-01-29: 1 mg via INTRAVENOUS

## 2024-01-29 MED ORDER — VASOPRESSIN 20 UNIT/ML IV SOLN
INTRAVENOUS | Status: AC
  Filled 2024-01-29: qty 1

## 2024-01-29 MED ORDER — BUPIVACAINE HCL (PF) 0.5 % IJ SOLN
INTRAMUSCULAR | Status: DC | PRN
Start: 2024-01-29 — End: 2024-01-29
  Administered 2024-01-29: 15 mL via PERINEURAL

## 2024-01-29 MED ORDER — PHENYLEPHRINE HCL-NACL 20-0.9 MG/250ML-% IV SOLN
INTRAVENOUS | Status: DC | PRN
  Administered 2024-01-29: 80 ug via INTRAVENOUS
  Administered 2024-01-29: 160 ug via INTRAVENOUS
  Administered 2024-01-29: 80 ug via INTRAVENOUS
  Administered 2024-01-29 (×3): 160 ug via INTRAVENOUS
  Administered 2024-01-29 (×2): 80 ug via INTRAVENOUS

## 2024-01-29 MED ORDER — FENTANYL CITRATE (PF) 250 MCG/5ML IJ SOLN
INTRAMUSCULAR | Status: DC | PRN
  Administered 2024-01-29: 50 ug via INTRAVENOUS

## 2024-01-29 MED ORDER — INSULIN ASPART 100 UNIT/ML IJ SOLN: 0.0000 [IU] | INTRAMUSCULAR | Status: DC | PRN

## 2024-01-29 MED ORDER — ROCURONIUM BROMIDE 10 MG/ML (PF) SYRINGE
PREFILLED_SYRINGE | INTRAVENOUS | Status: DC | PRN
  Administered 2024-01-29: 50 mg via INTRAVENOUS

## 2024-01-29 MED ORDER — CHLORHEXIDINE GLUCONATE 0.12 % MT SOLN
15.0000 mL | Freq: Once | OROMUCOSAL | Status: AC
  Administered 2024-01-29: 15 mL via OROMUCOSAL
  Filled 2024-01-29: qty 15

## 2024-01-29 MED ORDER — ORAL CARE MOUTH RINSE: 15.0000 mL | Freq: Once | OROMUCOSAL | Status: AC

## 2024-01-29 MED ORDER — SODIUM CHLORIDE 0.9 % IR SOLN
Status: DC | PRN
  Administered 2024-01-29 (×2): 3000 mL

## 2024-01-29 MED ORDER — OXYCODONE HCL 5 MG PO TABS: 5.0000 mg | ORAL_TABLET | Freq: Once | ORAL | Status: DC | PRN

## 2024-01-29 MED ORDER — LIDOCAINE 2% (20 MG/ML) 5 ML SYRINGE
INTRAMUSCULAR | Status: DC | PRN
  Administered 2024-01-29: 2 mL via INTRAVENOUS

## 2024-01-29 MED ORDER — 0.9 % SODIUM CHLORIDE (POUR BTL) OPTIME
TOPICAL | Status: DC | PRN
  Administered 2024-01-29: 1000 mL

## 2024-01-29 MED ORDER — BUPIVACAINE-EPINEPHRINE 0.25% -1:200000 IJ SOLN
INTRAMUSCULAR | Status: DC | PRN
  Administered 2024-01-29: 10 mL

## 2024-01-29 MED ORDER — OXYCODONE HCL 5 MG PO TABS: 5.0000 mg | ORAL_TABLET | ORAL | 0 refills | Status: DC | PRN

## 2024-01-29 MED ORDER — PROPOFOL 10 MG/ML IV BOLUS
INTRAVENOUS | Status: DC | PRN
Start: 2024-01-29 — End: 2024-01-29
  Administered 2024-01-29: 150 mg via INTRAVENOUS

## 2024-01-29 MED ORDER — KETOROLAC TROMETHAMINE 30 MG/ML IJ SOLN
INTRAMUSCULAR | Status: DC | PRN
Start: 2024-01-29 — End: 2024-01-29
  Administered 2024-01-29: 30 mg via INTRA_ARTICULAR

## 2024-01-29 MED ORDER — TRANEXAMIC ACID-NACL 1000-0.7 MG/100ML-% IV SOLN
1000.0000 mg | INTRAVENOUS | Status: AC
  Administered 2024-01-29: 1000 mg via INTRAVENOUS
  Filled 2024-01-29: qty 100

## 2024-01-29 MED ORDER — LACTATED RINGERS IV SOLN: INTRAVENOUS | Status: DC | PRN

## 2024-01-29 MED ORDER — VANCOMYCIN HCL 1000 MG IV SOLR
INTRAVENOUS | Status: AC
  Filled 2024-01-29: qty 20

## 2024-01-29 MED ORDER — CEFAZOLIN SODIUM-DEXTROSE 2-4 GM/100ML-% IV SOLN
2.0000 g | INTRAVENOUS | Status: AC
  Administered 2024-01-29: 2 g via INTRAVENOUS
  Filled 2024-01-29: qty 100

## 2024-01-29 MED ORDER — OXYCODONE HCL 5 MG/5ML PO SOLN: 5.0000 mg | Freq: Once | ORAL | Status: DC | PRN

## 2024-01-29 MED ORDER — SUGAMMADEX SODIUM 200 MG/2ML IV SOLN
INTRAVENOUS | Status: DC | PRN
  Administered 2024-01-29: 200 mg via INTRAVENOUS

## 2024-01-29 MED ORDER — EPHEDRINE SULFATE-NACL 50-0.9 MG/10ML-% IV SOSY
PREFILLED_SYRINGE | INTRAVENOUS | Status: DC | PRN
  Administered 2024-01-29 (×4): 10 mg via INTRAVENOUS
  Administered 2024-01-29: 5 mg via INTRAVENOUS

## 2024-01-29 MED ORDER — CELECOXIB 100 MG PO CAPS
100.0000 mg | ORAL_CAPSULE | Freq: Two times a day (BID) | ORAL | 0 refills | Status: DC
Start: 2024-01-29 — End: 2024-04-22

## 2024-01-29 MED ORDER — KETOROLAC TROMETHAMINE 30 MG/ML IJ SOLN
INTRAMUSCULAR | Status: AC
  Filled 2024-01-29: qty 1

## 2024-01-29 SURGICAL SUPPLY — 68 items
ANCHOR SUT 1.8 FIBERTAK SB KL (Anchor) IMPLANT
BAG COUNTER SPONGE SURGICOUNT (BAG) ×2 IMPLANT
BLADE CLIPPER SURG (BLADE) ×2 IMPLANT
BLADE SURG 10 STRL SS (BLADE) ×2 IMPLANT
BNDG COHESIVE 4X5 TAN STRL LF (GAUZE/BANDAGES/DRESSINGS) ×2 IMPLANT
BNDG ELASTIC 4X5.8 VLCR STR LF (GAUZE/BANDAGES/DRESSINGS) IMPLANT
BNDG ESMARK 4X9 LF (GAUZE/BANDAGES/DRESSINGS) IMPLANT
BNDG GAUZE DERMACEA FLUFF 4 (GAUZE/BANDAGES/DRESSINGS) IMPLANT
CARTRIDGE CURVETEK MED (MISCELLANEOUS) IMPLANT
CARTRIDGE CURVETEK XLRG (MISCELLANEOUS) IMPLANT
CARTRIDGE CURVTEK 12MM LGE (INSTRUMENTS) IMPLANT
COVER SURGICAL LIGHT HANDLE (MISCELLANEOUS) ×2 IMPLANT
CUFF TOURN SGL QUICK 18X4 (TOURNIQUET CUFF) ×2 IMPLANT
CUFF TRNQT CYL 24X4X16.5-23 (TOURNIQUET CUFF) IMPLANT
DRAPE INCISE IOBAN 66X45 STRL (DRAPES) IMPLANT
DRAPE OEC MINIVIEW 54X84 (DRAPES) IMPLANT
DRAPE STERI 35X30 U-POUCH (DRAPES) IMPLANT
DRAPE U-SHAPE 47X51 STRL (DRAPES) ×2 IMPLANT
DRSG TEGADERM 4X4.75 (GAUZE/BANDAGES/DRESSINGS) IMPLANT
DRSG XEROFORM 1X8 (GAUZE/BANDAGES/DRESSINGS) IMPLANT
DURAPREP 26ML APPLICATOR (WOUND CARE) ×2 IMPLANT
ELECT REM PT RETURN 9FT ADLT (ELECTROSURGICAL) ×2 IMPLANT
ELECTRODE REM PT RTRN 9FT ADLT (ELECTROSURGICAL) ×2 IMPLANT
FACESHIELD WRAPAROUND (MASK) ×2 IMPLANT
FACESHIELD WRAPAROUND OR TEAM (MASK) ×2 IMPLANT
GAUZE SPONGE 4X4 12PLY STRL (GAUZE/BANDAGES/DRESSINGS) IMPLANT
GAUZE XEROFORM 1X8 LF (GAUZE/BANDAGES/DRESSINGS) IMPLANT
GLOVE BIO SURGEON ST LM GN SZ9 (GLOVE) ×2 IMPLANT
GLOVE BIOGEL PI IND STRL 8 (GLOVE) ×2 IMPLANT
GLOVE BIOGEL PI IND STRL 9 (GLOVE) ×2 IMPLANT
GLOVE ECLIPSE 8.0 STRL XLNG CF (GLOVE) ×2 IMPLANT
GOWN STRL REUS W/ TWL LRG LVL3 (GOWN DISPOSABLE) ×4 IMPLANT
GOWN STRL REUS W/ TWL XL LVL3 (GOWN DISPOSABLE) ×2 IMPLANT
KIT BASIN OR (CUSTOM PROCEDURE TRAY) ×2 IMPLANT
KIT STR SPEAR 1.8 FBRTK DISP (KITS) IMPLANT
KIT TURNOVER KIT B (KITS) ×2 IMPLANT
MANIFOLD NEPTUNE II (INSTRUMENTS) ×2 IMPLANT
NDL 22X1.5 STRL (OR ONLY) (MISCELLANEOUS) IMPLANT
NDL SUT 6 .5 CRC .975X.05 MAYO (NEEDLE) ×2 IMPLANT
NEEDLE 22X1.5 STRL (OR ONLY) (MISCELLANEOUS) IMPLANT
NS IRRIG 1000ML POUR BTL (IV SOLUTION) ×2 IMPLANT
PACK ORTHO EXTREMITY (CUSTOM PROCEDURE TRAY) ×2 IMPLANT
PAD ARMBOARD POSITIONER FOAM (MISCELLANEOUS) ×4 IMPLANT
PAD CAST 3X4 CTTN HI CHSV (CAST SUPPLIES) IMPLANT
PASSER SUT SWANSON 36MM LOOP (INSTRUMENTS) ×2 IMPLANT
SPONGE T-LAP 4X18 ~~LOC~~+RFID (SPONGE) ×4 IMPLANT
STAPLER VISISTAT 35W (STAPLE) IMPLANT
STOCKINETTE IMPERVIOUS 9X36 MD (GAUZE/BANDAGES/DRESSINGS) ×2 IMPLANT
STRIP CLOSURE SKIN 1/2X4 (GAUZE/BANDAGES/DRESSINGS) ×2 IMPLANT
SUCTION TUBE FRAZIER 10FR DISP (SUCTIONS) ×2 IMPLANT
SUT ETHILON 3 0 PS 1 (SUTURE) IMPLANT
SUT FIBERWIRE #2 38 T-5 BLUE (SUTURE) ×4 IMPLANT
SUT MNCRL AB 3-0 PS2 27 (SUTURE) IMPLANT
SUT PROLENE 3 0 PS 1 (SUTURE) IMPLANT
SUT VIC AB 1 CT1 27XBRD ANBCTR (SUTURE) IMPLANT
SUT VIC AB 2-0 CT2 27 (SUTURE) IMPLANT
SUT VIC AB 2-0 CTB1 (SUTURE) IMPLANT
SUT VIC AB 3-0 X1 27 (SUTURE) IMPLANT
SUT VICRYL 0 UR6 27IN ABS (SUTURE) IMPLANT
SUTURE FIBERWR #2 38 T-5 BLUE (SUTURE) ×4 IMPLANT
SYR CONTROL 10ML LL (SYRINGE) IMPLANT
SYS FBRTK BUTTON 2.6 (Anchor) ×2 IMPLANT
SYSTEM FBRTK BUTTON 2.6 (Anchor) IMPLANT
TOWEL GREEN STERILE (TOWEL DISPOSABLE) ×2 IMPLANT
TOWEL GREEN STERILE FF (TOWEL DISPOSABLE) ×2 IMPLANT
TUBE CONNECTING 12X1/4 (SUCTIONS) ×2 IMPLANT
WATER STERILE IRR 1000ML POUR (IV SOLUTION) ×2 IMPLANT
YANKAUER SUCT BULB TIP NO VENT (SUCTIONS) IMPLANT

## 2024-01-29 NOTE — Transfer of Care (Signed)
 Immediate Anesthesia Transfer of Care Note  Patient: [name]  Procedure(s) Performed: EXAM UNDER ANESTHESIA, SHOULDER, WITH MANIPULATION (Right: Shoulder) ARTHROSCOPY, SHOULDER WITH DEBRIDEMENT (Right) REPAIR, TENDON, BICEPS, PROXIMAL (Right)  Patient Location: PACU  Anesthesia Type:General  Level of Consciousness: awake, alert , and oriented  Airway & Oxygen Therapy: Patient Spontanous Breathing and Patient connected to face mask oxygen  Post-op Assessment: Report given to RN and Post -op Vital signs reviewed and stable  Post vital signs: Reviewed and stable  Last Vitals:  Vitals Value Taken Time  BP 117/71 01/29/24 0935  Temp 36.5 C 01/29/24 0935  Pulse 75 01/29/24 0939  Resp 18 01/29/24 0939  SpO2 94 % 01/29/24 0939  Vitals shown include unfiled device data.  Last Pain:  Vitals:   01/29/24 0935  TempSrc:   PainSc: 0-No pain      Patients Stated Pain Goal: 2 (01/29/24 0603)  Complications: No notable events documented.

## 2024-01-29 NOTE — Brief Op Note (Signed)
   01/29/2024  9:11 AM  PATIENT:  [name]  63 y.o. female  PRE-OPERATIVE DIAGNOSIS:  right shoulder adheasive capsulits  POST-OPERATIVE DIAGNOSIS:  right shoulder adheasive capsulits  PROCEDURE:  Procedure(s): EXAM UNDER ANESTHESIA, SHOULDER, WITH MANIPULATION ARTHROSCOPY, SHOULDER WITH DEBRIDEMENT Biceps tenodesis  SURGEON:  Surgeon(s): August Saucer, Corrie Mckusick, MD  ASSISTANT: magnant pa  ANESTHESIA:   general  EBL: 15 ml    Total I/O In: 250 [IV Piggyback:250] Out: -   BLOOD ADMINISTERED: none  DRAINS: none   LOCAL MEDICATIONS USED:  toradol marcaine  SPECIMEN:  No Specimen  COUNTS:  YES  TOURNIQUET:  * No tourniquets in log *  DICTATION: .Other Dictation: Dictation Number [id number]  PLAN OF CARE: Discharge to home after PACU  PATIENT DISPOSITION:  PACU - hemodynamically stable

## 2024-01-29 NOTE — Op Note (Signed)
 [name], BECKWORTH MEDICAL RECORD NO: [id number] ACCOUNT NO: [id number] DATE OF BIRTH: 12-12-1960 FACILITY: [facility] LOCATION: [facility] PHYSICIAN: Graylin Shiver. August Saucer, MD  Operative Report   DATE OF PROCEDURE: 01/29/2024  PREOPERATIVE DIAGNOSES: 1.  Right shoulder adhesive capsulitis. 2.  Biceps tendinitis.  POSTOPERATIVE DIAGNOSES: 1.  Right shoulder adhesive capsulitis. 2.  Biceps tendinitis.  PROCEDURE:  Right shoulder manipulation under anesthesia with arthroscopy and limited debridement of the superior labrum and release and debridement of the rotator interval with subsequent mini open biceps tenodesis.  SURGEON:  Graylin Shiver. August Saucer, MD  ASSISTANT:  Karenann Cai, PA.  INDICATIONS:  This is a 63 year old patient with a long history of right shoulder pain who presents for operative management after explanation of the risks and benefits.  DESCRIPTION OF PROCEDURE:  The patient was brought to the operating room where general anesthetic was induced.  Preop antibiotics administered, timeout was called.  The left shoulder was examined under anesthesia and found to have a range of motion of  60/95/160.  The right shoulder was examined under anesthesia and found to have a range of motion of 30/75/120.  The arm was then manipulated into forward flexion of 160, which was comparable to her left arm.  Not too much tissue tearing was palpable or  audible, but we were able to achieve a stretch of the tissue.  Also achieved about 110 of isolated glenohumeral abduction.  The patient was then placed in the beach chair position with the head in neutral position.  Right shoulder, arm and hand were  prescrubbed with alcohol and Betadine allowed to air dry, prepped with DuraPrep solution and draped in a sterile manner.  Collier Flowers was used to seal the operative field and cover the axilla.  Next, the posterior portal was created 2 cm medial and inferior to  the posterolateral margin of the acromion.  Diagnostic  arthroscopy was performed.  Anterior portal was created under direct visualization.  There was some synovitis within the rotator interval.  Type II SLAP tear was present, which was primarily  degenerative, but it did involve the biceps tendon.  No split tear in the biceps tendon.  Rotator cuff intact.  Anterior, inferior, and posterior inferior glenohumeral ligaments intact.  Synovitis in the rotator interval was debrided along with the  superior labrum after biceps tendon release with the ArthroCare wand.  Rotator interval was then released from the base of the coracoid to the superior aspect of the subscapularis attachment.  This allowed for about 70 degrees of external rotation.   Following this, instruments were removed.  Portals were closed using 3-0 nylon.  Collier Flowers was used to cover the entire operative field.  A small incision was made off the anterolateral margin of the acromion.  Deltoid split a measured distance of 4 cm  between the anterior and middle raphe.  Marked with a #1 Vicryl suture.  Bursectomy performed.  Biceps tendon was then tenodesed after placing a FiberLoop suture around it.  Tenodesed into the bicipital groove using Arthrex knotless suture anchor  distally and then proximally.  Oversewn using 0 Vicryl suture.  Secure fixation was achieved of the biceps tendon.  Next, the region was thoroughly irrigated.  The deltoid was split closed using #1 Vicryl suture followed by interrupted inverted 0 Vicryl  suture, 2-0 Vicryl suture, and 3-0 Monocryl.  Steri-Strips, and impervious dressing applied.  Luke's assistance was required for opening and closing mobilization of tissue.  His assistance was of medical necessity.   PUS  D: 01/29/2024 9:16:51 am T: 01/29/2024 3:31:00 pm  JOB: [id number]/ [id number]

## 2024-01-29 NOTE — Anesthesia Procedure Notes (Signed)
 Procedure Name: Intubation Date/Time: 01/29/2024 7:48 AM  Performed by: Hali Marry, CRNAPre-anesthesia Checklist: Patient identified, Emergency Drugs available, Suction available and Patient being monitored Patient Re-evaluated:Patient Re-evaluated prior to induction Oxygen Delivery Method: Circle system utilized Preoxygenation: Pre-oxygenation with 100% oxygen Induction Type: IV induction Ventilation: Mask ventilation without difficulty Laryngoscope Size: Glidescope and 3 Grade View: Grade I Tube type: Oral Tube size: 7.0 mm Number of attempts: 1 Airway Equipment and Method: Stylet and Oral airway Placement Confirmation: ETT inserted through vocal cords under direct vision, positive ETCO2 and breath sounds checked- equal and bilateral Secured at: 21 cm Tube secured with: Tape Dental Injury: Teeth and Oropharynx as per pre-operative assessment

## 2024-01-29 NOTE — Anesthesia Procedure Notes (Signed)
 Anesthesia Regional Block: Interscalene brachial plexus block   Pre-Anesthetic Checklist: , timeout performed,  Correct Patient, Correct Site, Correct Laterality,  Correct Procedure, Correct Position, site marked,  Risks and benefits discussed,  Surgical consent,  Pre-op evaluation,  At surgeon's request and post-op pain management  Laterality: Right  Prep: chloraprep       Needles:  Injection technique: Single-shot  Needle Type: Echogenic Stimulator Needle     Needle Length: 5cm  Needle Gauge: 22     Additional Needles:   Procedures:, nerve stimulator,,,,,     Nerve Stimulator or Paresthesia:  Response: biceps flexion, 0.45 mA  Additional Responses:   Narrative:  Start time: 01/29/2024 7:03 AM End time: 01/29/2024 7:14 AM Injection made incrementally with aspirations every 5 mL.  Performed by: Personally  Anesthesiologist: Achille Rich, MD  Additional Notes: Functioning IV was confirmed and monitors were applied.  A 50mm 22ga Arrow echogenic stimulator needle was used. Sterile prep and drape,hand hygiene and sterile gloves were used.  Negative aspiration and negative test dose prior to incremental administration of local anesthetic. The patient tolerated the procedure well.  Ultrasound guidance: relevent anatomy identified, needle position confirmed, local anesthetic spread visualized around nerve(s), vascular puncture avoided.  Image printed for medical record.

## 2024-01-29 NOTE — Anesthesia Preprocedure Evaluation (Signed)
 Anesthesia Evaluation  Patient identified by MRN, date of birth, ID band Patient awake    Reviewed: Allergy & Precautions, H&P , NPO status , Patient's Chart, lab work & pertinent test results  Airway Mallampati: II   Neck ROM: full    Dental   Pulmonary sleep apnea    breath sounds clear to auscultation       Cardiovascular hypertension,  Rhythm:regular Rate:Normal     Neuro/Psych  Headaches  Anxiety Depression       GI/Hepatic ,GERD  ,,  Endo/Other  diabetes, Type 2Hypothyroidism    Renal/GU      Musculoskeletal   Abdominal   Peds  Hematology   Anesthesia Other Findings   Reproductive/Obstetrics                             Anesthesia Physical Anesthesia Plan  ASA: 3  Anesthesia Plan: General   Post-op Pain Management: Regional block*   Induction: Intravenous  PONV Risk Score and Plan: 3 and Ondansetron, Dexamethasone, Midazolam and Treatment may vary due to age or medical condition  Airway Management Planned: Oral ETT  Additional Equipment:   Intra-op Plan:   Post-operative Plan: Extubation in OR  Informed Consent: I have reviewed the patients History and Physical, chart, labs and discussed the procedure including the risks, benefits and alternatives for the proposed anesthesia with the patient or authorized representative who has indicated his/her understanding and acceptance.     Dental advisory given  Plan Discussed with: CRNA, Anesthesiologist and Surgeon  Anesthesia Plan Comments:        Anesthesia Quick Evaluation

## 2024-01-29 NOTE — H&P (Signed)
 [name] is an 63 y.o. female.   Chief Complaint: right shoulder stiffness  HPI: [name] is a 63 y.o. female who presents with several month history of shoulder pain and stiffness. Had recently had mri scan.  Continues to complain mainly of right shoulder pain.  Has continued pain that has been progressively worsening.  Taking occasional tramadol to help with sleeping.  Pain is getting in the way of sleeping if it was not for the tramadol.  She localizes pain primarily to the anterior aspect of the right shoulder as well as in the mid humeral region laterally.  She denies any significant weakness, primarily has complaint of pain and some stiffness. Has had injections with marginal relief.   MRI results revealed:  MRI Results  MR SHOULDER RIGHT W CONTRAST Result Date: 01/12/2024 CLINICAL DATA:  Right shoulder pain, limited range of motion for 3 months EXAM: MRI OF THE RIGHT SHOULDER WITH CONTRAST TECHNIQUE: Multiplanar, multisequence MR imaging of the right shoulder was performed following the administration of intra-articular contrast. CONTRAST:  See Injection Documentation. COMPARISON:  None Available. FINDINGS: Rotator cuff: Supraspinatus tendon is intact. Mild infraspinatus tendinosis. Teres minor tendon is intact. Subscapularis tendon is intact. Muscles: No muscle atrophy or edema. No intramuscular fluid collection or hematoma. Biceps Long Head: Mild tendinosis of the intra-articular portion of the long head of the biceps tendon. Acromioclavicular Joint: Mild arthropathy of the acromioclavicular joint. No subacromial/subdeltoid bursal fluid. Glenohumeral Joint: Intraarticular contrast distending the joint capsule. Normal glenohumeral ligaments. Mild partial-thickness cartilage loss of the glenohumeral joint. Mild thickening of the inferior joint capsule as can be seen with adhesive capsulitis. Labrum: Intact. Bones: No fracture or dislocation. No aggressive osseous lesion. Other: No fluid  collection or hematoma. IMPRESSION: 1. Mild infraspinatus tendinosis. 2. Mild tendinosis of the intra-articular portion of the long head of the biceps tendon. 3. Mild thickening of the inferior joint capsule as can be seen with adhesive capsulitis.     Past Medical History:  Diagnosis Date   Anxiety    Arthritis    Depression    Diabetes mellitus without complication (HCC)    GERD (gastroesophageal reflux disease)    Hyperlipidemia    Hypertension    Hypothyroidism    Migraines    Thyroid disease     Past Surgical History:  Procedure Laterality Date   ANTERIOR CERVICAL DECOMP/DISCECTOMY FUSION N/A 03/11/2023   Procedure: C4-5, C5-6, C6-7 ANTERIOR CERVICAL DECOMPRESSION/DISCECTOMY FUSION 3 LEVELS;  Surgeon: London Sheer, MD;  Location: [facility] OR;  Service: Orthopedics;  Laterality: N/A;   BREAST CYST EXCISION Right 1980   BREAST CYST EXCISION Left 1985   NO PAST SURGERIES      Family History  Problem Relation Age of Onset   Headache Neg Hx    Colon cancer Neg Hx    Esophageal cancer Neg Hx    Rectal cancer Neg Hx    Stomach cancer Neg Hx    Breast cancer Neg Hx    Social History:  reports that she has never smoked. She has never used smokeless tobacco. She reports that she does not drink alcohol and does not use drugs.  Allergies:  Allergies  Allergen Reactions   Iron Diarrhea   Metformin And Related Nausea And Vomiting   Penicillins Nausea And Vomiting    Medications Prior to Admission  Medication Sig Dispense Refill   acetaminophen (TYLENOL) 500 MG tablet Take 1,000 mg by mouth daily as needed for mild pain (pain score 1-3)  or moderate pain (pain score 4-6).     amitriptyline (ELAVIL) 50 MG tablet Take 1 tablet (50 mg total) by mouth at bedtime. (Patient taking differently: Take 50 mg by mouth at bedtime as needed for sleep.) 90 tablet 1   amLODipine (NORVASC) 10 MG tablet TAKE ONE TABLET BY MOUTH EVERY DAY 90 tablet 1   atorvastatin (LIPITOR) 80 MG tablet TAKE ONE  TABLET BY MOUTH DAILY 30 tablet 0   busPIRone (BUSPAR) 10 MG tablet Take 10 mg by mouth daily.     cyclobenzaprine (FLEXERIL) 10 MG tablet Take 10 mg by mouth daily.     DULoxetine (CYMBALTA) 30 MG capsule Take 1 capsule (30 mg total) by mouth 2 (two) times daily. (Patient taking differently: Take 30 mg by mouth daily.) 180 capsule 1   levothyroxine (SYNTHROID) 50 MCG tablet TAKE ONE TABLET BY MOUTH EVERY MORNING 90 tablet 1   lisinopril (ZESTRIL) 20 MG tablet Take 1 tablet (20 mg total) by mouth daily. 90 tablet 0   lurasidone (LATUDA) 20 MG TABS tablet Take 20 mg by mouth at bedtime.     omeprazole (PRILOSEC) 20 MG capsule Take 1 capsule (20 mg total) by mouth daily. 90 capsule 0   propranolol (INDERAL) 80 MG tablet Take 1 tablet (80 mg total) by mouth 2 (two) times daily. (Patient taking differently: Take 80 mg by mouth 3 (three) times daily.) 180 tablet 0   traMADol (ULTRAM) 50 MG tablet Take 1 tablet (50 mg total) by mouth at bedtime as needed. Do not take with Tylenol with Codeine (Patient taking differently: Take 50 mg by mouth daily as needed for moderate pain (pain score 4-6) or severe pain (pain score 7-10).) 20 tablet 0   TRULICITY 1.5 MG/0.5ML SOAJ INJECT 1.5mg  SUBCUTANEOUSLY Once weekly (Patient taking differently: Inject 1.5 mg into the skin once a week. Thursdays) 2 mL 6   Accu-Chek FastClix Lancets MISC USE AS DIRECTED TO CHECK BLOOD SUGAR THREE TIMES DAILY 102 each 6   ACCU-CHEK GUIDE TEST test strip USE TO TEST THREE TIMES DAILY 100 strip 6   Accu-Chek Softclix Lancets lancets Use as instructed to check blood sugar three times daily 100 each 6    Results for orders placed or performed during the hospital encounter of 01/29/24 (from the past 48 hours)  Glucose, capillary     Status: Abnormal   Collection Time: 01/29/24  5:55 AM  Result Value Ref Range   Glucose-Capillary 124 (H) 70 - 99 mg/dL    Comment: Glucose reference range applies only to samples taken after fasting for at  least 8 hours.   No results found.  Review of Systems  Musculoskeletal:  Positive for arthralgias.  All other systems reviewed and are negative.   Blood pressure 126/87, pulse 81, temperature 98.3 F (36.8 C), temperature source Oral, resp. rate 18, height 5\' 7"  (1.702 m), weight 84.8 kg, last menstrual period 06/06/2015, SpO2 97%. Physical Exam Vitals reviewed.  HENT:     Head: Normocephalic.     Nose: Nose normal.     Mouth/Throat:     Mouth: Mucous membranes are moist.  Eyes:     Pupils: Pupils are equal, round, and reactive to light.  Cardiovascular:     Rate and Rhythm: Normal rate.     Pulses: Normal pulses.  Pulmonary:     Effort: Pulmonary effort is normal.  Abdominal:     General: Abdomen is flat.  Musculoskeletal:     Cervical back: Normal range of  motion.  Skin:    General: Skin is warm.     Capillary Refill: Capillary refill takes less than 2 seconds.  Neurological:     General: No focal deficit present.     Mental Status: She is alert.  Psychiatric:        Mood and Affect: Mood normal.    Ortho exam demonstrates right shoulder with 30 degrees X rotation, 70 degrees abduction, 145 degrees forward elevation passively and actively.  This compared with the left shoulder with 60 degrees X rotation, 110 degrees abduction, 175 degrees forward elevation passively and actively.  Intact EPL, FPL, finger abduction, pronation/abduction, bicep, tricep, deltoid of the right arm.  Excellent rotator cuff strength of supra, infra, subscap rated 5/5.  Cellulitis or skin changes noted over the right shoulder region.  2+ radial pulse of the right upper extremity.  She has no significant pain reproduced with neck range of motion.  Positive O'Brien sign.  Tenderness over the bicipital groove rated moderate.  Minimal tenderness over the right AC joint which is slightly increased over the left [facility] joint which is nontender.    Assessment/Plan [name] is a 63 y.o. female who  presents to the office for review of right shoulder MRI.  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  We have tried glenohumeral injections in the past that have provided temporary relief but never significant resolution of her symptoms.   her right shoulder is getting more stiff relative to past visits and at this point patient would like definitive relief of her symptoms.  She would like to proceed with right shoulder manipulation under anesthesia with arthroscopic rotator interval release.  We will also take a look at the bicep tendon anchor with a very low threshold for mini open bicep tenodesis if there is any abnormality noted due to her history of pain in the past localizing to the bicipital groove and positive O'Brien sign on exam today.  We discussed the risks and benefits of the procedure as well as the recovery timeframe. Necessity of vigorous rehab also discussed She would still like to proceed with surgical intervention.  Follow-up after procedure.   Burnard Bunting, MD 01/29/2024, 6:54 AM

## 2024-01-30 ENCOUNTER — Encounter (HOSPITAL_COMMUNITY): Payer: Self-pay | Admitting: Orthopedic Surgery

## 2024-01-30 NOTE — Anesthesia Postprocedure Evaluation (Signed)
 Anesthesia Post Note  Patient: [name]  Procedure(s) Performed: EXAM UNDER ANESTHESIA, SHOULDER, WITH MANIPULATION (Right: Shoulder) ARTHROSCOPY, SHOULDER WITH DEBRIDEMENT (Right) REPAIR, TENDON, BICEPS, PROXIMAL (Right)     Patient location during evaluation: PACU Anesthesia Type: General and Regional Level of consciousness: awake and alert Pain management: pain level controlled Vital Signs Assessment: post-procedure vital signs reviewed and stable Respiratory status: spontaneous breathing, nonlabored ventilation, respiratory function stable and patient connected to nasal cannula oxygen Cardiovascular status: blood pressure returned to baseline and stable Postop Assessment: no apparent nausea or vomiting Anesthetic complications: no   No notable events documented.  Last Vitals:  Vitals:   01/29/24 1015 01/29/24 1030  BP: 129/66 119/63  Pulse: 68 69  Resp: 15 17  Temp:  36.6 C  SpO2: 95% 97%    Last Pain:  Vitals:   01/29/24 1015  TempSrc:   PainSc: 0-No pain                 Mahalie Kanner S

## 2024-01-31 DIAGNOSIS — S43431A Superior glenoid labrum lesion of right shoulder, initial encounter: Secondary | ICD-10-CM

## 2024-01-31 DIAGNOSIS — M7521 Bicipital tendinitis, right shoulder: Secondary | ICD-10-CM

## 2024-01-31 DIAGNOSIS — M65911 Unspecified synovitis and tenosynovitis, right shoulder: Secondary | ICD-10-CM

## 2024-02-03 DIAGNOSIS — F413 Other mixed anxiety disorders: Secondary | ICD-10-CM | POA: Diagnosis not present

## 2024-02-03 DIAGNOSIS — F3181 Bipolar II disorder: Secondary | ICD-10-CM | POA: Diagnosis not present

## 2024-02-04 DIAGNOSIS — F331 Major depressive disorder, recurrent, moderate: Secondary | ICD-10-CM | POA: Diagnosis not present

## 2024-02-05 ENCOUNTER — Ambulatory Visit (INDEPENDENT_AMBULATORY_CARE_PROVIDER_SITE_OTHER): Admitting: Surgical

## 2024-02-05 ENCOUNTER — Encounter: Payer: Self-pay | Admitting: Surgical

## 2024-02-05 DIAGNOSIS — M7521 Bicipital tendinitis, right shoulder: Secondary | ICD-10-CM

## 2024-02-06 ENCOUNTER — Other Ambulatory Visit: Payer: Self-pay | Admitting: Orthopedic Surgery

## 2024-02-08 ENCOUNTER — Encounter: Payer: Self-pay | Admitting: Surgical

## 2024-02-08 NOTE — Progress Notes (Signed)
 Post-Op Visit Note   Patient: [name]           Date of Birth: 1961-03-14           MRN: [id number] Visit Date: 02/05/2024 PCP: Lawrance Presume, MD   Assessment & Plan:  Chief Complaint:  Chief Complaint  Patient presents with   Right Shoulder - Routine Post Op    01/29/2024 Right shoulder MUA   Visit Diagnoses: No diagnosis found.  Plan: Patient is a 63 year old female who presents s/p right shoulder manipulation under anesthesia with mini open bicep tenodesis on/10/25.  Doing okay overall.  Start physical therapy this coming Monday.  Does note that shoulder is sore.  Using oxycodone , Celebrex  with some relief as well as over-the-counter IcyHot.  No chest pain or shortness of breath.  Just complains of shoulder soreness.  On exam, patient has 15 degrees X rotation, 60 degrees abduction, 120 degrees forward elevation passively.  Bicep contour looks appropriate.  Incisions look to be healing well with sutures intact.  Sutures removed and replaced with Steri-Strips today.  She has 2+ radial pulse of the operative extremity.  Intact EPL, FPL, finger abduction, pronation/condition, bicep, tricep, deltoid.  Axillary nerve is intact with deltoid firing.  Plan is continue with physical therapy starting on 4/21.  She is okay for getting rid of the sling and okay to lift her arm under his own power as well as doing a small amount of light lifting up to about the weight of the cell phone but no more than this.  No range of motion behind her back but otherwise okay for full range of motion actively.  Primarily need to work on as much range of motion as possible for the next month or so and then can start more strengthening exercises at her next visit.  Follow-up in 4 weeks.  Call with any concerns.  Follow-Up Instructions: No follow-ups on file.   Orders:  No orders of the defined types were placed in this encounter.  No orders of the defined types were placed in this  encounter.   Imaging: No results found.  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   Brain atrophy (HCC) 12/18/2022   Complete tear of right rotator cuff 05/16/2022   Adhesive capsulitis of right shoulder 03/14/2022   Influenza vaccine needed 10/31/2020   Need for diphtheria-tetanus-pertussis (Tdap) vaccine 10/31/2020   Hyperlipidemia associated with type 2 diabetes mellitus (HCC) 10/31/2020   Overweight (BMI 25.0-29.9) 10/31/2020   Type 2 diabetes mellitus without complication, without long-term current use of insulin  (HCC) 07/31/2020   OSA (obstructive sleep apnea) 02/02/2020   Spondylosis without myelopathy or radiculopathy, lumbar region 09/17/2017   Essential hypertension 09/05/2017   Chronic bilateral low back pain with left-sided sciatica 09/05/2017   HTN (hypertension) 05/05/2017   Hypothyroidism 02/24/2017   Mixed hyperlipidemia 02/24/2017   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   Sleep related headaches 01/23/2017   Snoring 01/23/2017   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   Chronic headaches 10/30/2016   Obesity (BMI 30.0-34.9) 05/24/2016   PMB (postmenopausal bleeding) 05/24/2016   Back pain of lumbar region with sciatica 09/28/2015   Past Medical History:  Diagnosis Date   Anxiety    Arthritis    Depression    Diabetes mellitus without  complication (HCC)    GERD (gastroesophageal reflux disease)    Hyperlipidemia    Hypertension    Hypothyroidism    Migraines    Thyroid  disease     Family History  Problem Relation Age of Onset   Headache Neg Hx    Colon cancer Neg Hx    Esophageal cancer Neg Hx    Rectal cancer Neg Hx    Stomach cancer Neg Hx    Breast cancer Neg Hx     Past Surgical History:  Procedure Laterality  Date   ANTERIOR CERVICAL DECOMP/DISCECTOMY FUSION N/A 03/11/2023   Procedure: C4-5, C5-6, C6-7 ANTERIOR CERVICAL DECOMPRESSION/DISCECTOMY FUSION 3 LEVELS;  Surgeon: Diedra Fowler, MD;  Location: [facility] OR;  Service: Orthopedics;  Laterality: N/A;   BICEPS TENDON REPAIR Right 01/29/2024   Procedure: REPAIR, TENDON, BICEPS, PROXIMAL;  Surgeon: Jasmine Mesi, MD;  Location: [facility] OR;  Service: Orthopedics;  Laterality: Right;   BREAST CYST EXCISION Right 1980   BREAST CYST EXCISION Left 1985   EXAM UNDER ANESTHESIA WITH MANIPULATION OF SHOULDER Right 01/29/2024   Procedure: EXAM UNDER ANESTHESIA, SHOULDER, WITH MANIPULATION;  Surgeon: Jasmine Mesi, MD;  Location: [facility] OR;  Service: Orthopedics;  Laterality: Right;  RIGHT SHOULDER MUA with ARTHROSCOPC ROTATOR INTERVAL RELEASE AND POSSIBLE MINI OPEN BICEP TENODESIS   NO PAST SURGERIES     POSTERIOR LUMBAR FUSION 2 WITH HARDWARE REMOVAL Right 01/29/2024   Procedure: ARTHROSCOPY, SHOULDER WITH DEBRIDEMENT;  Surgeon: Jasmine Mesi, MD;  Location: [facility] OR;  Service: Orthopedics;  Laterality: Right;  RIGHT SHOULDER MUA with ARTHROSCOPC ROTATOR INTERVAL RELEASE AND POSSIBLE MINI OPEN BICEP TENODESIS   Social History   Occupational History   Occupation: Unemployed  Tobacco Use   Smoking status: Never   Smokeless tobacco: Never  Vaping Use   Vaping status: Never Used  Substance and Sexual Activity   Alcohol use: No    Alcohol/week: 0.0 standard drinks of alcohol   Drug use: No   Sexual activity: Not Currently    Partners: Male

## 2024-02-08 NOTE — Therapy (Deleted)
 OUTPATIENT PHYSICAL THERAPY SHOULDER EVALUATION   Patient Name: [name] MRN: [id number] DOB:1961-09-30, 63 y.o., female Today's Date: 02/08/2024  END OF SESSION:   Past Medical History:  Diagnosis Date   Anxiety    Arthritis    Depression    Diabetes mellitus without complication (HCC)    GERD (gastroesophageal reflux disease)    Hyperlipidemia    Hypertension    Hypothyroidism    Migraines    Thyroid  disease    Past Surgical History:  Procedure Laterality Date   ANTERIOR CERVICAL DECOMP/DISCECTOMY FUSION N/A 03/11/2023   Procedure: C4-5, C5-6, C6-7 ANTERIOR CERVICAL DECOMPRESSION/DISCECTOMY FUSION 3 LEVELS;  Surgeon: Diedra Fowler, MD;  Location: [facility] OR;  Service: Orthopedics;  Laterality: N/A;   BICEPS TENDON REPAIR Right 01/29/2024   Procedure: REPAIR, TENDON, BICEPS, PROXIMAL;  Surgeon: Jasmine Mesi, MD;  Location: [facility] OR;  Service: Orthopedics;  Laterality: Right;   BREAST CYST EXCISION Right 1980   BREAST CYST EXCISION Left 1985   EXAM UNDER ANESTHESIA WITH MANIPULATION OF SHOULDER Right 01/29/2024   Procedure: EXAM UNDER ANESTHESIA, SHOULDER, WITH MANIPULATION;  Surgeon: Jasmine Mesi, MD;  Location: [facility] OR;  Service: Orthopedics;  Laterality: Right;  RIGHT SHOULDER MUA with ARTHROSCOPC ROTATOR INTERVAL RELEASE AND POSSIBLE MINI OPEN BICEP TENODESIS   NO PAST SURGERIES     POSTERIOR LUMBAR FUSION 2 WITH HARDWARE REMOVAL Right 01/29/2024   Procedure: ARTHROSCOPY, SHOULDER WITH DEBRIDEMENT;  Surgeon: Jasmine Mesi, MD;  Location: [facility] OR;  Service: Orthopedics;  Laterality: Right;  RIGHT SHOULDER MUA with ARTHROSCOPC ROTATOR INTERVAL RELEASE AND POSSIBLE MINI OPEN BICEP TENODESIS   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   Brain atrophy  (HCC) 12/18/2022   Complete tear of right rotator cuff 05/16/2022   Adhesive capsulitis of right shoulder 03/14/2022   Influenza vaccine needed 10/31/2020   Need for diphtheria-tetanus-pertussis (Tdap) vaccine 10/31/2020   Hyperlipidemia associated with type 2 diabetes mellitus (HCC) 10/31/2020   Overweight (BMI 25.0-29.9) 10/31/2020   Type 2 diabetes mellitus without complication, without long-term current use of insulin  (HCC) 07/31/2020   OSA (obstructive sleep apnea) 02/02/2020   Spondylosis without myelopathy or radiculopathy, lumbar region 09/17/2017   Essential hypertension 09/05/2017   Chronic bilateral low back pain with left-sided sciatica 09/05/2017   HTN (hypertension) 05/05/2017   Hypothyroidism 02/24/2017   Mixed hyperlipidemia 02/24/2017   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   Sleep related headaches 01/23/2017   Snoring 01/23/2017   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   Chronic headaches 10/30/2016   Obesity (BMI 30.0-34.9) 05/24/2016   PMB (postmenopausal bleeding) 05/24/2016   Back pain of lumbar region with sciatica 09/28/2015    PCP: Lawrance Presume, MD   REFERRING PROVIDER: Casilda Clayman, PA-C  REFERRING DIAG: M75.01 (ICD-10-CM) - Adhesive capsulitis of right shoulder M75.21 (ICD-10-CM) - Biceps tendinitis of right shoulder  THERAPY DIAG:  No diagnosis found.  Rationale for Evaluation and Treatment: Rehabilitation  ONSET DATE: 01/29/24  SUBJECTIVE:  SUBJECTIVE STATEMENT: *** Hand dominance: {MISC; OT HAND DOMINANCE:[id number]}  PERTINENT HISTORY: [name] is a 63 y.o. female who presents to the office for MRI review.  Continues to complain mainly of right shoulder pain.  Has continued pain that has been progressively worsening.  Taking occasional tramadol   to help with sleeping.  Pain is getting in the way of sleeping if it was not for the tramadol .  She localizes pain primarily to the anterior aspect of the right shoulder as well as in the mid humeral region laterally.  She denies any significant weakness, primarily has complaint of pain and some stiffness.   PAIN:  Are you having pain? {OPRCPAIN:27236}  PRECAUTIONS: {Therapy precautions:24002}  RED FLAGS: None   WEIGHT BEARING RESTRICTIONS: No  FALLS:  Has patient fallen in last 6 months? No  OCCUPATION: ***  PLOF: Independent  PATIENT GOALS:To manage my shoulder symptoms  NEXT MD VISIT:   OBJECTIVE:  Note: Objective measures were completed at Evaluation unless otherwise noted.  DIAGNOSTIC FINDINGS:  IMPRESSION: 1. Mild infraspinatus tendinosis. 2. Mild tendinosis of the intra-articular portion of the long head of the biceps tendon. 3. Mild thickening of the inferior joint capsule as can be seen with adhesive capsulitis.     Electronically Signed   By: Onnie Bilis M.D.   On: 01/12/2024 11:47  PATIENT SURVEYS:  Quick Dash ***  POSTURE: ***  UPPER EXTREMITY ROM:   {AROM/PROM:27142} ROM Right eval Left eval  Shoulder flexion    Shoulder extension    Shoulder abduction    Shoulder adduction    Shoulder internal rotation    Shoulder external rotation    Elbow flexion    Elbow extension    Wrist flexion    Wrist extension    Wrist ulnar deviation    Wrist radial deviation    Wrist pronation    Wrist supination    (Blank rows = not tested)  UPPER EXTREMITY MMT:  MMT Right eval Left eval  Shoulder flexion    Shoulder extension    Shoulder abduction    Shoulder adduction    Shoulder internal rotation    Shoulder external rotation    Middle trapezius    Lower trapezius    Elbow flexion    Elbow extension    Wrist flexion    Wrist extension    Wrist ulnar deviation    Wrist radial deviation    Wrist pronation    Wrist supination    Grip  strength (lbs)    (Blank rows = not tested)  SHOULDER SPECIAL TESTS: Deferred due to post op status  JOINT MOBILITY TESTING:  Deferred due to post op status  PALPATION:  ***                                                                                                                             TREATMENT DATE: ***   PATIENT EDUCATION: Education details: Discussed eval findings, rehab  rationale and POC and patient is in agreement  Person educated: Patient Education method: Explanation Education comprehension: verbalized understanding and needs further education  HOME EXERCISE PROGRAM: ***  ASSESSMENT:  CLINICAL IMPRESSION: Patient is a 63 y.o. female who was seen today for physical therapy evaluation and treatment for ***.   OBJECTIVE IMPAIRMENTS: {opptimpairments:25111}.   ACTIVITY LIMITATIONS: {activitylimitations:27494}  PARTICIPATION LIMITATIONS: {participationrestrictions:25113}  PERSONAL FACTORS: Age, Behavior pattern, Fitness, Past/current experiences, and Time since onset of injury/illness/exacerbation are also affecting patient's functional outcome.   REHAB POTENTIAL: Good  CLINICAL DECISION MAKING: Evolving/moderate complexity  EVALUATION COMPLEXITY: Moderate   GOALS: Goals reviewed with patient? No  SHORT TERM GOALS: Target date: ***  Patient to demonstrate independence in HEP  Baseline: Goal status: INITIAL  2.  *** Baseline:  Goal status: INITIAL  3.  *** Baseline:  Goal status: INITIAL  4.  *** Baseline:  Goal status: INITIAL  5.  *** Baseline:  Goal status: INITIAL  6.  *** Baseline:  Goal status: INITIAL  LONG TERM GOALS: Target date: ***  Patient will score at least ***% on FOTO to signify clinically meaningful improvement in functional abilities.   Baseline:  Goal status: INITIAL  2.  Patient will acknowledge ***/10 pain at least once during episode of care   Baseline:  Goal status: INITIAL  3.  *** Baseline:   Goal status: INITIAL  4.  *** Baseline:  Goal status: INITIAL  5.  *** Baseline:  Goal status: INITIAL  6.  *** Baseline:  Goal status: INITIAL  PLAN:  PT FREQUENCY: 2x/week  PT DURATION: 6 weeks  PLANNED INTERVENTIONS: 97164- PT Re-evaluation, 97110-Therapeutic exercises, 97530- Therapeutic activity, 97112- Neuromuscular re-education, 97535- Self Care, 16109- Manual therapy, Patient/Family education, and Joint mobilization  PLAN FOR NEXT SESSION: HEP review and update, manual techniques as appropriate, aerobic tasks, ROM and flexibility activities, strengthening and PREs, TPDN, gait and balance training as needed     Eldon Greenland, PT 02/08/2024, 11:23 AM

## 2024-02-09 ENCOUNTER — Ambulatory Visit

## 2024-02-10 NOTE — Therapy (Signed)
 OUTPATIENT PHYSICAL THERAPY SHOULDER EVALUATION   Patient Name: [name] MRN: [id number] DOB:01/04/1961, 63 y.o., female Today's Date: 02/12/2024  END OF SESSION:  PT End of Session - 02/12/24 0757     Visit Number 1    Number of Visits 17    Date for PT Re-Evaluation 04/16/24    Authorization Type [place] MEDICAID [employer]    Authorization - Number of Visits 27    PT Start Time 1105    PT Stop Time 1150    PT Time Calculation (min) 45 min    Activity Tolerance Patient tolerated treatment well    Behavior During Therapy WFL for tasks assessed/performed             Past Medical History:  Diagnosis Date   Anxiety    Arthritis    Depression    Diabetes mellitus without complication (HCC)    GERD (gastroesophageal reflux disease)    Hyperlipidemia    Hypertension    Hypothyroidism    Migraines    Thyroid  disease    Past Surgical History:  Procedure Laterality Date   ANTERIOR CERVICAL DECOMP/DISCECTOMY FUSION N/A 03/11/2023   Procedure: C4-5, C5-6, C6-7 ANTERIOR CERVICAL DECOMPRESSION/DISCECTOMY FUSION 3 LEVELS;  Surgeon: Diedra Fowler, MD;  Location: [facility] OR;  Service: Orthopedics;  Laterality: N/A;   BICEPS TENDON REPAIR Right 01/29/2024   Procedure: REPAIR, TENDON, BICEPS, PROXIMAL;  Surgeon: Jasmine Mesi, MD;  Location: [facility] OR;  Service: Orthopedics;  Laterality: Right;   BREAST CYST EXCISION Right 1980   BREAST CYST EXCISION Left 1985   EXAM UNDER ANESTHESIA WITH MANIPULATION OF SHOULDER Right 01/29/2024   Procedure: EXAM UNDER ANESTHESIA, SHOULDER, WITH MANIPULATION;  Surgeon: Jasmine Mesi, MD;  Location: [facility] OR;  Service: Orthopedics;  Laterality: Right;  RIGHT SHOULDER MUA with ARTHROSCOPC ROTATOR INTERVAL RELEASE AND POSSIBLE MINI OPEN BICEP TENODESIS   NO PAST SURGERIES     POSTERIOR LUMBAR FUSION 2 WITH HARDWARE REMOVAL Right 01/29/2024   Procedure: ARTHROSCOPY, SHOULDER WITH DEBRIDEMENT;  Surgeon: Jasmine Mesi, MD;   Location: [facility];  Service: Orthopedics;  Laterality: Right;  RIGHT SHOULDER MUA with ARTHROSCOPC ROTATOR INTERVAL RELEASE AND POSSIBLE MINI OPEN BICEP TENODESIS   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   Brain atrophy (HCC) 12/18/2022   Complete tear of right rotator cuff 05/16/2022   Adhesive capsulitis of right shoulder 03/14/2022   Influenza vaccine needed 10/31/2020   Need for diphtheria-tetanus-pertussis (Tdap) vaccine 10/31/2020   Hyperlipidemia associated with type 2 diabetes mellitus (HCC) 10/31/2020   Overweight (BMI 25.0-29.9) 10/31/2020   Type 2 diabetes mellitus without complication, without long-term current use of insulin  (HCC) 07/31/2020   OSA (obstructive sleep apnea) 02/02/2020   Spondylosis without myelopathy or radiculopathy, lumbar region 09/17/2017   Essential hypertension 09/05/2017   Chronic bilateral low back pain with left-sided sciatica 09/05/2017   HTN (hypertension) 05/05/2017   Hypothyroidism 02/24/2017   Mixed hyperlipidemia 02/24/2017   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   Sleep related headaches 01/23/2017   Snoring 01/23/2017   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   Chronic headaches 10/30/2016   Obesity (BMI 30.0-34.9) 05/24/2016   PMB (postmenopausal bleeding) 05/24/2016   Back pain of lumbar region with sciatica 09/28/2015    PCP: Lawrance Presume, MD  REFERRING PROVIDER: Casilda Clayman,  PA-C  REFERRING DIAG:  M75.01 (ICD-10-CM) - Adhesive capsulitis of right shoulder  M75.21 (ICD-10-CM) - Biceps tendinitis of right shoulder    THERAPY DIAG:  Acute pain of right shoulder  Stiffness of right shoulder, not elsewhere classified  Muscle weakness (generalized)  Rationale for Evaluation and Treatment:  Rehabilitation  ONSET DATE: 01/29/24 DOS  SUBJECTIVE:                                                                                                                                                                                      SUBJECTIVE STATEMENT: Pt reports R shoulder is feeling some better. Has a return appt to see surgeon in 1 month. Pt notes intermittent N/T along the post/lat aspect of her R arm to her hand  Hand dominance: Right  PERTINENT HISTORY: DM, anxiety, depression  PAIN:  Are you having pain?Yes: NPRS scale: 2/10. Pain range the week prior to start of Pt: 2-10/10 Pain location: R shoulder Pain description: Throb, constant Aggravating factors: Moving her shoulder the wrong way, Increased pain with sleeping Relieving factors: Rest, support  PRECAUTIONS: PROM and AAROM but no full AROM or ROM behind the back and no strengthening/lifting exercises right shoulder Post op 01/29/2024  RED FLAGS: None   WEIGHT BEARING RESTRICTIONS: No  FALLS:  Has patient fallen in last 6 months? No  LIVING ENVIRONMENT: Lives with: lives with their family Lives in: House/apartment Able to access home  OCCUPATION: Retired. Likes to shop, go to movies, go out to eat  PLOF: Independent with basic ADLs  PATIENT GOALS:Good use of the R shoulder/arm  NEXT MD VISIT:   OBJECTIVE:  Note: Objective measures were completed at Evaluation unless otherwise noted.  PATIENT SURVEYS:  Quick Dash 39/55= 71%  COGNITION: Overall cognitive status: Within functional limits for tasks assessed     SENSATION: WFL  POSTURE: Forward head, round shoulder, and flexed trunk  UPPER EXTREMITY ROM:   Active ROM Right eval Left eval  Shoulder flexion AA 90 A 120   Shoulder extension    Shoulder abduction/scaption AA 80A A 115   Shoulder adduction    Shoulder internal rotation NT A T11  Shoulder external rotation AA 35 A T3  Elbow flexion    Elbow extension    Wrist flexion     Wrist extension    Wrist ulnar deviation    Wrist radial deviation    Wrist pronation    Wrist supination    (Blank rows = not tested)  UPPER EXTREMITY MMT:  NT MMT Right eval Left eval  Shoulder flexion    Shoulder extension    Shoulder abduction    Shoulder adduction  Shoulder internal rotation    Shoulder external rotation    Middle trapezius    Lower trapezius    Elbow flexion    Elbow extension    Wrist flexion    Wrist extension    Wrist ulnar deviation    Wrist radial deviation    Wrist pronation    Wrist supination    Grip strength (lbs)    (Blank rows = not tested)  SHOULDER SPECIAL TESTS: NT  JOINT MOBILITY TESTING:  NT  PALPATION:  TTP throughout the R GH region                                                                                                                    TREATMENT DATE:  [facility] Adult PT Treatment:                                                DATE: 02/11/23 Therapeutic Exercise: Developed, instructed in, and pt completed therex as noted in HEP  Self Care: Use of cold pack for 10 mins periodically for symptom management    PATIENT EDUCATION: Education details: Eval findings, POC, HEP, self care  Person educated: Patient Education method: Explanation, Demonstration, Tactile cues, Verbal cues, and Handouts Education comprehension: verbalized understanding, returned demonstration, verbal cues required, and tactile cues required  HOME EXERCISE PROGRAM: Access Code: [email or web address] URL: [email or web address] Date: 02/11/2024 Prepared by: Liborio Reeds  Exercises - Seated Shoulder Flexion Towel Slide at Table Top  - 3 x daily - 7 x weekly - 1 sets - 10 reps - 3 hold - Seated Shoulder Abduction Towel Slide at Table Top (Mirrored)  - 3 x daily - 7 x weekly - 1 sets - 10 reps - 3 hold - Seated Shoulder External Rotation PROM on Table (Mirrored)  - 3 x daily - 7 x weekly - 1 sets - 10 reps - 3 hold  ASSESSMENT:  CLINICAL  IMPRESSION: Patient is a 63 y.o. female who was seen today for physical therapy evaluation and treatment for M75.01 (ICD-10-CM) - Adhesive capsulitis of right shoulder  M75.21 (ICD-10-CM) - Biceps tendinitis of right shoulder  s/p right shoulder manipulation under anesthesia with mini open bicep tenodesis  PROM and AAROM but no full AROM or ROM behind the back and no strengthening/lifting exercises right shoulder. Post op 01/29/2024. Pt presents with R shoulder ROM limitations consistent with her surgical procedure. Pt was advised against lifting her R hand above chest level which she reports attempting to try in order to exercise her shoulder on her own. A HEP was initiated. Pt will benefit from skilled PT to address impairments to optimize R shoulder function with less pain.   OBJECTIVE IMPAIRMENTS: decreased ROM, decreased strength, impaired UE functional use, and pain.   ACTIVITY LIMITATIONS: carrying, lifting, bathing, toileting, dressing, reach over head, hygiene/grooming, and caring for others  PARTICIPATION LIMITATIONS: meal  prep, cleaning, laundry, driving, shopping, and community activity  PERSONAL FACTORS: Past/current experiences, Time since onset of injury/illness/exacerbation, and 3+ comorbidities: DM, anxiety, depression  are also affecting patient's functional outcome.   REHAB POTENTIAL: Good  CLINICAL DECISION MAKING: Evolving/moderate complexity  EVALUATION COMPLEXITY: Moderate   GOALS:  SHORT TERM GOALS: Target date: 02/27/24  Pt will be Ind in an initial HEP Baseline: started Goal status: INITIAL  2.  The R shoulder will demonstrate AAROM for flexion 110d, abd 100d, and ER of 50d for appropriate progression with rehab Baseline: see flow sheets Goal status: INITIAL  LONG TERM GOALS: Target date: 04/16/24  Pt will be Ind in a final HEP to maintain achieved LOF  Baseline:  Goal status: INITIAL  2.  Pt will demonstrate AROM for R shoulder to 120d, abd 100d, and ER  65d for appropriate function of the R UE Baseline: see flow sheets Goal status: INITIAL  3.  Pt will be able to lift a 3 # weight overhead for appropriate function of the R UE with daily activities Baseline:  Goal status: INITIAL  4.  Pt's Quick Dash score will improve to 40% or less as indication of improved function  Baseline: 71% Goal status: INITIAL  PLAN:  PT FREQUENCY: 2x/week  PT DURATION: 8 weeks  PLANNED INTERVENTIONS: 97164- PT Re-evaluation, 97110-Therapeutic exercises, 97530- Therapeutic activity, [id number]- Neuromuscular re-education, 97535- Self Care, 16109- Manual therapy, [id number]- Electrical stimulation (unattended), 97033- Ionotophoresis 4mg /ml Dexamethasone , Stair training, Taping, Dry Needling, Joint mobilization, Cryotherapy, and Moist heat  PLAN FOR NEXT SESSION: Assess response to HEP; progress therex as indicated; use of modalities, manual therapy; and TPDN as indicated.  Porcha Deblanc MS, PT 02/12/24 5:13 PM

## 2024-02-11 ENCOUNTER — Ambulatory Visit: Attending: Surgical

## 2024-02-11 ENCOUNTER — Other Ambulatory Visit: Payer: Self-pay

## 2024-02-11 DIAGNOSIS — M25511 Pain in right shoulder: Secondary | ICD-10-CM | POA: Insufficient documentation

## 2024-02-11 DIAGNOSIS — M25611 Stiffness of right shoulder, not elsewhere classified: Secondary | ICD-10-CM | POA: Insufficient documentation

## 2024-02-11 DIAGNOSIS — M6281 Muscle weakness (generalized): Secondary | ICD-10-CM | POA: Insufficient documentation

## 2024-02-11 DIAGNOSIS — M7501 Adhesive capsulitis of right shoulder: Secondary | ICD-10-CM | POA: Insufficient documentation

## 2024-02-11 DIAGNOSIS — M7521 Bicipital tendinitis, right shoulder: Secondary | ICD-10-CM | POA: Insufficient documentation

## 2024-02-12 NOTE — Therapy (Unsigned)
 OUTPATIENT PHYSICAL THERAPY SHOULDER EVALUATION   Patient Name: [name] MRN: [id number] DOB:[birth year], 63 y.o., female Today's Date: 02/12/2024  END OF SESSION:    Past Medical History:  Diagnosis Date   Anxiety    Arthritis    Depression    Diabetes mellitus without complication (HCC)    GERD (gastroesophageal reflux disease)    Hyperlipidemia    Hypertension    Hypothyroidism    Migraines    Thyroid  disease    Past Surgical History:  Procedure Laterality Date   ANTERIOR CERVICAL DECOMP/DISCECTOMY FUSION N/A 03/11/2023   Procedure: C4-5, C5-6, C6-7 ANTERIOR CERVICAL DECOMPRESSION/DISCECTOMY FUSION 3 LEVELS;  Surgeon: Diedra Fowler, MD;  Location: [facility] OR;  Service: Orthopedics;  Laterality: N/A;   BICEPS TENDON REPAIR Right 01/29/2024   Procedure: REPAIR, TENDON, BICEPS, PROXIMAL;  Surgeon: Jasmine Mesi, MD;  Location: [facility] OR;  Service: Orthopedics;  Laterality: Right;   BREAST CYST EXCISION Right 1980   BREAST CYST EXCISION Left 1985   EXAM UNDER ANESTHESIA WITH MANIPULATION OF SHOULDER Right 01/29/2024   Procedure: EXAM UNDER ANESTHESIA, SHOULDER, WITH MANIPULATION;  Surgeon: Jasmine Mesi, MD;  Location: [facility] OR;  Service: Orthopedics;  Laterality: Right;  RIGHT SHOULDER MUA with ARTHROSCOPC ROTATOR INTERVAL RELEASE AND POSSIBLE MINI OPEN BICEP TENODESIS   NO PAST SURGERIES     POSTERIOR LUMBAR FUSION 2 WITH HARDWARE REMOVAL Right 01/29/2024   Procedure: ARTHROSCOPY, SHOULDER WITH DEBRIDEMENT;  Surgeon: Jasmine Mesi, MD;  Location: [facility] OR;  Service: Orthopedics;  Laterality: Right;  RIGHT SHOULDER MUA with ARTHROSCOPC ROTATOR INTERVAL RELEASE AND POSSIBLE MINI OPEN BICEP TENODESIS   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   Brain  atrophy (HCC) 12/18/2022   Complete tear of right rotator cuff 05/16/2022   Adhesive capsulitis of right shoulder 03/14/2022   Influenza vaccine needed 10/31/2020   Need for diphtheria-tetanus-pertussis (Tdap) vaccine 10/31/2020   Hyperlipidemia associated with type 2 diabetes mellitus (HCC) 10/31/2020   Overweight (BMI 25.0-29.9) 10/31/2020   Type 2 diabetes mellitus without complication, without long-term current use of insulin  (HCC) 07/31/2020   OSA (obstructive sleep apnea) 02/02/2020   Spondylosis without myelopathy or radiculopathy, lumbar region 09/17/2017   Essential hypertension 09/05/2017   Chronic bilateral low back pain with left-sided sciatica 09/05/2017   HTN (hypertension) 05/05/2017   Hypothyroidism 02/24/2017   Mixed hyperlipidemia 02/24/2017   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   Sleep related headaches 01/23/2017   Snoring 01/23/2017   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   Chronic headaches 10/30/2016   Obesity (BMI 30.0-34.9) 05/24/2016   PMB (postmenopausal bleeding) 05/24/2016   Back pain of lumbar region with sciatica 09/28/2015    PCP: Lawrance Presume, MD  REFERRING PROVIDER: Casilda Clayman, PA-C  REFERRING DIAG:  M75.01 (ICD-10-CM) - Adhesive capsulitis of right shoulder  M75.21 (ICD-10-CM) - Biceps tendinitis of right shoulder    THERAPY DIAG:  No diagnosis found.  Rationale for Evaluation and Treatment: Rehabilitation  ONSET DATE: 01/29/24 DOS  SUBJECTIVE:  SUBJECTIVE STATEMENT: Pt reports R shoulder is feeling some better. Has a return appt to see surgeon in 1 month. Pt notes N/T along the post/lat aspect of her R arm to her hand  Hand dominance: Right  PERTINENT HISTORY: ***  PAIN:  Are you having pain?Yes: NPRS scale: 2/10. Pain range the week  prior to start of Pt: 2-10/10 Pain location: R shoulder Pain description: Throb, constant Aggravating factors: Moving her shoulder the wrong way, Increased pain with sleeping Relieving factors: Rest, support  PRECAUTIONS: PROM and AAROM but no full AROM or ROM behind the back and no strengthening/lifting exercises right shoulder Post op 01/29/2024  RED FLAGS: None   WEIGHT BEARING RESTRICTIONS: No  FALLS:  Has patient fallen in last 6 months? No  LIVING ENVIRONMENT: Lives with: lives with their family Lives in: House/apartment Able to access home  OCCUPATION: Retired. Likes to shop, go to movies, go out to eat  PLOF: Independent with basic ADLs  PATIENT GOALS:Good use of the R shoulder/arm  NEXT MD VISIT:   OBJECTIVE:  Note: Objective measures were completed at Evaluation unless otherwise noted.  DIAGNOSTIC FINDINGS:  ***  PATIENT SURVEYS:  Quick Dash ***  COGNITION: Overall cognitive status: Within functional limits for tasks assessed     SENSATION: WFL  POSTURE: Forward head, round shoulder, and flexed trunk  UPPER EXTREMITY ROM:   Active ROM Right eval Left eval  Shoulder flexion AA 90 A 120   Shoulder extension    Shoulder abduction/scaption AA 80A A 115   Shoulder adduction    Shoulder internal rotation  A T11  Shoulder external rotation AA 35 A T3  Elbow flexion    Elbow extension    Wrist flexion    Wrist extension    Wrist ulnar deviation    Wrist radial deviation    Wrist pronation    Wrist supination    (Blank rows = not tested)  UPPER EXTREMITY MMT:  MMT Right eval Left eval  Shoulder flexion    Shoulder extension    Shoulder abduction    Shoulder adduction    Shoulder internal rotation    Shoulder external rotation    Middle trapezius    Lower trapezius    Elbow flexion    Elbow extension    Wrist flexion    Wrist extension    Wrist ulnar deviation    Wrist radial deviation    Wrist pronation    Wrist supination     Grip strength (lbs)    (Blank rows = not tested)  SHOULDER SPECIAL TESTS: Impingement tests: {shoulder impingement test:25231:a} SLAP lesions: {SLAP lesions:25232} Instability tests: {shoulder instability test:25233} Rotator cuff assessment: {rotator cuff assessment:25234} Biceps assessment: {biceps assessment:25235}  JOINT MOBILITY TESTING:  ***  PALPATION:  ***  TREATMENT DATE:  [facility] Adult PT Treatment:                                                DATE: 02/11/23 Therapeutic Exercise: *** Manual Therapy: *** Neuromuscular re-ed: *** Therapeutic Activity: *** Modalities: *** Self Care: ***    PATIENT EDUCATION: Education details: *** Person educated: {Person educated:25204} Education method: {Education Method:25205} Education comprehension: {Education Comprehension:25206}  HOME EXERCISE PROGRAM: Access Code: [email or web address] URL: [email or web address] Date: 02/11/2024 Prepared by: Liborio Reeds  Exercises - Seated Shoulder Flexion Towel Slide at Table Top  - 3 x daily - 7 x weekly - 1 sets - 10 reps - 3 hold - Seated Shoulder Abduction Towel Slide at Table Top (Mirrored)  - 3 x daily - 7 x weekly - 1 sets - 10 reps - 3 hold - Seated Shoulder External Rotation PROM on Table (Mirrored)  - 3 x daily - 7 x weekly - 1 sets - 10 reps - 3 hold  ASSESSMENT:  CLINICAL IMPRESSION: Patient is a 63 y.o. female who was seen today for physical therapy evaluation and treatment for M75.01 (ICD-10-CM) - Adhesive capsulitis of right shoulder  M75.21 (ICD-10-CM) - Biceps tendinitis of right shoulder  EXAM UNDER ANESTHESIA, SHOULDER, WITH MANIPULATION; ARTHROSCOPY, SHOULDER WITH DEBRIDEMENT; REPAIR, TENDON, BICEPS, PROXIMAL. PROM and AAROM but no full AROM or ROM behind the back and no strengthening/lifting exercises right shoulder. Post op  01/29/2024  OBJECTIVE IMPAIRMENTS: {opptimpairments:25111}.   ACTIVITY LIMITATIONS: {activitylimitations:27494}  PARTICIPATION LIMITATIONS: {participationrestrictions:25113}  PERSONAL FACTORS: {Personal factors:25162} are also affecting patient's functional outcome.   REHAB POTENTIAL: {rehabpotential:25112}  CLINICAL DECISION MAKING: {clinical decision making:25114}  EVALUATION COMPLEXITY: {Evaluation complexity:25115}   GOALS: Goals reviewed with patient? {yes/no:20286}  SHORT TERM GOALS: Target date: ***  *** Baseline: Goal status: INITIAL  2.  *** Baseline:  Goal status: INITIAL  3.  *** Baseline:  Goal status: INITIAL  4.  *** Baseline:  Goal status: INITIAL  5.  *** Baseline:  Goal status: INITIAL  6.  *** Baseline:  Goal status: INITIAL  LONG TERM GOALS: Target date: ***  *** Baseline:  Goal status: INITIAL  2.  *** Baseline:  Goal status: INITIAL  3.  *** Baseline:  Goal status: INITIAL  4.  *** Baseline:  Goal status: INITIAL  5.  *** Baseline:  Goal status: INITIAL  6.  *** Baseline:  Goal status: INITIAL  PLAN:  PT FREQUENCY: {rehab frequency:25116}  PT DURATION: {rehab duration:25117}  PLANNED INTERVENTIONS: {rehab planned interventions:25118::"97110-Therapeutic exercises","97530- Therapeutic [phone]- Neuromuscular re-education","97535- Self JXBJ","47829- Manual therapy"}  PLAN FOR NEXT SESSION: ***   Eldon Greenland, PT 02/12/2024, 10:54 AM

## 2024-02-13 ENCOUNTER — Ambulatory Visit

## 2024-02-13 DIAGNOSIS — M6281 Muscle weakness (generalized): Secondary | ICD-10-CM

## 2024-02-13 DIAGNOSIS — M25511 Pain in right shoulder: Secondary | ICD-10-CM | POA: Diagnosis not present

## 2024-02-13 DIAGNOSIS — M25611 Stiffness of right shoulder, not elsewhere classified: Secondary | ICD-10-CM

## 2024-02-13 NOTE — Therapy (Signed)
 OUTPATIENT PHYSICAL THERAPY TREATMENT NOTE   Patient Name: [name] MRN: [id number] DOB:[birth year], 63 y.o., female Today's Date: 02/13/2024  END OF SESSION:  PT End of Session - 02/13/24 1259     Visit Number 2    Number of Visits 17    Date for PT Re-Evaluation 04/16/24    Authorization Type [place] MEDICA[employer]    Authorization - Number of Visits 27    PT Start Time 1300    PT Stop Time 1340    PT Time Calculation (min) 40 min    Activity Tolerance Patient tolerated treatment well    Behavior During Therapy WFL for tasks assessed/performed              Past Medical History:  Diagnosis Date   Anxiety    Arthritis    Depression    Diabetes mellitus without complication (HCC)    GERD (gastroesophageal reflux disease)    Hyperlipidemia    Hypertension    Hypothyroidism    Migraines    Thyroid  disease    Past Surgical History:  Procedure Laterality Date   ANTERIOR CERVICAL DECOMP/DISCECTOMY FUSION N/A 03/11/2023   Procedure: C4-5, C5-6, C6-7 ANTERIOR CERVICAL DECOMPRESSION/DISCECTOMY FUSION 3 LEVELS;  Surgeon: Diedra Fowler, MD;  Location: [facility] OR;  Service: Orthopedics;  Laterality: N/A;   BICEPS TENDON REPAIR Right 01/29/2024   Procedure: REPAIR, TENDON, BICEPS, PROXIMAL;  Surgeon: Jasmine Mesi, MD;  Location: [facility] OR;  Service: Orthopedics;  Laterality: Right;   BREAST CYST EXCISION Right 1980   BREAST CYST EXCISION Left 1985   EXAM UNDER ANESTHESIA WITH MANIPULATION OF SHOULDER Right 01/29/2024   Procedure: EXAM UNDER ANESTHESIA, SHOULDER, WITH MANIPULATION;  Surgeon: Jasmine Mesi, MD;  Location: [facility] OR;  Service: Orthopedics;  Laterality: Right;  RIGHT SHOULDER MUA with ARTHROSCOPC ROTATOR INTERVAL RELEASE AND POSSIBLE MINI OPEN BICEP TENODESIS   NO PAST SURGERIES     POSTERIOR LUMBAR FUSION 2 WITH HARDWARE REMOVAL Right 01/29/2024   Procedure: ARTHROSCOPY, SHOULDER WITH DEBRIDEMENT;  Surgeon: Jasmine Mesi, MD;  Location:  [facility] OR;  Service: Orthopedics;  Laterality: Right;  RIGHT SHOULDER MUA with ARTHROSCOPC ROTATOR INTERVAL RELEASE AND POSSIBLE MINI OPEN BICEP TENODESIS   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   Brain atrophy (HCC) 12/18/2022   Complete tear of right rotator cuff 05/16/2022   Adhesive capsulitis of right shoulder 03/14/2022   Influenza vaccine needed 10/31/2020   Need for diphtheria-tetanus-pertussis (Tdap) vaccine 10/31/2020   Hyperlipidemia associated with type 2 diabetes mellitus (HCC) 10/31/2020   Overweight (BMI 25.0-29.9) 10/31/2020   Type 2 diabetes mellitus without complication, without long-term current use of insulin  (HCC) 07/31/2020   OSA (obstructive sleep apnea) 02/02/2020   Spondylosis without myelopathy or radiculopathy, lumbar region 09/17/2017   Essential hypertension 09/05/2017   Chronic bilateral low back pain with left-sided sciatica 09/05/2017   HTN (hypertension) 05/05/2017   Hypothyroidism 02/24/2017   Mixed hyperlipidemia 02/24/2017   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   Sleep related headaches 01/23/2017   Snoring 01/23/2017   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   Chronic headaches 10/30/2016   Obesity (BMI 30.0-34.9) 05/24/2016   PMB (postmenopausal bleeding) 05/24/2016   Back pain of lumbar region with sciatica 09/28/2015    PCP: Lawrance Presume, MD  REFERRING PROVIDER: Wells Halim  L, PA-C  REFERRING DIAG:  M75.01 (ICD-10-CM) - Adhesive capsulitis of right shoulder  M75.21 (ICD-10-CM) - Biceps tendinitis of right shoulder    THERAPY DIAG:  Acute pain of right shoulder  Muscle weakness (generalized)  Stiffness of right shoulder, not elsewhere classified  Rationale for Evaluation and Treatment:  Rehabilitation  ONSET DATE: 01/29/24 DOS  SUBJECTIVE:                                                                                                                                                                                      SUBJECTIVE STATEMENT: Arrives with 2/10 shoulder soreness, 10/10 worst pain.  Hand dominance: Right  PERTINENT HISTORY: DM, anxiety, depression  PAIN:  Are you having pain?Yes: NPRS scale: 2/10. Pain range the week prior to start of Pt: 2-10/10 Pain location: R shoulder Pain description: Throb, constant Aggravating factors: Moving her shoulder the wrong way, Increased pain with sleeping Relieving factors: Rest, support  PRECAUTIONS: PROM and AAROM but no full AROM or ROM behind the back and no strengthening/lifting exercises right shoulder Post op 01/29/2024  RED FLAGS: None   WEIGHT BEARING RESTRICTIONS: No  FALLS:  Has patient fallen in last 6 months? No  LIVING ENVIRONMENT: Lives with: lives with their family Lives in: House/apartment Able to access home  OCCUPATION: Retired. Likes to shop, go to movies, go out to eat  PLOF: Independent with basic ADLs  PATIENT GOALS:Good use of the R shoulder/arm  NEXT MD VISIT:   OBJECTIVE:  Note: Objective measures were completed at Evaluation unless otherwise noted.  PATIENT SURVEYS:  Quick Dash 39/55= 71%  COGNITION: Overall cognitive status: Within functional limits for tasks assessed     SENSATION: WFL  POSTURE: Forward head, round shoulder, and flexed trunk  UPPER EXTREMITY ROM:   Active ROM Right eval Left eval  Shoulder flexion AA 90 A 120   Shoulder extension    Shoulder abduction/scaption AA 80A A 115   Shoulder adduction    Shoulder internal rotation NT A T11  Shoulder external rotation AA 35 A T3  Elbow flexion    Elbow extension    Wrist flexion    Wrist extension    Wrist ulnar deviation    Wrist radial deviation    Wrist pronation    Wrist supination     (Blank rows = not tested)  UPPER EXTREMITY MMT:  NT MMT Right eval Left eval  Shoulder flexion    Shoulder extension    Shoulder abduction    Shoulder adduction    Shoulder internal rotation    Shoulder external rotation    Middle trapezius    Lower trapezius  Elbow flexion    Elbow extension    Wrist flexion    Wrist extension    Wrist ulnar deviation    Wrist radial deviation    Wrist pronation    Wrist supination    Grip strength (lbs)    (Blank rows = not tested)  SHOULDER SPECIAL TESTS: NT  JOINT MOBILITY TESTING:  NT  PALPATION:  TTP throughout the R GH region                                                                                                                    TREATMENT DATE:  [facility] Adult PT Treatment:                                                DATE: 02/13/24 Therapeutic Exercise: OH pulley flexion 5 min Neuromuscular re-ed: 4 way scapula L side lie 15x ea R pec monor release 2 min PROM Therapeutic Activity: Supine flexion with Ranger 15x Supine press with Ranger 15x  Standing abduction with Ranger  Seated table flexion 15x Seated table abduction 15x Seated ER 15x  [facility] Adult PT Treatment:                                                DATE: 02/11/23 Therapeutic Exercise: Developed, instructed in, and pt completed therex as noted in HEP  Self Care: Use of cold pack for 10 mins periodically for symptom management    PATIENT EDUCATION: Education details: Eval findings, POC, HEP, self care  Person educated: Patient Education method: Explanation, Demonstration, Tactile cues, Verbal cues, and Handouts Education comprehension: verbalized understanding, returned demonstration, verbal cues required, and tactile cues required  HOME EXERCISE PROGRAM: Access Code: [email or web address] URL: [email or web address] Date: 02/11/2024 Prepared by: Liborio Reeds  Exercises - Seated Shoulder Flexion Towel Slide at Table Top  - 3 x daily - 7 x  weekly - 1 sets - 10 reps - 3 hold - Seated Shoulder Abduction Towel Slide at Table Top (Mirrored)  - 3 x daily - 7 x weekly - 1 sets - 10 reps - 3 hold - Seated Shoulder External Rotation PROM on Table (Mirrored)  - 3 x daily - 7 x weekly - 1 sets - 10 reps - 3 hold  ASSESSMENT:  CLINICAL IMPRESSION: First f/u session.  Reviewed HEP and added additional AAROM.  Written HEP issued to patient.  PROM greatly improved since initial visit, not formally measured.  Incorporated manual Patient is a 63 y.o. female who was seen today for physical therapy evaluation and treatment for M75.01 (ICD-10-CM) - Adhesive capsulitis of right shoulder  M75.21 (ICD-10-CM) - Biceps tendinitis of right shoulder  s/p right shoulder manipulation under anesthesia with mini open bicep tenodesis  PROM  and AAROM but no full AROM or ROM behind the back and no strengthening/lifting exercises right shoulder. Post op 01/29/2024. Pt presents with R shoulder ROM limitations consistent with her surgical procedure. Pt was advised against lifting her R hand above chest level which she reports attempting to try in order to exercise her shoulder on her own. A HEP was initiated. Pt will benefit from skilled PT to address impairments to optimize R shoulder function with less pain.   OBJECTIVE IMPAIRMENTS: decreased ROM, decreased strength, impaired UE functional use, and pain.   ACTIVITY LIMITATIONS: carrying, lifting, bathing, toileting, dressing, reach over head, hygiene/grooming, and caring for others  PARTICIPATION LIMITATIONS: meal prep, cleaning, laundry, driving, shopping, and community activity  PERSONAL FACTORS: Past/current experiences, Time since onset of injury/illness/exacerbation, and 3+ comorbidities: DM, anxiety, depression  are also affecting patient's functional outcome.   REHAB POTENTIAL: Good  CLINICAL DECISION MAKING: Evolving/moderate complexity  EVALUATION COMPLEXITY: Moderate   GOALS:  SHORT TERM GOALS:  Target date: 02/27/24  Pt will be Ind in an initial HEP Baseline: started Goal status: INITIAL  2.  The R shoulder will demonstrate AAROM for flexion 110d, abd 100d, and ER of 50d for appropriate progression with rehab Baseline: see flow sheets Goal status: INITIAL  LONG TERM GOALS: Target date: 04/16/24  Pt will be Ind in a final HEP to maintain achieved LOF  Baseline:  Goal status: INITIAL  2.  Pt will demonstrate AROM for R shoulder to 120d, abd 100d, and ER 65d for appropriate function of the R UE Baseline: see flow sheets Goal status: INITIAL  3.  Pt will be able to lift a 3 # weight overhead for appropriate function of the R UE with daily activities Baseline:  Goal status: INITIAL  4.  Pt's Quick Dash score will improve to 40% or less as indication of improved function  Baseline: 71% Goal status: INITIAL  PLAN:  PT FREQUENCY: 2x/week  PT DURATION: 8 weeks  PLANNED INTERVENTIONS: 97164- PT Re-evaluation, 97110-Therapeutic exercises, 97530- Therapeutic activity, [id number]- Neuromuscular re-education, 97535- Self Care, 16109- Manual therapy, [id number]- Electrical stimulation (unattended), 97033- Ionotophoresis 4mg /ml Dexamethasone , Stair training, Taping, Dry Needling, Joint mobilization, Cryotherapy, and Moist heat  PLAN FOR NEXT SESSION: Assess response to HEP; progress therex as indicated; use of modalities, manual therapy; and TPDN as indicated.  Jeff Sakshi Sermons PT  02/13/24 1:44 PM

## 2024-02-16 NOTE — Therapy (Unsigned)
 OUTPATIENT PHYSICAL THERAPY TREATMENT NOTE   Patient Name: [name] MRN: [id number] DOB:1961/06/04, 63 y.o., female Today's Date: 02/17/2024  END OF SESSION:  PT End of Session - 02/17/24 1528     Visit Number 3    Number of Visits 17    Date for PT Re-Evaluation 04/16/24    Authorization Type [place] MEDICAID [employer]    Authorization - Number of Visits 27    PT Start Time 1530    PT Stop Time 1610    PT Time Calculation (min) 40 min    Activity Tolerance Patient tolerated treatment well    Behavior During Therapy WFL for tasks assessed/performed               Past Medical History:  Diagnosis Date   Anxiety    Arthritis    Depression    Diabetes mellitus without complication (HCC)    GERD (gastroesophageal reflux disease)    Hyperlipidemia    Hypertension    Hypothyroidism    Migraines    Thyroid  disease    Past Surgical History:  Procedure Laterality Date   ANTERIOR CERVICAL DECOMP/DISCECTOMY FUSION N/A 03/11/2023   Procedure: C4-5, C5-6, C6-7 ANTERIOR CERVICAL DECOMPRESSION/DISCECTOMY FUSION 3 LEVELS;  Surgeon: Diedra Fowler, MD;  Location: [facility] OR;  Service: Orthopedics;  Laterality: N/A;   BICEPS TENDON REPAIR Right 01/29/2024   Procedure: REPAIR, TENDON, BICEPS, PROXIMAL;  Surgeon: Jasmine Mesi, MD;  Location: [facility] OR;  Service: Orthopedics;  Laterality: Right;   BREAST CYST EXCISION Right 1980   BREAST CYST EXCISION Left 1985   EXAM UNDER ANESTHESIA WITH MANIPULATION OF SHOULDER Right 01/29/2024   Procedure: EXAM UNDER ANESTHESIA, SHOULDER, WITH MANIPULATION;  Surgeon: Jasmine Mesi, MD;  Location: [facility] OR;  Service: Orthopedics;  Laterality: Right;  RIGHT SHOULDER MUA with ARTHROSCOPC ROTATOR INTERVAL RELEASE AND POSSIBLE MINI OPEN BICEP TENODESIS   NO PAST SURGERIES     POSTERIOR LUMBAR FUSION 2 WITH HARDWARE REMOVAL Right 01/29/2024   Procedure: ARTHROSCOPY, SHOULDER WITH DEBRIDEMENT;  Surgeon: Jasmine Mesi, MD;   Location: [facility] OR;  Service: Orthopedics;  Laterality: Right;  RIGHT SHOULDER MUA with ARTHROSCOPC ROTATOR INTERVAL RELEASE AND POSSIBLE MINI OPEN BICEP TENODESIS   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   Brain atrophy (HCC) 12/18/2022   Complete tear of right rotator cuff 05/16/2022   Adhesive capsulitis of right shoulder 03/14/2022   Influenza vaccine needed 10/31/2020   Need for diphtheria-tetanus-pertussis (Tdap) vaccine 10/31/2020   Hyperlipidemia associated with type 2 diabetes mellitus (HCC) 10/31/2020   Overweight (BMI 25.0-29.9) 10/31/2020   Type 2 diabetes mellitus without complication, without long-term current use of insulin  (HCC) 07/31/2020   OSA (obstructive sleep apnea) 02/02/2020   Spondylosis without myelopathy or radiculopathy, lumbar region 09/17/2017   Essential hypertension 09/05/2017   Chronic bilateral low back pain with left-sided sciatica 09/05/2017   HTN (hypertension) 05/05/2017   Hypothyroidism 02/24/2017   Mixed hyperlipidemia 02/24/2017   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   Sleep related headaches 01/23/2017   Snoring 01/23/2017   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   Chronic headaches 10/30/2016   Obesity (BMI 30.0-34.9) 05/24/2016   PMB (postmenopausal bleeding) 05/24/2016   Back pain of lumbar region with sciatica 09/28/2015    PCP: Lawrance Presume, MD  REFERRING PROVIDER: Darol Elizabeth,  Justice Olp, PA-C  REFERRING DIAG:  M75.01 (ICD-10-CM) - Adhesive capsulitis of right shoulder  M75.21 (ICD-10-CM) - Biceps tendinitis of right shoulder    THERAPY DIAG:  Acute pain of right shoulder  Muscle weakness (generalized)  Stiffness of right shoulder, not elsewhere classified  Rationale for Evaluation and Treatment:  Rehabilitation  ONSET DATE: 01/29/24 DOS  SUBJECTIVE:                                                                                                                                                                                      SUBJECTIVE STATEMENT: Arrives to OPPT with 7/10 pain levels.  Symptoms onset 1-2 days following last PT session  Hand dominance: Right  PERTINENT HISTORY: DM, anxiety, depression  PAIN:  Are you having pain?Yes: NPRS scale: 2/10. Pain range the week prior to start of Pt: 2-10/10 Pain location: R shoulder Pain description: Throb, constant Aggravating factors: Moving her shoulder the wrong way, Increased pain with sleeping Relieving factors: Rest, support  PRECAUTIONS: PROM and AAROM but no full AROM or ROM behind the back and no strengthening/lifting exercises right shoulder Post op 01/29/2024  RED FLAGS: None   WEIGHT BEARING RESTRICTIONS: No  FALLS:  Has patient fallen in last 6 months? No  LIVING ENVIRONMENT: Lives with: lives with their family Lives in: House/apartment Able to access home  OCCUPATION: Retired. Likes to shop, go to movies, go out to eat  PLOF: Independent with basic ADLs  PATIENT GOALS:Good use of the R shoulder/arm  NEXT MD VISIT:   OBJECTIVE:  Note: Objective measures were completed at Evaluation unless otherwise noted.  PATIENT SURVEYS:  Quick Dash 39/55= 71%  COGNITION: Overall cognitive status: Within functional limits for tasks assessed     SENSATION: WFL  POSTURE: Forward head, round shoulder, and flexed trunk  UPPER EXTREMITY ROM:   Active ROM Right eval Left eval  Shoulder flexion AA 90 A 120   Shoulder extension    Shoulder abduction/scaption AA 80A A 115   Shoulder adduction    Shoulder internal rotation NT A T11  Shoulder external rotation AA 35 A T3  Elbow flexion    Elbow extension    Wrist flexion    Wrist extension    Wrist ulnar deviation    Wrist radial deviation    Wrist  pronation    Wrist supination    (Blank rows = not tested)  UPPER EXTREMITY MMT:  NT MMT Right eval Left eval  Shoulder flexion    Shoulder extension    Shoulder abduction    Shoulder adduction    Shoulder internal rotation    Shoulder external rotation  Middle trapezius    Lower trapezius    Elbow flexion    Elbow extension    Wrist flexion    Wrist extension    Wrist ulnar deviation    Wrist radial deviation    Wrist pronation    Wrist supination    Grip strength (lbs)    (Blank rows = not tested)  SHOULDER SPECIAL TESTS: NT  JOINT MOBILITY TESTING:  NT  PALPATION:  TTP throughout the R GH region                                                                                                                    TREATMENT DATE:  [facility] Adult PT Treatment:                                                DATE: 02/17/24 Therapeutic Exercise: OH pulley 5 min Supine elbow flexion (in neutral shoulder extension) 15 x2 Neuromuscular re-ed: 4 way scapula L side lie 15x ea R pec minor release 2 min Joint mobs INF/POST/DISTRACT 5x10 R shoulder PNF D1 F/E Therapeutic Activity: Supine flexion with Ranger 15x 2 Supine press with Ranger 15x 2 Standing abduction with Ranger 15 x2  [facility] Adult PT Treatment:                                                DATE: 02/13/24 Therapeutic Exercise: OH pulley flexion 5 min Neuromuscular re-ed: 4 way scapula L side lie 15x ea R pec monor release 2 min PROM Therapeutic Activity: Supine flexion with Ranger 15x Supine press with Ranger 15x  Standing abduction with Ranger  Seated table flexion 15x Seated table abduction 15x Seated ER 15x  [facility] Adult PT Treatment:                                                DATE: 02/11/23 Therapeutic Exercise: Developed, instructed in, and pt completed therex as noted in HEP  Self Care: Use of cold pack for 10 mins periodically for symptom management    PATIENT EDUCATION: Education details:  Eval findings, POC, HEP, self care  Person educated: Patient Education method: Explanation, Demonstration, Tactile cues, Verbal cues, and Handouts Education comprehension: verbalized understanding, returned demonstration, verbal cues required, and tactile cues required  HOME EXERCISE PROGRAM: Access Code: [email or web address] URL: [email or web address] Date: 02/11/2024 Prepared by: Liborio Reeds  Exercises - Seated Shoulder Flexion Towel Slide at Table Top  - 3 x daily - 7 x weekly - 1 sets - 10 reps - 3 hold - Seated Shoulder Abduction Towel Slide at Table Top (Mirrored)  - 3  x daily - 7 x weekly - 1 sets - 10 reps - 3 hold - Seated Shoulder External Rotation PROM on Table (Mirrored)  - 3 x daily - 7 x weekly - 1 sets - 10 reps - 3 hold  ASSESSMENT:  CLINICAL IMPRESSION: Arrives with elevated symptoms onsetting 2 days after last session.  Focus of today remained on ROM, increasing reps a noted and monitoring symptoms.  Incorporated elbow AROM and manual PNF patterns.  No loss of motion observed but apprehension and guarding noted. Patient is a 63 y.o. female who was seen today for physical therapy evaluation and treatment for M75.01 (ICD-10-CM) - Adhesive capsulitis of right shoulder  M75.21 (ICD-10-CM) - Biceps tendinitis of right shoulder  s/p right shoulder manipulation under anesthesia with mini open bicep tenodesis  PROM and AAROM but no full AROM or ROM behind the back and no strengthening/lifting exercises right shoulder. Post op 01/29/2024. Pt presents with R shoulder ROM limitations consistent with her surgical procedure. Pt was advised against lifting her R hand above chest level which she reports attempting to try in order to exercise her shoulder on her own. A HEP was initiated. Pt will benefit from skilled PT to address impairments to optimize R shoulder function with less pain.   OBJECTIVE IMPAIRMENTS: decreased ROM, decreased strength, impaired UE functional use, and pain.    ACTIVITY LIMITATIONS: carrying, lifting, bathing, toileting, dressing, reach over head, hygiene/grooming, and caring for others  PARTICIPATION LIMITATIONS: meal prep, cleaning, laundry, driving, shopping, and community activity  PERSONAL FACTORS: Past/current experiences, Time since onset of injury/illness/exacerbation, and 3+ comorbidities: DM, anxiety, depression  are also affecting patient's functional outcome.   REHAB POTENTIAL: Good  CLINICAL DECISION MAKING: Evolving/moderate complexity  EVALUATION COMPLEXITY: Moderate   GOALS:  SHORT TERM GOALS: Target date: 02/27/24  Pt will be Ind in an initial HEP Baseline: started Goal status: INITIAL  2.  The R shoulder will demonstrate AAROM for flexion 110d, abd 100d, and ER of 50d for appropriate progression with rehab Baseline: see flow sheets Goal status: INITIAL  LONG TERM GOALS: Target date: 04/16/24  Pt will be Ind in a final HEP to maintain achieved LOF  Baseline:  Goal status: INITIAL  2.  Pt will demonstrate AROM for R shoulder to 120d, abd 100d, and ER 65d for appropriate function of the R UE Baseline: see flow sheets Goal status: INITIAL  3.  Pt will be able to lift a 3 # weight overhead for appropriate function of the R UE with daily activities Baseline:  Goal status: INITIAL  4.  Pt's Quick Dash score will improve to 40% or less as indication of improved function  Baseline: 71% Goal status: INITIAL  PLAN:  PT FREQUENCY: 2x/week  PT DURATION: 8 weeks  PLANNED INTERVENTIONS: 97164- PT Re-evaluation, 97110-Therapeutic exercises, 97530- Therapeutic activity, [id number]- Neuromuscular re-education, 97535- Self Care, 16109- Manual therapy, G0283- Electrical stimulation (unattended), 97033- Ionotophoresis 4mg /ml Dexamethasone , Stair training, Taping, Dry Needling, Joint mobilization, Cryotherapy, and Moist heat  PLAN FOR NEXT SESSION: Assess response to HEP; progress therex as indicated; use of modalities, manual  therapy; and TPDN as indicated.  Jeff Leylany Nored PT  02/17/24 4:13 PM

## 2024-02-17 ENCOUNTER — Ambulatory Visit

## 2024-02-17 ENCOUNTER — Other Ambulatory Visit: Payer: Self-pay | Admitting: Internal Medicine

## 2024-02-17 DIAGNOSIS — M25611 Stiffness of right shoulder, not elsewhere classified: Secondary | ICD-10-CM

## 2024-02-17 DIAGNOSIS — M25511 Pain in right shoulder: Secondary | ICD-10-CM | POA: Diagnosis not present

## 2024-02-17 DIAGNOSIS — E1169 Type 2 diabetes mellitus with other specified complication: Secondary | ICD-10-CM

## 2024-02-17 DIAGNOSIS — M6281 Muscle weakness (generalized): Secondary | ICD-10-CM

## 2024-02-19 ENCOUNTER — Ambulatory Visit: Admitting: Physical Therapy

## 2024-02-24 ENCOUNTER — Ambulatory Visit: Attending: Surgical | Admitting: Physical Therapy

## 2024-02-24 ENCOUNTER — Encounter: Payer: Self-pay | Admitting: Physical Therapy

## 2024-02-24 DIAGNOSIS — M25611 Stiffness of right shoulder, not elsewhere classified: Secondary | ICD-10-CM | POA: Diagnosis present

## 2024-02-24 DIAGNOSIS — M25511 Pain in right shoulder: Secondary | ICD-10-CM | POA: Diagnosis present

## 2024-02-24 DIAGNOSIS — M6281 Muscle weakness (generalized): Secondary | ICD-10-CM | POA: Insufficient documentation

## 2024-02-24 NOTE — Therapy (Signed)
 OUTPATIENT PHYSICAL THERAPY TREATMENT NOTE   Patient Name: [name] MRN: [id number] DOB:08/18/1961, 63 y.o., female Today's Date: 02/24/2024  END OF SESSION:  PT End of Session - 02/24/24 1100     Visit Number 4    Number of Visits 17    Date for PT Re-Evaluation 04/16/24    Authorization Type [place] MEDICAID [employer]    Authorization - Number of Visits 27    PT Start Time 1100    PT Stop Time 1148    PT Time Calculation (min) 48 min               Past Medical History:  Diagnosis Date   Anxiety    Arthritis    Depression    Diabetes mellitus without complication (HCC)    GERD (gastroesophageal reflux disease)    Hyperlipidemia    Hypertension    Hypothyroidism    Migraines    Thyroid  disease    Past Surgical History:  Procedure Laterality Date   ANTERIOR CERVICAL DECOMP/DISCECTOMY FUSION N/A 03/11/2023   Procedure: C4-5, C5-6, C6-7 ANTERIOR CERVICAL DECOMPRESSION/DISCECTOMY FUSION 3 LEVELS;  Surgeon: Diedra Fowler, MD;  Location: [facility] OR;  Service: Orthopedics;  Laterality: N/A;   BICEPS TENDON REPAIR Right 01/29/2024   Procedure: REPAIR, TENDON, BICEPS, PROXIMAL;  Surgeon: Jasmine Mesi, MD;  Location: [facility] OR;  Service: Orthopedics;  Laterality: Right;   BREAST CYST EXCISION Right 1980   BREAST CYST EXCISION Left 1985   EXAM UNDER ANESTHESIA WITH MANIPULATION OF SHOULDER Right 01/29/2024   Procedure: EXAM UNDER ANESTHESIA, SHOULDER, WITH MANIPULATION;  Surgeon: Jasmine Mesi, MD;  Location: [facility] OR;  Service: Orthopedics;  Laterality: Right;  RIGHT SHOULDER MUA with ARTHROSCOPC ROTATOR INTERVAL RELEASE AND POSSIBLE MINI OPEN BICEP TENODESIS   NO PAST SURGERIES     POSTERIOR LUMBAR FUSION 2 WITH HARDWARE REMOVAL Right 01/29/2024   Procedure: ARTHROSCOPY, SHOULDER WITH DEBRIDEMENT;  Surgeon: Jasmine Mesi, MD;  Location: [facility] OR;  Service: Orthopedics;  Laterality: Right;  RIGHT SHOULDER MUA with ARTHROSCOPC ROTATOR INTERVAL RELEASE AND  POSSIBLE MINI OPEN BICEP TENODESIS   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   Brain atrophy (HCC) 12/18/2022   Complete tear of right rotator cuff 05/16/2022   Adhesive capsulitis of right shoulder 03/14/2022   Influenza vaccine needed 10/31/2020   Need for diphtheria-tetanus-pertussis (Tdap) vaccine 10/31/2020   Hyperlipidemia associated with type 2 diabetes mellitus (HCC) 10/31/2020   Overweight (BMI 25.0-29.9) 10/31/2020   Type 2 diabetes mellitus without complication, without long-term current use of insulin  (HCC) 07/31/2020   OSA (obstructive sleep apnea) 02/02/2020   Spondylosis without myelopathy or radiculopathy, lumbar region 09/17/2017   Essential hypertension 09/05/2017   Chronic bilateral low back pain with left-sided sciatica 09/05/2017   HTN (hypertension) 05/05/2017   Hypothyroidism 02/24/2017   Mixed hyperlipidemia 02/24/2017   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   Sleep related headaches 01/23/2017   Snoring 01/23/2017   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   Chronic headaches 10/30/2016   Obesity (BMI 30.0-34.9) 05/24/2016   PMB (postmenopausal bleeding) 05/24/2016   Back pain of lumbar region with sciatica 09/28/2015    PCP: Lawrance Presume, MD  REFERRING PROVIDER: Casilda Clayman, PA-C  REFERRING DIAG:  M75.01 (ICD-10-CM) - Adhesive capsulitis of right shoulder  M75.21 (ICD-10-CM) -  Biceps tendinitis of right shoulder    THERAPY DIAG:  Acute pain of right shoulder  Muscle weakness (generalized)  Rationale for Evaluation and Treatment: Rehabilitation  ONSET DATE: 01/29/24 DOS  SUBJECTIVE:                                                                                                                                                                                       SUBJECTIVE STATEMENT: No pain on arrival. Reports that she gets more and more sore every time she exercises. Also reports that she was using the pain scale backwards.     Hand dominance: Right  PERTINENT HISTORY: DM, anxiety, depression  PAIN:  Are you having pain?Yes: NPRS scale: 0/10. Pain range the week prior to start of Pt: 2-10/10 Pain location: R shoulder Pain description: Throb, constant Aggravating factors: Moving her shoulder the wrong way, Increased pain with sleeping Relieving factors: Rest, support  PRECAUTIONS: PROM and AAROM but no full AROM or ROM behind the back and no strengthening/lifting exercises right shoulder Post op 01/29/2024  RED FLAGS: None   WEIGHT BEARING RESTRICTIONS: No  FALLS:  Has patient fallen in last 6 months? No  LIVING ENVIRONMENT: Lives with: lives with their family Lives in: House/apartment Able to access home  OCCUPATION: Retired. Likes to shop, go to movies, go out to eat  PLOF: Independent with basic ADLs  PATIENT GOALS:Good use of the R shoulder/arm  NEXT MD VISIT:   OBJECTIVE:  Note: Objective measures were completed at Evaluation unless otherwise noted.  PATIENT SURVEYS:  Quick Dash 39/55= 71%  COGNITION: Overall cognitive status: Within functional limits for tasks assessed     SENSATION: WFL  POSTURE: Forward head, round shoulder, and flexed trunk  UPPER EXTREMITY ROM:   Active ROM Right eval Left eval Right 02/24/24  Shoulder flexion AA 90 A 120  Supine AA135  Shoulder extension     Shoulder abduction/scaption AA 80A A 115  PROM 95  Shoulder adduction     Shoulder internal rotation NT A T11   Shoulder external rotation AA 35 A T3 PROM 40  Elbow flexion     Elbow extension     Wrist flexion     Wrist extension     Wrist ulnar deviation     Wrist radial deviation     Wrist pronation     Wrist supination     (Blank rows = not tested)  UPPER EXTREMITY  MMT:  NT MMT Right eval Left eval  Shoulder flexion    Shoulder extension    Shoulder abduction    Shoulder adduction    Shoulder internal rotation    Shoulder  external rotation    Middle trapezius    Lower trapezius    Elbow flexion    Elbow extension    Wrist flexion    Wrist extension    Wrist ulnar deviation    Wrist radial deviation    Wrist pronation    Wrist supination    Grip strength (lbs)    (Blank rows = not tested)  SHOULDER SPECIAL TESTS: NT  JOINT MOBILITY TESTING:  NT  PALPATION:  TTP throughout the R GH region                                                                                                                    TREATMENT DATE:  [facility] Adult PT Treatment:                                                DATE: 02/24/24 Therapeutic Exercise: PROM right shoulder flexion, abdct , ER  Manual Therapy: Joint mobs INF/POST/DISTRACT 5x10 STW right upper arm  Therapeutic Activity: Supine flexion with dowel 15 x  Supine press with dowel 15x  Supine ER AAROM with Dowel 15x Seated table flexion 15x- using foam roller Seated table abduction 15x- using foam roller  Modalities: Ice pack right shoulder x 8 minutes       [facility] Adult PT Treatment:                                                DATE: 02/17/24 Therapeutic Exercise: OH pulley 5 min Supine elbow flexion (in neutral shoulder extension) 15 x2 Neuromuscular re-ed: 4 way scapula L side lie 15x ea R pec minor release 2 min Joint mobs INF/POST/DISTRACT 5x10 R shoulder PNF D1 F/E Therapeutic Activity: Supine flexion with Ranger 15x 2 Supine press with Ranger 15x 2 Standing abduction with Ranger 15 x2  [facility] Adult PT Treatment:                                                DATE: 02/13/24 Therapeutic Exercise: OH pulley flexion 5 min Neuromuscular re-ed: 4 way scapula L side lie 15x ea R pec monor release 2 min PROM Therapeutic Activity: Supine flexion with Ranger 15x Supine press with  Ranger 15x  Standing abduction with Ranger  Seated table flexion 15x Seated table abduction 15x Seated ER 15x  [facility] Adult PT Treatment:  DATE: 02/11/23 Therapeutic Exercise: Developed, instructed in, and pt completed therex as noted in HEP  Self Care: Use of cold pack for 10 mins periodically for symptom management    PATIENT EDUCATION: Education details: Eval findings, POC, HEP, self care  Person educated: Patient Education method: Explanation, Demonstration, Tactile cues, Verbal cues, and Handouts Education comprehension: verbalized understanding, returned demonstration, verbal cues required, and tactile cues required  HOME EXERCISE PROGRAM: Access Code: [email or web address] URL: [email or web address] Date: 02/11/2024 Prepared by: Liborio Reeds  Exercises - Seated Shoulder Flexion Towel Slide at Table Top  - 3 x daily - 7 x weekly - 1 sets - 10 reps - 3 hold - Seated Shoulder Abduction Towel Slide at Table Top (Mirrored)  - 3 x daily - 7 x weekly - 1 sets - 10 reps - 3 hold - Seated Shoulder External Rotation PROM on Table (Mirrored)  - 3 x daily - 7 x weekly - 1 sets - 10 reps - 3 hold  ASSESSMENT:  CLINICAL IMPRESSION: Pt reports 0/10 pain. After discussing pain scale, realized pt has inverted the pain scale previously (pain last session 7/10, actually 3/10). She reports increased soreness after PT sessions and after HEP at home. Discussed restrictions from referral and reminded pt that her motions should be assisted or gravity reduced to avoid re injury. Pt verbalized understanding although unsure if pt is compliant with restrictions. Continued with joint mobs, PROM and AAROM as tolerated. STW performed to upper right arm to reduce pain and tension. Ice pack applied to right shoulder end of session to reduce soreness and educate pt on use of ice at home after her HEP.   Patient is a 63 y.o. female who was seen today for physical  therapy evaluation and treatment for M75.01 (ICD-10-CM) - Adhesive capsulitis of right shoulder  M75.21 (ICD-10-CM) - Biceps tendinitis of right shoulder  s/p right shoulder manipulation under anesthesia with mini open bicep tenodesis  PROM and AAROM but no full AROM or ROM behind the back and no strengthening/lifting exercises right shoulder. Post op 01/29/2024. Pt presents with R shoulder ROM limitations consistent with her surgical procedure. Pt was advised against lifting her R hand above chest level which she reports attempting to try in order to exercise her shoulder on her own. A HEP was initiated. Pt will benefit from skilled PT to address impairments to optimize R shoulder function with less pain.   OBJECTIVE IMPAIRMENTS: decreased ROM, decreased strength, impaired UE functional use, and pain.   ACTIVITY LIMITATIONS: carrying, lifting, bathing, toileting, dressing, reach over head, hygiene/grooming, and caring for others  PARTICIPATION LIMITATIONS: meal prep, cleaning, laundry, driving, shopping, and community activity  PERSONAL FACTORS: Past/current experiences, Time since onset of injury/illness/exacerbation, and 3+ comorbidities: DM, anxiety, depression  are also affecting patient's functional outcome.   REHAB POTENTIAL: Good  CLINICAL DECISION MAKING: Evolving/moderate complexity  EVALUATION COMPLEXITY: Moderate   GOALS:  SHORT TERM GOALS: Target date: 02/27/24  Pt will be Ind in an initial HEP Baseline: started Goal status: INITIAL  2.  The R shoulder will demonstrate AAROM for flexion 110d, abd 100d, and ER of 50d for appropriate progression with rehab Baseline: see flow sheets Goal status: INITIAL  LONG TERM GOALS: Target date: 04/16/24  Pt will be Ind in a final HEP to maintain achieved LOF  Baseline:  Goal status: INITIAL  2.  Pt will demonstrate AROM for R shoulder to 120d, abd 100d, and ER 65d for appropriate function of the R UE Baseline: see flow  sheets Goal  status: INITIAL  3.  Pt will be able to lift a 3 # weight overhead for appropriate function of the R UE with daily activities Baseline:  Goal status: INITIAL  4.  Pt's Quick Dash score will improve to 40% or less as indication of improved function  Baseline: 71% Goal status: INITIAL  PLAN:  PT FREQUENCY: 2x/week  PT DURATION: 8 weeks  PLANNED INTERVENTIONS: 97164- PT Re-evaluation, 97110-Therapeutic exercises, 97530- Therapeutic activity, [id number]- Neuromuscular re-education, 97535- Self Care, 16109- Manual therapy, G0283- Electrical stimulation (unattended), 97033- Ionotophoresis 4mg /ml Dexamethasone , Stair training, Taping, Dry Needling, Joint mobilization, Cryotherapy, and Moist heat  PLAN FOR NEXT SESSION: Assess response to HEP; progress therex as indicated; use of modalities, manual therapy; and TPDN as indicated.  Gasper Karst, PTA 02/24/24 12:55 PM Phone: [phone] Fax: [phone]

## 2024-02-26 ENCOUNTER — Encounter: Payer: Self-pay | Admitting: Physical Therapy

## 2024-02-26 ENCOUNTER — Ambulatory Visit: Admitting: Physical Therapy

## 2024-02-26 DIAGNOSIS — M6281 Muscle weakness (generalized): Secondary | ICD-10-CM

## 2024-02-26 DIAGNOSIS — M25511 Pain in right shoulder: Secondary | ICD-10-CM | POA: Diagnosis not present

## 2024-02-26 NOTE — Therapy (Signed)
 OUTPATIENT PHYSICAL THERAPY TREATMENT NOTE   Patient Name: [name] MRN: [id number] DOB:01/24/1961, 63 y.o., female Today's Date: 02/26/2024  END OF SESSION:  PT End of Session - 02/26/24 1322     Visit Number 5    Number of Visits 17    Authorization Type [place] MEDICAID [employer]    Authorization - Number of Visits 27    PT Start Time 1322    PT Stop Time 1410    PT Time Calculation (min) 48 min               Past Medical History:  Diagnosis Date   Anxiety    Arthritis    Depression    Diabetes mellitus without complication (HCC)    GERD (gastroesophageal reflux disease)    Hyperlipidemia    Hypertension    Hypothyroidism    Migraines    Thyroid  disease    Past Surgical History:  Procedure Laterality Date   ANTERIOR CERVICAL DECOMP/DISCECTOMY FUSION N/A 03/11/2023   Procedure: C4-5, C5-6, C6-7 ANTERIOR CERVICAL DECOMPRESSION/DISCECTOMY FUSION 3 LEVELS;  Surgeon: Diedra Fowler, MD;  Location: [facility] OR;  Service: Orthopedics;  Laterality: N/A;   BICEPS TENDON REPAIR Right 01/29/2024   Procedure: REPAIR, TENDON, BICEPS, PROXIMAL;  Surgeon: Jasmine Mesi, MD;  Location: [facility] OR;  Service: Orthopedics;  Laterality: Right;   BREAST CYST EXCISION Right 1980   BREAST CYST EXCISION Left 1985   EXAM UNDER ANESTHESIA WITH MANIPULATION OF SHOULDER Right 01/29/2024   Procedure: EXAM UNDER ANESTHESIA, SHOULDER, WITH MANIPULATION;  Surgeon: Jasmine Mesi, MD;  Location: [facility] OR;  Service: Orthopedics;  Laterality: Right;  RIGHT SHOULDER MUA with ARTHROSCOPC ROTATOR INTERVAL RELEASE AND POSSIBLE MINI OPEN BICEP TENODESIS   NO PAST SURGERIES     POSTERIOR LUMBAR FUSION 2 WITH HARDWARE REMOVAL Right 01/29/2024   Procedure: ARTHROSCOPY, SHOULDER WITH DEBRIDEMENT;  Surgeon: Jasmine Mesi, MD;  Location: [facility] OR;  Service: Orthopedics;  Laterality: Right;  RIGHT SHOULDER MUA with ARTHROSCOPC ROTATOR INTERVAL RELEASE AND POSSIBLE MINI OPEN BICEP TENODESIS    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   Brain atrophy (HCC) 12/18/2022   Complete tear of right rotator cuff 05/16/2022   Adhesive capsulitis of right shoulder 03/14/2022   Influenza vaccine needed 10/31/2020   Need for diphtheria-tetanus-pertussis (Tdap) vaccine 10/31/2020   Hyperlipidemia associated with type 2 diabetes mellitus (HCC) 10/31/2020   Overweight (BMI 25.0-29.9) 10/31/2020   Type 2 diabetes mellitus without complication, without long-term current use of insulin  (HCC) 07/31/2020   OSA (obstructive sleep apnea) 02/02/2020   Spondylosis without myelopathy or radiculopathy, lumbar region 09/17/2017   Essential hypertension 09/05/2017   Chronic bilateral low back pain with left-sided sciatica 09/05/2017   HTN (hypertension) 05/05/2017   Hypothyroidism 02/24/2017   Mixed hyperlipidemia 02/24/2017   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   Sleep related headaches 01/23/2017   Snoring 01/23/2017   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   Chronic headaches 10/30/2016   Obesity (BMI 30.0-34.9) 05/24/2016   PMB (postmenopausal bleeding) 05/24/2016   Back pain of lumbar region with sciatica 09/28/2015    PCP: Lawrance Presume, MD  REFERRING PROVIDER: Casilda Clayman, PA-C  REFERRING DIAG:  M75.01 (ICD-10-CM) - Adhesive capsulitis of right shoulder  M75.21 (ICD-10-CM) - Biceps tendinitis of right shoulder  THERAPY DIAG:  Acute pain of right shoulder  Muscle weakness (generalized)  Rationale for Evaluation and Treatment: Rehabilitation  ONSET DATE: 01/29/24 DOS  SUBJECTIVE:                                                                                                                                                                                       SUBJECTIVE STATEMENT: My shoulder hurts at night. I sleep on the surgical side because I have always slept on that side. I also have to use right handle to pull myself up into transportation bus.    Hand dominance: Right  PERTINENT HISTORY: DM, anxiety, depression  PAIN:  Are you having pain?Yes: NPRS scale: 4/10. Pain range the week prior to start of Pt: 2-10/10 Pain location: R shoulder Pain description: Throb, constant Aggravating factors: Moving her shoulder the wrong way, Increased pain with sleeping Relieving factors: Rest, support  PRECAUTIONS: PROM and AAROM but no full AROM or ROM behind the back and no strengthening/lifting exercises right shoulder Post op 01/29/2024  RED FLAGS: None   WEIGHT BEARING RESTRICTIONS: No  FALLS:  Has patient fallen in last 6 months? No  LIVING ENVIRONMENT: Lives with: lives with their family Lives in: House/apartment Able to access home  OCCUPATION: Retired. Likes to shop, go to movies, go out to eat  PLOF: Independent with basic ADLs  PATIENT GOALS:Good use of the R shoulder/arm  NEXT MD VISIT:   OBJECTIVE:  Note: Objective measures were completed at Evaluation unless otherwise noted.  PATIENT SURVEYS:  Quick Dash 39/55=71%  COGNITION: Overall cognitive status: Within functional limits for tasks assessed     SENSATION: WFL  POSTURE: Forward head, round shoulder, and flexed trunk  UPPER EXTREMITY ROM:   Active ROM Right eval Left eval Right 02/24/24  Shoulder flexion AA 90 A 120  Supine AA135  Shoulder extension     Shoulder abduction/scaption AA 80A A 115  PROM 95  Shoulder adduction     Shoulder internal rotation NT A T11   Shoulder external rotation AA 35 A T3 PROM 40  Elbow flexion     Elbow extension     Wrist flexion     Wrist extension     Wrist ulnar deviation     Wrist radial deviation     Wrist pronation     Wrist supination     (Blank rows = not tested)  UPPER EXTREMITY MMT:  NT MMT  Right eval Left eval  Shoulder flexion    Shoulder extension    Shoulder abduction    Shoulder adduction    Shoulder internal rotation    Shoulder external rotation  Middle trapezius    Lower trapezius    Elbow flexion    Elbow extension    Wrist flexion    Wrist extension    Wrist ulnar deviation    Wrist radial deviation    Wrist pronation    Wrist supination    Grip strength (lbs)    (Blank rows = not tested)  SHOULDER SPECIAL TESTS: NT  JOINT MOBILITY TESTING:  NT  PALPATION:  TTP throughout the R GH region                                                                                                                    TREATMENT DATE:  [facility] Adult PT Treatment:                                                DATE: 02/26/24 Therapeutic Exercise: PROM flexion, abdct, ER Manual Therapy: Joint mobs INF/POST/DISTRACT 5x10  Therapeutic Activity: Shoulder retraction/shoulder rolls  Supine chest press Supine pullovers Seated table slides with foam roller flexion Seated table slides with foam roller abduction Shoulder pulleys  flexion Upper trap stretch  Modalities: Ice pack right shoulder x 8 minutes       [facility] Adult PT Treatment:                                                DATE: 02/24/24 Therapeutic Exercise: PROM right shoulder flexion, abdct , ER  Manual Therapy: Joint mobs INF/POST/DISTRACT 5x10 STW right upper arm  Therapeutic Activity: Supine flexion with dowel 15 x  Supine press with dowel 15x  Supine ER AAROM with Dowel 15x Seated table flexion 15x- using foam roller Seated table abduction 15x- using foam roller  Modalities: Ice pack right shoulder x 8 minutes       [facility] Adult PT Treatment:                                                DATE: 02/17/24 Therapeutic Exercise: OH pulley 5 min Supine elbow flexion (in neutral shoulder extension) 15 x2 Neuromuscular re-ed: 4 way scapula L side lie 15x ea R pec minor release 2 min Joint  mobs INF/POST/DISTRACT 5x10 R shoulder PNF D1 F/E Therapeutic Activity: Supine flexion with Ranger 15x 2 Supine press with Ranger 15x 2 Standing abduction with Ranger 15 x2  [facility] Adult PT Treatment:  DATE: 02/13/24 Therapeutic Exercise: OH pulley flexion 5 min Neuromuscular re-ed: 4 way scapula L side lie 15x ea R pec monor release 2 min PROM Therapeutic Activity: Supine flexion with Ranger 15x Supine press with Ranger 15x  Standing abduction with Ranger  Seated table flexion 15x Seated table abduction 15x Seated ER 15x     PATIENT EDUCATION: Education details: Eval findings, POC, HEP, self care  Person educated: Patient Education method: Explanation, Demonstration, Tactile cues, Verbal cues, and Handouts Education comprehension: verbalized understanding, returned demonstration, verbal cues required, and tactile cues required  HOME EXERCISE PROGRAM: Access Code: [email or web address] URL: [email or web address] Date: 02/11/2024 Prepared by: Liborio Reeds  Exercises - Seated Shoulder Flexion Towel Slide at Table Top  - 3 x daily - 7 x weekly - 1 sets - 10 reps - 3 hold - Seated Shoulder Abduction Towel Slide at Table Top (Mirrored)  - 3 x daily - 7 x weekly - 1 sets - 10 reps - 3 hold - Seated Shoulder External Rotation PROM on Table (Mirrored)  - 3 x daily - 7 x weekly - 1 sets - 10 reps - 3 hold  ASSESSMENT:  CLINICAL IMPRESSION: Pt reports 4/10 pain and continues to abdct and ER arm to show what motion hurts. Again reminded patient to avoid reaching up or out until MD follow up. She also admits to reaching and pulling herself up  steps into transportation bus using the surgical arm.  She continues to report pain with sleeping and admits that she sleeps on her surgical side which increases her pain. Added in scap setting and shoulder rolls today as well as upper trap stretch due to pt arriving with her right shoulder elevated and  protracted.  Continued with joint mobs, PROM and AAROM as tolerated. Ice pack applied to right shoulder end of session to reduce soreness at end of session.   Patient is a 63 y.o. female who was seen today for physical therapy evaluation and treatment for M75.01 (ICD-10-CM) - Adhesive capsulitis of right shoulder  M75.21 (ICD-10-CM) - Biceps tendinitis of right shoulder  s/p right shoulder manipulation under anesthesia with mini open bicep tenodesis  PROM and AAROM but no full AROM or ROM behind the back and no strengthening/lifting exercises right shoulder. Post op 01/29/2024. Pt presents with R shoulder ROM limitations consistent with her surgical procedure. Pt was advised against lifting her R hand above chest level which she reports attempting to try in order to exercise her shoulder on her own. A HEP was initiated. Pt will benefit from skilled PT to address impairments to optimize R shoulder function with less pain.   OBJECTIVE IMPAIRMENTS: decreased ROM, decreased strength, impaired UE functional use, and pain.   ACTIVITY LIMITATIONS: carrying, lifting, bathing, toileting, dressing, reach over head, hygiene/grooming, and caring for others  PARTICIPATION LIMITATIONS: meal prep, cleaning, laundry, driving, shopping, and community activity  PERSONAL FACTORS: Past/current experiences, Time since onset of injury/illness/exacerbation, and 3+ comorbidities: DM, anxiety, depression are also affecting patient's functional outcome.   REHAB POTENTIAL: Good  CLINICAL DECISION MAKING: Evolving/moderate complexity  EVALUATION COMPLEXITY: Moderate   GOALS:  SHORT TERM GOALS: Target date: 02/27/24  Pt will be Ind in an initial HEP Baseline: started Goal status: MET  2.  The R shoulder will demonstrate AAROM for flexion 110d, abd 100d, and ER of 50d for appropriate progression with rehab Baseline: see flow sheets Goal status: PARTIALLY MET  LONG TERM GOALS: Target date: 04/16/24  Pt will be Ind  in a final  HEP to maintain achieved LOF  Baseline:  Goal status: INITIAL  2.  Pt will demonstrate AROM for R shoulder to 120d, abd 100d, and ER 65d for appropriate function of the R UE Baseline: see flow sheets Goal status: INITIAL  3.  Pt will be able to lift a 3 # weight overhead for appropriate function of the R UE with daily activities Baseline:  Goal status: INITIAL  4.  Pt's Quick Dash score will improve to 40% or less as indication of improved function  Baseline: 71% Goal status: INITIAL  PLAN:  PT FREQUENCY: 2x/week  PT DURATION: 8 weeks  PLANNED INTERVENTIONS: 97164- PT Re-evaluation, 97110-Therapeutic exercises, 97530- Therapeutic activity, [id number]- Neuromuscular re-education, 97535- Self Care, 16109- Manual therapy, [id number]- Electrical stimulation (unattended), 97033- Ionotophoresis 4mg /ml Dexamethasone , Stair training, Taping, Dry Needling, Joint mobilization, Cryotherapy, and Moist heat  PLAN FOR NEXT SESSION: Assess response to HEP; progress therex as indicated; use of modalities, manual therapy; and TPDN as indicated.  Gasper Karst, PTA 02/26/24 2:04 PM Phone: [phone] Fax: [phone]

## 2024-03-01 NOTE — Therapy (Deleted)
 OUTPATIENT PHYSICAL THERAPY TREATMENT NOTE   Patient Name: [name] MRN: [id number] DOB:07/03/1961, 63 y.o., female Today's Date: 03/01/2024  END OF SESSION:      Past Medical History:  Diagnosis Date   Anxiety    Arthritis    Depression    Diabetes mellitus without complication (HCC)    GERD (gastroesophageal reflux disease)    Hyperlipidemia    Hypertension    Hypothyroidism    Migraines    Thyroid  disease    Past Surgical History:  Procedure Laterality Date   ANTERIOR CERVICAL DECOMP/DISCECTOMY FUSION N/A 03/11/2023   Procedure: C4-5, C5-6, C6-7 ANTERIOR CERVICAL DECOMPRESSION/DISCECTOMY FUSION 3 LEVELS;  Surgeon: Diedra Fowler, MD;  Location: [facility] OR;  Service: Orthopedics;  Laterality: N/A;   BICEPS TENDON REPAIR Right 01/29/2024   Procedure: REPAIR, TENDON, BICEPS, PROXIMAL;  Surgeon: Jasmine Mesi, MD;  Location: [facility] OR;  Service: Orthopedics;  Laterality: Right;   BREAST CYST EXCISION Right 1980   BREAST CYST EXCISION Left 1985   EXAM UNDER ANESTHESIA WITH MANIPULATION OF SHOULDER Right 01/29/2024   Procedure: EXAM UNDER ANESTHESIA, SHOULDER, WITH MANIPULATION;  Surgeon: Jasmine Mesi, MD;  Location: [facility] OR;  Service: Orthopedics;  Laterality: Right;  RIGHT SHOULDER MUA with ARTHROSCOPC ROTATOR INTERVAL RELEASE AND POSSIBLE MINI OPEN BICEP TENODESIS   NO PAST SURGERIES     POSTERIOR LUMBAR FUSION 2 WITH HARDWARE REMOVAL Right 01/29/2024   Procedure: ARTHROSCOPY, SHOULDER WITH DEBRIDEMENT;  Surgeon: Jasmine Mesi, MD;  Location: [facility] OR;  Service: Orthopedics;  Laterality: Right;  RIGHT SHOULDER MUA with ARTHROSCOPC ROTATOR INTERVAL RELEASE AND POSSIBLE MINI OPEN BICEP TENODESIS   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   Brain  atrophy (HCC) 12/18/2022   Complete tear of right rotator cuff 05/16/2022   Adhesive capsulitis of right shoulder 03/14/2022   Influenza vaccine needed 10/31/2020   Need for diphtheria-tetanus-pertussis (Tdap) vaccine 10/31/2020   Hyperlipidemia associated with type 2 diabetes mellitus (HCC) 10/31/2020   Overweight (BMI 25.0-29.9) 10/31/2020   Type 2 diabetes mellitus without complication, without long-term current use of insulin  (HCC) 07/31/2020   OSA (obstructive sleep apnea) 02/02/2020   Spondylosis without myelopathy or radiculopathy, lumbar region 09/17/2017   Essential hypertension 09/05/2017   Chronic bilateral low back pain with left-sided sciatica 09/05/2017   HTN (hypertension) 05/05/2017   Hypothyroidism 02/24/2017   Mixed hyperlipidemia 02/24/2017   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   Sleep related headaches 01/23/2017   Snoring 01/23/2017   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   Chronic headaches 10/30/2016   Obesity (BMI 30.0-34.9) 05/24/2016   PMB (postmenopausal bleeding) 05/24/2016   Back pain of lumbar region with sciatica 09/28/2015    PCP: Lawrance Presume, MD  REFERRING PROVIDER: Casilda Clayman, PA-C  REFERRING DIAG:  M75.01 (ICD-10-CM) - Adhesive capsulitis of right shoulder  M75.21 (ICD-10-CM) - Biceps tendinitis of right shoulder    THERAPY DIAG:  No diagnosis found.  Rationale for Evaluation and Treatment: Rehabilitation  ONSET DATE: 01/29/24 DOS  SUBJECTIVE:  SUBJECTIVE STATEMENT: My shoulder hurts at night. I sleep on the surgical side because I have always slept on that side. I also have to use right handle to pull myself up into transportation bus.    Hand dominance: Right  PERTINENT HISTORY: DM, anxiety, depression  PAIN:  Are you having pain?Yes:  NPRS scale: 4/10. Pain range the week prior to start of Pt: 2-10/10 Pain location: R shoulder Pain description: Throb, constant Aggravating factors: Moving her shoulder the wrong way, Increased pain with sleeping Relieving factors: Rest, support  PRECAUTIONS: PROM and AAROM but no full AROM or ROM behind the back and no strengthening/lifting exercises right shoulder Post op 01/29/2024  RED FLAGS: None   WEIGHT BEARING RESTRICTIONS: No  FALLS:  Has patient fallen in last 6 months? No  LIVING ENVIRONMENT: Lives with: lives with their family Lives in: House/apartment Able to access home  OCCUPATION: Retired. Likes to shop, go to movies, go out to eat  PLOF: Independent with basic ADLs  PATIENT GOALS:Good use of the R shoulder/arm  NEXT MD VISIT:   OBJECTIVE:  Note: Objective measures were completed at Evaluation unless otherwise noted.  PATIENT SURVEYS:  Quick Dash 39/55=71%  COGNITION: Overall cognitive status: Within functional limits for tasks assessed     SENSATION: WFL  POSTURE: Forward head, round shoulder, and flexed trunk  UPPER EXTREMITY ROM:   Active ROM Right eval Left eval Right 02/24/24  Shoulder flexion AA 90 A 120  Supine AA135  Shoulder extension     Shoulder abduction/scaption AA 80A A 115  PROM 95  Shoulder adduction     Shoulder internal rotation NT A T11   Shoulder external rotation AA 35 A T3 PROM 40  Elbow flexion     Elbow extension     Wrist flexion     Wrist extension     Wrist ulnar deviation     Wrist radial deviation     Wrist pronation     Wrist supination     (Blank rows = not tested)  UPPER EXTREMITY MMT:  NT MMT Right eval Left eval  Shoulder flexion    Shoulder extension    Shoulder abduction    Shoulder adduction    Shoulder internal rotation    Shoulder external rotation    Middle trapezius    Lower trapezius    Elbow flexion    Elbow extension    Wrist flexion    Wrist extension    Wrist ulnar deviation     Wrist radial deviation    Wrist pronation    Wrist supination    Grip strength (lbs)    (Blank rows = not tested)  SHOULDER SPECIAL TESTS: NT  JOINT MOBILITY TESTING:  NT  PALPATION:  TTP throughout the R GH region                                                                                                                    TREATMENT DATE:  [facility] Adult PT Treatment:  DATE: 02/26/24 Therapeutic Exercise: PROM flexion, abdct, ER Manual Therapy: Joint mobs INF/POST/DISTRACT 5x10  Therapeutic Activity: Shoulder retraction/shoulder rolls  Supine chest press Supine pullovers Seated table slides with foam roller flexion Seated table slides with foam roller abduction Shoulder pulleys  flexion Upper trap stretch  Modalities: Ice pack right shoulder x 8 minutes       [facility] Adult PT Treatment:                                                DATE: 02/24/24 Therapeutic Exercise: PROM right shoulder flexion, abdct , ER  Manual Therapy: Joint mobs INF/POST/DISTRACT 5x10 STW right upper arm  Therapeutic Activity: Supine flexion with dowel 15 x  Supine press with dowel 15x  Supine ER AAROM with Dowel 15x Seated table flexion 15x- using foam roller Seated table abduction 15x- using foam roller  Modalities: Ice pack right shoulder x 8 minutes       [facility] Adult PT Treatment:                                                DATE: 02/17/24 Therapeutic Exercise: OH pulley 5 min Supine elbow flexion (in neutral shoulder extension) 15 x2 Neuromuscular re-ed: 4 way scapula L side lie 15x ea R pec minor release 2 min Joint mobs INF/POST/DISTRACT 5x10 R shoulder PNF D1 F/E Therapeutic Activity: Supine flexion with Ranger 15x 2 Supine press with Ranger 15x 2 Standing abduction with Ranger 15 x2  [facility] Adult PT Treatment:                                                DATE: 02/13/24 Therapeutic Exercise: OH pulley flexion 5  min Neuromuscular re-ed: 4 way scapula L side lie 15x ea R pec monor release 2 min PROM Therapeutic Activity: Supine flexion with Ranger 15x Supine press with Ranger 15x  Standing abduction with Ranger  Seated table flexion 15x Seated table abduction 15x Seated ER 15x     PATIENT EDUCATION: Education details: Eval findings, POC, HEP, self care  Person educated: Patient Education method: Explanation, Demonstration, Tactile cues, Verbal cues, and Handouts Education comprehension: verbalized understanding, returned demonstration, verbal cues required, and tactile cues required  HOME EXERCISE PROGRAM: Access Code: [email or web address] URL: [email or web address] Date: 02/11/2024 Prepared by: Liborio Reeds  Exercises - Seated Shoulder Flexion Towel Slide at Table Top  - 3 x daily - 7 x weekly - 1 sets - 10 reps - 3 hold - Seated Shoulder Abduction Towel Slide at Table Top (Mirrored)  - 3 x daily - 7 x weekly - 1 sets - 10 reps - 3 hold - Seated Shoulder External Rotation PROM on Table (Mirrored)  - 3 x daily - 7 x weekly - 1 sets - 10 reps - 3 hold  ASSESSMENT:  CLINICAL IMPRESSION: Pt reports 4/10 pain and continues to abdct and ER arm to show what motion hurts. Again reminded patient to avoid reaching up or out until MD follow up. She also admits to reaching and pulling herself up  steps into transportation bus using the surgical arm.  She continues to report pain with sleeping and admits that she sleeps on her surgical side which increases her pain. Added in scap setting and shoulder rolls today as well as upper trap stretch due to pt arriving with her right shoulder elevated and protracted.  Continued with joint mobs, PROM and AAROM as tolerated. Ice pack applied to right shoulder end of session to reduce soreness at end of session.   Patient is a 63 y.o. female who was seen today for physical therapy evaluation and treatment for M75.01 (ICD-10-CM) - Adhesive capsulitis of right  shoulder  M75.21 (ICD-10-CM) - Biceps tendinitis of right shoulder  s/p right shoulder manipulation under anesthesia with mini open bicep tenodesis  PROM and AAROM but no full AROM or ROM behind the back and no strengthening/lifting exercises right shoulder. Post op 01/29/2024. Pt presents with R shoulder ROM limitations consistent with her surgical procedure. Pt was advised against lifting her R hand above chest level which she reports attempting to try in order to exercise her shoulder on her own. A HEP was initiated. Pt will benefit from skilled PT to address impairments to optimize R shoulder function with less pain.   OBJECTIVE IMPAIRMENTS: decreased ROM, decreased strength, impaired UE functional use, and pain.   ACTIVITY LIMITATIONS: carrying, lifting, bathing, toileting, dressing, reach over head, hygiene/grooming, and caring for others  PARTICIPATION LIMITATIONS: meal prep, cleaning, laundry, driving, shopping, and community activity  PERSONAL FACTORS: Past/current experiences, Time since onset of injury/illness/exacerbation, and 3+ comorbidities: DM, anxiety, depression are also affecting patient's functional outcome.   REHAB POTENTIAL: Good  CLINICAL DECISION MAKING: Evolving/moderate complexity  EVALUATION COMPLEXITY: Moderate   GOALS:  SHORT TERM GOALS: Target date: 02/27/24  Pt will be Ind in an initial HEP Baseline: started Goal status: MET  2.  The R shoulder will demonstrate AAROM for flexion 110d, abd 100d, and ER of 50d for appropriate progression with rehab Baseline: see flow sheets Goal status: PARTIALLY MET  LONG TERM GOALS: Target date: 04/16/24  Pt will be Ind in a final HEP to maintain achieved LOF  Baseline:  Goal status: INITIAL  2.  Pt will demonstrate AROM for R shoulder to 120d, abd 100d, and ER 65d for appropriate function of the R UE Baseline: see flow sheets Goal status: INITIAL  3.  Pt will be able to lift a 3 # weight overhead for appropriate  function of the R UE with daily activities Baseline:  Goal status: INITIAL  4.  Pt's Quick Dash score will improve to 40% or less as indication of improved function  Baseline: 71% Goal status: INITIAL  PLAN:  PT FREQUENCY: 2x/week  PT DURATION: 8 weeks  PLANNED INTERVENTIONS: 97164- PT Re-evaluation, 97110-Therapeutic exercises, 97530- Therapeutic activity, [id number]- Neuromuscular re-education, 97535- Self Care, [id number]- Manual therapy, [id number]- Electrical stimulation (unattended), 97033- Ionotophoresis 4mg /ml Dexamethasone , Stair training, Taping, Dry Needling, Joint mobilization, Cryotherapy, and Moist heat  PLAN FOR NEXT SESSION: Assess response to HEP; progress therex as indicated; use of modalities, manual therapy; and TPDN as indicated.  Gasper Karst, PTA 03/01/24 9:53 AM Phone: [phone] Fax: [phone]

## 2024-03-02 ENCOUNTER — Ambulatory Visit

## 2024-03-04 DIAGNOSIS — F330 Major depressive disorder, recurrent, mild: Secondary | ICD-10-CM | POA: Diagnosis not present

## 2024-03-05 ENCOUNTER — Encounter: Payer: Self-pay | Admitting: Physical Therapy

## 2024-03-05 ENCOUNTER — Ambulatory Visit: Admitting: Physical Therapy

## 2024-03-05 DIAGNOSIS — M6281 Muscle weakness (generalized): Secondary | ICD-10-CM

## 2024-03-05 DIAGNOSIS — M25511 Pain in right shoulder: Secondary | ICD-10-CM

## 2024-03-05 DIAGNOSIS — M25611 Stiffness of right shoulder, not elsewhere classified: Secondary | ICD-10-CM

## 2024-03-05 NOTE — Therapy (Addendum)
 OUTPATIENT PHYSICAL THERAPY TREATMENT NOTE/DC   Patient Name: [name] MRN: [id number] DOB:[birth year], 63 y.o., female Today's Date: 03/05/2024  END OF SESSION:  PT End of Session - 03/05/24 1019     Visit Number 6    Number of Visits 17    Date for PT Re-Evaluation 04/16/24    Authorization Type [place] MEDICAID [employer]    Authorization - Number of Visits 27    PT Start Time 1018    PT Stop Time 1058    PT Time Calculation (min) 40 min               Past Medical History:  Diagnosis Date   Anxiety    Arthritis    Depression    Diabetes mellitus without complication (HCC)    GERD (gastroesophageal reflux disease)    Hyperlipidemia    Hypertension    Hypothyroidism    Migraines    Thyroid  disease    Past Surgical History:  Procedure Laterality Date   ANTERIOR CERVICAL DECOMP/DISCECTOMY FUSION N/A 03/11/2023   Procedure: C4-5, C5-6, C6-7 ANTERIOR CERVICAL DECOMPRESSION/DISCECTOMY FUSION 3 LEVELS;  Surgeon: Georgina Ozell LABOR, MD;  Location: [facility] OR;  Service: Orthopedics;  Laterality: N/A;   BICEPS TENDON REPAIR Right 01/29/2024   Procedure: REPAIR, TENDON, BICEPS, PROXIMAL;  Surgeon: Addie Cordella Hamilton, MD;  Location: [facility] OR;  Service: Orthopedics;  Laterality: Right;   BREAST CYST EXCISION Right 1980   BREAST CYST EXCISION Left 1985   EXAM UNDER ANESTHESIA WITH MANIPULATION OF SHOULDER Right 01/29/2024   Procedure: EXAM UNDER ANESTHESIA, SHOULDER, WITH MANIPULATION;  Surgeon: Addie Cordella Hamilton, MD;  Location: [facility] OR;  Service: Orthopedics;  Laterality: Right;  RIGHT SHOULDER MUA with ARTHROSCOPC ROTATOR INTERVAL RELEASE AND POSSIBLE MINI OPEN BICEP TENODESIS   NO PAST SURGERIES     POSTERIOR LUMBAR FUSION 2 WITH HARDWARE REMOVAL Right 01/29/2024   Procedure: ARTHROSCOPY, SHOULDER WITH DEBRIDEMENT;  Surgeon: Addie Cordella Hamilton, MD;  Location: [facility] OR;  Service: Orthopedics;  Laterality: Right;  RIGHT SHOULDER MUA with ARTHROSCOPC ROTATOR INTERVAL RELEASE  AND POSSIBLE MINI OPEN BICEP TENODESIS   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   Brain atrophy (HCC) 12/18/2022   Complete tear of right rotator cuff 05/16/2022   Adhesive capsulitis of right shoulder 03/14/2022   Influenza vaccine needed 10/31/2020   Need for diphtheria-tetanus-pertussis (Tdap) vaccine 10/31/2020   Hyperlipidemia associated with type 2 diabetes mellitus (HCC) 10/31/2020   Overweight (BMI 25.0-29.9) 10/31/2020   Type 2 diabetes mellitus without complication, without long-term current use of insulin  (HCC) 07/31/2020   OSA (obstructive sleep apnea) 02/02/2020   Spondylosis without myelopathy or radiculopathy, lumbar region 09/17/2017   Essential hypertension 09/05/2017   Chronic bilateral low back pain with left-sided sciatica 09/05/2017   HTN (hypertension) 05/05/2017   Hypothyroidism 02/24/2017   Mixed hyperlipidemia 02/24/2017   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   Sleep related headaches 01/23/2017   Snoring 01/23/2017   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   Chronic headaches 10/30/2016   Obesity (BMI 30.0-34.9) 05/24/2016   PMB (postmenopausal bleeding) 05/24/2016   Back pain of lumbar region with sciatica 09/28/2015    PCP: Vicci Barnie NOVAK, MD  REFERRING PROVIDER: Shirly Carlin CROME, PA-C  REFERRING DIAG:  M75.01 (ICD-10-CM) - Adhesive capsulitis of right shoulder  M75.21 (ICD-10-CM) -  Biceps tendinitis of right shoulder    THERAPY DIAG:  Acute pain of right shoulder  Muscle weakness (generalized)  Stiffness of right shoulder, not elsewhere classified  Rationale for Evaluation and Treatment: Rehabilitation  ONSET DATE: 01/29/24 DOS  SUBJECTIVE:                                                                                                                                                                                       SUBJECTIVE STATEMENT:03/05/24:  Pt reports she will see MD next week. She wants to see him before making more appts. Reports she has been trying not to aggravate the shoulder.    02/26/24: My shoulder hurts at night. I sleep on the surgical side because I have always slept on that side. I also have to use right handle to pull myself up into transportation bus.    Hand dominance: Right  PERTINENT HISTORY: DM, anxiety, depression  PAIN:  Are you having pain?Yes: NPRS scale: 0/10. Pain range the week prior to start of Pt: 2-10/10 Pain location: R shoulder Pain description: Throb, constant Aggravating factors: Moving her shoulder the wrong way, Increased pain with sleeping Relieving factors: Rest, support  PRECAUTIONS: PROM and AAROM but no full AROM or ROM behind the back and no strengthening/lifting exercises right shoulder Post op 01/29/2024  RED FLAGS: None   WEIGHT BEARING RESTRICTIONS: No  FALLS:  Has patient fallen in last 6 months? No  LIVING ENVIRONMENT: Lives with: lives with their family Lives in: House/apartment Able to access home  OCCUPATION: Retired. Likes to shop, go to movies, go out to eat  PLOF: Independent with basic ADLs  PATIENT GOALS:Good use of the R shoulder/arm  NEXT MD VISIT:   OBJECTIVE:  Note: Objective measures were completed at Evaluation unless otherwise noted.  PATIENT SURVEYS:  Quick Dash 39/55=71%  COGNITION: Overall cognitive status: Within functional limits for tasks assessed     SENSATION: WFL  POSTURE: Forward head, round shoulder, and flexed trunk  UPPER EXTREMITY ROM:   Active ROM Right eval Left eval Right 02/24/24 Right  03/05/24  Shoulder flexion AA 90 A 120  Supine AA135 140 AA supine  Shoulder extension      Shoulder abduction/scaption AA 80A A 115  PROM 95 100 PROM  Shoulder adduction      Shoulder internal  rotation NT A T11    Shoulder external rotation AA 35 A T3 PROM 40 PROM 50  Elbow flexion      Elbow extension      Wrist flexion      Wrist extension      Wrist ulnar deviation  Wrist radial deviation      Wrist pronation      Wrist supination      (Blank rows = not tested)  UPPER EXTREMITY MMT:  NT MMT Right eval Left eval  Shoulder flexion    Shoulder extension    Shoulder abduction    Shoulder adduction    Shoulder internal rotation    Shoulder external rotation    Middle trapezius    Lower trapezius    Elbow flexion    Elbow extension    Wrist flexion    Wrist extension    Wrist ulnar deviation    Wrist radial deviation    Wrist pronation    Wrist supination    Grip strength (lbs)    (Blank rows = not tested)  SHOULDER SPECIAL TESTS: NT  JOINT MOBILITY TESTING:  NT  PALPATION:  TTP throughout the R GH region                                                                                                                    TREATMENT DATE:  [facility] Adult PT Treatment:              5 weeks post op                                  DATE: 03/05/24 Therapeutic Exercise: PROM right shoulder flexion, abdct , ER    Therapeutic Activity: Standing cane shoulder flex and abdct AAROM Standing Cane shoulder ER AAROM Supine pullover with dowel  Pulleys flexion and scaption Standing wall slides flexion Supine shoulder flexion long lever 10 x 2  Modalities:   Ice pack right shoulder     [facility] Adult PT Treatment:                                                DATE: 02/26/24 Therapeutic Exercise: PROM flexion, abdct, ER Manual Therapy: Joint mobs INF/POST/DISTRACT 5x10  Therapeutic Activity: Shoulder retraction/shoulder rolls  Supine chest press Supine pullovers Seated table slides with foam roller flexion Seated table slides with foam roller abduction Shoulder pulleys  flexion Upper trap stretch  Modalities: Ice pack right shoulder x 8 minutes        [facility] Adult PT Treatment:                                                DATE: 02/24/24 Therapeutic Exercise: PROM right shoulder flexion, abdct , ER  Manual Therapy: Joint mobs INF/POST/DISTRACT 5x10 STW right upper arm  Therapeutic Activity: Supine flexion with dowel 15 x  Supine press with dowel 15x  Supine ER AAROM with Dowel 15x Seated table flexion 15x-  using foam roller Seated table abduction 15x- using foam roller  Modalities: Ice pack right shoulder x 8 minutes       [facility] Adult PT Treatment:                                                DATE: 02/17/24 Therapeutic Exercise: OH pulley 5 min Supine elbow flexion (in neutral shoulder extension) 15 x2 Neuromuscular re-ed: 4 way scapula L side lie 15x ea R pec minor release 2 min Joint mobs INF/POST/DISTRACT 5x10 R shoulder PNF D1 F/E Therapeutic Activity: Supine flexion with Ranger 15x 2 Supine press with Ranger 15x 2 Standing abduction with Ranger 15 x2  [facility] Adult PT Treatment:                                                DATE: 02/13/24 Therapeutic Exercise: OH pulley flexion 5 min Neuromuscular re-ed: 4 way scapula L side lie 15x ea R pec monor release 2 min PROM Therapeutic Activity: Supine flexion with Ranger 15x Supine press with Ranger 15x  Standing abduction with Ranger  Seated table flexion 15x Seated table abduction 15x Seated ER 15x     PATIENT EDUCATION: Education details: Eval findings, POC, HEP, self care  Person educated: Patient Education method: Explanation, Demonstration, Tactile cues, Verbal cues, and Handouts Education comprehension: verbalized understanding, returned demonstration, verbal cues required, and tactile cues required  HOME EXERCISE PROGRAM: Access Code: [email or web address] URL: [email or web address] Date: 02/11/2024 Prepared by: Dasie Daft  Exercises - Seated Shoulder Flexion Towel Slide at Table Top  - 3 x daily - 7 x weekly - 1 sets - 10 reps - 3  hold - Seated Shoulder Abduction Towel Slide at Table Top (Mirrored)  - 3 x daily - 7 x weekly - 1 sets - 10 reps - 3 hold - Seated Shoulder External Rotation PROM on Table (Mirrored)  - 3 x daily - 7 x weekly - 1 sets - 10 reps - 3 hold 03/05/24 - Shoulder Flexion Wall Slide with Towel  - 1 x daily - 7 x weekly - 1-2 sets - 10 reps - Standing Shoulder Abduction ROM with Dowel  - 1 x daily - 7 x weekly - 1-2 sets - 10 reps - Seated Shoulder External Rotation AAROM with Dowel  - 1 x daily - 7 x weekly - 1-2 sets - 10 reps  ASSESSMENT:  CLINICAL IMPRESSION: Pt reports no resting pain on arrival. She has tried to avoid aggravating the shoulder and is experiencing less pain now. Reports compliance with HEP. Able to tolerate progression to more AAROM. Began supine AROM , also well tolerated. Sees MD next week for F/U. Encouraged her to make more appts for PT however she would like to speak to MD first. Her HEP was updated today and she has made improvement in her ROM.     Patient is a 63 y.o. female who was seen today for physical therapy evaluation and treatment for M75.01 (ICD-10-CM) - Adhesive capsulitis of right shoulder  M75.21 (ICD-10-CM) - Biceps tendinitis of right shoulder  s/p right shoulder manipulation under anesthesia with mini open bicep tenodesis  PROM and AAROM but no full AROM or ROM behind the back and no strengthening/lifting  exercises right shoulder. Post op 01/29/2024. Pt presents with R shoulder ROM limitations consistent with her surgical procedure. Pt was advised against lifting her R hand above chest level which she reports attempting to try in order to exercise her shoulder on her own. A HEP was initiated. Pt will benefit from skilled PT to address impairments to optimize R shoulder function with less pain.   OBJECTIVE IMPAIRMENTS: decreased ROM, decreased strength, impaired UE functional use, and pain.   ACTIVITY LIMITATIONS: carrying, lifting, bathing, toileting, dressing,  reach over head, hygiene/grooming, and caring for others  PARTICIPATION LIMITATIONS: meal prep, cleaning, laundry, driving, shopping, and community activity  PERSONAL FACTORS: Past/current experiences, Time since onset of injury/illness/exacerbation, and 3+ comorbidities: DM, anxiety, depression are also affecting patient's functional outcome.   REHAB POTENTIAL: Good  CLINICAL DECISION MAKING: Evolving/moderate complexity  EVALUATION COMPLEXITY: Moderate   GOALS:  SHORT TERM GOALS: Target date: 02/27/24  Pt will be Ind in an initial HEP Baseline: started Goal status: MET  2.  The R shoulder will demonstrate AAROM for flexion 110d, abd 100d, and ER of 50d for appropriate progression with rehab Baseline: see flow sheets Goal status: MET  LONG TERM GOALS: Target date: 04/16/24  Pt will be Ind in a final HEP to maintain achieved LOF  Baseline:  Goal status: INITIAL  2.  Pt will demonstrate AROM for R shoulder to 120d, abd 100d, and ER 65d for appropriate function of the R UE Baseline: see flow sheets Goal status: INITIAL  3.  Pt will be able to lift a 3 # weight overhead for appropriate function of the R UE with daily activities Baseline:  Goal status: INITIAL  4.  Pt's Quick Dash score will improve to 40% or less as indication of improved function  Baseline: 71% Goal status: INITIAL  PLAN:  PT FREQUENCY: 2x/week  PT DURATION: 8 weeks  PLANNED INTERVENTIONS: 97164- PT Re-evaluation, 97110-Therapeutic exercises, 97530- Therapeutic activity, [id number]- Neuromuscular re-education, 97535- Self Care, [id number]- Manual therapy, [id number]- Electrical stimulation (unattended), 97033- Ionotophoresis 4mg /ml Dexamethasone , Stair training, Taping, Dry Needling, Joint mobilization, Cryotherapy, and Moist heat  PLAN FOR NEXT SESSION: Assess response to HEP; progress therex as indicated; use of modalities, manual therapy; and TPDN as indicated.  Harlene Persons, PTA 03/05/24 10:58 AM Phone:  [phone] Fax: [phone]   PHYSICAL THERAPY DISCHARGE SUMMARY  Visits from Start of Care: 6  Current functional level related to goals / functional outcomes: unknown   Remaining deficits: unknown   Education / Equipment: HEP   Patient agrees to discharge. Patient goals were partially met. Patient is being discharged due to not returning since the last visit.  Allen Ralls MS, PT 10/07/2024 9:42 AM

## 2024-03-09 ENCOUNTER — Ambulatory Visit (INDEPENDENT_AMBULATORY_CARE_PROVIDER_SITE_OTHER): Payer: Medicaid Other | Admitting: Neurology

## 2024-03-09 ENCOUNTER — Encounter: Payer: Self-pay | Admitting: Neurology

## 2024-03-09 VITALS — BP 112/80 | HR 91 | Ht 67.0 in | Wt 184.0 lb

## 2024-03-09 DIAGNOSIS — M542 Cervicalgia: Secondary | ICD-10-CM | POA: Diagnosis not present

## 2024-03-09 DIAGNOSIS — M5481 Occipital neuralgia: Secondary | ICD-10-CM | POA: Diagnosis not present

## 2024-03-09 MED ORDER — GABAPENTIN 300 MG PO CAPS: 300.0000 mg | ORAL_CAPSULE | Freq: Every day | ORAL | 4 refills | Status: AC

## 2024-03-09 NOTE — Patient Instructions (Addendum)
 Recommend Lynita Saris perform C2/C3  Medial Branch Blocks for Occipital Neuralgia or consider Radiofrequency Ablation  Prescribed Gabapentin  at bedtime and discuss rest with Dr. Rozelle Corning  Options discussed:  - Consider Occipital nerve blocks - Sports Medicine Dr. Napolean Backbone is very good at neck pain could always refer there -Trigger point injections/dry needling for tight muscles in your shoulders -Lidocaine  patches - salonpas or icy hot or voltaren gel -Medrol  Dosepak - daily for 6 days can help temporarily -Gabapentin  prn or Lyrica or medical management like muscle relaxers(ie flexeril  or others), watch for sedation -Heating pad -Physical Therapy: Cervical myofascial pain, forward posture contributing to occipital neuralgia and cervicalgia. Please evaluate and treat including dry needling, stretching, strengthening, manual therapy/massage, heating, TENS unit, exercising for scapular stabilization, pectoral stretching and rhomboid strengthening as clinically warranted as well as any other modality as recommended by evaluation.  Consider Soft collar at bedtime to see if that helps Address: [address] [place] [place] [place], [place] [address] Phone: [phone] Hours:  Open ? Closes 5:30?PM Suggest an edit        Occipital neuralgia  Occipital Neuralgia  Occipital neuralgia is a type of headache that causes brief episodes of very bad pain in the back of the head. Pain from occipital neuralgia may spread (radiate) to other parts of the head. These headaches may be caused by irritation of the nerves that leave the spinal cord high up in the neck, just below the base of the skull (occipital nerves). The occipital nerves transmit sensations from the back of the head, the top of the head, and the areas behind the ears. What are the causes? This condition can occur without any known cause (primary headache syndrome). In other cases, this condition is caused by pressure on or irritation of one of  the two occipital nerves. Pressure and irritation may be due to: Muscle spasm in the neck. Neck injury. Wear and tear of the vertebrae in the neck (osteoarthritis). Disease of the disks that separate the vertebrae. Swollen blood vessels that put pressure on the occipital nerves. Infections. Tumors. Diabetes. What are the signs or symptoms? This condition causes brief burning, stabbing, electric, shocking, or shooting pain in the back of the head that can radiate to the top of the head. It can happen on one side or both sides of the head. It can also cause: Pain behind the eye. Pain triggered by neck movement or hair brushing. Scalp tenderness. Aching in the back of the head between episodes of very bad pain. Pain that gets worse with exposure to bright lights. How is this diagnosed? Your health care provider may diagnose the condition based on a physical exam and your symptoms. Tests may be done, such as: Imaging studies of the brain and neck (cervical spine), such as an MRI or CT scan. These look for causes of pinched nerves. Applying pressure to the nerves in the neck to try to re-create the pain. Injection of numbing medicine into the occipital nerve areas to see if pain goes away (diagnostic nerve block). How is this treated? Treatment for this condition may begin with simple measures, such as: Rest. Massage. Applying heat or cold to the area. Over-the-counter pain relievers. If these measures do not work, you may need other treatments, including: Medicines, such as: Prescription-strength anti-inflammatory medicines. Muscle relaxants. Anti-seizure medicines, which can relieve pain. Antidepressants, which can relieve pain. Injected medicines, such as medicines that numb the area (local anesthetic) and steroids. Pulsed radiofrequency ablation. This is when  wires are implanted to deliver electrical impulses that block pain signals from the occipital nerve. Surgery to relieve nerve  pressure. Physical therapy. Follow these instructions at home: Managing pain     Avoid any activities that cause pain. Rest when you have an attack of pain. Try gentle massage to relieve pain. Try a different pillow or sleeping position. If directed, apply heat to the affected area as often as told by your health care provider. Use the heat source that your health care provider recommends, such as a moist heat pack or a heating pad. Place a towel between your skin and the heat source. Leave the heat on for 20-30 minutes. Remove the heat if your skin turns bright red. This is especially important if you are unable to feel pain, heat, or cold. You have a greater risk of getting burned. If directed, put ice on the back of your head and neck area. To do this: Put ice in a plastic bag. Place a towel between your skin and the bag. Leave the ice on for 20 minutes, 2-3 times a day. Remove the ice if your skin turns bright red. This is very important. If you cannot feel pain, heat, or cold, you have a greater risk of damage to the area. General instructions Take over-the-counter and prescription medicines only as told by your health care provider. Avoid things that make your symptoms worse, such as bright lights. Try to stay active. Get regular exercise that does not cause pain. Ask your health care provider to suggest safe exercises for you. Work with a physical therapist to learn stretching exercises you can do at home. Practice good posture. Keep all follow-up visits. This is important. Contact a health care provider if: Your medicine is not working. You have new or worsening symptoms. Get help right away if: You have very bad head pain that does not go away. You have a sudden change in vision, balance, or speech. These symptoms may represent a serious problem that is an emergency. Do not wait to see if the symptoms will go away. Get medical help right away. Call your local emergency  services (911 in the [place] Do not drive yourself to the hospital. Summary Occipital neuralgia is a type of headache that causes brief episodes of very bad pain in the back of the head. Pain from occipital neuralgia may spread (radiate) to other parts of the head. Treatment for this condition includes rest, massage, and medicines. This information is not intended to replace advice given to you by your health care provider. Make sure you discuss any questions you have with your health care provider. Document Revised: 08/06/2020 Document Reviewed: 08/06/2020 Elsevier Patient Education  2023 Else[employer] Inc.  Gabapentin  Capsules or Tablets What is this medication? GABAPENTIN  (GA ba pen tin) treats nerve pain. It may also be used to prevent and control seizures in people with epilepsy. It works by calming overactive nerves in your body. This medicine may be used for other purposes; ask your health care provider or pharmacist if you have questions. COMMON BRAND NAME(S): Active-PAC with Gabapentin , Gabarone, Gralise , Neurontin  What should I tell my care team before I take this medication? They need to know if you have any of these conditions: Alcohol or substance use disorder Kidney disease Lung or breathing disease Suicidal thoughts, plans, or attempt; a previous suicide attempt by you or a family member An unusual or allergic reaction to gabapentin , other medications, foods, dyes, or preservatives Pregnant or trying to get  pregnant Breast-feeding How should I use this medication? Take this medication by mouth with a glass of water. Follow the directions on the prescription label. You can take it with or without food. If it upsets your stomach, take it with food. Take your medication at regular intervals. Do not take it more often than directed. Do not stop taking except on your care team's advice. If you are directed to break the 600 or 800 mg tablets in half as part of your dose, the extra half  tablet should be used for the next dose. If you have not used the extra half tablet within 28 days, it should be thrown away. A special MedGuide will be given to you by the pharmacist with each prescription and refill. Be sure to read this information carefully each time. Talk to your care team about the use of this medication in children. While this medication may be prescribed for children as young as 3 years for selected conditions, precautions do apply. Overdosage: If you think you have taken too much of this medicine contact a poison control center or emergency room at once. NOTE: This medicine is only for you. Do not share this medicine with others. What if I miss a dose? If you miss a dose, take it as soon as you can. If it is almost time for your next dose, take only that dose. Do not take double or extra doses. What may interact with this medication? Alcohol Antihistamines for allergy, cough, and cold Certain medications for anxiety or sleep Certain medications for depression like amitriptyline , fluoxetine, sertraline Certain medications for seizures like phenobarbital, primidone Certain medications for stomach problems General anesthetics like halothane, isoflurane, methoxyflurane, propofol  Local anesthetics like lidocaine , pramoxine, tetracaine Medications that relax muscles for surgery Opioid medications for pain Phenothiazines like chlorpromazine, mesoridazine, prochlorperazine, thioridazine This list may not describe all possible interactions. Give your health care provider a list of all the medicines, herbs, non-prescription drugs, or dietary supplements you use. Also tell them if you smoke, drink alcohol, or use illegal drugs. Some items may interact with your medicine. What should I watch for while using this medication? Visit your care team for regular checks on your progress. You may want to keep a record at home of how you feel your condition is responding to treatment. You  may want to share this information with your care team at each visit. You should contact your care team if your seizures get worse or if you have any new types of seizures. Do not stop taking this medication or any of your seizure medications unless instructed by your care team. Stopping your medication suddenly can increase your seizures or their severity. This medication may cause serious skin reactions. They can happen weeks to months after starting the medication. Contact your care team right away if you notice fevers or flu-like symptoms with a rash. The rash may be red or purple and then turn into blisters or peeling of the skin. Or, you might notice a red rash with swelling of the face, lips or lymph nodes in your neck or under your arms. Wear a medical identification bracelet or chain if you are taking this medication for seizures. Carry a card that lists all your medications. This medication may affect your coordination, reaction time, or judgment. Do not drive or operate machinery until you know how this medication affects you. Sit up or stand slowly to reduce the risk of dizzy or fainting spells. Drinking alcohol with this medication  can increase the risk of these side effects. Your mouth may get dry. Chewing sugarless gum or sucking hard candy, and drinking plenty of water may help. Watch for new or worsening thoughts of suicide or depression. This includes sudden changes in mood, behaviors, or thoughts. These changes can happen at any time but are more common in the beginning of treatment or after a change in dose. Call your care team right away if you experience these thoughts or worsening depression. If you become pregnant while using this medication, you may enroll in the [place] [employer] Antiepileptic Drug Pregnancy Registry by calling [phone]. This registry collects information about the safety of antiepileptic medication use during pregnancy. What side effects may I notice from  receiving this medication? Side effects that you should report to your care team as soon as possible: Allergic reactions or angioedema--skin rash, itching, hives, swelling of the face, eyes, lips, tongue, arms, or legs, trouble swallowing or breathing Rash, fever, and swollen lymph nodes Thoughts of suicide or self harm, worsening mood, feelings of depression Trouble breathing Unusual changes in mood or behavior in children after use such as difficulty concentrating, hostility, or restlessness Side effects that usually do not require medical attention (report to your care team if they continue or are bothersome): Dizziness Drowsiness Nausea Swelling of ankles, feet, or hands Vomiting This list may not describe all possible side effects. Call your doctor for medical advice about side effects. You may report side effects to FDA at 1-800-FDA-1088. Where should I keep my medication? Keep out of reach of children and pets. Store at room temperature between 15 and 30 degrees C (59 and 86 degrees F). Get rid of any unused medication after the expiration date. This medication may cause accidental overdose and death if taken by other adults, children, or pets. To get rid of medications that are no longer needed or have expired: Take the medication to a medication take-back program. Check with your pharmacy or law enforcement to find a location. If you cannot return the medication, check the label or package insert to see if the medication should be thrown out in the garbage or flushed down the toilet. If you are not sure, ask your care team. If it is safe to put it in the trash, empty the medication out of the container. Mix the medication with cat litter, dirt, coffee grounds, or other unwanted substance. Seal the mixture in a bag or container. Put it in the trash. NOTE: This sheet is a summary. It may not cover all possible information. If you have questions about this medicine, talk to your doctor,  pharmacist, or health care provider.  2023 Elsevier/Gold Standard (2021-04-09 00:00:00) Methylprednisolone  Tablets What is this medication? METHYLPREDNISOLONE  (meth ill pred NISS oh lone) treats many conditions such as asthma, allergic reactions, arthritis, inflammatory bowel diseases, adrenal, and blood or bone marrow disorders. It works by decreasing inflammation, slowing down an overactive immune system, or replacing cortisol normally made in the body. Cortisol is a hormone that plays an important role in how the body responds to stress, illness, and injury. It belongs to a group of medications called steroids. This medicine may be used for other purposes; ask your health care provider or pharmacist if you have questions. COMMON BRAND NAME(S): Medrol , Medrol  Dosepak What should I tell my care team before I take this medication? They need to know if you have any of these conditions: Cushing's syndrome Eye disease, vision problems Diabetes Glaucoma Heart disease High blood pressure  Infection especially a viral infection, such as chickenpox, cold sores, or herpes Liver disease Mental health conditions Myasthenia gravis Osteoporosis Recent or upcoming vaccine Seizures Stomach or intestine problems Thyroid  disease An unusual or allergic reaction to lactose, methylprednisolone , other medications, foods, dyes, or preservatives Pregnant or trying to get pregnant Breastfeeding How should I use this medication? Take this medication by mouth with a glass of water. Follow the directions on the prescription label. Take this medication with food. If you are taking this medication once a day, take it in the morning. Do not take it more often than directed. Do not suddenly stop taking your medication because you may develop a severe reaction. Your care team will tell you how much medication to take. If your care team wants you to stop the medication, the dose may be slowly lowered over time to avoid  any side effects. Talk to your care team about the use of this medication in children. Special care may be needed. Overdosage: If you think you have taken too much of this medicine contact a poison control center or emergency room at once. NOTE: This medicine is only for you. Do not share this medicine with others. What if I miss a dose? If you miss a dose, take it as soon as you can. If it is almost time for your next dose, talk to your care team. You may need to miss a dose or take an extra dose. Do not take double or extra doses without advice. What may interact with this medication? Do not take this medication with any of the following: Alefacept Echinacea Live virus vaccines Metyrapone Mifepristone This medication may also interact with the following: Amphotericin B Aspirin  and aspirin -like medications Certain antibiotics, such as erythromycin, clarithromycin, troleandomycin Certain medications for diabetes Certain medications for fungal infections, such as ketoconazole Certain medications for seizures, such as carbamazepine, phenobarbital, phenytoin Certain medications that treat or prevent blood clots, such as warfarin Cholestyramine Cyclosporine Digoxin Diuretics Estrogen or progestin hormones Isoniazid NSAIDs, medications for pain and inflammation, such as ibuprofen  or naproxen  Other medications for myasthenia gravis Rifampin Vaccines This list may not describe all possible interactions. Give your health care provider a list of all the medicines, herbs, non-prescription drugs, or dietary supplements you use. Also tell them if you smoke, drink alcohol, or use illegal drugs. Some items may interact with your medicine. What should I watch for while using this medication? Tell your care team if your symptoms do not start to get better or if they get worse. Do not stop taking except on your care team's advice. You may develop a severe reaction. Your care team will tell you how  much medication to take. This medication may increase your risk of getting an infection. Tell your care team if you are around anyone with measles or chickenpox, or if you develop sores or blisters that do not heal properly. This medication may increase blood sugar levels. Ask your care team if changes in diet or medications are needed if you have diabetes. Tell your care team right away if you have any change in your eyesight. Using this medication for a long time may increase your risk of low bone mass. Talk to your care team about bone health. What side effects may I notice from receiving this medication? Side effects that you should report to your care team as soon as possible: Allergic reactions--skin rash, itching, hives, swelling of the face, lips, tongue, or throat Cushing syndrome--increased fat around the midsection,  upper back, neck, or face, pink or purple stretch marks on the skin, thinning, fragile skin that easily bruises, unexpected hair growth High blood sugar (hyperglycemia)--increased thirst or amount of urine, unusual weakness or fatigue, blurry vision Increase in blood pressure Infection--fever, chills, cough, sore throat, wounds that don't heal, pain or trouble when passing urine, general feeling of discomfort or being unwell Low adrenal gland function--nausea, vomiting, loss of appetite, unusual weakness or fatigue, dizziness Mood and behavior changes--anxiety, nervousness, confusion, hallucinations, irritability, hostility, thoughts of suicide or self-harm, worsening mood, feelings of depression Stomach bleeding--bloody or black, tar-like stools, vomiting blood or brown material that looks like coffee grounds Swelling of the ankles, hands, or feet Side effects that usually do not require medical attention (report to your care team if they continue or are bothersome): Acne General discomfort and fatigue Headache Increase in appetite Nausea Trouble sleeping Weight  gain This list may not describe all possible side effects. Call your doctor for medical advice about side effects. You may report side effects to FDA at 1-800-FDA-1088. Where should I keep my medication? Keep out of the reach of children and pets. Store at room temperature between 20 and 25 degrees C (68 and 77 degrees F). Throw away any unused medication after the expiration date. NOTE: This sheet is a summary. It may not cover all possible information. If you have questions about this medicine, talk to your doctor, pharmacist, or health care provider.  2023 Elsevier/Gold Standard (2021-01-02 00:00:00) Lidocaine  Patches What is this medication? LIDOCAINE  (LYE doe kane) treats pain. It works by numbing a specific area of the body, which blocks pain signals going to the brain. It belongs to a group of medications called local anesthetics. This medicine may be used for other purposes; ask your health care provider or pharmacist if you have questions. COMMON BRAND NAME(S): Aspercreme with Lidocaine , Blue-Emu, DERMALID, GEN7T, Lidocan, Lidocare, Lidoderm , Lidofore, LidoReal-30, Lidozo, Salonpas Lidocaine , Xyliderm , ZTlido  What should I tell my care team before I take this medication? They need to know if you have any of these conditions: G6PD deficiency Heart disease Kidney disease Liver disease Skin conditions or sensitivity An unusual or allergic reaction to lidocaine , parabens, other medications, foods, dyes, or preservatives Pregnant or trying to get pregnant Breast-feeding How should I use this medication? This medication is for external use only. Use it as directed on the label. Do not apply to burned or damaged skin. Do not use it more often than directed. Keep using it unless your care team tells you to stop. Talk to your care team about the use of this medication in children. Special care may be needed. Overdosage: If you think you have taken too much of this medicine contact a poison  control center or emergency room at once. NOTE: This medicine is only for you. Do not share this medicine with others. What if I miss a dose? If you miss a dose, use it as soon as you can. If it is almost time for your next dose, use only that dose. Do not use double or extra doses. What may interact with this medication? This medication may interact with the following: Acetaminophen  Certain antibiotics, such as dapsone, nitrofurantoin, aminosalicylic acid, sulfasalazine Certain medications for seizures, such as phenobarbital, phenytoin, valproic acid  Chloroquine Cyclophosphamide Dofetilide Flutamide Hydroxyurea Ifosfamide Local anesthetics, such as lidocaine , pramoxine, tetracaine MAOIs, such as Carbex, Eldepryl, Marplan, Nardil, and Parnate Metoclopramide  Moricizine Nitroglycerin Primaquine Saquinavir Quinine This list may not describe all possible interactions. Give your health  care provider a list of all the medicines, herbs, non-prescription drugs, or dietary supplements you use. Also tell them if you smoke, drink alcohol, or use illegal drugs. Some items may interact with your medicine. What should I watch for while using this medication? Visit your care team for regular checks on your progress. Tell your care team if your symptoms do not start to get better or if they get worse. Be careful to avoid injury while the area is numb, and you are not aware of pain. If you are going to need surgery, an MRI, CT scan, or other procedure, tell your care team that you are using this medication. You may need to remove the patch before the procedure. What side effects may I notice from receiving this medication? Side effects that you should report to your care team as soon as possible: Allergic reactions--skin rash, itching, hives, swelling of the face, lips, tongue, or throat Headache, unusual weakness or fatigue, shortness of breath, nausea, vomiting, rapid heartbeat, blue skin or lips,  which may be signs of methemoglobinemia Heart rhythm changes--fast or irregular heartbeat, dizziness, feeling faint or lightheaded, chest pain, trouble breathing Side effects that usually do not require medical attention (report to your care team if they continue or are bothersome): Change in skin color Irritation at application site This list may not describe all possible side effects. Call your doctor for medical advice about side effects. You may report side effects to FDA at 1-800-FDA-1088. Where should I keep my medication? Keep out of the reach of children and pets. See product for storage information. Each product may have different instructions. Get rid of any unused medication after the expiration date. To get rid of medications that are no longer needed or have expired: Take the medication to a medication take-back program. Check with your pharmacy or law enforcement to find a location. If you cannot return the medication, ask your pharmacist or care team how to get rid of this medication safely. NOTE: This sheet is a summary. It may not cover all possible information. If you have questions about this medicine, talk to your doctor, pharmacist, or health care provider.  2023 [employer] (2021-10-26 00:00:00)

## 2024-03-09 NOTE — Progress Notes (Addendum)
 [facility]    Provider:  Dr Tresia Fruit Requesting Provider: Lawrance Presume, MD Primary Care Provider:  Lawrance Presume, MD  CC:  Cervicalgia/occipital neuralgia  HPI:  [name] is a 63 y.o. female here as requested by Patricia Presume, MD for  03/09/2024: Patient was seen in the past for nocturnal occipital headache s/p C4-C7 ACDF . She has a sleep study that showed mild OSA. She is here today with her son. She is still having headache mainly at night. 8/10. , NO autonomic symptoms.  It is severe in the back of her head. A soft brace may help. Can be severe. In the back of the head(points to the emergence of the occipital nerves) and radiates to the top of the head and behind the eye. She had surgery last year cervical spine and has considerable arthritic changes with crepitus, Neck is very sore. She gets injections in her neck. Turning her head can make shooting pain happen. A burning shooting pain. No other focal neurologic deficits, associated symptoms, inciting events or modifiable factors.   Reviewed notes, labs and imaging from outside physicians, which showed:  3//2024 MRI cervical spine  Discs: Degenerative disease with disc height loss at C4-5, C5-6 and C6-7.   C2-3: No disc protrusion. Mild right facet arthropathy. Moderate-severe right foraminal stenosis. No left foraminal stenosis. No spinal stenosis.   C3-4: Mild broad-based disc bulge. Moderate left and mild right facet arthropathy. Moderate-severe left foraminal stenosis. Mild right foraminal stenosis. No spinal stenosis.   C4-5: Broad left paracentral disc protrusion contacting the left ventral paracentral cervical spinal cord. Moderate left foraminal stenosis. Mild right foraminal stenosis. Mild spinal stenosis.   C5-6: Mild broad-based disc bulge. Bilateral uncovertebral degenerative changes. Severe bilateral foraminal stenosis. Mild spinal stenosis.   C6-7: Broad-based disc bulge.  Mild-moderate left foraminal stenosis. Mild right foraminal stenosis. No spinal stenosis.   C7-T1: Small central disc protrusion. No foraminal or central canal stenosis.   IMPRESSION: 1. Cervical spine spondylosis as described above. 2. No acute osseous injury of the cervical spine.   Interval update 01/07/2017: still waking with headaches. Snoring. Daily headaches. Excessive fatigue. I had ordered a sleep eval unclear why it was not completed will follow up. She could not afford the depakote  or the propranolol . Discussed other options,  Will try amitriptyline  at night.   01/09/2024: CLINICAL DATA:  63 year old female with increasing frequency, severity of nocturnal headaches with dizziness, nausea.   EXAM: CT HEAD WITHOUT CONTRAST   TECHNIQUE: Contiguous axial images were obtained from the base of the skull through the vertex without intravenous contrast.   RADIATION DOSE REDUCTION: This exam was performed according to the departmental dose-optimization program which includes automated exposure control, adjustment of the mA and/or kV according to patient size and/or use of iterative reconstruction technique.   COMPARISON:  Brain MRI 11/15/2016.  Head CT 09/30/2022.   FINDINGS: Brain: Cerebral volume is not significantly changed since 2023, seemingly with chronic disproportionate atrophy of the frontal lobes (series 2, image 19). But other regional brain volume seems maintained.   No midline shift, ventriculomegaly, mass effect, evidence of mass lesion, intracranial hemorrhage or evidence of cortically based acute infarction. Patchy mild to moderate for age cerebral white matter hypodensity which is primarily in the frontal lobes. No cortical encephalomalacia identified.   Vascular: No suspicious intracranial vascular hyperdensity. Calcified atherosclerosis at the skull base.   Skull: Stable and intact.   Sinuses/Orbits: Tympanic cavities, Visualized paranasal sinuses  and mastoids  are clear.   Other: Visualized orbits and scalp soft tissues are within normal limits.   IMPRESSION: 1. No acute intracranial abnormality. 2. But suggestion of disproportionate chronic frontal lobe atrophy. Neurodegenerative disease not excluded. Cerebral Atrophy (ICD10-G31.9). 3. Superimposed mild to moderate chronic white matter changes, nonspecific but most commonly due to small vessel disease.    Review of Systems: Patient complains of symptoms per HPI as well as the following symptoms none. Pertinent negatives and positives per HPI. All others negative.   Social History   Socioeconomic History   Marital status: Single    Spouse name: Not on file   Number of children: 1   Years of education: 12   Highest education level: 12th grade  Occupational History   Occupation: Unemployed  Tobacco Use   Smoking status: Never   Smokeless tobacco: Never  Vaping Use   Vaping status: Never Used  Substance and Sexual Activity   Alcohol use: No    Alcohol/week: 0.0 standard drinks of alcohol   Drug use: No   Sexual activity: Not Currently    Partners: Male  Other Topics Concern   Not on file  Social History Narrative   Lives with son, [name].   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)   Overall Financial Resource Strain (CARDIA)    Difficulty of Paying Living Expenses: Not hard at all  Food Insecurity: No Food Insecurity (11/17/2023)   Hunger Vital Sign    Worried About Running Out of Food in the Last Year: Never true    Ran Out of Food in the Last Year: Never true  Transportation Needs: No Transportation Needs (11/17/2023)   PRAPARE - Administrator, Civil Service (Medical): No    Lack of Transportation (Non-Medical): No  Recent Concern: Transportation Needs - Unmet Transportation Needs (11/13/2023)   PRAPARE - Transportation    Lack of Transportation  (Medical): Yes    Lack of Transportation (Non-Medical): Yes  Physical Activity: Insufficiently Active (11/17/2023)   Exercise Vital Sign    Days of Exercise per Week: 2 days    Minutes of Exercise per Session: 30 min  Stress: Stress Concern Present (11/17/2023)   [employer] of Occupational Health - Occupational Stress Questionnaire    Feeling of Stress : Rather much  Social Connections: Socially Isolated (11/17/2023)   Social Connection and Isolation Panel [NHANES]    Frequency of Communication with Friends and Family: Once a week    Frequency of Social Gatherings with Friends and Family: Once a week    Attends Religious Services: Never    Database administrator or Organizations: No    Attends Banker Meetings: Never    Marital Status: Separated  Intimate Partner Violence: Not At Risk (11/17/2023)   Humiliation, Afraid, Rape, and Kick questionnaire    Fear of Current or Ex-Partner: No    Emotionally Abused: No    Physically Abused: No    Sexually Abused: No    Family History  Problem Relation Age of Onset   Headache Son    Colon cancer Neg Hx    Esophageal cancer Neg Hx    Rectal cancer Neg Hx    Stomach cancer Neg Hx    Breast cancer Neg Hx    Migraines Neg Hx     Past Medical History:  Diagnosis Date  Anxiety    Arthritis    Depression    Diabetes mellitus without complication (HCC)    GERD (gastroesophageal reflux disease)    Hyperlipidemia    Hypertension    Hypothyroidism    Migraines    Thyroid  disease     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   Brain atrophy (HCC) 12/18/2022   Complete tear of right rotator cuff 05/16/2022   Adhesive capsulitis of right shoulder 03/14/2022   Influenza vaccine needed 10/31/2020   Need  for diphtheria-tetanus-pertussis (Tdap) vaccine 10/31/2020   Hyperlipidemia associated with type 2 diabetes mellitus (HCC) 10/31/2020   Overweight (BMI 25.0-29.9) 10/31/2020   Type 2 diabetes mellitus without complication, without long-term current use of insulin  (HCC) 07/31/2020   OSA (obstructive sleep apnea) 02/02/2020   Spondylosis without myelopathy or radiculopathy, lumbar region 09/17/2017   Essential hypertension 09/05/2017   Chronic bilateral low back pain with left-sided sciatica 09/05/2017   HTN (hypertension) 05/05/2017   Hypothyroidism 02/24/2017   Mixed hyperlipidemia 02/24/2017   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   Sleep related headaches 01/23/2017   Snoring 01/23/2017   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   Chronic headaches 10/30/2016   Obesity (BMI 30.0-34.9) 05/24/2016   PMB (postmenopausal bleeding) 05/24/2016   Back pain of lumbar region with sciatica 09/28/2015    Past Surgical History:  Procedure Laterality Date   ANTERIOR CERVICAL DECOMP/DISCECTOMY FUSION N/A 03/11/2023   Procedure: C4-5, C5-6, C6-7 ANTERIOR CERVICAL DECOMPRESSION/DISCECTOMY FUSION 3 LEVELS;  Surgeon: Diedra Fowler, MD;  Location: [facility] OR;  Service: Orthopedics;  Laterality: N/A;   BICEPS TENDON REPAIR Right 01/29/2024   Procedure: REPAIR, TENDON, BICEPS, PROXIMAL;  Surgeon: Jasmine Mesi, MD;  Location: [facility] OR;  Service: Orthopedics;  Laterality: Right;   BREAST CYST EXCISION Right 1980   BREAST CYST EXCISION Left 1985   EXAM UNDER ANESTHESIA WITH MANIPULATION OF SHOULDER Right 01/29/2024   Procedure: EXAM UNDER ANESTHESIA, SHOULDER, WITH MANIPULATION;  Surgeon: Jasmine Mesi, MD;  Location: [facility] OR;  Service: Orthopedics;  Laterality: Right;  RIGHT SHOULDER MUA with ARTHROSCOPC ROTATOR INTERVAL RELEASE AND POSSIBLE MINI OPEN BICEP TENODESIS   NO PAST SURGERIES     POSTERIOR LUMBAR FUSION 2 WITH HARDWARE REMOVAL Right 01/29/2024   Procedure: ARTHROSCOPY, SHOULDER WITH  DEBRIDEMENT;  Surgeon: Jasmine Mesi, MD;  Location: [facility];  Service: Orthopedics;  Laterality: Right;  RIGHT SHOULDER MUA with ARTHROSCOPC ROTATOR INTERVAL RELEASE AND POSSIBLE MINI OPEN BICEP TENODESIS    Current Outpatient Medications  Medication Sig Dispense Refill   Accu-Chek FastClix Lancets MISC USE AS DIRECTED TO CHECK BLOOD SUGAR THREE TIMES DAILY 102 each 6   ACCU-CHEK GUIDE TEST test strip USE TO TEST THREE TIMES DAILY 100 strip 6   Accu-Chek Softclix Lancets lancets Use as instructed to check blood sugar three times daily 100 each 6   acetaminophen  (TYLENOL ) 500 MG tablet Take 1,000 mg by mouth daily as needed for mild pain (pain score 1-3) or moderate pain (pain score 4-6).     amitriptyline  (ELAVIL ) 50 MG tablet Take 1 tablet (50 mg total) by mouth at bedtime. (Patient taking differently: Take 50 mg by mouth at bedtime as needed for sleep.) 90 tablet 1   amLODipine  (NORVASC ) 10 MG tablet TAKE ONE TABLET BY MOUTH EVERY DAY 90 tablet 1  atorvastatin  (LIPITOR) 80 MG tablet TAKE ONE TABLET BY MOUTH EVERY DAY 90 tablet 1   busPIRone  (BUSPAR ) 10 MG tablet Take 10 mg by mouth daily.     celecoxib  (CELEBREX ) 100 MG capsule Take 1 capsule (100 mg total) by mouth 2 (two) times daily. 60 capsule 0   DULoxetine  (CYMBALTA ) 30 MG capsule Take 1 capsule (30 mg total) by mouth 2 (two) times daily. (Patient taking differently: Take 30 mg by mouth daily.) 180 capsule 1   gabapentin  (NEURONTIN ) 300 MG capsule Take 1 capsule (300 mg total) by mouth at bedtime. 90 capsule 4   levothyroxine  (SYNTHROID ) 50 MCG tablet TAKE ONE TABLET BY MOUTH EVERY MORNING 90 tablet 1   lisinopril  (ZESTRIL ) 20 MG tablet Take 1 tablet (20 mg total) by mouth daily. 90 tablet 1   lurasidone (LATUDA) 20 MG TABS tablet Take 20 mg by mouth at bedtime.     omeprazole  (PRILOSEC) 20 MG capsule Take 1 capsule (20 mg total) by mouth daily. 90 capsule 0   oxyCODONE  (ROXICODONE ) 5 MG immediate release tablet Take 1 tablet (5 mg  total) by mouth every 4 (four) hours as needed for severe pain (pain score 7-10). 30 tablet 0   propranolol  (INDERAL ) 80 MG tablet Take 1 tablet (80 mg total) by mouth 2 (two) times daily. (Patient taking differently: Take 80 mg by mouth 3 (three) times daily.) 180 tablet 0   TRULICITY  1.5 MG/0.5ML SOAJ INJECT 1.5mg  SUBCUTANEOUSLY Once weekly (Patient taking differently: Inject 1.5 mg into the skin once a week. Thursdays) 2 mL 6   cyclobenzaprine  (FLEXERIL ) 10 MG tablet TAKE ONE TABLET BY MOUTH TWICE DAILY AS NEEDED FOR muscla FOR PAIN AND SPASMS 60 tablet 1   No current facility-administered medications for this visit.    Allergies as of 03/09/2024 - Review Complete 03/09/2024  Allergen Reaction Noted   Iron Diarrhea 04/22/2016   Metformin  and related Nausea And Vomiting 12/29/2020   Penicillins Nausea And Vomiting 10/20/2013    Vitals: BP 112/80   Pulse 91   Ht 5\' 7"  (1.702 m)   Wt 184 lb (83.5 kg)   LMP 06/06/2015   BMI 28.82 kg/m  Last Weight:  Wt Readings from Last 1 Encounters:  03/09/24 184 lb (83.5 kg)   Last Height:   Ht Readings from Last 1 Encounters:  03/09/24 5\' 7"  (1.702 m)     Physical exam: Exam: Gen: NAD, conversant, well nourised, well groomed                     CV: RRR, no MRG. No Carotid Bruits. No peripheral edema, warm, nontender Eyes: Conjunctivae clear without exudates or hemorrhage  Neuro: Detailed Neurologic Exam  Speech:    Speech is normal; fluent and spontaneous with normal comprehension.  Cognition:    The patient is oriented to person, place, and time;     recent and remote memory intact;     language fluent;     normal attention, concentration,     fund of knowledge Cranial Nerves:    The pupils are equal, round, and reactive to light. Pupils too small to visualize fundi. Visual fields are full to finger confrontation. Extraocular movements are intact. Trigeminal sensation is intact and the muscles of mastication are normal. The face  is symmetric. The palate elevates in the midline. Hearing intact. Voice is normal. Shoulder shrug is normal. The tongue has normal motion without fasciculations.   Coordination: nml  Gait: nml  Motor Observation:  No asymmetry, no atrophy, and no involuntary movements noted. Tone:    Normal muscle tone.    Posture:    Posture is normal. normal erect    Strength:    Strength is V/V in the upper and lower limbs.      Sensation: intact to LT     Reflex Exam:  DTR's:    Deep tendon reflexes in the upper and lower extremities are symmetrical bilaterally.   Toes:    The toes are equiv bilaterally.   Clonus:    Clonus is absent.    Assessment/Plan:  63 year old female with widespread cervical degenerative disease s/p ACDF with occipital neuralgia and neck pain with movement. Neuro exam in otherwise non focal. Pain is is located in the distribution of the greater, lesser and/or third occipital nerves, paroxysmal and brief, painful, sharp, with tenderness and trigger points at the emergence of the greater occipital nerve. Likely due to facet arthropathy, foraminal stenosis, pain in the upper cervical joints or disks, suboccipital or upper posterior neck muscles including the traps/scm,or compressive disk disease or others.   Recommend Ortho perform ESI, C2/C3  Medial Branch Blocks for Occipital Neuralgia or consider Radiofrequency Ablation - will send to ortho care for evaluation of pain procedures  Prescribed Gabapentin  at bedtime and discuss rest with Dr. Rozelle Corning  Options discussed:  - Consider Occipital nerve blocks - Sports Medicine Dr. Napolean Backbone is very good at neck pain could always refer there -Trigger point injections/dry needling for tight muscles in your shoulders -Lidocaine  patches - salonpas or icy hot or voltaren gel -Medrol  Dosepak - daily for 6 days can help temporarily -Gabapentin  prn or Lyrica or medical management like muscle relaxers(ie flexeril  or others), watch  for sedation -Heating pad -Physical Therapy: Cervical myofascial pain, forward posture contributing to occipital neuralgia and cervicalgia. Please evaluate and treat including dry needling, stretching, strengthening, manual therapy/massage, heating, TENS unit, exercising for scapular stabilization, pectoral stretching and rhomboid strengthening as clinically warranted as well as any other modality as recommended by evaluation.  Consider Soft collar at bedtime to see if that helps Address: [address] [place], [place] [address] Phone: [phone] Hours:  Open ? Closes 5:30?PM Suggest an edit  No orders of the defined types were placed in this encounter.  Meds ordered this encounter  Medications   gabapentin  (NEURONTIN ) 300 MG capsule    Sig: Take 1 capsule (300 mg total) by mouth at bedtime.    Dispense:  90 capsule    Refill:  4          Occipital neuralgia  Occipital Neuralgia  Occipital neuralgia is a type of headache that causes brief episodes of very bad pain in the back of the head. Pain from occipital neuralgia may spread (radiate) to other parts of the head. These headaches may be caused by irritation of the nerves that leave the spinal cord high up in the neck, just below the base of the skull (occipital nerves). The occipital nerves transmit sensations from the back of the head, the top of the head, and the areas behind the ears. What are the causes? This condition can occur without any known cause (primary headache syndrome). In other cases, this condition is caused by pressure on or irritation of one of the two occipital nerves. Pressure and irritation may be due to: Muscle spasm in the neck. Neck injury. Wear and tear of the vertebrae in the neck (osteoarthritis). Disease of the disks that separate the  vertebrae. Swollen blood vessels that put pressure on the occipital nerves. Infections. Tumors. Diabetes. What are the signs or symptoms? This  condition causes brief burning, stabbing, electric, shocking, or shooting pain in the back of the head that can radiate to the top of the head. It can happen on one side or both sides of the head. It can also cause: Pain behind the eye. Pain triggered by neck movement or hair brushing. Scalp tenderness. Aching in the back of the head between episodes of very bad pain. Pain that gets worse with exposure to bright lights. How is this diagnosed? Your health care provider may diagnose the condition based on a physical exam and your symptoms. Tests may be done, such as: Imaging studies of the brain and neck (cervical spine), such as an MRI or CT scan. These look for causes of pinched nerves. Applying pressure to the nerves in the neck to try to re-create the pain. Injection of numbing medicine into the occipital nerve areas to see if pain goes away (diagnostic nerve block). How is this treated? Treatment for this condition may begin with simple measures, such as: Rest. Massage. Applying heat or cold to the area. Over-the-counter pain relievers. If these measures do not work, you may need other treatments, including: Medicines, such as: Prescription-strength anti-inflammatory medicines. Muscle relaxants. Anti-seizure medicines, which can relieve pain. Antidepressants, which can relieve pain. Injected medicines, such as medicines that numb the area (local anesthetic) and steroids. Pulsed radiofrequency ablation. This is when wires are implanted to deliver electrical impulses that block pain signals from the occipital nerve. Surgery to relieve nerve pressure. Physical therapy. Follow these instructions at home: Managing pain     Avoid any activities that cause pain. Rest when you have an attack of pain. Try gentle massage to relieve pain. Try a different pillow or sleeping position. If directed, apply heat to the affected area as often as told by your health care provider. Use the heat  source that your health care provider recommends, such as a moist heat pack or a heating pad. Place a towel between your skin and the heat source. Leave the heat on for 20-30 minutes. Remove the heat if your skin turns bright red. This is especially important if you are unable to feel pain, heat, or cold. You have a greater risk of getting burned. If directed, put ice on the back of your head and neck area. To do this: Put ice in a plastic bag. Place a towel between your skin and the bag. Leave the ice on for 20 minutes, 2-3 times a day. Remove the ice if your skin turns bright red. This is very important. If you cannot feel pain, heat, or cold, you have a greater risk of damage to the area. General instructions Take over-the-counter and prescription medicines only as told by your health care provider. Avoid things that make your symptoms worse, such as bright lights. Try to stay active. Get regular exercise that does not cause pain. Ask your health care provider to suggest safe exercises for you. Work with a physical therapist to learn stretching exercises you can do at home. Practice good posture. Keep all follow-up visits. This is important. Contact a health care provider if: Your medicine is not working. You have new or worsening symptoms. Get help right away if: You have very bad head pain that does not go away. You have a sudden change in vision, balance, or speech. These symptoms may represent a serious problem that  is an emergency. Do not wait to see if the symptoms will go away. Get medical help right away. Call your local emergency services (911 in the [place] Do not drive yourself to the hospital. Summary Occipital neuralgia is a type of headache that causes brief episodes of very bad pain in the back of the head. Pain from occipital neuralgia may spread (radiate) to other parts of the head. Treatment for this condition includes rest, massage, and medicines. This information is  not intended to replace advice given to you by your health care provider. Make sure you discuss any questions you have with your health care provider. Document Revised: 08/06/2020 Document Reviewed: 08/06/2020 Elsevier Patient Education  2023 [employer] Inc[employer]  Gabapentin  Capsules or Tablets What is this medication? GABAPENTIN  (GA ba pen tin) treats nerve pain. It may also be used to prevent and control seizures in people with epilepsy. It works by calming overactive nerves in your body. This medicine may be used for other purposes; ask your health care provider or pharmacist if you have questions. COMMON BRAND NAME(S): Active-PAC with Gabapentin , Gabarone, Gralise , Neurontin  What should I tell my care team before I take this medication? They need to know if you have any of these conditions: Alcohol or substance use disorder Kidney disease Lung or breathing disease Suicidal thoughts, plans, or attempt; a previous suicide attempt by you or a family member An unusual or allergic reaction to gabapentin , other medications, foods, dyes, or preservatives Pregnant or trying to get pregnant Breast-feeding How should I use this medication? Take this medication by mouth with a glass of water. Follow the directions on the prescription label. You can take it with or without food. If it upsets your stomach, take it with food. Take your medication at regular intervals. Do not take it more often than directed. Do not stop taking except on your care team's advice. If you are directed to break the 600 or 800 mg tablets in half as part of your dose, the extra half tablet should be used for the next dose. If you have not used the extra half tablet within 28 days, it should be thrown away. A special MedGuide will be given to you by the pharmacist with each prescription and refill. Be sure to read this information carefully each time. Talk to your care team about the use of this medication in children. While this  medication may be prescribed for children as young as 3 years for selected conditions, precautions do apply. Overdosage: If you think you have taken too much of this medicine contact a poison control center or emergency room at once. NOTE: This medicine is only for you. Do not share this medicine with others. What if I miss a dose? If you miss a dose, take it as soon as you can. If it is almost time for your next dose, take only that dose. Do not take double or extra doses. What may interact with this medication? Alcohol Antihistamines for allergy, cough, and cold Certain medications for anxiety or sleep Certain medications for depression like amitriptyline , fluoxetine, sertraline Certain medications for seizures like phenobarbital, primidone Certain medications for stomach problems General anesthetics like halothane, isoflurane, methoxyflurane, propofol  Local anesthetics like lidocaine , pramoxine, tetracaine Medications that relax muscles for surgery Opioid medications for pain Phenothiazines like chlorpromazine, mesoridazine, prochlorperazine, thioridazine This list may not describe all possible interactions. Give your health care provider a list of all the medicines, herbs, non-prescription drugs, or dietary supplements you use. Also tell them  if you smoke, drink alcohol, or use illegal drugs. Some items may interact with your medicine. What should I watch for while using this medication? Visit your care team for regular checks on your progress. You may want to keep a record at home of how you feel your condition is responding to treatment. You may want to share this information with your care team at each visit. You should contact your care team if your seizures get worse or if you have any new types of seizures. Do not stop taking this medication or any of your seizure medications unless instructed by your care team. Stopping your medication suddenly can increase your seizures or their  severity. This medication may cause serious skin reactions. They can happen weeks to months after starting the medication. Contact your care team right away if you notice fevers or flu-like symptoms with a rash. The rash may be red or purple and then turn into blisters or peeling of the skin. Or, you might notice a red rash with swelling of the face, lips or lymph nodes in your neck or under your arms. Wear a medical identification bracelet or chain if you are taking this medication for seizures. Carry a card that lists all your medications. This medication may affect your coordination, reaction time, or judgment. Do not drive or operate machinery until you know how this medication affects you. Sit up or stand slowly to reduce the risk of dizzy or fainting spells. Drinking alcohol with this medication can increase the risk of these side effects. Your mouth may get dry. Chewing sugarless gum or sucking hard candy, and drinking plenty of water may help. Watch for new or worsening thoughts of suicide or depression. This includes sudden changes in mood, behaviors, or thoughts. These changes can happen at any time but are more common in the beginning of treatment or after a change in dose. Call your care team right away if you experience these thoughts or worsening depression. If you become pregnant while using this medication, you may enroll in the [place] [employer] Antiepileptic Drug Pregnancy Registry by calling 902-296-7058. This registry collects information about the safety of antiepileptic medication use during pregnancy. What side effects may I notice from receiving this medication? Side effects that you should report to your care team as soon as possible: Allergic reactions or angioedema--skin rash, itching, hives, swelling of the face, eyes, lips, tongue, arms, or legs, trouble swallowing or breathing Rash, fever, and swollen lymph nodes Thoughts of suicide or self harm, worsening mood, feelings of  depression Trouble breathing Unusual changes in mood or behavior in children after use such as difficulty concentrating, hostility, or restlessness Side effects that usually do not require medical attention (report to your care team if they continue or are bothersome): Dizziness Drowsiness Nausea Swelling of ankles, feet, or hands Vomiting This list may not describe all possible side effects. Call your doctor for medical advice about side effects. You may report side effects to FDA at 1-800-FDA-1088. Where should I keep my medication? Keep out of reach of children and pets. Store at room temperature between 15 and 30 degrees C (59 and 86 degrees F). Get rid of any unused medication after the expiration date. This medication may cause accidental overdose and death if taken by other adults, children, or pets. To get rid of medications that are no longer needed or have expired: Take the medication to a medication take-back program. Check with your pharmacy or law enforcement to find a location.  If you cannot return the medication, check the label or package insert to see if the medication should be thrown out in the garbage or flushed down the toilet. If you are not sure, ask your care team. If it is safe to put it in the trash, empty the medication out of the container. Mix the medication with cat litter, dirt, coffee grounds, or other unwanted substance. Seal the mixture in a bag or container. Put it in the trash. NOTE: This sheet is a summary. It may not cover all possible information. If you have questions about this medicine, talk to your doctor, pharmacist, or health care provider.  2023 Elsevier/Gold Standard (2021-04-09 00:00:00) Methylprednisolone  Tablets What is this medication? METHYLPREDNISOLONE  (meth ill pred NISS oh lone) treats many conditions such as asthma, allergic reactions, arthritis, inflammatory bowel diseases, adrenal, and blood or bone marrow disorders. It works by  decreasing inflammation, slowing down an overactive immune system, or replacing cortisol normally made in the body. Cortisol is a hormone that plays an important role in how the body responds to stress, illness, and injury. It belongs to a group of medications called steroids. This medicine may be used for other purposes; ask your health care provider or pharmacist if you have questions. COMMON BRAND NAME(S): Medrol , Medrol  Dosepak What should I tell my care team before I take this medication? They need to know if you have any of these conditions: Cushing's syndrome Eye disease, vision problems Diabetes Glaucoma Heart disease High blood pressure Infection especially a viral infection, such as chickenpox, cold sores, or herpes Liver disease Mental health conditions Myasthenia gravis Osteoporosis Recent or upcoming vaccine Seizures Stomach or intestine problems Thyroid  disease An unusual or allergic reaction to lactose, methylprednisolone , other medications, foods, dyes, or preservatives Pregnant or trying to get pregnant Breastfeeding How should I use this medication? Take this medication by mouth with a glass of water. Follow the directions on the prescription label. Take this medication with food. If you are taking this medication once a day, take it in the morning. Do not take it more often than directed. Do not suddenly stop taking your medication because you may develop a severe reaction. Your care team will tell you how much medication to take. If your care team wants you to stop the medication, the dose may be slowly lowered over time to avoid any side effects. Talk to your care team about the use of this medication in children. Special care may be needed. Overdosage: If you think you have taken too much of this medicine contact a poison control center or emergency room at once. NOTE: This medicine is only for you. Do not share this medicine with others. What if I miss a dose? If  you miss a dose, take it as soon as you can. If it is almost time for your next dose, talk to your care team. You may need to miss a dose or take an extra dose. Do not take double or extra doses without advice. What may interact with this medication? Do not take this medication with any of the following: Alefacept Echinacea Live virus vaccines Metyrapone Mifepristone This medication may also interact with the following: Amphotericin B Aspirin  and aspirin -like medications Certain antibiotics, such as erythromycin, clarithromycin, troleandomycin Certain medications for diabetes Certain medications for fungal infections, such as ketoconazole Certain medications for seizures, such as carbamazepine, phenobarbital, phenytoin Certain medications that treat or prevent blood clots, such as warfarin Cholestyramine Cyclosporine Digoxin Diuretics Estrogen or progestin hormones Isoniazid  NSAIDs, medications for pain and inflammation, such as ibuprofen  or naproxen  Other medications for myasthenia gravis Rifampin Vaccines This list may not describe all possible interactions. Give your health care provider a list of all the medicines, herbs, non-prescription drugs, or dietary supplements you use. Also tell them if you smoke, drink alcohol, or use illegal drugs. Some items may interact with your medicine. What should I watch for while using this medication? Tell your care team if your symptoms do not start to get better or if they get worse. Do not stop taking except on your care team's advice. You may develop a severe reaction. Your care team will tell you how much medication to take. This medication may increase your risk of getting an infection. Tell your care team if you are around anyone with measles or chickenpox, or if you develop sores or blisters that do not heal properly. This medication may increase blood sugar levels. Ask your care team if changes in diet or medications are needed if you  have diabetes. Tell your care team right away if you have any change in your eyesight. Using this medication for a long time may increase your risk of low bone mass. Talk to your care team about bone health. What side effects may I notice from receiving this medication? Side effects that you should report to your care team as soon as possible: Allergic reactions--skin rash, itching, hives, swelling of the face, lips, tongue, or throat Cushing syndrome--increased fat around the midsection, upper back, neck, or face, pink or purple stretch marks on the skin, thinning, fragile skin that easily bruises, unexpected hair growth High blood sugar (hyperglycemia)--increased thirst or amount of urine, unusual weakness or fatigue, blurry vision Increase in blood pressure Infection--fever, chills, cough, sore throat, wounds that don't heal, pain or trouble when passing urine, general feeling of discomfort or being unwell Low adrenal gland function--nausea, vomiting, loss of appetite, unusual weakness or fatigue, dizziness Mood and behavior changes--anxiety, nervousness, confusion, hallucinations, irritability, hostility, thoughts of suicide or self-harm, worsening mood, feelings of depression Stomach bleeding--bloody or black, tar-like stools, vomiting blood or brown material that looks like coffee grounds Swelling of the ankles, hands, or feet Side effects that usually do not require medical attention (report to your care team if they continue or are bothersome): Acne General discomfort and fatigue Headache Increase in appetite Nausea Trouble sleeping Weight gain This list may not describe all possible side effects. Call your doctor for medical advice about side effects. You may report side effects to FDA at 1-800-FDA-1088. Where should I keep my medication? Keep out of the reach of children and pets. Store at room temperature between 20 and 25 degrees C (68 and 77 degrees F). Throw away any unused  medication after the expiration date. NOTE: This sheet is a summary. It may not cover all possible information. If you have questions about this medicine, talk to your doctor, pharmacist, or health care provider.  2023 Elsevier/Gold Standard (2021-01-02 00:00:00) Lidocaine  Patches What is this medication? LIDOCAINE  (LYE doe kane) treats pain. It works by numbing a specific area of the body, which blocks pain signals going to the brain. It belongs to a group of medications called local anesthetics. This medicine may be used for other purposes; ask your health care provider or pharmacist if you have questions. COMMON BRAND NAME(S): Aspercreme with Lidocaine , Blue-Emu, DERMALID, GEN7T, Lidocan, Lidocare, Lidoderm , Lidofore, LidoReal-30, Lidozo, Salonpas Lidocaine , Xyliderm , ZTlido  What should I tell my care team before I take this  medication? They need to know if you have any of these conditions: G6PD deficiency Heart disease Kidney disease Liver disease Skin conditions or sensitivity An unusual or allergic reaction to lidocaine , parabens, other medications, foods, dyes, or preservatives Pregnant or trying to get pregnant Breast-feeding How should I use this medication? This medication is for external use only. Use it as directed on the label. Do not apply to burned or damaged skin. Do not use it more often than directed. Keep using it unless your care team tells you to stop. Talk to your care team about the use of this medication in children. Special care may be needed. Overdosage: If you think you have taken too much of this medicine contact a poison control center or emergency room at once. NOTE: This medicine is only for you. Do not share this medicine with others. What if I miss a dose? If you miss a dose, use it as soon as you can. If it is almost time for your next dose, use only that dose. Do not use double or extra doses. What may interact with this medication? This medication may  interact with the following: Acetaminophen  Certain antibiotics, such as dapsone, nitrofurantoin, aminosalicylic acid, sulfasalazine Certain medications for seizures, such as phenobarbital, phenytoin, valproic acid  Chloroquine Cyclophosphamide Dofetilide Flutamide Hydroxyurea Ifosfamide Local anesthetics, such as lidocaine , pramoxine, tetracaine MAOIs, such as Carbex, Eldepryl, Marplan, Nardil, and Parnate Metoclopramide  Moricizine Nitroglycerin Primaquine Saquinavir Quinine This list may not describe all possible interactions. Give your health care provider a list of all the medicines, herbs, non-prescription drugs, or dietary supplements you use. Also tell them if you smoke, drink alcohol, or use illegal drugs. Some items may interact with your medicine. What should I watch for while using this medication? Visit your care team for regular checks on your progress. Tell your care team if your symptoms do not start to get better or if they get worse. Be careful to avoid injury while the area is numb, and you are not aware of pain. If you are going to need surgery, an MRI, CT scan, or other procedure, tell your care team that you are using this medication. You may need to remove the patch before the procedure. What side effects may I notice from receiving this medication? Side effects that you should report to your care team as soon as possible: Allergic reactions--skin rash, itching, hives, swelling of the face, lips, tongue, or throat Headache, unusual weakness or fatigue, shortness of breath, nausea, vomiting, rapid heartbeat, blue skin or lips, which may be signs of methemoglobinemia Heart rhythm changes--fast or irregular heartbeat, dizziness, feeling faint or lightheaded, chest pain, trouble breathing Side effects that usually do not require medical attention (report to your care team if they continue or are bothersome): Change in skin color Irritation at application site This list  may not describe all possible side effects. Call your doctor for medical advice about side effects. You may report side effects to FDA at 1-800-FDA-1088. Where should I keep my medication? Keep out of the reach of children and pets. See product for storage information. Each product may have different instructions. Get rid of any unused medication after the expiration date. To get rid of medications that are no longer needed or have expired: Take the medication to a medication take-back program. Check with your pharmacy or law enforcement to find a location. If you cannot return the medication, ask your pharmacist or care team how to get rid of this medication safely. NOTE: This  sheet is a summary. It may not cover all possible information. If you have questions about this medicine, talk to your doctor, pharmacist, or health care provider.  2023 Elsevier/Gold Standard (2021-10-26 00:00:00)      No orders of the defined types were placed in this encounter.  Meds ordered this encounter  Medications   gabapentin  (NEURONTIN ) 300 MG capsule    Sig: Take 1 capsule (300 mg total) by mouth at bedtime.    Dispense:  90 capsule    Refill:  4    Cc: Patricia Presume, MD,  Patricia Presume, MD  Aldona Amel, MD  [facility] [address] [place], [place] [address]  Phone [phone] Fax [phone]

## 2024-03-10 ENCOUNTER — Other Ambulatory Visit: Payer: Self-pay

## 2024-03-10 ENCOUNTER — Ambulatory Visit (INDEPENDENT_AMBULATORY_CARE_PROVIDER_SITE_OTHER): Admitting: Orthopedic Surgery

## 2024-03-10 ENCOUNTER — Encounter: Payer: Self-pay | Admitting: Neurology

## 2024-03-10 DIAGNOSIS — G8929 Other chronic pain: Secondary | ICD-10-CM

## 2024-03-10 DIAGNOSIS — M5412 Radiculopathy, cervical region: Secondary | ICD-10-CM

## 2024-03-10 DIAGNOSIS — M25511 Pain in right shoulder: Secondary | ICD-10-CM

## 2024-03-11 ENCOUNTER — Encounter: Payer: Self-pay | Admitting: Orthopedic Surgery

## 2024-03-11 NOTE — Progress Notes (Signed)
 Post-Op Visit Note   Patient: [name]           Date of Birth: 11-18-1960           MRN: [id number] Visit Date: 03/10/2024 PCP: Patricia Presume, MD   Assessment & Plan:  Chief Complaint:  Chief Complaint  Patient presents with   Right Shoulder - Follow-up    01/29/2024 Right shoulder MUA   Visit Diagnoses:  1. Chronic right shoulder pain   2. Cervical radiculopathy     Plan: [name] is a patient who underwent right shoulder manipulation under anesthesia 1425.  She states she is not quite near yet with her shoulder.  She is in physical therapy.  Stiffens up after therapy.  Does a home exercise program.  Works the shoulder out maybe 1 hour or so a day by her admission.  On exam her range of motion is 45/80/110.  Plan at this time is basically to continue trying to stretch the shoulder as much as possible.  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  Continue physical therapy at [name] 2 times a week for 6 more weeks for range of motion.  Follow-Up Instructions: Return in about 6 weeks (around 04/21/2024).   Orders:  Orders Placed This Encounter  Procedures   Ambulatory referral to Physical Medicine Rehab   Ambulatory referral to Physical Therapy   No orders of the defined types were placed in this encounter.   Imaging: No results found.  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   Brain atrophy (HCC) 12/18/2022   Complete tear of right rotator cuff 05/16/2022   Adhesive capsulitis of right shoulder 03/14/2022   Influenza vaccine needed 10/31/2020   Need for diphtheria-tetanus-pertussis (Tdap) vaccine 10/31/2020   Hyperlipidemia associated with type 2 diabetes mellitus  (HCC) 10/31/2020   Overweight (BMI 25.0-29.9) 10/31/2020   Type 2 diabetes mellitus without complication, without long-term current use of insulin  (HCC) 07/31/2020   OSA (obstructive sleep apnea) 02/02/2020   Spondylosis without myelopathy or radiculopathy, lumbar region 09/17/2017   Essential hypertension 09/05/2017   Chronic bilateral low back pain with left-sided sciatica 09/05/2017   HTN (hypertension) 05/05/2017   Hypothyroidism 02/24/2017   Mixed hyperlipidemia 02/24/2017   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   Sleep related headaches 01/23/2017   Snoring 01/23/2017   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   Chronic headaches 10/30/2016   Obesity (BMI 30.0-34.9) 05/24/2016   PMB (postmenopausal bleeding) 05/24/2016   Back pain of lumbar region with sciatica 09/28/2015   Past Medical History:  Diagnosis Date   Anxiety    Arthritis    Depression    Diabetes mellitus without complication (HCC)    GERD (gastroesophageal reflux disease)    Hyperlipidemia    Hypertension    Hypothyroidism    Migraines    Thyroid  disease     Family History  Problem Relation Age of Onset   Headache Son    Colon cancer Neg Hx    Esophageal cancer Neg Hx    Rectal cancer Neg Hx    Stomach cancer Neg Hx    Breast cancer Neg Hx    Migraines Neg Hx     Past Surgical History:  Procedure Laterality Date  ANTERIOR CERVICAL DECOMP/DISCECTOMY FUSION N/A 03/11/2023   Procedure: C4-5, C5-6, C6-7 ANTERIOR CERVICAL DECOMPRESSION/DISCECTOMY FUSION 3 LEVELS;  Surgeon: Patricia Fowler, MD;  Location: [facility] OR;  Service: Orthopedics;  Laterality: N/A;   BICEPS TENDON REPAIR Right 01/29/2024   Procedure: REPAIR, TENDON, BICEPS, PROXIMAL;  Surgeon: Patricia Mesi, MD;  Location: [facility] OR;  Service: Orthopedics;  Laterality: Right;   BREAST CYST EXCISION Right 1980   BREAST CYST EXCISION Left 1985   EXAM UNDER ANESTHESIA WITH MANIPULATION OF SHOULDER Right 01/29/2024   Procedure: EXAM UNDER ANESTHESIA,  SHOULDER, WITH MANIPULATION;  Surgeon: Patricia Mesi, MD;  Location: [facility] OR;  Service: Orthopedics;  Laterality: Right;  RIGHT SHOULDER MUA with ARTHROSCOPC ROTATOR INTERVAL RELEASE AND POSSIBLE MINI OPEN BICEP TENODESIS   NO PAST SURGERIES     POSTERIOR LUMBAR FUSION 2 WITH HARDWARE REMOVAL Right 01/29/2024   Procedure: ARTHROSCOPY, SHOULDER WITH DEBRIDEMENT;  Surgeon: Patricia Mesi, MD;  Location: [facility] OR;  Service: Orthopedics;  Laterality: Right;  RIGHT SHOULDER MUA with ARTHROSCOPC ROTATOR INTERVAL RELEASE AND POSSIBLE MINI OPEN BICEP TENODESIS   Social History   Occupational History   Occupation: Unemployed  Tobacco Use   Smoking status: Never   Smokeless tobacco: Never  Vaping Use   Vaping status: Never Used  Substance and Sexual Activity   Alcohol use: No    Alcohol/week: 0.0 standard drinks of alcohol   Drug use: No   Sexual activity: Not Currently    Partners: Male

## 2024-03-16 ENCOUNTER — Ambulatory Visit: Payer: Medicaid Other | Admitting: Internal Medicine

## 2024-03-18 ENCOUNTER — Other Ambulatory Visit: Payer: Self-pay | Admitting: Orthopedic Surgery

## 2024-03-22 ENCOUNTER — Other Ambulatory Visit: Payer: Self-pay | Admitting: Neurology

## 2024-03-22 DIAGNOSIS — M5412 Radiculopathy, cervical region: Secondary | ICD-10-CM

## 2024-03-23 ENCOUNTER — Telehealth: Payer: Self-pay | Admitting: Neurology

## 2024-03-23 NOTE — Telephone Encounter (Signed)
 Referral for orthopedics surgery fax to [facility]. Phone: [phone], Fax: [phone]

## 2024-04-13 ENCOUNTER — Ambulatory Visit: Admitting: Physical Medicine and Rehabilitation

## 2024-04-14 ENCOUNTER — Telehealth: Payer: Self-pay

## 2024-04-14 ENCOUNTER — Other Ambulatory Visit: Payer: Self-pay | Admitting: Internal Medicine

## 2024-04-14 DIAGNOSIS — F330 Major depressive disorder, recurrent, mild: Secondary | ICD-10-CM | POA: Diagnosis not present

## 2024-04-14 DIAGNOSIS — R519 Headache, unspecified: Secondary | ICD-10-CM

## 2024-04-15 NOTE — Telephone Encounter (Signed)
 error

## 2024-04-16 ENCOUNTER — Encounter: Payer: Self-pay | Admitting: Orthopedic Surgery

## 2024-04-21 ENCOUNTER — Other Ambulatory Visit: Payer: Self-pay | Admitting: Internal Medicine

## 2024-04-21 DIAGNOSIS — E1159 Type 2 diabetes mellitus with other circulatory complications: Secondary | ICD-10-CM

## 2024-04-22 ENCOUNTER — Other Ambulatory Visit: Payer: Self-pay | Admitting: Surgical

## 2024-04-22 MED ORDER — MELOXICAM 15 MG PO TABS: 15.0000 mg | ORAL_TABLET | Freq: Every day | ORAL | 2 refills | Status: DC

## 2024-04-26 ENCOUNTER — Ambulatory Visit: Admitting: Orthopedic Surgery

## 2024-04-26 ENCOUNTER — Other Ambulatory Visit: Payer: Self-pay | Admitting: Neurology

## 2024-04-26 DIAGNOSIS — M5481 Occipital neuralgia: Secondary | ICD-10-CM

## 2024-04-26 DIAGNOSIS — M5412 Radiculopathy, cervical region: Secondary | ICD-10-CM

## 2024-04-26 DIAGNOSIS — G8929 Other chronic pain: Secondary | ICD-10-CM

## 2024-04-26 DIAGNOSIS — M25511 Pain in right shoulder: Secondary | ICD-10-CM

## 2024-04-27 DIAGNOSIS — F3181 Bipolar II disorder: Secondary | ICD-10-CM | POA: Diagnosis not present

## 2024-04-27 DIAGNOSIS — F413 Other mixed anxiety disorders: Secondary | ICD-10-CM | POA: Diagnosis not present

## 2024-04-28 ENCOUNTER — Encounter: Payer: Self-pay | Admitting: Orthopedic Surgery

## 2024-04-28 NOTE — Progress Notes (Signed)
 Post-Op Visit Note   Patient: [name]           Date of Birth: Feb 15, 1961           MRN: [id number] Visit Date: 04/26/2024 PCP: Vicci Barnie NOVAK, MD   Assessment & Plan:  Chief Complaint:  Chief Complaint  Patient presents with   Right Shoulder - Follow-up    01/29/24 right shoulder MUA   Visit Diagnoses: No diagnosis found.  Plan: Ltanya underwent right shoulder manipulation under anesthesia 1425.  She has a shoulder pulley.  3 months out.  Taking gabapentin  and Flexeril .  On examination range of motion is 45/85/110.  Strength is good to infraspinatus supraspinatus and subscap muscle testing.  Plan at this time is she is doing well following her shoulder manipulation.  She does have known cervical spine pathology at C2-3.  Would like for her to see Dr. Alray for evaluation for possible cervical spine epidural steroid injections.  Follow-up at that time.  We will see her back as needed for her shoulder.  Follow-Up Instructions: No follow-ups on file.   Orders:  No orders of the defined types were placed in this encounter.  No orders of the defined types were placed in this encounter.   Imaging: No results found.  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   Brain atrophy (HCC) 12/18/2022   Complete tear of right rotator cuff 05/16/2022   Adhesive capsulitis of right shoulder 03/14/2022   Influenza vaccine needed 10/31/2020   Need for diphtheria-tetanus-pertussis (Tdap) vaccine 10/31/2020   Hyperlipidemia associated with type 2 diabetes mellitus (HCC) 10/31/2020   Overweight (BMI 25.0-29.9) 10/31/2020   Type 2 diabetes mellitus without complication, without long-term current use of insulin  (HCC) 07/31/2020   OSA (obstructive  sleep apnea) 02/02/2020   Spondylosis without myelopathy or radiculopathy, lumbar region 09/17/2017   Essential hypertension 09/05/2017   Chronic bilateral low back pain with left-sided sciatica 09/05/2017   HTN (hypertension) 05/05/2017   Hypothyroidism 02/24/2017   Mixed hyperlipidemia 02/24/2017   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   Sleep related headaches 01/23/2017   Snoring 01/23/2017   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   Chronic headaches 10/30/2016   Obesity (BMI 30.0-34.9) 05/24/2016   PMB (postmenopausal bleeding) 05/24/2016   Back pain of lumbar region with sciatica 09/28/2015   Past Medical History:  Diagnosis Date   Anxiety    Arthritis    Depression    Diabetes mellitus without complication (HCC)    GERD (gastroesophageal reflux disease)    Hyperlipidemia    Hypertension    Hypothyroidism    Migraines    Thyroid  disease     Family History  Problem Relation Age of Onset   Headache Son    Colon cancer Neg Hx    Esophageal cancer Neg Hx    Rectal cancer Neg Hx    Stomach cancer Neg Hx    Breast cancer Neg Hx    Migraines Neg Hx     Past Surgical History:  Procedure Laterality Date   ANTERIOR CERVICAL DECOMP/DISCECTOMY FUSION N/A 03/11/2023   Procedure: C4-5, C5-6, C6-7 ANTERIOR CERVICAL DECOMPRESSION/DISCECTOMY FUSION 3 LEVELS;  Surgeon: Georgina Ozell LABOR, MD;  Location: [facility] OR;  Service: Orthopedics;  Laterality: N/A;   BICEPS TENDON REPAIR Right 01/29/2024   Procedure:  REPAIR, TENDON, BICEPS, PROXIMAL;  Surgeon: Addie Cordella Hamilton, MD;  Location: [facility] OR;  Service: Orthopedics;  Laterality: Right;   BREAST CYST EXCISION Right 1980   BREAST CYST EXCISION Left 1985   EXAM UNDER ANESTHESIA WITH MANIPULATION OF SHOULDER Right 01/29/2024   Procedure: EXAM UNDER ANESTHESIA, SHOULDER, WITH MANIPULATION;  Surgeon: Addie Cordella Hamilton, MD;  Location: [facility] OR;  Service: Orthopedics;  Laterality: Right;  RIGHT SHOULDER MUA with ARTHROSCOPC ROTATOR INTERVAL RELEASE  AND POSSIBLE MINI OPEN BICEP TENODESIS   NO PAST SURGERIES     POSTERIOR LUMBAR FUSION 2 WITH HARDWARE REMOVAL Right 01/29/2024   Procedure: ARTHROSCOPY, SHOULDER WITH DEBRIDEMENT;  Surgeon: Addie Cordella Hamilton, MD;  Location: [facility] OR;  Service: Orthopedics;  Laterality: Right;  RIGHT SHOULDER MUA with ARTHROSCOPC ROTATOR INTERVAL RELEASE AND POSSIBLE MINI OPEN BICEP TENODESIS   Social History   Occupational History   Occupation: Unemployed  Tobacco Use   Smoking status: Never   Smokeless tobacco: Never  Vaping Use   Vaping status: Never Used  Substance and Sexual Activity   Alcohol use: No    Alcohol/week: 0.0 standard drinks of alcohol   Drug use: No   Sexual activity: Not Currently    Partners: Male

## 2024-04-29 ENCOUNTER — Ambulatory Visit: Admitting: Physical Medicine and Rehabilitation

## 2024-04-29 ENCOUNTER — Encounter: Payer: Self-pay | Admitting: Physical Medicine and Rehabilitation

## 2024-04-29 ENCOUNTER — Other Ambulatory Visit: Payer: Self-pay | Admitting: Internal Medicine

## 2024-04-29 DIAGNOSIS — M5481 Occipital neuralgia: Secondary | ICD-10-CM

## 2024-04-29 DIAGNOSIS — M542 Cervicalgia: Secondary | ICD-10-CM | POA: Diagnosis not present

## 2024-04-29 DIAGNOSIS — M7918 Myalgia, other site: Secondary | ICD-10-CM

## 2024-04-29 DIAGNOSIS — E1159 Type 2 diabetes mellitus with other circulatory complications: Secondary | ICD-10-CM

## 2024-04-29 NOTE — Progress Notes (Signed)
 [name] - 63 y.o. female MRN [id number]  Date of birth: 01/13/1961  Office Visit Note: Visit Date: 04/29/2024 PCP: Vicci Barnie NOVAK, MD Referred by: Vicci Barnie NOVAK, MD  Subjective: Chief Complaint  Patient presents with   Neck - Pain   HPI: [name] is a 63 y.o. female who comes in today per the request of Dr. Onetha Epp for evaluation of chronic, worsening and severe bilateral neck pain radiating up to head. Also reports associated headaches. She was referred to us  for evaluation of possible third occipital nerve blocks. Pain ongoing for over a year, most severe at night. We have seen her in the office for more lower back issues in the past. Her pain worsens with movement and activity. She describes pain as tight and sore sensation, currently rates as 7 out of 10. No radicular pain down the arms.  She has tried various medications such as Depakote  and Gabapentin  without relief of symptoms.  Cervical MRI imaging from 2024 shows broad left paracentral disc protrusion contacting the left ventral paracentral cervical spinal cord at C4-C5, there is severe bilateral foraminal stenosis at C5-C6 and mild to moderate foraminal stenosis at C6-C7. She underwent C4-C5, C5-C6 and C6-C7 ACDF with Dr. Georgina on 03/11/2023. She has done well post surgery. Patient denies focal weakness, numbness and tingling. No recent trauma or falls.       Review of Systems  Musculoskeletal:  Positive for myalgias and neck pain.  Neurological:  Positive for headaches. Negative for focal weakness and weakness.  All other systems reviewed and are negative.  Otherwise per HPI.  Assessment & Plan: Visit Diagnoses:    ICD-10-CM   1. Cervicalgia  M54.2     2. Bilateral occipital neuralgia  M54.81     3. Myofascial pain syndrome  M79.18        Plan: Findings:  Chronic, worsening and severe bilateral neck pain radiating up to the head, chronic associated headaches.  Patient continues to have  severe pain despite good conservative therapy such as home exercise regimen, rest and use of medications.  Patient's clinical presentation and exam are consistent with occipital neuralgia. We discussed treatment plan in detail today. Next step is to place order for bilateral third occipital nerve blocks (C2-C3 facet joint injections) under fluroscopic guidance. If good relief of third occipital nerve blocks would plan for radiofrequency ablation. I discussed injection procedure with patient in detail today, she has no questions at this time. We will see her back for injections, can follow up with Dr. Epp as needed. No red flag symptoms noted upon exam today.     Meds & Orders: No orders of the defined types were placed in this encounter.  No orders of the defined types were placed in this encounter.   Follow-up: No follow-ups on file.   Procedures: No procedures performed      Clinical History: MRI CERVICAL SPINE WITHOUT CONTRAST   TECHNIQUE: Multiplanar, multisequence MR imaging of the cervical spine was performed. No intravenous contrast was administered.   COMPARISON:  None Available.   FINDINGS: Alignment: Physiologic.   Vertebrae: No acute fracture, evidence of discitis, or aggressive bone lesion.   Cord: Normal signal and morphology.   Posterior Fossa, vertebral arteries, paraspinal tissues: Posterior fossa demonstrates no focal abnormality. Vertebral artery flow voids are maintained. Paraspinal soft tissues are unremarkable.   Disc levels:   Discs: Degenerative disease with disc height loss at C4-5, C5-6 and C6-7.   C2-3: No  disc protrusion. Mild right facet arthropathy. Moderate-severe right foraminal stenosis. No left foraminal stenosis. No spinal stenosis.   C3-4: Mild broad-based disc bulge. Moderate left and mild right facet arthropathy. Moderate-severe left foraminal stenosis. Mild right foraminal stenosis. No spinal stenosis.   C4-5: Broad left  paracentral disc protrusion contacting the left ventral paracentral cervical spinal cord. Moderate left foraminal stenosis. Mild right foraminal stenosis. Mild spinal stenosis.   C5-6: Mild broad-based disc bulge. Bilateral uncovertebral degenerative changes. Severe bilateral foraminal stenosis. Mild spinal stenosis.   C6-7: Broad-based disc bulge. Mild-moderate left foraminal stenosis. Mild right foraminal stenosis. No spinal stenosis.   C7-T1: Small central disc protrusion. No foraminal or central canal stenosis.   IMPRESSION: 1. Cervical spine spondylosis as described above. 2. No acute osseous injury of the cervical spine.     Electronically Signed   By: Julaine Blanch M.D.   On: 12/25/2022 09:13   She reports that she has never smoked. She has never used smokeless tobacco.  Recent Labs    07/11/23 1608 11/17/23 1642 01/27/24 0844  HGBA1C 6.5 6.6 5.5    Objective:  VS:  HT:    WT:   BMI:     BP:   HR: bpm  TEMP: ( )  RESP:  Physical Exam Vitals and nursing note reviewed.  HENT:     Head: Normocephalic and atraumatic.     Right Ear: External ear normal.     Left Ear: External ear normal.     Nose: Nose normal.     Mouth/Throat:     Mouth: Mucous membranes are moist.  Eyes:     Extraocular Movements: Extraocular movements intact.  Cardiovascular:     Rate and Rhythm: Normal rate.     Pulses: Normal pulses.  Pulmonary:     Effort: Pulmonary effort is normal.  Abdominal:     General: Abdomen is flat. There is no distension.  Musculoskeletal:        General: Tenderness present.     Cervical back: Tenderness present.     Comments: Discomfort noted with flexion, extension and side-to-side rotation. Patient has good strength in the upper extremities including 5 out of 5 strength in wrist extension, long finger flexion and APB. Shoulder range of motion is full bilaterally without any sign of impingement. There is no atrophy of the hands intrinsically. Sensation  intact bilaterally. Myofascial tenderness noted to bilateral levator scapulae regions upon palpation today. Negative Hoffman's sign. Negative Spurling's sign.     Skin:    General: Skin is warm and dry.     Capillary Refill: Capillary refill takes less than 2 seconds.  Neurological:     General: No focal deficit present.     Mental Status: She is alert and oriented to person, place, and time.  Psychiatric:        Mood and Affect: Mood normal.        Behavior: Behavior normal.     Ortho Exam  Imaging: No results found.  Past Medical/Family/Surgical/Social History: Medications & Allergies reviewed per EMR, new medications updated. 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   Brain atrophy (HCC) 12/18/2022   Complete tear of right rotator cuff 05/16/2022   Adhesive capsulitis of right shoulder 03/14/2022   Influenza vaccine needed 10/31/2020   Need for diphtheria-tetanus-pertussis (Tdap) vaccine  10/31/2020   Hyperlipidemia associated with type 2 diabetes mellitus (HCC) 10/31/2020   Overweight (BMI 25.0-29.9) 10/31/2020   Type 2 diabetes mellitus without complication, without long-term current use of insulin  (HCC) 07/31/2020   OSA (obstructive sleep apnea) 02/02/2020   Spondylosis without myelopathy or radiculopathy, lumbar region 09/17/2017   Essential hypertension 09/05/2017   Chronic bilateral low back pain with left-sided sciatica 09/05/2017   HTN (hypertension) 05/05/2017   Hypothyroidism 02/24/2017   Mixed hyperlipidemia 02/24/2017   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   Sleep related headaches 01/23/2017   Snoring 01/23/2017   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   Chronic headaches 10/30/2016   Obesity (BMI 30.0-34.9) 05/24/2016   PMB (postmenopausal  bleeding) 05/24/2016   Back pain of lumbar region with sciatica 09/28/2015   Past Medical History:  Diagnosis Date   Anxiety    Arthritis    Depression    Diabetes mellitus without complication (HCC)    GERD (gastroesophageal reflux disease)    Hyperlipidemia    Hypertension    Hypothyroidism    Migraines    Thyroid  disease    Family History  Problem Relation Age of Onset   Headache Son    Colon cancer Neg Hx    Esophageal cancer Neg Hx    Rectal cancer Neg Hx    Stomach cancer Neg Hx    Breast cancer Neg Hx    Migraines Neg Hx    Past Surgical History:  Procedure Laterality Date   ANTERIOR CERVICAL DECOMP/DISCECTOMY FUSION N/A 03/11/2023   Procedure: C4-5, C5-6, C6-7 ANTERIOR CERVICAL DECOMPRESSION/DISCECTOMY FUSION 3 LEVELS;  Surgeon: Georgina Ozell LABOR, MD;  Location: [facility] OR;  Service: Orthopedics;  Laterality: N/A;   BICEPS TENDON REPAIR Right 01/29/2024   Procedure: REPAIR, TENDON, BICEPS, PROXIMAL;  Surgeon: Addie Cordella Hamilton, MD;  Location: [facility] OR;  Service: Orthopedics;  Laterality: Right;   BREAST CYST EXCISION Right 1980   BREAST CYST EXCISION Left 1985   EXAM UNDER ANESTHESIA WITH MANIPULATION OF SHOULDER Right 01/29/2024   Procedure: EXAM UNDER ANESTHESIA, SHOULDER, WITH MANIPULATION;  Surgeon: Addie Cordella Hamilton, MD;  Location: [facility] OR;  Service: Orthopedics;  Laterality: Right;  RIGHT SHOULDER MUA with ARTHROSCOPC ROTATOR INTERVAL RELEASE AND POSSIBLE MINI OPEN BICEP TENODESIS   NO PAST SURGERIES     POSTERIOR LUMBAR FUSION 2 WITH HARDWARE REMOVAL Right 01/29/2024   Procedure: ARTHROSCOPY, SHOULDER WITH DEBRIDEMENT;  Surgeon: Addie Cordella Hamilton, MD;  Location: [facility] OR;  Service: Orthopedics;  Laterality: Right;  RIGHT SHOULDER MUA with ARTHROSCOPC ROTATOR INTERVAL RELEASE AND POSSIBLE MINI OPEN BICEP TENODESIS   Social History   Occupational History   Occupation: Unemployed  Tobacco Use   Smoking status: Never   Smokeless tobacco: Never  Vaping Use   Vaping  status: Never Used  Substance and Sexual Activity   Alcohol use: No    Alcohol/week: 0.0 standard drinks of alcohol   Drug use: No   Sexual activity: Not Currently    Partners: Male

## 2024-04-29 NOTE — Progress Notes (Signed)
 Pain Scale   Average Pain 7 Patient advising she has chronic neck pain that gives her headaches. Patient states she never get relief.        +Driver, -BT, -Dye Allergies.

## 2024-05-01 ENCOUNTER — Encounter: Payer: Self-pay | Admitting: Orthopedic Surgery

## 2024-05-03 ENCOUNTER — Other Ambulatory Visit: Payer: Self-pay | Admitting: Orthopedic Surgery

## 2024-05-03 ENCOUNTER — Other Ambulatory Visit: Payer: Self-pay | Admitting: Internal Medicine

## 2024-05-03 DIAGNOSIS — K219 Gastro-esophageal reflux disease without esophagitis: Secondary | ICD-10-CM

## 2024-05-04 ENCOUNTER — Encounter: Payer: Self-pay | Admitting: Internal Medicine

## 2024-05-04 ENCOUNTER — Other Ambulatory Visit: Payer: Self-pay | Admitting: *Deleted

## 2024-05-04 DIAGNOSIS — K219 Gastro-esophageal reflux disease without esophagitis: Secondary | ICD-10-CM

## 2024-05-04 MED ORDER — OMEPRAZOLE 20 MG PO CPDR: 20.0000 mg | DELAYED_RELEASE_CAPSULE | Freq: Every day | ORAL | 0 refills | Status: DC

## 2024-05-05 DIAGNOSIS — F330 Major depressive disorder, recurrent, mild: Secondary | ICD-10-CM | POA: Diagnosis not present

## 2024-05-07 ENCOUNTER — Telehealth: Payer: Self-pay | Admitting: Internal Medicine

## 2024-05-07 NOTE — Telephone Encounter (Addendum)
 Called patient, no answer. Left voicemail confirming upcoming appointment on 05/11/2024 with Dr.Johnson at 2:30 pm. Provided callback number for any questions or changes.

## 2024-05-11 ENCOUNTER — Ambulatory Visit: Attending: Internal Medicine | Admitting: Internal Medicine

## 2024-05-11 ENCOUNTER — Telehealth: Payer: Self-pay | Admitting: Internal Medicine

## 2024-05-11 ENCOUNTER — Encounter: Payer: Self-pay | Admitting: Internal Medicine

## 2024-05-11 VITALS — BP 112/78 | HR 72 | Ht 67.0 in | Wt 191.0 lb

## 2024-05-11 DIAGNOSIS — Z7985 Long-term (current) use of injectable non-insulin antidiabetic drugs: Secondary | ICD-10-CM

## 2024-05-11 DIAGNOSIS — E039 Hypothyroidism, unspecified: Secondary | ICD-10-CM | POA: Diagnosis not present

## 2024-05-11 DIAGNOSIS — L853 Xerosis cutis: Secondary | ICD-10-CM

## 2024-05-11 DIAGNOSIS — E1169 Type 2 diabetes mellitus with other specified complication: Secondary | ICD-10-CM | POA: Diagnosis not present

## 2024-05-11 DIAGNOSIS — L989 Disorder of the skin and subcutaneous tissue, unspecified: Secondary | ICD-10-CM

## 2024-05-11 DIAGNOSIS — E785 Hyperlipidemia, unspecified: Secondary | ICD-10-CM | POA: Diagnosis not present

## 2024-05-11 DIAGNOSIS — I10 Essential (primary) hypertension: Secondary | ICD-10-CM

## 2024-05-11 DIAGNOSIS — E1165 Type 2 diabetes mellitus with hyperglycemia: Secondary | ICD-10-CM | POA: Diagnosis not present

## 2024-05-11 DIAGNOSIS — I152 Hypertension secondary to endocrine disorders: Secondary | ICD-10-CM

## 2024-05-11 DIAGNOSIS — Z1211 Encounter for screening for malignant neoplasm of colon: Secondary | ICD-10-CM

## 2024-05-11 LAB — POCT GLYCOSYLATED HEMOGLOBIN (HGB A1C): HbA1c, POC (controlled diabetic range): 6.9 % (ref 0.0–7.0)

## 2024-05-11 LAB — GLUCOSE, POCT (MANUAL RESULT ENTRY): POC Glucose: 112 mg/dL — AB (ref 70–99)

## 2024-05-11 MED ORDER — TRULICITY 3 MG/0.5ML ~~LOC~~ SOAJ: 3.0000 mg | SUBCUTANEOUS | 6 refills | Status: AC

## 2024-05-11 MED ORDER — AMLODIPINE BESYLATE 10 MG PO TABS: 10.0000 mg | ORAL_TABLET | Freq: Every day | ORAL | 1 refills | Status: AC

## 2024-05-11 NOTE — Progress Notes (Signed)
 Patient ID: [name], female    DOB: 13-Mar-1961  MRN: [id number]  CC: Diabetes (DM f/u. Med refills. /No questions/ concerns/Yes to stool kit )   Subjective: [name] is a 63 y.o. female who presents for chronic ds management. Her concerns today include:  HTN, HL, depression, hypothyroid, DM, obesity, tension HA, chronic LBP (minimal degen disc ds on MRI).    Discussed the use of AI scribe software for clinical note transcription with the patient, who gave verbal consent to proceed.  History of Present Illness [name] is a 63 year old female who presents for a follow-up visit.  She underwent surgery for adhesive capsulitis of the right shoulder in April and has completed physical therapy.  DM: Results for orders placed or performed in visit on 05/11/24  POCT glucose (manual entry)   Collection Time: 05/11/24  2:45 PM  Result Value Ref Range   POC Glucose 112 (A) 70 - 99 mg/dl  POCT glycosylated hemoglobin (Hb A1C)   Collection Time: 05/11/24  3:16 PM  Result Value Ref Range   Hemoglobin A1C     HbA1c POC (<> result, manual entry)     HbA1c, POC (prediabetic range)     HbA1c, POC (controlled diabetic range) 6.9 0.0 - 7.0 %  She continues to take Trulicity  1.5 mg once a week for diabetes management. Her A1c has increased from 5.5% in April to 6.9% today. She has not been checking her blood sugars regularly and notes an increase in appetite, leading to weight gain from 187 lbs in January to 191 lbs today. She attributes this to increased snacking on processed foods like pretzels, sodas, and cookies. Healthier snacks like fruits or nuts are not purchased as her son does the shopping. She engages in some physical activity, walking with her son, but notes the hot weather has limited her activity recently.  Hypertension: she is on amlodipine  10 mg, lisinopril  20 mg daily, and Inderal  80 mg twice a day. She does not check her blood pressure regularly but limits her salt  intake. Other blood pressure readings in the system since last visit with me are 112/80 in May and 119/63 in April.  HL: She takes atorvastatin  80 mg daily for hyperlipidemia  Hypothyroid: On levothyroxine  daily for hypothyroidism. Her last thyroid  level check was in September of the previous year.  She mentions being prescribed gabapentin  and meloxicam  by her shoulder doctor and inquires about their effects on her blood pressure and blood sugar.  She reports a lesion by her left nostril present for a couple of months, which has not increased in size. She believes it started as a zit  HM: She is due for a colon cancer screening and prefers the stool kit method. She has an upcoming diabetic eye exam scheduled with Dr. Alean at [facility] on October 10th.  .      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   Brain atrophy (HCC) 12/18/2022   Complete tear of right rotator cuff 05/16/2022   Adhesive capsulitis of right shoulder 03/14/2022   Influenza vaccine needed 10/31/2020   Need for diphtheria-tetanus-pertussis (Tdap) vaccine 10/31/2020   Hyperlipidemia associated with type 2 diabetes mellitus (HCC) 10/31/2020   Overweight (BMI 25.0-29.9) 10/31/2020   Type 2 diabetes mellitus without complication,  without long-term current use of insulin  (HCC) 07/31/2020   OSA (obstructive sleep apnea) 02/02/2020   Spondylosis without myelopathy or radiculopathy, lumbar region 09/17/2017   Essential hypertension 09/05/2017   Chronic bilateral low back pain with left-sided sciatica 09/05/2017   HTN (hypertension) 05/05/2017   Hypothyroidism 02/24/2017   Mixed hyperlipidemia 02/24/2017   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   Sleep related headaches 01/23/2017   Snoring 01/23/2017    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   Chronic headaches 10/30/2016   Obesity (BMI 30.0-34.9) 05/24/2016   PMB (postmenopausal bleeding) 05/24/2016   Back pain of lumbar region with sciatica 09/28/2015     Current Outpatient Medications on File Prior to Visit  Medication Sig Dispense Refill   Accu-Chek FastClix Lancets MISC USE AS DIRECTED TO CHECK BLOOD SUGAR THREE TIMES DAILY 102 each 6   ACCU-CHEK GUIDE TEST test strip USE TO TEST THREE TIMES DAILY 100 strip 6   Accu-Chek Softclix Lancets lancets Use as instructed to check blood sugar three times daily 100 each 6   acetaminophen  (TYLENOL ) 500 MG tablet Take 1,000 mg by mouth daily as needed for mild pain (pain score 1-3) or moderate pain (pain score 4-6).     amitriptyline  (ELAVIL ) 50 MG tablet Take 1 tablet (50 mg total) by mouth at bedtime. (Patient taking differently: Take 50 mg by mouth at bedtime as needed for sleep.) 90 tablet 1   atorvastatin  (LIPITOR) 80 MG tablet TAKE ONE TABLET BY MOUTH EVERY DAY 90 tablet 1   busPIRone  (BUSPAR ) 10 MG tablet Take 10 mg by mouth daily.     cyclobenzaprine  (FLEXERIL ) 10 MG tablet TAKE ONE TABLET BY MOUTH TWICE DAILY AS NEEDED FOR muscla FOR PAIN AND SPASMS 60 tablet 1   DULoxetine  (CYMBALTA ) 30 MG capsule Take 1 capsule (30 mg total) by mouth 2 (two) times daily. (Patient taking differently: Take 30 mg by mouth daily.) 180 capsule 1   gabapentin  (NEURONTIN ) 300 MG capsule Take 1 capsule (300 mg total) by mouth at bedtime. 90 capsule 4   levothyroxine  (SYNTHROID ) 50 MCG tablet TAKE ONE TABLET BY MOUTH EVERY MORNING 90 tablet 1   lisinopril  (ZESTRIL ) 20 MG tablet Take 1 tablet (20 mg total) by mouth daily. 90 tablet 1   lurasidone (LATUDA) 20 MG TABS tablet Take 20 mg by mouth at bedtime.     meloxicam  (MOBIC ) 15 MG tablet Take 1 tablet (15 mg total) by mouth daily. Do not take with other NSAIDs such as Celebrex  or Ibuprofen  30 tablet 2   omeprazole  (PRILOSEC) 20 MG capsule Take 1 capsule  (20 mg total) by mouth daily. 90 capsule 0   oxyCODONE  (ROXICODONE ) 5 MG immediate release tablet Take 1 tablet (5 mg total) by mouth every 4 (four) hours as needed for severe pain (pain score 7-10). 30 tablet 0   propranolol  (INDERAL ) 80 MG tablet Take 1 tablet (80 mg total) by mouth 2 (two) times daily. (Patient taking differently: Take 80 mg by mouth 3 (three) times daily.) 180 tablet 0   No current facility-administered medications on file prior to visit.    Allergies  Allergen Reactions   Iron Diarrhea   Metformin  And Related Nausea And Vomiting   Penicillins Nausea And Vomiting    Social History   Socioeconomic History   Marital status: Single    Spouse name: Not on file   Number of children: 1   Years of education: 66   Highest education level: 12th grade  Occupational History  Occupation: Unemployed  Tobacco Use   Smoking status: Never   Smokeless tobacco: Never  Vaping Use   Vaping status: Never Used  Substance and Sexual Activity   Alcohol use: No    Alcohol/week: 0.0 standard drinks of alcohol   Drug use: No   Sexual activity: Not Currently    Partners: Male  Other Topics Concern   Not on file  Social History Narrative   Lives with son, Alm.   Caffeine use: Daily       Left handed   Retired    Teacher, early years/pre Strain: Low Risk  (05/11/2024)   Overall Financial Resource Strain (CARDIA)    Difficulty of Paying Living Expenses: Not very hard  Food Insecurity: No Food Insecurity (05/11/2024)   Hunger Vital Sign    Worried About Running Out of Food in the Last Year: Never true    Ran Out of Food in the Last Year: Never true  Transportation Needs: No Transportation Needs (05/11/2024)   PRAPARE - Administrator, Civil Service (Medical): No    Lack of Transportation (Non-Medical): No  Physical Activity: Insufficiently Active (05/11/2024)   Exercise Vital Sign    Days of Exercise per Week: 1 day    Minutes of  Exercise per Session: 120 min  Stress: Stress Concern Present (05/11/2024)   [employer] of Occupational Health - Occupational Stress Questionnaire    Feeling of Stress: Rather much  Social Connections: Socially Isolated (05/11/2024)   Social Connection and Isolation Panel    Frequency of Communication with Friends and Family: Never    Frequency of Social Gatherings with Friends and Family: Once a week    Attends Religious Services: Never    Database administrator or Organizations: Yes    Attends Banker Meetings: Never    Marital Status: Never married  Intimate Partner Violence: Not At Risk (05/11/2024)   Humiliation, Afraid, Rape, and Kick questionnaire    Fear of Current or Ex-Partner: No    Emotionally Abused: No    Physically Abused: No    Sexually Abused: No    Family History  Problem Relation Age of Onset   Headache Son    Colon cancer Neg Hx    Esophageal cancer Neg Hx    Rectal cancer Neg Hx    Stomach cancer Neg Hx    Breast cancer Neg Hx    Migraines Neg Hx     Past Surgical History:  Procedure Laterality Date   ANTERIOR CERVICAL DECOMP/DISCECTOMY FUSION N/A 03/11/2023   Procedure: C4-5, C5-6, C6-7 ANTERIOR CERVICAL DECOMPRESSION/DISCECTOMY FUSION 3 LEVELS;  Surgeon: Georgina Ozell LABOR, MD;  Location: [facility] OR;  Service: Orthopedics;  Laterality: N/A;   BICEPS TENDON REPAIR Right 01/29/2024   Procedure: REPAIR, TENDON, BICEPS, PROXIMAL;  Surgeon: Addie Cordella Hamilton, MD;  Location: [facility] OR;  Service: Orthopedics;  Laterality: Right;   BREAST CYST EXCISION Right 1980   BREAST CYST EXCISION Left 1985   EXAM UNDER ANESTHESIA WITH MANIPULATION OF SHOULDER Right 01/29/2024   Procedure: EXAM UNDER ANESTHESIA, SHOULDER, WITH MANIPULATION;  Surgeon: Addie Cordella Hamilton, MD;  Location: [facility] OR;  Service: Orthopedics;  Laterality: Right;  RIGHT SHOULDER MUA with ARTHROSCOPC ROTATOR INTERVAL RELEASE AND POSSIBLE MINI OPEN BICEP TENODESIS   NO PAST SURGERIES     POSTERIOR  LUMBAR FUSION 2 WITH HARDWARE REMOVAL Right 01/29/2024   Procedure: ARTHROSCOPY, SHOULDER WITH DEBRIDEMENT;  Surgeon: Addie Cordella Hamilton, MD;  Location: [facility]  OR;  Service: Orthopedics;  Laterality: Right;  RIGHT SHOULDER MUA with ARTHROSCOPC ROTATOR INTERVAL RELEASE AND POSSIBLE MINI OPEN BICEP TENODESIS    ROS: Review of Systems Negative except as stated above  PHYSICAL EXAM: BP 112/78   Pulse 72   Ht 5' 7 (1.702 m)   Wt 191 lb (86.6 kg)   LMP 06/06/2015   SpO2 97%   BMI 29.91 kg/m   Wt Readings from Last 3 Encounters:  05/11/24 191 lb (86.6 kg)  03/09/24 184 lb (83.5 kg)  01/29/24 187 lb (84.8 kg)    Physical Exam  General appearance - alert, well appearing, and in no distress Mental status - normal mood, behavior, speech, dress, motor activity, and thought processes Neck - supple, no significant adenopathy Chest - clear to auscultation, no wheezes, rales or rhonchi, symmetric air entry Heart - normal rate, regular rhythm, normal S1, S2, no murmurs, rubs, clicks or gallops Extremities - peripheral pulses normal, no pedal edema, no clubbing or cyanosis Skin: She has a pearly round raised nodule just below the entrance of the left nostril.  It is about half a centimeter in size.  See picture below. Diabetic Foot Exam - Simple   Simple Foot Form Diabetic Foot exam was performed with the following findings: Yes 05/11/2024  3:37 PM  Visual Inspection See comments: Yes Sensation Testing Intact to touch and monofilament testing bilaterally: Yes Pulse Check Posterior Tibialis and Dorsalis pulse intact bilaterally: Yes Comments Toenails are thick, discolored and overgrown.  Skin on plantar surface very dry and scaly.  Also very dry and scaly on the dorsal surface of the toes.         Latest Ref Rng & Units 01/27/2024    8:32 AM 07/11/2023    4:54 PM 03/12/2023    6:33 AM  CMP  Glucose 70 - 99 mg/dL 871  878  793   BUN 8 - 23 mg/dL 17  15  14    Creatinine 0.44 - 1.00 mg/dL  9.12  9.14  9.13   Sodium 135 - 145 mmol/L 138  138  134   Potassium 3.5 - 5.1 mmol/L 4.2  4.8  4.2   Chloride 98 - 111 mmol/L 102  102  101   CO2 22 - 32 mmol/L 25  20  22    Calcium  8.9 - 10.3 mg/dL 9.0  9.5  8.7   Total Protein 6.0 - 8.5 g/dL  8.1    Total Bilirubin 0.0 - 1.2 mg/dL  0.6    Alkaline Phos 44 - 121 IU/L  119    AST 0 - 40 IU/L  24    ALT 0 - 32 IU/L  46     Lipid Panel     Component Value Date/Time   CHOL 190 07/11/2023 1654   TRIG 138 07/11/2023 1654   HDL 42 07/11/2023 1654   CHOLHDL 4.5 (H) 07/11/2023 1654   CHOLHDL 9.2 (H) 09/28/2015 1420   VLDL 61 (H) 09/28/2015 1420   LDLCALC 123 (H) 07/11/2023 1654    CBC    Component Value Date/Time   WBC 6.7 01/27/2024 0832   RBC 4.63 01/27/2024 0832   HGB 15.0 01/27/2024 0832   HGB 15.1 02/13/2023 1548   HCT 43.7 01/27/2024 0832   HCT 43.8 02/13/2023 1548   PLT 303 01/27/2024 0832   PLT 302 08/28/2021 0931   MCV 94.4 01/27/2024 0832   MCV 93 02/13/2023 1548   MCH 32.4 01/27/2024 0832   MCHC 34.3 01/27/2024  9167   RDW 12.9 01/27/2024 0832   RDW 13.5 02/13/2023 1548   LYMPHSABS 3.7 (H) 02/13/2023 1548   MONOABS 0.9 12/30/2022 1635   EOSABS 0.1 02/13/2023 1548   BASOSABS 0.1 02/13/2023 1548    ASSESSMENT AND PLAN: 1. Type 2 diabetes mellitus with hyperlipidemia (HCC) (Primary) A1c is at goal.  However she has had increased weight and increased appetite preferring to snack on sugary foods.  Dietary counseling given.  Encouraged her to purchase healthy snacks like fruits or nuts.  Discussed increasing Trulicity  to 3 mg once a week.  She still has about 5 boxes of the 1.5 mg left.  I told her to start taking 2 of the 1.5 mg injections once a week until she runs out at which time she will start the 3 mg pen. - POCT glycosylated hemoglobin (Hb A1C) - POCT glucose (manual entry) - Dulaglutide  (TRULICITY ) 3 MG/0.5ML SOAJ; Inject 3 mg as directed once a week. Double up on your current 1.5 mg pens until you run out.   Dispense: 2 mL; Refill: 6 - Hepatic Function Panel  2. Long-term (current) use of injectable non-insulin  antidiabetic drugs See #1 above  3. Hypertension associated with type 2 diabetes mellitus (HCC) At goal.  Continue Norvasc  10 mg daily, lisinopril  10 mg daily and Inderal  80 mg twice a day. - amLODipine  (NORVASC ) 10 MG tablet; Take 1 tablet (10 mg total) by mouth daily.  Dispense: 90 tablet; Refill: 1  4. Hypothyroidism (acquired) Continue levothyroxine . - TSH  5. Lesion of skin of face Looks like this may be basal cell.  Refer to dermatology. - Ambulatory referral to Dermatology  6. Screening for colon cancer - Fecal occult blood, imunochemical([facility])  7. Dry skin on feet Advised to use lotion like Eucerin intensive care or Aveeno on feet after taking showers/baths   Patient was given the opportunity to ask questions.  Patient verbalized understanding of the plan and was able to repeat key elements of the plan.   This documentation was completed using Paediatric nurse.  Any transcriptional errors are unintentional.  Orders Placed This Encounter  Procedures   Fecal occult blood, imunochemical([facility]/Sun[facility])   Hepatic Function Panel   TSH   Ambulatory referral to Dermatology   POCT glycosylated hemoglobin (Hb A1C)   POCT glucose (manual entry)     Requested Prescriptions   Signed Prescriptions Disp Refills   amLODipine  (NORVASC ) 10 MG tablet 90 tablet 1    Sig: Take 1 tablet (10 mg total) by mouth daily.   Dulaglutide  (TRULICITY ) 3 MG/0.5ML SOAJ 2 mL 6    Sig: Inject 3 mg as directed once a week. Double up on your current 1.5 mg pens until you run out.    Return in about 4 months (around 09/11/2024).  Barnie Louder, MD, FACP

## 2024-05-11 NOTE — Telephone Encounter (Signed)
 Transportation Voucher completed.   Pick up date and time: 05/11/2024 3:50 pm.  Pick up address: [address] [employer] [address] [address]  Drop off address: [address] [place] [address]

## 2024-05-11 NOTE — Patient Instructions (Signed)
 VISIT SUMMARY:  You had a follow-up visit today to review your health conditions and medications. We discussed your recent surgery for adhesive capsulitis, diabetes management, hypertension, hyperlipidemia, hypothyroidism, and a nasal lesion. We also reviewed your general health maintenance needs, including upcoming screenings and exams.  YOUR PLAN:  -TYPE 2 DIABETES MELLITUS: Your A1c has increased to 6.9%, indicating that your blood sugar control is not optimal. We will increase your Trulicity  dose to 3 mg weekly. Please use two 1.5 mg pens until your current supply is exhausted, then switch to the 3 mg pen. It's important to monitor your blood sugar daily and make dietary changes to include healthier snacks and eliminate sugary drinks.  -HYPERTENSION: Your blood pressure has improved but needs regular monitoring. Please recheck your blood pressure weekly. We may adjust your medication if your blood pressure remains low.  -HYPERLIPIDEMIA: You are currently taking atorvastatin  80 mg daily to manage your cholesterol levels. No changes to your medication are needed at this time.  -HYPOTHYROIDISM: You are taking levothyroxine  daily for your thyroid . We will order thyroid  function tests to check your levels since your last test was in September of last year.  -NASAL LESION: You have a lesion near your left nostril that needs further evaluation. We will refer you to a dermatologist to rule out any serious conditions like basal cell carcinoma.  -ADHESIVE CAPSULITIS (POST-SURGERY): You have completed physical therapy following your surgery in April. No further action is needed at this time.  -GENERAL HEALTH MAINTENANCE: You are due for a colon cancer screening and a diabetic eye exam. We will provide you with a stool kit for the colon cancer screening, which you should return the same day you use it. Your diabetic eye exam is scheduled with Dr. Alean on October 10. Continue to engage in regular  physical activity, such as walking, and consider dietary modifications to improve your overall health.  INSTRUCTIONS:  Please follow up with weekly blood pressure checks and daily blood glucose monitoring. Use the stool kit for colon cancer screening and return it the same day. Attend your diabetic eye exam with Dr. Alean on October 10. We will also order thyroid  function tests for you.

## 2024-05-12 ENCOUNTER — Encounter: Payer: Self-pay | Admitting: Internal Medicine

## 2024-05-12 ENCOUNTER — Other Ambulatory Visit: Payer: Self-pay | Admitting: Internal Medicine

## 2024-05-12 ENCOUNTER — Ambulatory Visit: Payer: Self-pay | Admitting: Internal Medicine

## 2024-05-12 DIAGNOSIS — L602 Onychogryphosis: Secondary | ICD-10-CM

## 2024-05-12 LAB — HEPATIC FUNCTION PANEL
ALT: 23 IU/L (ref 0–32)
AST: 19 IU/L (ref 0–40)
Albumin: 4.7 g/dL (ref 3.9–4.9)
Alkaline Phosphatase: 117 IU/L (ref 44–121)
Bilirubin Total: 0.5 mg/dL (ref 0.0–1.2)
Bilirubin, Direct: 0.14 mg/dL (ref 0.00–0.40)
Total Protein: 8.1 g/dL (ref 6.0–8.5)

## 2024-05-12 LAB — TSH: TSH: 1.47 u[IU]/mL (ref 0.450–4.500)

## 2024-05-14 ENCOUNTER — Other Ambulatory Visit: Payer: Self-pay

## 2024-05-14 ENCOUNTER — Telehealth: Payer: Self-pay

## 2024-05-14 NOTE — Telephone Encounter (Signed)
 Pharmacy Patient Advocate Encounter  Received notification from [facility] MEDICAID that Prior Authorization for TRULICITY  has been APPROVED from 05/12/2024 to 05/12/2025   PA #/Case ID/Reference #: [id number]

## 2024-05-19 ENCOUNTER — Other Ambulatory Visit: Payer: Self-pay | Admitting: Internal Medicine

## 2024-05-19 DIAGNOSIS — E1169 Type 2 diabetes mellitus with other specified complication: Secondary | ICD-10-CM

## 2024-05-20 ENCOUNTER — Ambulatory Visit (INDEPENDENT_AMBULATORY_CARE_PROVIDER_SITE_OTHER): Admitting: Podiatry

## 2024-05-20 ENCOUNTER — Encounter: Payer: Self-pay | Admitting: Internal Medicine

## 2024-05-20 ENCOUNTER — Encounter: Payer: Self-pay | Admitting: Podiatry

## 2024-05-20 DIAGNOSIS — B353 Tinea pedis: Secondary | ICD-10-CM | POA: Diagnosis not present

## 2024-05-20 DIAGNOSIS — B351 Tinea unguium: Secondary | ICD-10-CM

## 2024-05-20 DIAGNOSIS — M79672 Pain in left foot: Secondary | ICD-10-CM

## 2024-05-20 DIAGNOSIS — M79671 Pain in right foot: Secondary | ICD-10-CM | POA: Diagnosis not present

## 2024-05-20 MED ORDER — KETOCONAZOLE 2 % EX CREA: 1.0000 | TOPICAL_CREAM | Freq: Two times a day (BID) | CUTANEOUS | 2 refills | Status: AC

## 2024-05-20 MED ORDER — TERBINAFINE HCL 250 MG PO TABS: 250.0000 mg | ORAL_TABLET | Freq: Every day | ORAL | 0 refills | Status: AC

## 2024-05-20 NOTE — Progress Notes (Signed)
 Patient presents for evaluation and treatment of tenderness and some redness around nails feet.  Tenderness around toes with walking and wearing shoes.  Also complains of rash on the feet bilaterally years and does not seem to go away.  Physical exam:  General appearance: Alert, pleasant, and in no acute distress.  Vascular: Pedal pulses: DP 2/4 B/L, PT 2/4 B/L.  Mild edema lower legs bilaterally.  Capillary refill time immediate bilaterally  Neurological:   light touch intact bilaterally.  Normal Achilles tendon reflex bilaterally.   Dermatologic:  Nails thickened, disfigured, discolored 1-5 BL with subungual debris.  Redness and hypertrophic nail folds along nail folds bilaterally but no signs of drainage or infection.  Dry scaly erythematous rash in a moccasin distribution feet  Musculoskeletal:  Mild hammertoe deformities 2 through 5 bilaterally good muscle strength lower extremity   Diagnosis: 1. Painful onychomycotic nails 1 through 5 bilaterally. 2. Pain toes 1 through 5 bilaterally. 3.  Tinea pedis bilaterally  Plan: -New patient office visit level 3 for evaluation management. - Discussed the nails and the tinea pedis.  Would recommend treating with Lamisil  for the nails tinea pedis.  The Lamisil  should help with the tinea pedis also.  Discussed oral and topical treatments for onychomycosis.  Discussed risk and benefits of both.  Pursue Lamisil  treatment.  Labs every check LFTs for safety -Debrided onychomycotic nails 1 through 5 bilaterally. -Order for lab use.  Return 3 months

## 2024-05-21 ENCOUNTER — Encounter (HOSPITAL_COMMUNITY): Payer: Self-pay

## 2024-05-21 ENCOUNTER — Emergency Department (HOSPITAL_COMMUNITY)

## 2024-05-21 ENCOUNTER — Encounter: Payer: Self-pay | Admitting: Internal Medicine

## 2024-05-21 ENCOUNTER — Other Ambulatory Visit: Payer: Self-pay

## 2024-05-21 ENCOUNTER — Emergency Department (HOSPITAL_COMMUNITY)
Admission: EM | Admit: 2024-05-21 | Discharge: 2024-05-21 | Disposition: A | Discharge status: Home / Self Care | Attending: Emergency Medicine | Admitting: Emergency Medicine

## 2024-05-21 DIAGNOSIS — I10 Essential (primary) hypertension: Secondary | ICD-10-CM | POA: Diagnosis not present

## 2024-05-21 DIAGNOSIS — Y92512 Supermarket, store or market as the place of occurrence of the external cause: Secondary | ICD-10-CM | POA: Insufficient documentation

## 2024-05-21 DIAGNOSIS — W010XXA Fall on same level from slipping, tripping and stumbling without subsequent striking against object, initial encounter: Secondary | ICD-10-CM | POA: Diagnosis not present

## 2024-05-21 DIAGNOSIS — W19XXXA Unspecified fall, initial encounter: Secondary | ICD-10-CM | POA: Diagnosis not present

## 2024-05-21 DIAGNOSIS — S62314A Displaced fracture of base of fourth metacarpal bone, right hand, initial encounter for closed fracture: Secondary | ICD-10-CM | POA: Diagnosis not present

## 2024-05-21 DIAGNOSIS — S62310A Displaced fracture of base of second metacarpal bone, right hand, initial encounter for closed fracture: Secondary | ICD-10-CM | POA: Insufficient documentation

## 2024-05-21 DIAGNOSIS — R609 Edema, unspecified: Secondary | ICD-10-CM | POA: Diagnosis not present

## 2024-05-21 DIAGNOSIS — S6991XA Unspecified injury of right wrist, hand and finger(s), initial encounter: Secondary | ICD-10-CM | POA: Diagnosis present

## 2024-05-21 DIAGNOSIS — S6291XA Unspecified fracture of right wrist and hand, initial encounter for closed fracture: Secondary | ICD-10-CM

## 2024-05-21 DIAGNOSIS — S92321A Displaced fracture of second metatarsal bone, right foot, initial encounter for closed fracture: Secondary | ICD-10-CM | POA: Diagnosis not present

## 2024-05-21 DIAGNOSIS — S0081XA Abrasion of other part of head, initial encounter: Secondary | ICD-10-CM | POA: Insufficient documentation

## 2024-05-21 DIAGNOSIS — M25551 Pain in right hip: Secondary | ICD-10-CM | POA: Diagnosis not present

## 2024-05-21 DIAGNOSIS — S62312A Displaced fracture of base of third metacarpal bone, right hand, initial encounter for closed fracture: Secondary | ICD-10-CM | POA: Diagnosis not present

## 2024-05-21 DIAGNOSIS — Z79899 Other long term (current) drug therapy: Secondary | ICD-10-CM | POA: Insufficient documentation

## 2024-05-21 MED ORDER — TRAMADOL HCL 50 MG PO TABS: 50.0000 mg | ORAL_TABLET | Freq: Four times a day (QID) | ORAL | 0 refills | Status: AC | PRN

## 2024-05-21 NOTE — Discharge Instructions (Addendum)
 We discussed the results of your right hand fracture, you will need to follow-up with the specialist at your earliest convenience.  You will have your right hand placed on a splint, please keep this in place until you follow-up with your orthopedist.  You were given a prescription for pain medication, please take this medication as prescribed.

## 2024-05-21 NOTE — ED Triage Notes (Signed)
 BIBA from [facility], tripped and fell- injury to right hand, bumped forehead on the floor. No thinners, no LOC. 129 cbg 107/60 bp 95% room air

## 2024-05-21 NOTE — ED Provider Notes (Signed)
 [name] Provider Note   CSN: [id number] Arrival date & time: 05/21/24  1333     Patient presents with: Hand Injury   [name] is a 63 y.o. female.   63 y.o female with a PMH of hypertension presents to the ED status post mechanical fall.  Patient reports she was at [name] trying to get to a sale, when she slipped under the barrier reports that she tried to catch most of her fall with her right hand, there is significant pain to the right hip specially with flexion and extension.  She reports she did grazed the right side of her head but there was no loss of consciousness.  She is currently not on any blood thinners.  She did not receive any medication with EMS.  She is complaining of pain along the right side of her trunk, reports she might have bumped this on the ground.  No other complaints reported.  The history is provided by the patient.  Hand Injury Associated symptoms: no fever        Prior to Admission medications   Medication Sig Start Date End Date Taking? Authorizing Provider  traMADol  (ULTRAM ) 50 MG tablet Take 1 tablet (50 mg total) by mouth every 6 (six) hours as needed for up to 3 days. 05/21/24 05/24/24 Yes Maddux First, PA-C  Accu-Chek FastClix Lancets MISC USE AS DIRECTED TO CHECK BLOOD SUGAR THREE TIMES DAILY 11/24/23   Vicci Barnie NOVAK, MD  ACCU-CHEK GUIDE TEST test strip USE TO TEST THREE TIMES DAILY 11/10/23   Vicci Barnie NOVAK, MD  Accu-Chek Softclix Lancets lancets Use as instructed to check blood sugar three times daily 03/26/23   Vicci Barnie NOVAK, MD  acetaminophen  (TYLENOL ) 500 MG tablet Take 1,000 mg by mouth daily as needed for mild pain (pain score 1-3) or moderate pain (pain score 4-6).    [provider]  amitriptyline  (ELAVIL ) 50 MG tablet Take 1 tablet (50 mg total) by mouth at bedtime. Patient taking differently: Take 50 mg by mouth at bedtime as needed for sleep. 11/17/23   Vicci Barnie NOVAK, MD  amLODipine  (NORVASC ) 10 MG tablet Take 1 tablet (10 mg total) by mouth daily. 05/11/24   Vicci Barnie NOVAK, MD  atorvastatin  (LIPITOR) 80 MG tablet TAKE ONE TABLET BY MOUTH EVERY DAY 02/17/24   Vicci Barnie NOVAK, MD  busPIRone  (BUSPAR ) 10 MG tablet Take 10 mg by mouth daily.    [provider]  cyclobenzaprine  (FLEXERIL ) 10 MG tablet TAKE ONE TABLET BY MOUTH TWICE DAILY AS NEEDED FOR muscla FOR PAIN AND SPASMS 05/03/24   Georgina Ozell LABOR, MD  Dulaglutide  (TRULICITY ) 3 MG/0.5ML SOAJ Inject 3 mg as directed once a week. Double up on your current 1.5 mg pens until you run out. 05/11/24   Vicci Barnie NOVAK, MD  DULoxetine  (CYMBALTA ) 30 MG capsule Take 1 capsule (30 mg total) by mouth 2 (two) times daily. Patient taking differently: Take 30 mg by mouth daily. 01/20/24   Vicci Barnie NOVAK, MD  gabapentin  (NEURONTIN ) 300 MG capsule Take 1 capsule (300 mg total) by mouth at bedtime. 03/09/24   Ines Onetha NOVAK, MD  ketoconazole  (NIZORAL ) 2 % cream Apply 1 Application topically 2 (two) times daily. 05/20/24 08/18/24  Christine Rush, DPM  levothyroxine  (SYNTHROID ) 50 MCG tablet TAKE ONE TABLET BY MOUTH EVERY MORNING 12/22/23   Vicci Barnie NOVAK, MD  lisinopril  (ZESTRIL ) 20 MG tablet Take 1 tablet (20 mg total) by  mouth daily. 01/29/24   Vicci Barnie NOVAK, MD  lurasidone (LATUDA) 20 MG TABS tablet Take 20 mg by mouth at bedtime. 12/30/23   [provider]  meloxicam  (MOBIC ) 15 MG tablet Take 1 tablet (15 mg total) by mouth daily. Do not take with other NSAIDs such as Celebrex  or Ibuprofen  04/22/24 04/22/25  Magnant, Charles L, PA-C  omeprazole  (PRILOSEC) 20 MG capsule Take 1 capsule (20 mg total) by mouth daily. 05/04/24   Vicci Barnie NOVAK, MD  oxyCODONE  (ROXICODONE ) 5 MG immediate release tablet Take 1 tablet (5 mg total) by mouth every 4 (four) hours as needed for severe pain (pain score 7-10). 01/29/24   Magnant, Charles L, PA-C  propranolol  (INDERAL ) 80 MG tablet Take 1 tablet (80 mg total) by  mouth 2 (two) times daily. Patient taking differently: Take 80 mg by mouth 3 (three) times daily. 12/17/22   Vicci Barnie NOVAK, MD  terbinafine  (LAMISIL ) 250 MG tablet Take 1 tablet (250 mg total) by mouth daily. 05/20/24   Christine Rush, DPM    Allergies: Iron, Metformin  and related, and Penicillins    Review of Systems  Constitutional:  Negative for fever.  Musculoskeletal:  Positive for arthralgias.    Updated Vital Signs BP 111/83   Pulse 65   Temp 97.9 F (36.6 C) (Oral)   Resp 17   LMP 06/06/2015   SpO2 98%   Physical Exam Vitals and nursing note reviewed.  HENT:     Head: Normocephalic.     Comments: Small abrasion to the right forehead.     Nose: Nose normal.  Cardiovascular:     Rate and Rhythm: Normal rate.  Pulmonary:     Effort: Pulmonary effort is normal.  Abdominal:     General: Abdomen is flat.  Musculoskeletal:        General: Swelling present.     Right hand: Swelling, deformity, tenderness and bony tenderness present. Decreased strength of wrist extension. There is no disruption of two-point discrimination. Normal capillary refill. Normal pulse.     Cervical back: Normal range of motion and neck supple.     Comments: Significant tenderness to palpation along the base of the second, third, fourth digit especially with flexion.  Decreased range of motion.  2+ radial pulses.  Skin:    General: Skin is warm and dry.  Neurological:     Mental Status: She is alert and oriented to person, place, and time.     (all labs ordered are listed, but only abnormal results are displayed) Labs Reviewed - No data to display  EKG: None  Radiology: DG Hand Complete Right Result Date: 05/21/2024 CLINICAL DATA:  Right hand pain after fall. EXAM: RIGHT HAND - COMPLETE 3+ VIEW COMPARISON:  None Available. FINDINGS: Minimally displaced fracture is seen involving proximal base of second metatarsal. Mildly displaced and possibly comminuted fractures of proximal portions of  third and fourth metatarsals are noted. No dislocation is noted. IMPRESSION: Second, third and fourth metatarsal fractures as noted above. Electronically Signed   By: Lynwood Landy Raddle M.D.   On: 05/21/2024 14:29     Procedures   Medications Ordered in the ED - No data to display                                  Medical Decision Making Amount and/or Complexity of Data Reviewed Radiology: ordered.  Risk Prescription drug management.   Patient presents  to the ED with a chief complaint of right hand pain status post mechanical fall while at [facility] trying to get to a sandal.  Does have pain to the right wrist specially with any kind of flexion and extension.  X-ray is remarkable for a fracture of the second, third, fourth middle carpals.  X-ray of the right hand showed: Minimally displaced fracture is seen involving proximal base of  second metatarsal. Mildly displaced and possibly comminuted  fractures of proximal portions of third and fourth metatarsals are  noted. No dislocation is noted.   These results were discussed with patient and her husband at the bedside.  We discussed a short course of pain control, will go home on a ulnar gutter splint.  I did discuss this case with Ozell Purchase, APP for hand today.  The rest of her exam is benign.  Patient is hemodynamically stable for discharge.   Portions of this note were generated with Scientist, clinical (histocompatibility and immunogenetics). Dictation errors may occur despite best attempts at proofreading.      Final diagnoses:  Closed fracture of right hand, initial encounter    ED Discharge Orders          Ordered    traMADol  (ULTRAM ) 50 MG tablet  Every 6 hours PRN        05/21/24 1450               Dayleen Beske, PA-C 05/21/24 1456    Lenor Hollering, MD 05/21/24 1515

## 2024-05-22 ENCOUNTER — Other Ambulatory Visit: Payer: Self-pay | Admitting: Internal Medicine

## 2024-05-22 DIAGNOSIS — S62609A Fracture of unspecified phalanx of unspecified finger, initial encounter for closed fracture: Secondary | ICD-10-CM

## 2024-05-24 ENCOUNTER — Telehealth: Payer: Self-pay | Admitting: Internal Medicine

## 2024-05-24 NOTE — Telephone Encounter (Signed)
 She was already referred to Orthopedic by PCP

## 2024-05-24 NOTE — Telephone Encounter (Signed)
 Copied from CRM [id number]. Topic: Referral - Question >> May 24, 2024 10:04 AM Gennette ORN wrote: Reason for CRM: [facility] [phone] is calling because patient has a fracture in her hand and requesting to see a referral.

## 2024-05-27 ENCOUNTER — Ambulatory Visit (INDEPENDENT_AMBULATORY_CARE_PROVIDER_SITE_OTHER): Payer: Self-pay

## 2024-05-27 ENCOUNTER — Encounter: Payer: Self-pay | Admitting: Surgical

## 2024-05-27 ENCOUNTER — Ambulatory Visit: Admitting: Physical Medicine and Rehabilitation

## 2024-05-27 ENCOUNTER — Telehealth: Payer: Self-pay

## 2024-05-27 ENCOUNTER — Ambulatory Visit: Admitting: Surgical

## 2024-05-27 VITALS — BP 130/84 | HR 80

## 2024-05-27 DIAGNOSIS — M79641 Pain in right hand: Secondary | ICD-10-CM | POA: Diagnosis not present

## 2024-05-27 DIAGNOSIS — S62322A Displaced fracture of shaft of third metacarpal bone, right hand, initial encounter for closed fracture: Secondary | ICD-10-CM | POA: Diagnosis not present

## 2024-05-27 NOTE — Telephone Encounter (Signed)
 Lvm for pt's son to cb to confirm appt from monday

## 2024-05-27 NOTE — Progress Notes (Signed)
 Pain Scale   Average Pain 5 Patient advising she has chronic neck pain radiating to her shoulders. Patient advising her pain increases in am and is constant.        +Driver, -BT, -Dye Allergies.

## 2024-05-27 NOTE — Progress Notes (Signed)
 Office Visit Note   Patient: [name]           Date of Birth: May 23, 1961           MRN: [id number] Visit Date: 05/27/2024 Requested by: Vicci Barnie NOVAK, MD [address] [place],  [place] [address] PCP: Vicci Barnie NOVAK, MD  Subjective: Chief Complaint  Patient presents with   Right Hand - Fracture    Fall 05/21/2024    HPI: [name] is a 63 y.o. female who presents to the office reporting right hand pain.  She is left-hand dominant.  Sustained a fall on 05/21/2024 at [facility].  Fell onto her right hand and had immediate pain.  Evaluated at [facility] emergency department with radiographs demonstrating multiple metacarpal fractures.  She was placed in splint and referred.  She states she has had a lot of swelling.  Taking Flexeril  and oxycodone  for pain control.  Denies any numbness or tingling.  No other joints bothering her since the fall..                ROS: All systems reviewed are negative as they relate to the chief complaint within the history of present illness.  Patient denies fevers or chills.  Assessment & Plan: Visit Diagnoses:  1. Closed displaced fracture of shaft of third metacarpal bone of right hand, initial encounter   2. Pain in right hand     Plan: Impression is multiple metacarpal fractures in the right hand in a patient who is left-hand dominant.  No rotational deformity.  Discussed with Dr. Erwin and he we will plan to see her in clinic early next week for evaluation of casting versus surgical intervention.  Did recommend that she continue no lifting with the injured extremity and start elevating to help with the significant swelling that she has today.  Follow-Up Instructions: Return f/u with dr Erwin.   Orders:  Orders Placed This Encounter  Procedures   XR Hand Complete Right   No orders of the defined types were placed in this encounter.     Procedures: No procedures performed   Clinical Data: No additional  findings.  Objective: Vital Signs: LMP 06/06/2015   Physical Exam:  Constitutional: Patient appears well-developed HEENT:  Head: Normocephalic Eyes:EOM are normal Neck: Normal range of motion Cardiovascular: Normal rate Pulmonary/chest: Effort normal Neurologic: Patient is alert Skin: Skin is warm Psychiatric: Patient has normal mood and affect  Ortho Exam: Ortho exam demonstrates right hand with palpable radial pulse.  Intact EPL, FPL, finger abduction.  Not able to make full composite fist.  She can only slightly flex her fingers due to pain and swelling but there is no observable rotational deformity of any of the digits.  She has significant swelling and ecchymosis noted throughout the hand with no swelling noted proximal to the distal forearm.  No tenderness over the anatomic snuffbox or distal radius.  No skin abrasion or laceration.  Specialty Comments:  MRI CERVICAL SPINE WITHOUT CONTRAST   TECHNIQUE: Multiplanar, multisequence MR imaging of the cervical spine was performed. No intravenous contrast was administered.   COMPARISON:  None Available.   FINDINGS: Alignment: Physiologic.   Vertebrae: No acute fracture, evidence of discitis, or aggressive bone lesion.   Cord: Normal signal and morphology.   Posterior Fossa, vertebral arteries, paraspinal tissues: Posterior fossa demonstrates no focal abnormality. Vertebral artery flow voids are maintained. Paraspinal soft tissues are unremarkable.   Disc levels:   Discs: Degenerative  disease with disc height loss at C4-5, C5-6 and C6-7.   C2-3: No disc protrusion. Mild right facet arthropathy. Moderate-severe right foraminal stenosis. No left foraminal stenosis. No spinal stenosis.   C3-4: Mild broad-based disc bulge. Moderate left and mild right facet arthropathy. Moderate-severe left foraminal stenosis. Mild right foraminal stenosis. No spinal stenosis.   C4-5: Broad left paracentral disc protrusion contacting  the left ventral paracentral cervical spinal cord. Moderate left foraminal stenosis. Mild right foraminal stenosis. Mild spinal stenosis.   C5-6: Mild broad-based disc bulge. Bilateral uncovertebral degenerative changes. Severe bilateral foraminal stenosis. Mild spinal stenosis.   C6-7: Broad-based disc bulge. Mild-moderate left foraminal stenosis. Mild right foraminal stenosis. No spinal stenosis.   C7-T1: Small central disc protrusion. No foraminal or central canal stenosis.   IMPRESSION: 1. Cervical spine spondylosis as described above. 2. No acute osseous injury of the cervical spine.     Electronically Signed   By: Julaine Blanch M.D.   On: 12/25/2022 09:13  Imaging: XR Hand Complete Right Result Date: 05/27/2024 PA, oblique, lateral views of right hand reviewed.  Multiple metacarpal fractures noted with displacement of the 3rd and 4th metacarpal shafts without significant angulation.  There is nondisplaced fracture of the second metacarpal base.  No other fractures noted.    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   Brain atrophy (HCC) 12/18/2022   Complete tear of right rotator cuff 05/16/2022   Adhesive capsulitis of right shoulder 03/14/2022   Influenza vaccine needed 10/31/2020   Need for diphtheria-tetanus-pertussis (Tdap) vaccine 10/31/2020   Hyperlipidemia associated with type 2 diabetes mellitus (HCC) 10/31/2020   Overweight (BMI 25.0-29.9) 10/31/2020   Type 2 diabetes mellitus without complication, without long-term current use of insulin  (HCC) 07/31/2020   OSA (obstructive sleep apnea) 02/02/2020   Spondylosis without myelopathy or radiculopathy, lumbar region 09/17/2017   Essential hypertension 09/05/2017   Chronic bilateral low  back pain with left-sided sciatica 09/05/2017   HTN (hypertension) 05/05/2017   Hypothyroidism 02/24/2017   Mixed hyperlipidemia 02/24/2017   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   Sleep related headaches 01/23/2017   Snoring 01/23/2017   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   Chronic headaches 10/30/2016   Obesity (BMI 30.0-34.9) 05/24/2016   PMB (postmenopausal bleeding) 05/24/2016   Back pain of lumbar region with sciatica 09/28/2015   Past Medical History:  Diagnosis Date   Anxiety    Arthritis    Depression    Diabetes mellitus without complication (HCC)    GERD (gastroesophageal reflux disease)    Hyperlipidemia    Hypertension    Hypothyroidism    Migraines    Thyroid  disease     Family History  Problem Relation Age of Onset   Headache Son    Colon cancer Neg Hx    Esophageal cancer Neg Hx    Rectal cancer Neg Hx    Stomach cancer Neg Hx    Breast cancer Neg Hx    Migraines Neg Hx     Past Surgical History:  Procedure Laterality Date   ANTERIOR CERVICAL DECOMP/DISCECTOMY FUSION N/A 03/11/2023   Procedure: C4-5, C5-6, C6-7 ANTERIOR CERVICAL DECOMPRESSION/DISCECTOMY FUSION 3 LEVELS;  Surgeon: Georgina Ozell LABOR, MD;  Location: [facility] OR;  Service: Orthopedics;  Laterality: N/A;   BICEPS TENDON REPAIR Right 01/29/2024   Procedure: REPAIR,  TENDON, BICEPS, PROXIMAL;  Surgeon: Addie Cordella Hamilton, MD;  Location: [facility] OR;  Service: Orthopedics;  Laterality: Right;   BREAST CYST EXCISION Right 1980   BREAST CYST EXCISION Left 1985   EXAM UNDER ANESTHESIA WITH MANIPULATION OF SHOULDER Right 01/29/2024   Procedure: EXAM UNDER ANESTHESIA, SHOULDER, WITH MANIPULATION;  Surgeon: Addie Cordella Hamilton, MD;  Location: [facility] OR;  Service: Orthopedics;  Laterality: Right;  RIGHT SHOULDER MUA with ARTHROSCOPC ROTATOR INTERVAL RELEASE AND POSSIBLE MINI OPEN BICEP TENODESIS   NO PAST SURGERIES     POSTERIOR LUMBAR FUSION 2 WITH HARDWARE REMOVAL Right 01/29/2024   Procedure: ARTHROSCOPY,  SHOULDER WITH DEBRIDEMENT;  Surgeon: Addie Cordella Hamilton, MD;  Location: [facility] OR;  Service: Orthopedics;  Laterality: Right;  RIGHT SHOULDER MUA with ARTHROSCOPC ROTATOR INTERVAL RELEASE AND POSSIBLE MINI OPEN BICEP TENODESIS   Social History   Occupational History   Occupation: Unemployed  Tobacco Use   Smoking status: Never   Smokeless tobacco: Never  Vaping Use   Vaping status: Never Used  Substance and Sexual Activity   Alcohol use: No    Alcohol/week: 0.0 standard drinks of alcohol   Drug use: No   Sexual activity: Not Currently    Partners: Male

## 2024-05-27 NOTE — Telephone Encounter (Signed)
 Pt can't come until Tuesday, appt r/s

## 2024-05-31 ENCOUNTER — Ambulatory Visit: Admitting: Orthopedic Surgery

## 2024-06-01 ENCOUNTER — Ambulatory Visit: Admitting: Orthopedic Surgery

## 2024-06-01 ENCOUNTER — Other Ambulatory Visit (INDEPENDENT_AMBULATORY_CARE_PROVIDER_SITE_OTHER): Payer: Self-pay

## 2024-06-01 DIAGNOSIS — M79641 Pain in right hand: Secondary | ICD-10-CM

## 2024-06-01 DIAGNOSIS — S62324A Displaced fracture of shaft of fourth metacarpal bone, right hand, initial encounter for closed fracture: Secondary | ICD-10-CM

## 2024-06-01 DIAGNOSIS — S62322A Displaced fracture of shaft of third metacarpal bone, right hand, initial encounter for closed fracture: Secondary | ICD-10-CM | POA: Diagnosis not present

## 2024-06-01 NOTE — Progress Notes (Signed)
 [name] - 63 y.o. female MRN [id number]  Date of birth: [birth year]  Office Visit Note: Visit Date: 06/01/2024 PCP: Vicci Barnie NOVAK, MD Referred by: Vicci Barnie NOVAK, MD  Subjective: No chief complaint on file.  HPI: [name] is a pleasant 63 y.o. female who presents today for evaluation of right hand injury sustained 10 days prior as part of a mechanical fall.  She was seen initially by Herlene Calix, PA who referred her to me for specific hand surgical evaluation.  At that time, she underwent clinical and radiographic work which showed nondisplaced metacarpal fracture of the index finger as well as oblique fractures of the long and ring finger metacarpal shafts.  She has been placed to a splint since that time.  Does have some ongoing swelling throughout the hand with associated stiffness secondary to pain.  She has been referred to myself for specific hand surgical evaluation.  Pertinent ROS were reviewed with the patient and found to be negative unless otherwise specified above in HPI.   Visit Reason: multiple hand fractures Duration of symptoms: 10 days Hand dominance: left Occupation: retired Diabetic: Yes, 6.9 Smoking: No Heart/Lung History: HTN (hypertension) Essential hypertension  OSA (obstructive sleep apnea)   Blood Thinners: none  Prior Testing/EMG: x-rays Injections (Date): none Treatments: splint Prior Surgery: none  Assessment & Plan: Visit Diagnoses:  1. Closed displaced fracture of shaft of third metacarpal bone of right hand, initial encounter   2. Pain in right hand   3. Closed displaced fracture of shaft of fourth metacarpal bone of right hand, initial encounter     Plan: Based on clinical and radiographic workup today, patient does have evidence of nondisplaced fracture of the index finger metacarpal base as well as slightly displaced metacarpal shaft fractures of the long and ring finger.  Fortunately, there is no significant  interval displacement from her prior x-rays.  She does not have any clinical rotational abnormalities on examination today.  Given the minimal displacement of these fracture without rotation abnormality, we can continue with nonoperative treatment.  We discussed immobilization with short arm casting today for added stability.  Patient was in agreement with this plan.  Short arm cast was applied today.  Patient will follow-up in 2 to 3 weeks for clinical and radiographic recheck, at that juncture we can likely transition to a removable brace if there is enough clinical and radiographic healing.  Follow-up: No follow-ups on file.   Meds & Orders: No orders of the defined types were placed in this encounter.   Orders Placed This Encounter  Procedures   XR Hand Complete Right     Procedures: No procedures performed      Clinical History: MRI CERVICAL SPINE WITHOUT CONTRAST   TECHNIQUE: Multiplanar, multisequence MR imaging of the cervical spine was performed. No intravenous contrast was administered.   COMPARISON:  None Available.   FINDINGS: Alignment: Physiologic.   Vertebrae: No acute fracture, evidence of discitis, or aggressive bone lesion.   Cord: Normal signal and morphology.   Posterior Fossa, vertebral arteries, paraspinal tissues: Posterior fossa demonstrates no focal abnormality. Vertebral artery flow voids are maintained. Paraspinal soft tissues are unremarkable.   Disc levels:   Discs: Degenerative disease with disc height loss at C4-5, C5-6 and C6-7.   C2-3: No disc protrusion. Mild right facet arthropathy. Moderate-severe right foraminal stenosis. No left foraminal stenosis. No spinal stenosis.   C3-4: Mild broad-based disc bulge. Moderate left and mild right facet arthropathy. Moderate-severe  left foraminal stenosis. Mild right foraminal stenosis. No spinal stenosis.   C4-5: Broad left paracentral disc protrusion contacting the left ventral paracentral  cervical spinal cord. Moderate left foraminal stenosis. Mild right foraminal stenosis. Mild spinal stenosis.   C5-6: Mild broad-based disc bulge. Bilateral uncovertebral degenerative changes. Severe bilateral foraminal stenosis. Mild spinal stenosis.   C6-7: Broad-based disc bulge. Mild-moderate left foraminal stenosis. Mild right foraminal stenosis. No spinal stenosis.   C7-T1: Small central disc protrusion. No foraminal or central canal stenosis.   IMPRESSION: 1. Cervical spine spondylosis as described above. 2. No acute osseous injury of the cervical spine.     Electronically Signed   By: Julaine Blanch M.D.   On: 12/25/2022 09:13  She reports that she has never smoked. She has never used smokeless tobacco.  Recent Labs    11/17/23 1642 01/27/24 0844 05/11/24 1516  HGBA1C 6.6 5.5 6.9    Objective:   Vital Signs: LMP 06/06/2015   Physical Exam  Gen: Well-appearing, in no acute distress; non-toxic CV: Regular Rate. Well-perfused. Warm.  Resp: Breathing unlabored on room air; no wheezing. Psych: Fluid speech in conversation; appropriate affect; normal thought process  Ortho Exam Right hand: - Diffuse swelling throughout the dorsal aspect of the hand and digits - Able to initiate composite fist without notable digital overlap, appropriate cascade is appreciated of all digits without significant rotational abnormality - Sensation intact distally in all distributions median/radial/ulnar, AIN/PIN/interosseous intact - Hand remains warm well-perfused  Imaging: XR Hand Complete Right Result Date: 06/01/2024 Multiple views of right hand reviewed.  As seen on prior films, multiple metacarpal fractures noted with displacement of the 3rd and 4th metacarpal shafts without significant angulation.  There is nondisplaced fracture of the second metacarpal base.     Past Medical/Family/Surgical/Social History: Medications & Allergies reviewed per EMR, new medications  updated. 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   Brain atrophy (HCC) 12/18/2022   Complete tear of right rotator cuff 05/16/2022   Adhesive capsulitis of right shoulder 03/14/2022   Influenza vaccine needed 10/31/2020   Need for diphtheria-tetanus-pertussis (Tdap) vaccine 10/31/2020   Hyperlipidemia associated with type 2 diabetes mellitus (HCC) 10/31/2020   Overweight (BMI 25.0-29.9) 10/31/2020   Type 2 diabetes mellitus without complication, without long-term current use of insulin  (HCC) 07/31/2020   OSA (obstructive sleep apnea) 02/02/2020   Spondylosis without myelopathy or radiculopathy, lumbar region 09/17/2017   Essential hypertension 09/05/2017   Chronic bilateral low back pain with left-sided sciatica 09/05/2017   HTN (hypertension) 05/05/2017   Hypothyroidism 02/24/2017   Mixed hyperlipidemia 02/24/2017   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   Sleep related headaches 01/23/2017   Snoring 01/23/2017   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   Chronic headaches 10/30/2016   Obesity (BMI 30.0-34.9) 05/24/2016   PMB (postmenopausal bleeding) 05/24/2016   Back pain of lumbar region with sciatica 09/28/2015   Past Medical History:  Diagnosis Date   Anxiety    Arthritis    Depression    Diabetes mellitus without complication (HCC)    GERD (gastroesophageal reflux disease)    Hyperlipidemia    Hypertension    Hypothyroidism    Migraines    Thyroid  disease    Family History  Problem Relation Age of Onset   Headache Son    Colon cancer Neg Hx  Esophageal cancer Neg Hx    Rectal cancer Neg Hx    Stomach cancer Neg Hx    Breast cancer Neg Hx    Migraines Neg Hx    Past Surgical History:  Procedure Laterality Date   ANTERIOR CERVICAL  DECOMP/DISCECTOMY FUSION N/A 03/11/2023   Procedure: C4-5, C5-6, C6-7 ANTERIOR CERVICAL DECOMPRESSION/DISCECTOMY FUSION 3 LEVELS;  Surgeon: Georgina Ozell LABOR, MD;  Location: [facility] OR;  Service: Orthopedics;  Laterality: N/A;   BICEPS TENDON REPAIR Right 01/29/2024   Procedure: REPAIR, TENDON, BICEPS, PROXIMAL;  Surgeon: Addie Cordella Hamilton, MD;  Location: [facility] OR;  Service: Orthopedics;  Laterality: Right;   BREAST CYST EXCISION Right 1980   BREAST CYST EXCISION Left 1985   EXAM UNDER ANESTHESIA WITH MANIPULATION OF SHOULDER Right 01/29/2024   Procedure: EXAM UNDER ANESTHESIA, SHOULDER, WITH MANIPULATION;  Surgeon: Addie Cordella Hamilton, MD;  Location: [facility] OR;  Service: Orthopedics;  Laterality: Right;  RIGHT SHOULDER MUA with ARTHROSCOPC ROTATOR INTERVAL RELEASE AND POSSIBLE MINI OPEN BICEP TENODESIS   NO PAST SURGERIES     POSTERIOR LUMBAR FUSION 2 WITH HARDWARE REMOVAL Right 01/29/2024   Procedure: ARTHROSCOPY, SHOULDER WITH DEBRIDEMENT;  Surgeon: Addie Cordella Hamilton, MD;  Location: [facility] OR;  Service: Orthopedics;  Laterality: Right;  RIGHT SHOULDER MUA with ARTHROSCOPC ROTATOR INTERVAL RELEASE AND POSSIBLE MINI OPEN BICEP TENODESIS   Social History   Occupational History   Occupation: Unemployed  Tobacco Use   Smoking status: Never   Smokeless tobacco: Never  Vaping Use   Vaping status: Never Used  Substance and Sexual Activity   Alcohol use: No    Alcohol/week: 0.0 standard drinks of alcohol   Drug use: No   Sexual activity: Not Currently    Partners: Male    Bernarr Longsworth Estela) Arlinda, M.D. [facility], Hand Surgery

## 2024-06-02 DIAGNOSIS — F330 Major depressive disorder, recurrent, mild: Secondary | ICD-10-CM | POA: Diagnosis not present

## 2024-06-15 ENCOUNTER — Encounter: Payer: Self-pay | Admitting: Orthopedic Surgery

## 2024-06-15 NOTE — Telephone Encounter (Signed)
 24 days out from fall. Has appt now for 9/2. Please advise

## 2024-06-16 ENCOUNTER — Ambulatory Visit: Admitting: Orthopedic Surgery

## 2024-06-16 NOTE — Telephone Encounter (Signed)
**Note De-identified  [place] Obfuscation** Please advise 

## 2024-06-17 ENCOUNTER — Ambulatory Visit: Admitting: Orthopedic Surgery

## 2024-06-17 ENCOUNTER — Other Ambulatory Visit (INDEPENDENT_AMBULATORY_CARE_PROVIDER_SITE_OTHER): Payer: Self-pay

## 2024-06-17 DIAGNOSIS — S62324A Displaced fracture of shaft of fourth metacarpal bone, right hand, initial encounter for closed fracture: Secondary | ICD-10-CM

## 2024-06-17 DIAGNOSIS — S62322A Displaced fracture of shaft of third metacarpal bone, right hand, initial encounter for closed fracture: Secondary | ICD-10-CM | POA: Diagnosis not present

## 2024-06-17 NOTE — Progress Notes (Signed)
 [name] - 63 y.o. female MRN [id number]  Date of birth: 1961/04/15  Office Visit Note: Visit Date: 06/17/2024 PCP: Vicci Barnie NOVAK, MD Referred by: Vicci Barnie NOVAK, MD  Subjective: No chief complaint on file.  HPI: [name] is a pleasant 63 y.o. female who returns today for follow up for nondisplaced metacarpal fracture of the index finger as well as oblique fractures of the long and ring finger metacarpal shafts.  She has been placed in a cast for the past 3 weeks.  Date of injury was 05/21/2024.   Pertinent ROS were reviewed with the patient and found to be negative unless otherwise specified above in HPI.     Assessment & Plan: Visit Diagnoses:  1. Closed displaced fracture of shaft of third metacarpal bone of right hand, initial encounter   2. Closed displaced fracture of shaft of fourth metacarpal bone of right hand, initial encounter     Plan: She continues to do well with nonoperative care.  There remains no significant interval displacement from her prior x-rays.  There is evidence of radiographic and clinical healing.  She does not have any clinical rotational abnormalities on examination today.  At this juncture, she would like to transition away from the cast.  We discussed immobilization with removable brace at this juncture.  I recommended that she maintain this for an additional 3 weeks during activities.  She is okay to remove the brace for hygiene and for range of motion exercises.  Follow-up with myself for clinical and radiographic recheck in 3 weeks.  Follow-up: No follow-ups on file.   Meds & Orders: No orders of the defined types were placed in this encounter.   Orders Placed This Encounter  Procedures   XR Hand Complete Right     Procedures: No procedures performed      Clinical History: MRI CERVICAL SPINE WITHOUT CONTRAST   TECHNIQUE: Multiplanar, multisequence MR imaging of the cervical spine was performed. No intravenous  contrast was administered.   COMPARISON:  None Available.   FINDINGS: Alignment: Physiologic.   Vertebrae: No acute fracture, evidence of discitis, or aggressive bone lesion.   Cord: Normal signal and morphology.   Posterior Fossa, vertebral arteries, paraspinal tissues: Posterior fossa demonstrates no focal abnormality. Vertebral artery flow voids are maintained. Paraspinal soft tissues are unremarkable.   Disc levels:   Discs: Degenerative disease with disc height loss at C4-5, C5-6 and C6-7.   C2-3: No disc protrusion. Mild right facet arthropathy. Moderate-severe right foraminal stenosis. No left foraminal stenosis. No spinal stenosis.   C3-4: Mild broad-based disc bulge. Moderate left and mild right facet arthropathy. Moderate-severe left foraminal stenosis. Mild right foraminal stenosis. No spinal stenosis.   C4-5: Broad left paracentral disc protrusion contacting the left ventral paracentral cervical spinal cord. Moderate left foraminal stenosis. Mild right foraminal stenosis. Mild spinal stenosis.   C5-6: Mild broad-based disc bulge. Bilateral uncovertebral degenerative changes. Severe bilateral foraminal stenosis. Mild spinal stenosis.   C6-7: Broad-based disc bulge. Mild-moderate left foraminal stenosis. Mild right foraminal stenosis. No spinal stenosis.   C7-T1: Small central disc protrusion. No foraminal or central canal stenosis.   IMPRESSION: 1. Cervical spine spondylosis as described above. 2. No acute osseous injury of the cervical spine.     Electronically Signed   By: Julaine Blanch M.D.   On: 12/25/2022 09:13  She reports that she has never smoked. She has never used smokeless tobacco.  Recent Labs    11/17/23 1642 01/27/24  0844 05/11/24 1516  HGBA1C 6.6 5.5 6.9    Objective:   Vital Signs: LMP 06/06/2015   Physical Exam  Gen: Well-appearing, in no acute distress; non-toxic CV: Regular Rate. Well-perfused. Warm.  Resp: Breathing  unlabored on room air; no wheezing. Psych: Fluid speech in conversation; appropriate affect; normal thought process  Ortho Exam Right hand: - Mild swelling throughout the dorsal aspect of the hand and digits - Able to initiate composite fist without notable digital overlap, appropriate cascade is appreciated of all digits without significant rotational abnormality - Sensation intact distally in all distributions median/radial/ulnar, AIN/PIN/interosseous intact - Hand remains warm well-perfused  Imaging: XR Hand Complete Right Result Date: 06/17/2024 X-rays of the right hand, multiple views were obtained which do show interval healing of the index, long and ring metacarpal shaft fractures.  No significant interval displacement in comparison to prior films.    Past Medical/Family/Surgical/Social History: Medications & Allergies reviewed per EMR, new medications updated. 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   Brain atrophy (HCC) 12/18/2022   Complete tear of right rotator cuff 05/16/2022   Adhesive capsulitis of right shoulder 03/14/2022   Influenza vaccine needed 10/31/2020   Need for diphtheria-tetanus-pertussis (Tdap) vaccine 10/31/2020   Hyperlipidemia associated with type 2 diabetes mellitus (HCC) 10/31/2020   Overweight (BMI 25.0-29.9) 10/31/2020   Type 2 diabetes mellitus without complication, without long-term current use of insulin  (HCC) 07/31/2020   OSA (obstructive sleep apnea) 02/02/2020   Spondylosis without myelopathy or radiculopathy, lumbar region 09/17/2017   Essential hypertension 09/05/2017   Chronic bilateral low back pain with left-sided sciatica 09/05/2017   HTN (hypertension) 05/05/2017   Hypothyroidism 02/24/2017   Mixed hyperlipidemia  02/24/2017   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   Sleep related headaches 01/23/2017   Snoring 01/23/2017   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   Chronic headaches 10/30/2016   Obesity (BMI 30.0-34.9) 05/24/2016   PMB (postmenopausal bleeding) 05/24/2016   Back pain of lumbar region with sciatica 09/28/2015   Past Medical History:  Diagnosis Date   Anxiety    Arthritis    Depression    Diabetes mellitus without complication (HCC)    GERD (gastroesophageal reflux disease)    Hyperlipidemia    Hypertension    Hypothyroidism    Migraines    Thyroid  disease    Family History  Problem Relation Age of Onset   Headache Son    Colon cancer Neg Hx    Esophageal cancer Neg Hx    Rectal cancer Neg Hx    Stomach cancer Neg Hx    Breast cancer Neg Hx    Migraines Neg Hx    Past Surgical History:  Procedure Laterality Date   ANTERIOR CERVICAL DECOMP/DISCECTOMY FUSION N/A 03/11/2023   Procedure: C4-5, C5-6, C6-7 ANTERIOR CERVICAL DECOMPRESSION/DISCECTOMY FUSION 3 LEVELS;  Surgeon: Georgina Ozell LABOR, MD;  Location: [facility] OR;  Service: Orthopedics;  Laterality: N/A;   BICEPS TENDON REPAIR Right 01/29/2024   Procedure: REPAIR, TENDON, BICEPS, PROXIMAL;  Surgeon: Addie Cordella Hamilton, MD;  Location: [facility] OR;  Service: Orthopedics;  Laterality: Right;   BREAST CYST EXCISION Right 1980   BREAST CYST EXCISION Left 1985   EXAM UNDER ANESTHESIA WITH MANIPULATION OF SHOULDER Right 01/29/2024   Procedure: EXAM UNDER ANESTHESIA, SHOULDER, WITH MANIPULATION;  Surgeon: Addie Cordella Hamilton, MD;  Location: [facility] OR;  Service: Orthopedics;  Laterality: Right;  RIGHT SHOULDER MUA with ARTHROSCOPC ROTATOR INTERVAL RELEASE AND POSSIBLE MINI OPEN BICEP TENODESIS   NO PAST SURGERIES     POSTERIOR LUMBAR FUSION 2 WITH HARDWARE REMOVAL Right 01/29/2024   Procedure: ARTHROSCOPY, SHOULDER WITH DEBRIDEMENT;  Surgeon: Addie Cordella Hamilton, MD;  Location: [facility] OR;  Service: Orthopedics;  Laterality: Right;  RIGHT SHOULDER  MUA with ARTHROSCOPC ROTATOR INTERVAL RELEASE AND POSSIBLE MINI OPEN BICEP TENODESIS   Social History   Occupational History   Occupation: Unemployed  Tobacco Use   Smoking status: Never   Smokeless tobacco: Never  Vaping Use   Vaping status: Never Used  Substance and Sexual Activity   Alcohol use: No    Alcohol/week: 0.0 standard drinks of alcohol   Drug use: No   Sexual activity: Not Currently    Partners: Male    Shakiyah Cirilo Estela) Arlinda, M.D. [facility], Hand Surgery

## 2024-06-22 ENCOUNTER — Ambulatory Visit: Admitting: Orthopedic Surgery

## 2024-06-23 ENCOUNTER — Encounter: Payer: Self-pay | Admitting: Physical Medicine and Rehabilitation

## 2024-06-24 ENCOUNTER — Encounter: Payer: Self-pay | Admitting: Orthopedic Surgery

## 2024-06-30 ENCOUNTER — Encounter: Payer: Self-pay | Admitting: Orthopedic Surgery

## 2024-06-30 ENCOUNTER — Other Ambulatory Visit: Payer: Self-pay | Admitting: Surgical

## 2024-06-30 ENCOUNTER — Other Ambulatory Visit: Payer: Self-pay | Admitting: Internal Medicine

## 2024-06-30 DIAGNOSIS — E119 Type 2 diabetes mellitus without complications: Secondary | ICD-10-CM

## 2024-07-01 ENCOUNTER — Encounter: Payer: Self-pay | Admitting: Orthopedic Surgery

## 2024-07-01 NOTE — Telephone Encounter (Signed)
 Requested medications are due for refill today.  yes  Requested medications are on the active medications list.  yes  Last refill. 11/10/2023 #100 6 rf  Future visit scheduled.   yes  Notes to clinic.  Protocol will not attach    Requested Prescriptions  Pending Prescriptions Disp Refills   ACCU-CHEK GUIDE TEST test strip [Pharmacy Med Name: Accu-Chek Guide test strips] 100 strip 6    Sig: USE TO TEST THREE TIMES DAILY     There is no refill protocol information for this order

## 2024-07-02 NOTE — Telephone Encounter (Signed)
 Called pt and LMOM to continue trying the meloxicam  and if she is still having pain to cb and schedule an appointment

## 2024-07-04 ENCOUNTER — Encounter: Payer: Self-pay | Admitting: Orthopedic Surgery

## 2024-07-06 ENCOUNTER — Encounter: Payer: Self-pay | Admitting: Physical Medicine and Rehabilitation

## 2024-07-06 NOTE — Telephone Encounter (Signed)
 LMOM to have pt cb to schedule appointment next week.

## 2024-07-06 NOTE — Telephone Encounter (Signed)
 Sent email with exercises provided via Nate

## 2024-07-12 ENCOUNTER — Ambulatory Visit: Admitting: Orthopedic Surgery

## 2024-07-12 ENCOUNTER — Other Ambulatory Visit (INDEPENDENT_AMBULATORY_CARE_PROVIDER_SITE_OTHER): Payer: Self-pay

## 2024-07-12 DIAGNOSIS — S62324A Displaced fracture of shaft of fourth metacarpal bone, right hand, initial encounter for closed fracture: Secondary | ICD-10-CM | POA: Diagnosis not present

## 2024-07-12 DIAGNOSIS — S62322A Displaced fracture of shaft of third metacarpal bone, right hand, initial encounter for closed fracture: Secondary | ICD-10-CM

## 2024-07-12 DIAGNOSIS — F413 Other mixed anxiety disorders: Secondary | ICD-10-CM | POA: Diagnosis not present

## 2024-07-12 DIAGNOSIS — F3181 Bipolar II disorder: Secondary | ICD-10-CM | POA: Diagnosis not present

## 2024-07-12 NOTE — Progress Notes (Signed)
 [name] - 63 y.o. female MRN [id number]  Date of birth: January 02, 1961  Office Visit Note: Visit Date: 07/12/2024 PCP: Vicci Barnie NOVAK, MD Referred by: Vicci Barnie NOVAK, MD  Subjective: No chief complaint on file.  HPI: [name] is a pleasant 63 y.o. female who returns today for follow up for nondisplaced metacarpal fracture of the index finger as well as oblique fractures of the long and ring finger metacarpal shafts.  She is having ongoing stiffness.  Date of injury was 05/21/2024.  Pertinent ROS were reviewed with the patient and found to be negative unless otherwise specified above in HPI.     Assessment & Plan: Visit Diagnoses:  1. Closed displaced fracture of shaft of third metacarpal bone of right hand, initial encounter   2. Closed displaced fracture of shaft of fourth metacarpal bone of right hand, initial encounter     Plan: She continues to do well with nonoperative care.  There remains no significant interval displacement from her prior x-rays.  There is evidence of radiographic and clinical healing.  She does not have any clinical rotational abnormalities on examination today.  We will place referral today to occupational therapy for range of motion exercises given her ongoing stiffness.  Follow-up: No follow-ups on file.   Meds & Orders: No orders of the defined types were placed in this encounter.   Orders Placed This Encounter  Procedures   XR Hand Complete Right   Ambulatory referral to Occupational Therapy     Procedures: No procedures performed      Clinical History: MRI CERVICAL SPINE WITHOUT CONTRAST   TECHNIQUE: Multiplanar, multisequence MR imaging of the cervical spine was performed. No intravenous contrast was administered.   COMPARISON:  None Available.   FINDINGS: Alignment: Physiologic.   Vertebrae: No acute fracture, evidence of discitis, or aggressive bone lesion.   Cord: Normal signal and morphology.   Posterior  Fossa, vertebral arteries, paraspinal tissues: Posterior fossa demonstrates no focal abnormality. Vertebral artery flow voids are maintained. Paraspinal soft tissues are unremarkable.   Disc levels:   Discs: Degenerative disease with disc height loss at C4-5, C5-6 and C6-7.   C2-3: No disc protrusion. Mild right facet arthropathy. Moderate-severe right foraminal stenosis. No left foraminal stenosis. No spinal stenosis.   C3-4: Mild broad-based disc bulge. Moderate left and mild right facet arthropathy. Moderate-severe left foraminal stenosis. Mild right foraminal stenosis. No spinal stenosis.   C4-5: Broad left paracentral disc protrusion contacting the left ventral paracentral cervical spinal cord. Moderate left foraminal stenosis. Mild right foraminal stenosis. Mild spinal stenosis.   C5-6: Mild broad-based disc bulge. Bilateral uncovertebral degenerative changes. Severe bilateral foraminal stenosis. Mild spinal stenosis.   C6-7: Broad-based disc bulge. Mild-moderate left foraminal stenosis. Mild right foraminal stenosis. No spinal stenosis.   C7-T1: Small central disc protrusion. No foraminal or central canal stenosis.   IMPRESSION: 1. Cervical spine spondylosis as described above. 2. No acute osseous injury of the cervical spine.     Electronically Signed   By: Julaine Blanch M.D.   On: 12/25/2022 09:13  She reports that she has never smoked. She has never used smokeless tobacco.  Recent Labs    11/17/23 1642 01/27/24 0844 05/11/24 1516  HGBA1C 6.6 5.5 6.9    Objective:   Vital Signs: LMP 06/06/2015   Physical Exam  Gen: Well-appearing, in no acute distress; non-toxic CV: Regular Rate. Well-perfused. Warm.  Resp: Breathing unlabored on room air; no wheezing. Psych: Fluid speech in  conversation; appropriate affect; normal thought process  Ortho Exam Right hand: - Moderate swelling throughout the dorsal aspect of the hand and digits, digital range of motion  is limited secondary to pain - Able to initiate composite fist without notable digital overlap, appropriate cascade is appreciated of all digits without significant rotational abnormality - Sensation intact distally in all distributions median/radial/ulnar, AIN/PIN/interosseous intact - Hand remains warm well-perfused  Imaging: XR Hand Complete Right Result Date: 07/12/2024 X-rays of the right hand demonstrate interval healing of long and ring finger metacarpal fractures, oblique in nature with notable callus formation on multiple views     Past Medical/Family/Surgical/Social History: Medications & Allergies reviewed per EMR, new medications updated. 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   Brain atrophy 12/18/2022   Complete tear of right rotator cuff 05/16/2022   Adhesive capsulitis of right shoulder 03/14/2022   Influenza vaccine needed 10/31/2020   Need for diphtheria-tetanus-pertussis (Tdap) vaccine 10/31/2020   Hyperlipidemia associated with type 2 diabetes mellitus (HCC) 10/31/2020   Overweight (BMI 25.0-29.9) 10/31/2020   Type 2 diabetes mellitus without complication, without long-term current use of insulin  (HCC) 07/31/2020   OSA (obstructive sleep apnea) 02/02/2020   Spondylosis without myelopathy or radiculopathy, lumbar region 09/17/2017   Essential hypertension 09/05/2017   Chronic bilateral low back pain with left-sided sciatica 09/05/2017   HTN (hypertension) 05/05/2017   Hypothyroidism 02/24/2017   Mixed hyperlipidemia 02/24/2017   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   Sleep related headaches 01/23/2017   Snoring 01/23/2017   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   Chronic headaches 10/30/2016   Obesity (BMI 30.0-34.9) 05/24/2016   PMB  (postmenopausal bleeding) 05/24/2016   Back pain of lumbar region with sciatica 09/28/2015   Past Medical History:  Diagnosis Date   Anxiety    Arthritis    Depression    Diabetes mellitus without complication (HCC)    GERD (gastroesophageal reflux disease)    Hyperlipidemia    Hypertension    Hypothyroidism    Migraines    Thyroid  disease    Family History  Problem Relation Age of Onset   Headache Son    Colon cancer Neg Hx    Esophageal cancer Neg Hx    Rectal cancer Neg Hx    Stomach cancer Neg Hx    Breast cancer Neg Hx    Migraines Neg Hx    Past Surgical History:  Procedure Laterality Date   ANTERIOR CERVICAL DECOMP/DISCECTOMY FUSION N/A 03/11/2023   Procedure: C4-5, C5-6, C6-7 ANTERIOR CERVICAL DECOMPRESSION/DISCECTOMY FUSION 3 LEVELS;  Surgeon: Georgina Ozell LABOR, MD;  Location: [facility] OR;  Service: Orthopedics;  Laterality: N/A;   BICEPS TENDON REPAIR Right 01/29/2024   Procedure: REPAIR, TENDON, BICEPS, PROXIMAL;  Surgeon: Addie Cordella Hamilton, MD;  Location: [facility] OR;  Service: Orthopedics;  Laterality: Right;   BREAST CYST EXCISION Right 1980   BREAST CYST EXCISION Left 1985   EXAM UNDER ANESTHESIA WITH MANIPULATION OF SHOULDER Right 01/29/2024   Procedure: EXAM UNDER ANESTHESIA, SHOULDER, WITH MANIPULATION;  Surgeon: Addie Cordella Hamilton, MD;  Location: [facility] OR;  Service: Orthopedics;  Laterality: Right;  RIGHT SHOULDER MUA with ARTHROSCOPC ROTATOR INTERVAL RELEASE AND POSSIBLE MINI OPEN BICEP TENODESIS   NO PAST SURGERIES     POSTERIOR LUMBAR FUSION 2 WITH HARDWARE REMOVAL Right 01/29/2024   Procedure: ARTHROSCOPY, SHOULDER  WITH DEBRIDEMENT;  Surgeon: Addie Cordella Hamilton, MD;  Location: [facility] OR;  Service: Orthopedics;  Laterality: Right;  RIGHT SHOULDER MUA with ARTHROSCOPC ROTATOR INTERVAL RELEASE AND POSSIBLE MINI OPEN BICEP TENODESIS   Social History   Occupational History   Occupation: Unemployed  Tobacco Use   Smoking status: Never   Smokeless tobacco: Never  Vaping Use    Vaping status: Never Used  Substance and Sexual Activity   Alcohol use: No    Alcohol/week: 0.0 standard drinks of alcohol   Drug use: No   Sexual activity: Not Currently    Partners: Male    Daisja Kessinger Estela) Arlinda, M.D. [facility], Hand Surgery

## 2024-07-14 DIAGNOSIS — F330 Major depressive disorder, recurrent, mild: Secondary | ICD-10-CM | POA: Diagnosis not present

## 2024-07-15 ENCOUNTER — Encounter: Admitting: Physical Medicine and Rehabilitation

## 2024-07-16 ENCOUNTER — Other Ambulatory Visit: Payer: Self-pay | Admitting: Internal Medicine

## 2024-07-19 ENCOUNTER — Other Ambulatory Visit: Payer: Self-pay | Admitting: Internal Medicine

## 2024-07-19 DIAGNOSIS — E1169 Type 2 diabetes mellitus with other specified complication: Secondary | ICD-10-CM

## 2024-07-20 ENCOUNTER — Encounter: Payer: Self-pay | Admitting: Orthopedic Surgery

## 2024-07-30 ENCOUNTER — Encounter: Payer: Self-pay | Admitting: Physical Medicine and Rehabilitation

## 2024-07-30 ENCOUNTER — Other Ambulatory Visit: Payer: Self-pay

## 2024-07-30 ENCOUNTER — Ambulatory Visit: Admitting: Physical Medicine and Rehabilitation

## 2024-07-30 VITALS — BP 115/80 | HR 61

## 2024-07-30 DIAGNOSIS — M5481 Occipital neuralgia: Secondary | ICD-10-CM

## 2024-07-30 MED ORDER — BUPIVACAINE HCL 0.25 % IJ SOLN
2.0000 mL | Freq: Once | INTRAMUSCULAR | Status: AC
  Administered 2024-07-30: 2 mL

## 2024-07-30 NOTE — Progress Notes (Signed)
 Pain Scale   Average Pain 7 Patient advising she has chronic neck pain radiating to both shoulders. Pain is constant without relief # Pain Diary        +Driver, -BT, -Dye Allergies.

## 2024-08-02 ENCOUNTER — Other Ambulatory Visit: Payer: Self-pay | Admitting: Physical Medicine and Rehabilitation

## 2024-08-02 DIAGNOSIS — M5481 Occipital neuralgia: Secondary | ICD-10-CM

## 2024-08-03 ENCOUNTER — Encounter: Payer: Self-pay | Admitting: Physical Medicine and Rehabilitation

## 2024-08-03 NOTE — Procedures (Signed)
 Diagnostic Cervical Facet Joint Nerve Block with Fluoroscopic Guidance  Patient: [name]      Date of Birth: 07-27-1961 MRN: [id number] PCP: Vicci Barnie NOVAK, MD      Visit Date: 07/30/2024   Universal Protocol:    Date/Time: 10/14/254:10 PM  Consent Given By: the patient  Position: PRONE  Additional Comments: Vital signs were monitored before and after the procedure. Patient was prepped and draped in the usual sterile fashion. The correct patient, procedure, and site was verified.   Injection Procedure Details:   Procedure diagnoses: Bilateral occipital neuralgia [M54.81]   Meds Administered:  Meds ordered this encounter  Medications   bupivacaine  (MARCAINE ) 0.25 % (with pres) injection 2 mL     Laterality: Bilateral  Location/Site:  C2-3  Needle size: 25 G  Needle type: Spinal  Needle Placement: Articular Pillar  Findings:  -Contrast Used: 0.5 mL iohexol 180 mg iodine /mL   -Comments: Excellent flow of contrast across the articular pillars without intravascular flow  Procedure Details: The fluoroscope beam was positioned to square off the endplates of the desired vertebral level to achieve a true AP position. The beam was then moved in a small "counter" oblique to the contralateral side with a small amount of caudal tilt to achieve a trajectory alignment with the desired nerves.  For each target described below the skin was anesthetized with 1 ml of 1% Lidocaine  without epinephrine .   To block the facet joint nerve to C2, the needle was fluoroscopically positioned over the inferior lateral portion of the C2/3 facet joint nerve where the third occipital nerve (TON) lies.  The needle was then walked off until it rested just lateral to the trough of the lateral mass of the medial branch nerve, which innervates the cervical facet joint.   After contact with periosteum and negative aspirate for blood and CSF, correct placement without intravascular or  epidural spread was confirmed by Bi-planar images and  injecting 0.5 ml. of Omnipaque-240.  A spot radiograph was obtained of this image.  Next, a 0.5 ml. volume of 1% Lidocaine  without Epinephrine  was then injected.  Prior to the procedure, the patient was given a Pain Diary which was completed for baseline measurements.  After the procedure, the patient rated their pain every 30 minutes and will continue rating at this frequency for a total of 5 hours.  The patient has been asked to complete the Diary and return to us  by mail, fax or hand delivered as soon as possible.   Additional Comments:  The patient tolerated the procedure well Dressing: Band-Aid    Post-procedure details: Patient was observed during the procedure. Post-procedure instructions were reviewed.  Patient left the clinic in stable condition.

## 2024-08-03 NOTE — Progress Notes (Signed)
 RASHAUNDA RAHL - 63 y.o. female MRN [id number]  Date of birth: 11/09/1960  Office Visit Note: Visit Date: 07/30/2024 PCP: Vicci Barnie NOVAK, MD Referred by: Vicci Barnie NOVAK, MD  Subjective: Chief Complaint  Patient presents with   Neck - Pain   HPI:  [name] is a 63 y.o. female who comes in today at the request of Duwaine Pouch, FNP for planned Bilateral  C2-3 Cervical facet/medial branch block with fluoroscopic guidance.  The patient has failed conservative care including home exercise, medications, time and activity modification.  This injection will be diagnostic and hopefully therapeutic.  Please see requesting physician notes for further details and justification.  Exam has shown concordant pain with facet joint loading.   ROS Otherwise per HPI.  Assessment & Plan: Visit Diagnoses:    ICD-10-CM   1. Bilateral occipital neuralgia  M54.81 XR C-ARM NO REPORT    Nerve Block    bupivacaine  (MARCAINE ) 0.25 % (with pres) injection 2 mL      Plan: No additional findings.   Meds & Orders:  Meds ordered this encounter  Medications   bupivacaine  (MARCAINE ) 0.25 % (with pres) injection 2 mL    Orders Placed This Encounter  Procedures   Nerve Block   XR C-ARM NO REPORT    Follow-up: Return for Review Pain Diary.   Procedures: No procedures performed  Diagnostic Cervical Facet Joint Nerve Block with Fluoroscopic Guidance  Patient: [name]      Date of Birth: 11/03/1960 MRN: [id number] PCP: Vicci Barnie NOVAK, MD      Visit Date: 07/30/2024   Universal Protocol:    Date/Time: 10/14/254:10 PM  Consent Given By: the patient  Position: PRONE  Additional Comments: Vital signs were monitored before and after the procedure. Patient was prepped and draped in the usual sterile fashion. The correct patient, procedure, and site was verified.   Injection Procedure Details:   Procedure diagnoses: Bilateral occipital neuralgia [M54.81]   Meds  Administered:  Meds ordered this encounter  Medications   bupivacaine  (MARCAINE ) 0.25 % (with pres) injection 2 mL     Laterality: Bilateral  Location/Site:  C2-3  Needle size: 25 G  Needle type: Spinal  Needle Placement: Articular Pillar  Findings:  -Contrast Used: 0.5 mL iohexol 180 mg iodine /mL   -Comments: Excellent flow of contrast across the articular pillars without intravascular flow  Procedure Details: The fluoroscope beam was positioned to square off the endplates of the desired vertebral level to achieve a true AP position. The beam was then moved in a small "counter" oblique to the contralateral side with a small amount of caudal tilt to achieve a trajectory alignment with the desired nerves.  For each target described below the skin was anesthetized with 1 ml of 1% Lidocaine  without epinephrine .   To block the facet joint nerve to C2, the needle was fluoroscopically positioned over the inferior lateral portion of the C2/3 facet joint nerve where the third occipital nerve (TON) lies.  The needle was then walked off until it rested just lateral to the trough of the lateral mass of the medial branch nerve, which innervates the cervical facet joint.   After contact with periosteum and negative aspirate for blood and CSF, correct placement without intravascular or epidural spread was confirmed by Bi-planar images and  injecting 0.5 ml. of Omnipaque-240.  A spot radiograph was obtained of this image.  Next, a 0.5 ml. volume of 1% Lidocaine  without Epinephrine  was then injected.  Prior to the procedure, the patient was given a Pain Diary which was completed for baseline measurements.  After the procedure, the patient rated their pain every 30 minutes and will continue rating at this frequency for a total of 5 hours.  The patient has been asked to complete the Diary and return to us  by mail, fax or hand delivered as soon as possible.   Additional Comments:  The patient  tolerated the procedure well Dressing: Band-Aid    Post-procedure details: Patient was observed during the procedure. Post-procedure instructions were reviewed.  Patient left the clinic in stable condition.       Clinical History: MRI CERVICAL SPINE WITHOUT CONTRAST   TECHNIQUE: Multiplanar, multisequence MR imaging of the cervical spine was performed. No intravenous contrast was administered.   COMPARISON:  None Available.   FINDINGS: Alignment: Physiologic.   Vertebrae: No acute fracture, evidence of discitis, or aggressive bone lesion.   Cord: Normal signal and morphology.   Posterior Fossa, vertebral arteries, paraspinal tissues: Posterior fossa demonstrates no focal abnormality. Vertebral artery flow voids are maintained. Paraspinal soft tissues are unremarkable.   Disc levels:   Discs: Degenerative disease with disc height loss at C4-5, C5-6 and C6-7.   C2-3: No disc protrusion. Mild right facet arthropathy. Moderate-severe right foraminal stenosis. No left foraminal stenosis. No spinal stenosis.   C3-4: Mild broad-based disc bulge. Moderate left and mild right facet arthropathy. Moderate-severe left foraminal stenosis. Mild right foraminal stenosis. No spinal stenosis.   C4-5: Broad left paracentral disc protrusion contacting the left ventral paracentral cervical spinal cord. Moderate left foraminal stenosis. Mild right foraminal stenosis. Mild spinal stenosis.   C5-6: Mild broad-based disc bulge. Bilateral uncovertebral degenerative changes. Severe bilateral foraminal stenosis. Mild spinal stenosis.   C6-7: Broad-based disc bulge. Mild-moderate left foraminal stenosis. Mild right foraminal stenosis. No spinal stenosis.   C7-T1: Small central disc protrusion. No foraminal or central canal stenosis.   IMPRESSION: 1. Cervical spine spondylosis as described above. 2. No acute osseous injury of the cervical spine.     Electronically Signed   By:  Julaine Blanch M.D.   On: 12/25/2022 09:13     Objective:  VS:  HT:    WT:   BMI:     BP:115/80  HR:61bpm  TEMP: ( )  RESP:  Physical Exam Vitals and nursing note reviewed.  Constitutional:      General: She is not in acute distress.    Appearance: Normal appearance. She is not ill-appearing.  HENT:     Head: Normocephalic and atraumatic.     Right Ear: External ear normal.     Left Ear: External ear normal.  Eyes:     Extraocular Movements: Extraocular movements intact.  Cardiovascular:     Rate and Rhythm: Normal rate.     Pulses: Normal pulses.  Musculoskeletal:     Cervical back: Tenderness present. No rigidity.     Right lower leg: No edema.     Left lower leg: No edema.     Comments: Patient has good strength in the upper extremities including 5 out of 5 strength in wrist extension long finger flexion and APB.  There is no atrophy of the hands intrinsically.  There is a negative Hoffmann's test.   Lymphadenopathy:     Cervical: No cervical adenopathy.  Skin:    Findings: No erythema, lesion or rash.  Neurological:     General: No focal deficit present.     Mental Status: She is alert  and oriented to person, place, and time.     Sensory: No sensory deficit.     Motor: No weakness or abnormal muscle tone.     Coordination: Coordination normal.  Psychiatric:        Mood and Affect: Mood normal.        Behavior: Behavior normal.      Imaging: No results found.

## 2024-08-05 ENCOUNTER — Other Ambulatory Visit: Payer: Self-pay | Admitting: Podiatry

## 2024-08-06 LAB — HEPATIC FUNCTION PANEL
ALT: 20 IU/L (ref 0–32)
AST: 12 IU/L (ref 0–40)
Albumin: 4.6 g/dL (ref 3.9–4.9)
Alkaline Phosphatase: 143 IU/L — ABNORMAL HIGH (ref 49–135)
Bilirubin Total: 0.5 mg/dL (ref 0.0–1.2)
Bilirubin, Direct: 0.14 mg/dL (ref 0.00–0.40)
Total Protein: 8.2 g/dL (ref 6.0–8.5)

## 2024-08-08 ENCOUNTER — Encounter: Payer: Self-pay | Admitting: Internal Medicine

## 2024-08-09 ENCOUNTER — Other Ambulatory Visit: Payer: Self-pay | Admitting: Orthopedic Surgery

## 2024-08-09 ENCOUNTER — Other Ambulatory Visit: Payer: Self-pay | Admitting: Internal Medicine

## 2024-08-09 DIAGNOSIS — E119 Type 2 diabetes mellitus without complications: Secondary | ICD-10-CM

## 2024-08-11 DIAGNOSIS — F330 Major depressive disorder, recurrent, mild: Secondary | ICD-10-CM | POA: Diagnosis not present

## 2024-08-13 ENCOUNTER — Ambulatory Visit: Attending: Orthopedic Surgery

## 2024-08-13 DIAGNOSIS — R29898 Other symptoms and signs involving the musculoskeletal system: Secondary | ICD-10-CM | POA: Insufficient documentation

## 2024-08-13 DIAGNOSIS — S62324A Displaced fracture of shaft of fourth metacarpal bone, right hand, initial encounter for closed fracture: Secondary | ICD-10-CM | POA: Insufficient documentation

## 2024-08-13 DIAGNOSIS — M25641 Stiffness of right hand, not elsewhere classified: Secondary | ICD-10-CM | POA: Diagnosis present

## 2024-08-13 DIAGNOSIS — M79641 Pain in right hand: Secondary | ICD-10-CM | POA: Insufficient documentation

## 2024-08-13 DIAGNOSIS — S62322A Displaced fracture of shaft of third metacarpal bone, right hand, initial encounter for closed fracture: Secondary | ICD-10-CM | POA: Insufficient documentation

## 2024-08-13 DIAGNOSIS — M6281 Muscle weakness (generalized): Secondary | ICD-10-CM | POA: Insufficient documentation

## 2024-08-13 DIAGNOSIS — R278 Other lack of coordination: Secondary | ICD-10-CM | POA: Insufficient documentation

## 2024-08-13 NOTE — Patient Instructions (Addendum)
Swelling Reduction  Place affected area above the level of your heart. You can use pillows for support.  Move the affected area. Think about tightening the muscles in this area and then relaxing them.  "Pet" the area from the outside of your body toward the center of your body. If it is your hand, from the tips of your fingers to the shoulder or chest.    

## 2024-08-13 NOTE — Therapy (Signed)
 OUTPATIENT OCCUPATIONAL THERAPY ORTHO EVALUATION  Patient Name: [name] MRN: [id number] DOB:18-Feb-1961, 63 y.o., female Today's Date: 08/13/2024  PCP: Vicci Barnie NOVAK, MD REFERRING PROVIDER: Arlinda Buster, MD  END OF SESSION:   Past Medical History:  Diagnosis Date   Anxiety    Arthritis    Depression    Diabetes mellitus without complication (HCC)    GERD (gastroesophageal reflux disease)    Hyperlipidemia    Hypertension    Hypothyroidism    Migraines    Thyroid  disease    Past Surgical History:  Procedure Laterality Date   ANTERIOR CERVICAL DECOMP/DISCECTOMY FUSION N/A 03/11/2023   Procedure: C4-5, C5-6, C6-7 ANTERIOR CERVICAL DECOMPRESSION/DISCECTOMY FUSION 3 LEVELS;  Surgeon: Georgina Ozell LABOR, MD;  Location: [facility] OR;  Service: Orthopedics;  Laterality: N/A;   BICEPS TENDON REPAIR Right 01/29/2024   Procedure: REPAIR, TENDON, BICEPS, PROXIMAL;  Surgeon: Addie Cordella Hamilton, MD;  Location: [facility] OR;  Service: Orthopedics;  Laterality: Right;   BREAST CYST EXCISION Right 1980   BREAST CYST EXCISION Left 1985   EXAM UNDER ANESTHESIA WITH MANIPULATION OF SHOULDER Right 01/29/2024   Procedure: EXAM UNDER ANESTHESIA, SHOULDER, WITH MANIPULATION;  Surgeon: Addie Cordella Hamilton, MD;  Location: [facility] OR;  Service: Orthopedics;  Laterality: Right;  RIGHT SHOULDER MUA with ARTHROSCOPC ROTATOR INTERVAL RELEASE AND POSSIBLE MINI OPEN BICEP TENODESIS   NO PAST SURGERIES     POSTERIOR LUMBAR FUSION 2 WITH HARDWARE REMOVAL Right 01/29/2024   Procedure: ARTHROSCOPY, SHOULDER WITH DEBRIDEMENT;  Surgeon: Addie Cordella Hamilton, MD;  Location: [facility] OR;  Service: Orthopedics;  Laterality: Right;  RIGHT SHOULDER MUA with ARTHROSCOPC ROTATOR INTERVAL RELEASE AND POSSIBLE MINI OPEN BICEP TENODESIS   Patient Active Problem List   Diagnosis Date Noted   Biceps tendonitis, right 01/31/2024   Degenerative superior labral anterior-to-posterior (SLAP) tear of right shoulder 01/31/2024   Synovitis of  right shoulder 01/31/2024   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   Brain atrophy 12/18/2022   Complete tear of right rotator cuff 05/16/2022   Adhesive capsulitis of right shoulder 03/14/2022   Influenza vaccine needed 10/31/2020   Need for diphtheria-tetanus-pertussis (Tdap) vaccine 10/31/2020   Hyperlipidemia associated with type 2 diabetes mellitus (HCC) 10/31/2020   Overweight (BMI 25.0-29.9) 10/31/2020   Type 2 diabetes mellitus without complication, without long-term current use of insulin  (HCC) 07/31/2020   OSA (obstructive sleep apnea) 02/02/2020   Spondylosis without myelopathy or radiculopathy, lumbar region 09/17/2017   Essential hypertension 09/05/2017   Chronic bilateral low back pain with left-sided sciatica 09/05/2017   HTN (hypertension) 05/05/2017   Hypothyroidism 02/24/2017   Mixed hyperlipidemia 02/24/2017   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   Sleep related headaches 01/23/2017   Snoring 01/23/2017   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   Chronic headaches 10/30/2016   Obesity (BMI 30.0-34.9) 05/24/2016   PMB (postmenopausal bleeding) 05/24/2016   Back pain of lumbar region with sciatica 09/28/2015    ONSET DATE: 07/12/2024 referral date, date of injury 05/21/24. Initial visit with Dr Erwin 06/01/24, was casted that date.    REFERRING DIAG: D37.677J (ICD-10-CM) - Closed displaced fracture of shaft of third metacarpal bone of right hand, initial encounter S62.324A (ICD-10-CM) - Closed displaced fracture of shaft of fourth metacarpal bone of right hand, initial encounter  Additional note from referral: Needs OT, s/p 2nd, 3rd, and 4th right metacarpal fx ROM  REFER TO IHP VOL 1 PG    THERAPY DIAG:  No diagnosis found.  Rationale for Evaluation and Treatment: Rehabilitation  SUBJECTIVE:   SUBJECTIVE  STATEMENT: Doing okay, pt reports some difficulty with bilateral tasks like cooking and tying shoes. Denies pain at this time. Pt accompanied by: family member son  who waited in lobby  PERTINENT HISTORY: Pt has PMH of SLAP tear at R shoulder, HLD with DM2, HTN, R RTC tear, OSA, major depressive disorder, panic attacks.  PRECAUTIONS: None  RED FLAGS: None   WEIGHT BEARING RESTRICTIONS: No  PAIN:  Are you having pain? No  FALLS: Has patient fallen in last 6 months? Yes. Number of falls 2  LIVING ENVIRONMENT: Lives with: lives with their family and lives with their son Lives in: House/apartment Stairs: Yes: External: 2 steps; none, 22 to get to apartment, R side going up rail Has following equipment at home: Grab bars  PLOF: Independent, completed cooking tasks at home, does not drive  PATIENT GOALS: I want to be able to do the stuff I used to do with both hands, like tying my shoes, putting on my jeans, putting on clothes that have buttons on them  NEXT MD VISIT: None at this time  OBJECTIVE:  Note: Objective measures were completed at Evaluation unless otherwise noted.  HAND DOMINANCE: Left  ADLs: WFL  FUNCTIONAL OUTCOME MEASURES: Quick Dash: 68.2/100    UPPER EXTREMITY ROM:   limited gross composite fist,   Active ROM Right eval Left eval  Shoulder flexion    Shoulder abduction    Shoulder adduction    Shoulder extension    Shoulder internal rotation    Shoulder external rotation    Elbow flexion    Elbow extension    Wrist flexion    Wrist extension    Wrist ulnar deviation    Wrist radial deviation    Wrist pronation    Wrist supination    (Blank rows = not tested)  Active ROM Right eval Left eval  Thumb MCP (0-60)    Thumb IP (0-80)    Thumb Radial abd/add (0-55)     Thumb Palmar abd/add (0-45)     Thumb Opposition to Small Finger     Index MCP (0-90)     Index PIP (0-100)     Index DIP (0-70)      Long MCP (0-90)      Long PIP (0-100)      Long DIP (0-70)      Ring MCP (0-90)      Ring PIP (0-100)      Ring DIP (0-70)      Little MCP (0-90)      Little PIP (0-100)      Little DIP (0-70)       (Blank rows = not tested)   UPPER EXTREMITY MMT:     MMT Right eval Left eval  Shoulder flexion    Shoulder abduction    Shoulder adduction    Shoulder extension    Shoulder internal rotation    Shoulder external rotation    Middle trapezius    Lower trapezius    Elbow flexion    Elbow extension    Wrist flexion    Wrist extension    Wrist ulnar deviation    Wrist radial deviation    Wrist pronation    Wrist supination    (Blank rows = not tested)  HAND FUNCTION: Grip strength: Right: 7.2 lbs; Left: 33.7 lbs  COORDINATION: 9 Hole Peg test: Right: 58.56 sec; Left: 38.21 sec Box and Blocks:  Right 14 blocks, Left 23 blocks  SENSATION: Not tested Pt reports  some shaking when she lifts her fingers, but nothing other than that at this time.  EDEMA: some swelling noted above MCPs of R hand  COGNITION: Overall cognitive status: Within functional limits for tasks assessed, some difficulty following multi step instructions like during 9HPT   OBSERVATIONS: limited ROM, grip strength, coordination, and pain in R nondominant hand affecting ability to participate in ADL/IADL and social participation involving bilateral coordination   TREATMENT DATE: 08/13/24                                                                                                                             Pt educated in purpose of OT, goals, and POC. Pt in agremeent at this time. Educated in edema mgmt with min cues for ensuring downward motion, as well as PROM to improve ROM in R hand. Please see pt instructions for detailed handouts and info.   PATIENT EDUCATION: Education details: SEE ABOVE Person educated: Patient Education method: Programmer, multimedia, Demonstration, Verbal cues, and Handouts Education comprehension: verbalized understanding, returned demonstration, verbal cues required, and needs further education  HOME EXERCISE PROGRAM: 08/13/24: Digit PROM (ACCESS CODE [id number])   GOALS: Goals  reviewed with patient? Yes  SHORT TERM GOALS: Target date: 09/12/24  Pt will be independent with HEP for coordination, ROM, and grip strengthening Baseline: Educated in PROM, will be adding on during POC prn Goal status: IN PROGRESS  2.  Pt will demonstrate improved FM coordination in R hand by a reduced time of at least 5 seconds from baseline measurement Baseline: Right: 58.56 sec; Left: 38.21 sec Goal status: INITIAL  3.  Pt will verbalize at least 1 joint protection strategy to reduce risk of re-injury or strain in R hand Baseline: New to OP OT Goal status: IN PROGRESS  4.  Pt will be independent with edema mgmt in R hand Baseline: Educated Goal status: INITIAL  5.  Pt will be able to place at least 19 blocks using right hand with completion of Box and Blocks test.  Baseline: Right 14 blocks, Left 23 blocks Goal status: INITIAL  6.  Pt will be educated in AE for increased ease with tasks such as shoe tying and cooking/meal prep, doing/undoing buttons Baseline: New to OP OT Goal status: INITIAL  LONG TERM GOALS: Target date: 10/12/24  Pt will be able to place at least 24 blocks using right hand with completion of Box and Blocks test.  Baseline: Box and Blocks:  Right 14 blocks, Left 23 blocks Goal status: INITIAL  2.  Patient will demonstrate at least 25% improvement with quick Dash score (reporting 43.2% disability or less) indicating improved functional use of affected extremity.  Baseline: Quick Dash: 68.2/100 Goal status: INITIAL  3.  Pt will demonstrate improved ROM in R hand by being able to form a loose gross composite fist. Baseline: Unable Goal status: INITIAL  4.  Patient will demonstrate at least 30 lbs R grip strength as needed to open jars  and other containers.  Baseline:Right: 7.2 lbs; Left: 33.7 lbs Goal status: INITIAL    ASSESSMENT:  CLINICAL IMPRESSION: Patient is a 63 y.o. female who was seen today for occupational therapy evaluation for fx at  base of 3rd and 4th metacarpal from a fall. Hx includes MDD, DM2, OSA, SLAP tear of R shoulder. Patient currently presents below baseline level of functioning demonstrating functional deficits and impairments as noted below. Pt would benefit from skilled OT services in the outpatient setting to work on impairments as noted below to help pt return to PLOF as able.     PERFORMANCE DEFICITS: in functional skills including ADLs, IADLs, edema, ROM, strength, pain, Fine motor control, and UE functional use, cognitive skills including memory and sequencing, and psychosocial skills including coping strategies, environmental adaptation, and habits.   IMPAIRMENTS: are limiting patient from ADLs, IADLs, rest and sleep, and social participation.   COMORBIDITIES: may have co-morbidities  that affects occupational performance. Patient will benefit from skilled OT to address above impairments and improve overall function.  MODIFICATION OR ASSISTANCE TO COMPLETE EVALUATION: Min-Moderate modification of tasks or assist with assess necessary to complete an evaluation.  OT OCCUPATIONAL PROFILE AND HISTORY: Detailed assessment: Review of records and additional review of physical, cognitive, psychosocial history related to current functional performance.  CLINICAL DECISION MAKING: Moderate - several treatment options, min-mod task modification necessary  REHAB POTENTIAL: Good  EVALUATION COMPLEXITY: Moderate      PLAN:  OT FREQUENCY: 1x/week  OT DURATION: 8 weeks  PLANNED INTERVENTIONS: 97168 OT Re-evaluation, 97535 self care/ADL training, 02889 therapeutic exercise, 97530 therapeutic activity, 97140 manual therapy, 97113 aquatic therapy, 97035 ultrasound, 97018 paraffin, 02960 fluidotherapy, 97010 moist heat, 97010 cryotherapy, 97034 contrast bath, 97760 Orthotic Initial, 97763 Orthotic/Prosthetic subsequent, manual lymph drainage, passive range of motion, energy conservation, coping strategies training,  patient/family education, and DME and/or AE instructions  RECOMMENDED OTHER SERVICES: none  CONSULTED AND AGREED WITH PLAN OF CARE: Patient  PLAN FOR NEXT SESSION: F/u edema mgmt and PROM completion  Add onto PROM HEP  Coordination and grip strengthening AE recommendations prn Obtain specific measurements   Rocky Dutch, OT 08/13/2024, 9:18 AM

## 2024-08-16 ENCOUNTER — Other Ambulatory Visit: Payer: Self-pay | Admitting: Internal Medicine

## 2024-08-16 DIAGNOSIS — K219 Gastro-esophageal reflux disease without esophagitis: Secondary | ICD-10-CM

## 2024-08-16 DIAGNOSIS — I152 Hypertension secondary to endocrine disorders: Secondary | ICD-10-CM

## 2024-08-17 NOTE — Telephone Encounter (Signed)
 Requested Prescriptions  Pending Prescriptions Disp Refills   omeprazole  (PRILOSEC) 20 MG capsule [Pharmacy Med Name: omeprazole  20 mg capsule,delayed release] 90 capsule 0    Sig: Take 1 capsule (20 mg total) by mouth daily.     Gastroenterology: Proton Pump Inhibitors Passed - 08/17/2024  3:59 PM      Passed - Valid encounter within last 12 months    Recent Outpatient Visits           3 months ago Type 2 diabetes mellitus with hyperlipidemia (HCC)   [place] [facility] Dept Of [place]. [facility] Vicci Sober B, MD   9 months ago Type 2 diabetes mellitus with hyperlipidemia [facility])   [place] [facility] - [facility] Dept Of [place]. [facility] Vicci Sober NOVAK, MD   1 year ago Type 2 diabetes mellitus with hyperlipidemia [facility])   [place] [facility] - A Dept Of [place]. [facility] Vicci Sober NOVAK, MD   1 year ago Preoperative evaluation to rule out surgical contraindication   [place] [facility] - [facility] Dept Of [place]. [facility] Vicci Sober NOVAK, MD   1 year ago Type 2 diabetes mellitus with hyperlipidemia [facility])   [place] [facility] - A Dept Of [place]. [facility] Vicci Sober NOVAK, MD       Future Appointments             In 3 weeks Vicci Sober NOVAK, MD [facility] [place] - A Dept Of [place]. [facility] [address]             lisinopril  (ZESTRIL ) 20 MG tablet [Pharmacy Med Name: lisinopril  20 mg tablet] 90 tablet 0    Sig: Take 1 tablet (20 mg total) by mouth daily.     Cardiovascular:  ACE Inhibitors Failed - 08/17/2024  3:59 PM      Failed - Cr in normal range and within 180 days    Creat  Date Value Ref Range Status  09/28/2015 0.77 0.50 - 1.05 mg/dL Final   Creatinine, Ser  Date Value Ref Range Status  01/27/2024 0.87 0.44 - 1.00 mg/dL Final         Failed - K in normal range and within 180 days    Potassium   Date Value Ref Range Status  01/27/2024 4.2 3.5 - 5.1 mmol/L Final    Comment:    HEMOLYSIS AT THIS LEVEL MAY AFFECT RESULT         Passed - Patient is not pregnant      Passed - Last BP in normal range    BP Readings from Last 1 Encounters:  07/30/24 115/80         Passed - Valid encounter within last 6 months    Recent Outpatient Visits           3 months ago Type 2 diabetes mellitus with hyperlipidemia (HCC)   [place] [facility] Dept Of [place]. [facility] Vicci Sober B, MD   9 months ago Type 2 diabetes mellitus with hyperlipidemia [facility])   [place] [facility] Dept Of [place]. [facility] Vicci Sober NOVAK, MD   1 year ago Type 2 diabetes mellitus with hyperlipidemia [facility])   [place] [facility] - A Dept Of [place]. [facility] Vicci Sober  B, MD   1 year ago Preoperative evaluation to rule out surgical contraindication   [place] [facility] - A Dept Of [place]. [facility] Vicci Barnie NOVAK, MD   1 year ago Type 2 diabetes mellitus with hyperlipidemia [facility])   [place] [facility] - A Dept Of [place]. [facility] Vicci Barnie NOVAK, MD       Future Appointments             In 3 weeks Vicci Barnie NOVAK, MD [facility] [place] - A Dept Of [place]. [facility] [place]

## 2024-08-23 ENCOUNTER — Encounter: Payer: Self-pay | Admitting: Radiology

## 2024-08-24 ENCOUNTER — Ambulatory Visit

## 2024-08-27 ENCOUNTER — Ambulatory Visit

## 2024-08-31 ENCOUNTER — Ambulatory Visit

## 2024-09-03 ENCOUNTER — Ambulatory Visit

## 2024-09-07 ENCOUNTER — Ambulatory Visit

## 2024-09-09 DIAGNOSIS — F330 Major depressive disorder, recurrent, mild: Secondary | ICD-10-CM | POA: Diagnosis not present

## 2024-09-10 ENCOUNTER — Ambulatory Visit: Attending: Orthopedic Surgery

## 2024-09-10 DIAGNOSIS — M79641 Pain in right hand: Secondary | ICD-10-CM | POA: Insufficient documentation

## 2024-09-10 DIAGNOSIS — M25641 Stiffness of right hand, not elsewhere classified: Secondary | ICD-10-CM | POA: Diagnosis present

## 2024-09-10 DIAGNOSIS — R278 Other lack of coordination: Secondary | ICD-10-CM | POA: Insufficient documentation

## 2024-09-10 NOTE — Therapy (Signed)
 OUTPATIENT OCCUPATIONAL THERAPY ORTHO TREATMENT  Patient Name: [name] MRN: [id number] DOB:03-Sep-1961, 63 y.o., female Today's Date: 09/10/2024  PCP: Vicci Barnie NOVAK, MD REFERRING PROVIDER: Arlinda Buster, MD  END OF SESSION:  OT End of Session - 09/10/24 1420     Visit Number 2    Number of Visits 9    Date for Recertification  10/12/24    Authorization Type [employer] MCD    OT Start Time 1317    OT Stop Time 1400    OT Time Calculation (min) 43 min    Equipment Utilized During Treatment FM materials    Activity Tolerance Patient tolerated treatment well    Behavior During Therapy WFL for tasks assessed/performed          Past Medical History:  Diagnosis Date   Anxiety    Arthritis    Depression    Diabetes mellitus without complication (HCC)    GERD (gastroesophageal reflux disease)    Hyperlipidemia    Hypertension    Hypothyroidism    Migraines    Thyroid  disease    Past Surgical History:  Procedure Laterality Date   ANTERIOR CERVICAL DECOMP/DISCECTOMY FUSION N/A 03/11/2023   Procedure: C4-5, C5-6, C6-7 ANTERIOR CERVICAL DECOMPRESSION/DISCECTOMY FUSION 3 LEVELS;  Surgeon: Georgina Ozell LABOR, MD;  Location: [facility] OR;  Service: Orthopedics;  Laterality: N/A;   BICEPS TENDON REPAIR Right 01/29/2024   Procedure: REPAIR, TENDON, BICEPS, PROXIMAL;  Surgeon: Addie Cordella Hamilton, MD;  Location: [facility] OR;  Service: Orthopedics;  Laterality: Right;   BREAST CYST EXCISION Right 1980   BREAST CYST EXCISION Left 1985   EXAM UNDER ANESTHESIA WITH MANIPULATION OF SHOULDER Right 01/29/2024   Procedure: EXAM UNDER ANESTHESIA, SHOULDER, WITH MANIPULATION;  Surgeon: Addie Cordella Hamilton, MD;  Location: [facility] OR;  Service: Orthopedics;  Laterality: Right;  RIGHT SHOULDER MUA with ARTHROSCOPC ROTATOR INTERVAL RELEASE AND POSSIBLE MINI OPEN BICEP TENODESIS   NO PAST SURGERIES     POSTERIOR LUMBAR FUSION 2 WITH HARDWARE REMOVAL Right 01/29/2024   Procedure: ARTHROSCOPY, SHOULDER WITH  DEBRIDEMENT;  Surgeon: Addie Cordella Hamilton, MD;  Location: [facility] OR;  Service: Orthopedics;  Laterality: Right;  RIGHT SHOULDER MUA with ARTHROSCOPC ROTATOR INTERVAL RELEASE AND POSSIBLE MINI OPEN BICEP TENODESIS   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   Brain atrophy 12/18/2022   Complete tear of right rotator cuff 05/16/2022   Adhesive capsulitis of right shoulder 03/14/2022   Influenza vaccine needed 10/31/2020   Need for diphtheria-tetanus-pertussis (Tdap) vaccine 10/31/2020   Hyperlipidemia associated with type 2 diabetes mellitus (HCC) 10/31/2020   Overweight (BMI 25.0-29.9) 10/31/2020   Type 2 diabetes mellitus without complication, without long-term current use of insulin  (HCC) 07/31/2020   OSA (obstructive sleep apnea) 02/02/2020   Spondylosis without myelopathy or radiculopathy, lumbar region 09/17/2017   Essential hypertension 09/05/2017   Chronic bilateral low back pain with left-sided sciatica 09/05/2017   HTN (hypertension) 05/05/2017   Hypothyroidism 02/24/2017   Mixed hyperlipidemia 02/24/2017   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   Sleep related headaches 01/23/2017   Snoring 01/23/2017   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   Chronic headaches 10/30/2016   Obesity (BMI 30.0-34.9) 05/24/2016   PMB (postmenopausal bleeding) 05/24/2016   Back pain of lumbar region with sciatica 09/28/2015    ONSET DATE: 07/12/2024 referral date, date of  injury 05/21/24. Initial visit with Dr Erwin 06/01/24, was casted that date.    REFERRING DIAG: D37.677J (ICD-10-CM) - Closed displaced fracture of shaft of third metacarpal bone of right hand, initial encounter S62.324A (ICD-10-CM) - Closed displaced fracture of shaft of fourth metacarpal bone of right hand, initial  encounter  Additional note from referral: Needs OT, s/p 2nd, 3rd, and 4th right metacarpal fx ROM  REFER TO IHP VOL 1 PG 256-257   THERAPY DIAG:  Pain in right hand  Stiffness of right hand, not elsewhere classified  Other lack of coordination  Rationale for Evaluation and Treatment: Rehabilitation  SUBJECTIVE:   SUBJECTIVE STATEMENT: Pt reports she has been completing her stretches but afterwards is sore. Pt accompanied by: family member son   PERTINENT HISTORY: Pt has PMH of SLAP tear at R shoulder, HLD with DM2, HTN, R RTC tear, OSA, major depressive disorder, panic attacks.  PRECAUTIONS: None  RED FLAGS: None   WEIGHT BEARING RESTRICTIONS: No  PAIN:  Are you having pain? No  FALLS: Has patient fallen in last 6 months? Yes. Number of falls 2  LIVING ENVIRONMENT: Lives with: lives with their family and lives with their son Lives in: House/apartment Stairs: Yes: External: 2 steps; none, 22 to get to apartment, R side going up rail Has following equipment at home: Grab bars  PLOF: Independent, completed cooking tasks at home, does not drive  PATIENT GOALS: I want to be able to do the stuff I used to do with both hands, like tying my shoes, putting on my jeans, putting on clothes that have buttons on them  NEXT MD VISIT: None at this time  OBJECTIVE:  Note: Objective measures were completed at Evaluation unless otherwise noted.  HAND DOMINANCE: Left  ADLs: WFL  FUNCTIONAL OUTCOME MEASURES: Quick Dash: 68.2/100    UPPER EXTREMITY ROM:   limited gross composite fist,   Active ROM Right eval Left eval  Shoulder flexion    Shoulder abduction    Shoulder adduction    Shoulder extension    Shoulder internal rotation    Shoulder external rotation    Elbow flexion    Elbow extension    Wrist flexion    Wrist extension    Wrist ulnar deviation    Wrist radial deviation    Wrist pronation    Wrist supination    (Blank rows = not tested)  Active  ROM Right eval Left eval  Thumb MCP (0-60)    Thumb IP (0-80)    Thumb Radial abd/add (0-55)     Thumb Palmar abd/add (0-45)     Thumb Opposition to Small Finger     Index MCP (0-90)     Index PIP (0-100)     Index DIP (0-70)      Long MCP (0-90)      Long PIP (0-100)      Long DIP (0-70)      Ring MCP (0-90)      Ring PIP (0-100)      Ring DIP (0-70)      Little MCP (0-90)      Little PIP (0-100)      Little DIP (0-70)      (Blank rows = not tested)   UPPER EXTREMITY MMT:     MMT Right eval Left eval  Shoulder flexion    Shoulder abduction    Shoulder adduction    Shoulder extension    Shoulder internal rotation    Shoulder external rotation  Middle trapezius    Lower trapezius    Elbow flexion    Elbow extension    Wrist flexion    Wrist extension    Wrist ulnar deviation    Wrist radial deviation    Wrist pronation    Wrist supination    (Blank rows = not tested)  HAND FUNCTION: Grip strength: Right: 7.2 lbs; Left: 33.7 lbs  COORDINATION: 9 Hole Peg test: Right: 58.56 sec; Left: 38.21 sec Box and Blocks:  Right 14 blocks, Left 23 blocks  SENSATION: Not tested Pt reports some shaking when she lifts her fingers, but nothing other than that at this time.  EDEMA: some swelling noted above MCPs of R hand  COGNITION: Overall cognitive status: Within functional limits for tasks assessed, some difficulty following multi step instructions like during 9HPT   OBSERVATIONS: limited ROM, grip strength, coordination, and pain in R nondominant hand affecting ability to participate in ADL/IADL and social participation involving bilateral coordination   TREATMENT DATE: 09/10/24                                                                                                                            Took edema measurements: 7.5 cm PIP of R index finger, 5 cm DIP. Compaired to 6 cm PIP of L index finger and 5 cm DIP. 7 cm PIP of R long finger and 5.5 at DIP, 6.2  cm at L long PIP and 5.5 DIP.   Educated pt in use of heat and cold modalities before and after issued stretches, wrote down safety precautions including no more than 30 minute application at a time and instructed pt to make there is a barrier in place and to not apply source directly to skin.   Pt educated in FM coordination activities to improve coordination and dexterity in the R hand   PATIENT EDUCATION: Education details: SEE ABOVE Person educated: Patient Education method: Programmer, Multimedia, Demonstration, Verbal cues, and Handouts Education comprehension: verbalized understanding, returned demonstration, verbal cues required, and needs further education  HOME EXERCISE PROGRAM: 08/13/24: Digit PROM (ACCESS CODE [id number])   GOALS: Goals reviewed with patient? Yes  SHORT TERM GOALS: Target date: 09/24/24  Pt will be independent with HEP for coordination, ROM, and grip strengthening Baseline: Educated in PROM, will be adding on during POC prn Goal status: IN PROGRESS  2.  Pt will demonstrate improved FM coordination in R hand by a reduced time of at least 5 seconds from baseline measurement Baseline: Right: 58.56 sec; Left: 38.21 sec Goal status: INITIAL  3.  Pt will verbalize at least 1 joint protection strategy to reduce risk of re-injury or strain in R hand Baseline: New to OP OT Goal status: IN PROGRESS  4.  Pt will be independent with edema mgmt in R hand Baseline: Educated Goal status: IN PROGRESS  5.  Pt will be able to place at least 19 blocks using right hand with completion of Box and Blocks test.  Baseline: Right 14 blocks, Left 23 blocks Goal status: IN PROGRESS  6.  Pt will be educated in AE for increased ease with tasks such as shoe tying and cooking/meal prep, doing/undoing buttons Baseline: New to OP OT Goal status: IN PROGRES  LONG TERM GOALS: Target date: 10/12/24  Pt will be able to place at least 24 blocks using right hand with completion of Box and  Blocks test.  Baseline: Box and Blocks:  Right 14 blocks, Left 23 blocks Goal status: INITIAL  2.  Patient will demonstrate at least 25% improvement with quick Dash score (reporting 43.2% disability or less) indicating improved functional use of affected extremity.  Baseline: Quick Dash: 68.2/100 Goal status: IN PROGRESS  3.  Pt will demonstrate improved ROM in R hand by being able to form a loose gross composite fist. Baseline: Unable Goal status: IN PROGRESS  4.  Patient will demonstrate at least 30 lbs R grip strength as needed to open jars and other containers.  Baseline:Right: 7.2 lbs; Left: 33.7 lbs Goal status: INITIAL    ASSESSMENT:  CLINICAL IMPRESSION: Patient is a 63 y.o. female who was seen today for occupational therapy tx for fx at base of 3rd and 4th metacarpal from a fall. Hx includes MDD, DM2, OSA, SLAP tear of R shoulder. Patient currently presents below baseline level of functioning demonstrating functional deficits and impairments as noted below. Pt participating well with recommendations and required some modifications of FM coordination HEP. Good support system with son. Pt would benefit from skilled OT services in the outpatient setting to work on impairments as noted below to help pt return to PLOF as able.     PERFORMANCE DEFICITS: in functional skills including ADLs, IADLs, edema, ROM, strength, pain, Fine motor control, and UE functional use, cognitive skills including memory and sequencing, and psychosocial skills including coping strategies, environmental adaptation, and habits.   IMPAIRMENTS: are limiting patient from ADLs, IADLs, rest and sleep, and social participation.   COMORBIDITIES: may have co-morbidities  that affects occupational performance. Patient will benefit from skilled OT to address above impairments and improve overall function.  MODIFICATION OR ASSISTANCE TO COMPLETE EVALUATION: Min-Moderate modification of tasks or assist with assess  necessary to complete an evaluation.  OT OCCUPATIONAL PROFILE AND HISTORY: Detailed assessment: Review of records and additional review of physical, cognitive, psychosocial history related to current functional performance.  CLINICAL DECISION MAKING: Moderate - several treatment options, min-mod task modification necessary  REHAB POTENTIAL: Good  EVALUATION COMPLEXITY: Moderate      PLAN:  OT FREQUENCY: 1x/week  OT DURATION: 8 weeks  PLANNED INTERVENTIONS: 97168 OT Re-evaluation, 97535 self care/ADL training, 02889 therapeutic exercise, 97530 therapeutic activity, 97140 manual therapy, 97113 aquatic therapy, 97035 ultrasound, 97018 paraffin, 02960 fluidotherapy, 97010 moist heat, 97010 cryotherapy, 97034 contrast bath, 97760 Orthotic Initial, 97763 Orthotic/Prosthetic subsequent, manual lymph drainage, passive range of motion, energy conservation, coping strategies training, patient/family education, and DME and/or AE instructions  RECOMMENDED OTHER SERVICES: none  CONSULTED AND AGREED WITH PLAN OF CARE: Patient  PLAN FOR NEXT SESSION: F/u edema mgmt and PROM completion  Add onto PROM HEP  grip strengthening AE recommendations prn Obtain specific measurements Modalities (fluido)  Rocky Dutch, OT 09/10/2024, 3:43 PM

## 2024-09-10 NOTE — Patient Instructions (Signed)
  Coordination Activities  Perform the following activities for 10-15 minutes 1 times per day with right hand(s).   Rotate ball in fingertips (clockwise and counter-clockwise). Flip cards 1 at a time as fast as you can. Deal cards with your thumb (Hold deck in hand and push card off top with thumb). Pick up coins one at a time until you get 5-10 in your hand, then move coins from palm to fingertips to stack one at a time. Twirl pen between fingers. Screw together nuts and bolts, then unfasten. Tear a tissue into long strips, then wad each strip into a tiny ball.

## 2024-09-13 ENCOUNTER — Ambulatory Visit: Admitting: Internal Medicine

## 2024-09-20 ENCOUNTER — Ambulatory Visit: Admitting: Podiatry

## 2024-09-24 ENCOUNTER — Ambulatory Visit

## 2024-09-26 ENCOUNTER — Encounter: Payer: Self-pay | Admitting: Internal Medicine

## 2024-09-28 ENCOUNTER — Other Ambulatory Visit: Payer: Self-pay | Admitting: Surgical

## 2024-09-29 ENCOUNTER — Other Ambulatory Visit: Payer: Self-pay | Admitting: Surgical

## 2024-09-29 ENCOUNTER — Encounter: Payer: Self-pay | Admitting: Podiatry

## 2024-09-29 ENCOUNTER — Ambulatory Visit: Admitting: Podiatry

## 2024-09-29 DIAGNOSIS — B351 Tinea unguium: Secondary | ICD-10-CM

## 2024-09-29 DIAGNOSIS — M79672 Pain in left foot: Secondary | ICD-10-CM

## 2024-09-29 DIAGNOSIS — M79671 Pain in right foot: Secondary | ICD-10-CM | POA: Diagnosis not present

## 2024-09-29 MED ORDER — TERBINAFINE HCL 250 MG PO TABS: 250.0000 mg | ORAL_TABLET | Freq: Every day | ORAL | 1 refills | Status: AC

## 2024-09-29 MED ORDER — TERBINAFINE HCL 250 MG PO TABS: 250.0000 mg | ORAL_TABLET | Freq: Every day | ORAL | 0 refills | Status: DC

## 2024-09-29 NOTE — Progress Notes (Signed)
° °  Subjective:    HPI Presents with complaint of thick painful nails that causes discomfort with  walking and wearing shoes.  Have been this way for many years and have been getting worse.   Objective:  Physical Exam   General: AAO x3, NAD  Vascular: DP and PT pulses palpable bilaterally.  Immedate capillary fill time digits. No significant lower extremity edema bilaterally.  Dermatological:  Onychomycotic mycotic changes nails 1 through 5 with discoloration nail and subungual debris and thickening of the nail with redness along the nail folds.  Tenderness with pressure on nail plates..  Neruologic:  Grossly intact B/L  Musculoskeletal:  Normal lower extremity muscle strength.  Assessment:  Painful onychomycotic nails 1 through 5 bilaterally. Pain feet b/l     Plan:  - Established office visit level 3 for evaluation and management -Discussed with with patient onychomycosis and etiology and treatment.  Discussed topical versus oral agents.  Discussed risk and benefits of both.  No history of hepatic or renal disease.  Patient would like to start oral Lamisil  treatment.  Explained that we would check labs every 6 weeks during course of treatment to monitor for any side effects. -Rx: Lamisil  250 mg p.o. daily, refill x 1 -Labs ordered today for liver function tests to monitor for any hepatic side effects from the Lamisil .  Return 6 weeks Lamisil  3 and LFTs

## 2024-10-01 ENCOUNTER — Ambulatory Visit

## 2024-10-04 DIAGNOSIS — F413 Other mixed anxiety disorders: Secondary | ICD-10-CM | POA: Diagnosis not present

## 2024-10-04 DIAGNOSIS — F3181 Bipolar II disorder: Secondary | ICD-10-CM | POA: Diagnosis not present

## 2024-10-07 ENCOUNTER — Other Ambulatory Visit: Payer: Self-pay | Admitting: Surgical

## 2024-10-07 ENCOUNTER — Other Ambulatory Visit: Payer: Self-pay | Admitting: Orthopedic Surgery

## 2024-10-07 DIAGNOSIS — F330 Major depressive disorder, recurrent, mild: Secondary | ICD-10-CM | POA: Diagnosis not present

## 2024-10-08 ENCOUNTER — Ambulatory Visit

## 2024-10-15 ENCOUNTER — Other Ambulatory Visit: Payer: Self-pay | Admitting: Internal Medicine

## 2024-10-15 ENCOUNTER — Ambulatory Visit

## 2024-10-15 DIAGNOSIS — E1159 Type 2 diabetes mellitus with other circulatory complications: Secondary | ICD-10-CM

## 2024-10-22 ENCOUNTER — Ambulatory Visit

## 2024-10-26 ENCOUNTER — Encounter (HOSPITAL_COMMUNITY): Payer: Self-pay | Admitting: Orthopedic Surgery

## 2024-10-27 ENCOUNTER — Other Ambulatory Visit: Payer: Self-pay | Admitting: Surgical

## 2024-10-27 MED ORDER — TRAMADOL HCL 50 MG PO TABS: 50.0000 mg | ORAL_TABLET | Freq: Every day | ORAL | 0 refills | Status: AC | PRN

## 2024-11-04 ENCOUNTER — Other Ambulatory Visit: Payer: Self-pay | Admitting: Internal Medicine

## 2024-11-04 ENCOUNTER — Ambulatory Visit: Payer: Self-pay

## 2024-11-04 DIAGNOSIS — E1169 Type 2 diabetes mellitus with other specified complication: Secondary | ICD-10-CM

## 2024-11-04 NOTE — Telephone Encounter (Signed)
 FYI Only or Action Required?: FYI only for provider: Refusing disposition, rescheduled 4 month follow up.  Patient was last seen in primary care on 05/11/2024 by Vicci Barnie NOVAK, MD.  Called Nurse Triage reporting Vomiting.  Symptoms began several days ago.  Interventions attempted: OTC medications: dramamine and Rest, hydration, or home remedies.  Symptoms are: stable per pt and son.  Triage Disposition: See Physician Within 24 Hours  Patient/caregiver understands and will follow disposition?: No, refuses disposition      Copied from CRM #[id number]. Topic: Clinical - Red Word Triage >> Nov 04, 2024  7:52 AM Ivette P wrote: Red Word that prompted transfer to Nurse Triage: sick with stomach bug and flu. needs to reschedule appt for01/16 - current symtpoms.  Nausea, sweating, throwing up, and feeling bit sweaty. Reason for Disposition  [1] MILD or MODERATE vomiting AND [2] present > 48 hours (2 days)  (Exception: Mild vomiting with associated diarrhea.)  Answer Assessment - Initial Assessment Questions This RN recommended pt be examined in next few hours, pt and son refusing disposition, requesting reschedule 4 month follow up appt, rescheduled to 2/13 as requested. Advised pt/son call back or seek immediate care if any new or worsening symptoms. Sending message to PCP office for call back to pt with further recommendations.     1. VOMITING SEVERITY: How many times have you vomited in the past 24 hours?      Vomited 1x on Monday then next time was last night 1x, comes and goes  2. ONSET: When did the vomiting begin?      Monday  3. FLUIDS: What fluids or food have you vomited up today? Have you been able to keep any fluids down?     Eating and drinking as best possible  4. ABDOMEN PAIN: Are your having any abdomen pain? If Yes : How bad is it and what does it feel like? (e.g., crampy, dull, intermittent, constant)      Denies  6. CONTACTS: Is there anyone else  in the family with the same symptoms?      Denies  7. CAUSE: What do you think is causing your vomiting?     Thinks coming down with regular cold  8. HYDRATION STATUS: Any signs of dehydration? (e.g., dry mouth [not only dry lips], too weak to stand) When did you last urinate?     Denies  9. OTHER SYMPTOMS: Do you have any other symptoms? (e.g., fever, headache, vertigo, vomiting blood or coffee grounds, recent head injury)     Nausea Night sweats Persistent cough several times Headache moderate - interrupted sleep Burning/gritty feeling in her eyes several days   Denies: Weakness Confusion or slurred speech SOB Chest pain Fever BP going up Abdominal pain Dizziness Blood or black material in vomit Current heart racing  Protocols used: Vomiting-A-AH

## 2024-11-05 ENCOUNTER — Ambulatory Visit: Admitting: Internal Medicine

## 2024-11-10 ENCOUNTER — Ambulatory Visit: Admitting: Podiatry

## 2024-11-19 ENCOUNTER — Other Ambulatory Visit: Payer: Self-pay | Admitting: Podiatry

## 2024-11-19 ENCOUNTER — Other Ambulatory Visit: Payer: Self-pay | Admitting: Orthopedic Surgery

## 2024-11-19 MED ORDER — KETOCONAZOLE 2 % EX CREA: TOPICAL_CREAM | CUTANEOUS | 0 refills | Status: AC

## 2024-11-24 ENCOUNTER — Ambulatory Visit: Admitting: Podiatry

## 2024-12-01 ENCOUNTER — Ambulatory Visit: Admitting: Podiatry

## 2024-12-03 ENCOUNTER — Ambulatory Visit: Admitting: Internal Medicine
